# Patient Record
Sex: Female | Born: 1970 | Race: White | Hispanic: No | Smoking: Never smoker
Health system: Southern US, Community
[De-identification: ages and names within clinical notes are randomized; demographics above are authoritative.]

## PROBLEM LIST (undated history)

## (undated) DIAGNOSIS — R519 Headache, unspecified: Secondary | ICD-10-CM

## (undated) DIAGNOSIS — M1990 Unspecified osteoarthritis, unspecified site: Secondary | ICD-10-CM

## (undated) DIAGNOSIS — E079 Disorder of thyroid, unspecified: Secondary | ICD-10-CM

## (undated) DIAGNOSIS — N83209 Unspecified ovarian cyst, unspecified side: Secondary | ICD-10-CM

## (undated) DIAGNOSIS — D649 Anemia, unspecified: Secondary | ICD-10-CM

## (undated) DIAGNOSIS — Z8669 Personal history of other diseases of the nervous system and sense organs: Secondary | ICD-10-CM

## (undated) DIAGNOSIS — E785 Hyperlipidemia, unspecified: Secondary | ICD-10-CM

## (undated) DIAGNOSIS — R160 Hepatomegaly, not elsewhere classified: Secondary | ICD-10-CM

## (undated) DIAGNOSIS — F419 Anxiety disorder, unspecified: Secondary | ICD-10-CM

## (undated) DIAGNOSIS — K219 Gastro-esophageal reflux disease without esophagitis: Secondary | ICD-10-CM

## (undated) HISTORY — PX: HYSTEROSCOPY W/ ENDOMETRIAL ABLATION: SUR665

## (undated) HISTORY — PX: ABDOMINAL HYSTERECTOMY: SHX81

## (undated) HISTORY — DX: Disorder of thyroid, unspecified: E07.9

## (undated) HISTORY — PX: WISDOM TOOTH EXTRACTION: SHX21

## (undated) HISTORY — PX: CARPAL TUNNEL RELEASE: SHX101

## (undated) HISTORY — PX: BACK SURGERY: SHX140

## (undated) HISTORY — PX: OTHER SURGICAL HISTORY: SHX169

## (undated) HISTORY — PX: TUBAL LIGATION: SHX77

## (undated) HISTORY — PX: KNEE SURGERY: SHX244

## (undated) HISTORY — PX: ELBOW SURGERY: SHX618

## (undated) HISTORY — DX: Hyperlipidemia, unspecified: E78.5

## (undated) HISTORY — DX: Personal history of other diseases of the nervous system and sense organs: Z86.69

## (undated) HISTORY — DX: Anxiety disorder, unspecified: F41.9

## (undated) SURGERY — Surgical Case
Anesthesia: *Unknown

---

## 1998-10-28 ENCOUNTER — Emergency Department (HOSPITAL_COMMUNITY): Admission: EM | Admit: 1998-10-28 | Discharge: 1998-10-28 | Payer: Self-pay | Admitting: Emergency Medicine

## 1998-12-24 ENCOUNTER — Emergency Department (HOSPITAL_COMMUNITY): Admission: EM | Admit: 1998-12-24 | Discharge: 1998-12-25 | Payer: Self-pay | Admitting: Emergency Medicine

## 1999-07-23 ENCOUNTER — Encounter: Payer: Self-pay | Admitting: Emergency Medicine

## 1999-07-23 ENCOUNTER — Emergency Department (HOSPITAL_COMMUNITY): Admission: EM | Admit: 1999-07-23 | Discharge: 1999-07-23 | Payer: Self-pay | Admitting: Emergency Medicine

## 2001-02-12 ENCOUNTER — Inpatient Hospital Stay (HOSPITAL_COMMUNITY): Admission: AD | Admit: 2001-02-12 | Discharge: 2001-02-12 | Payer: Self-pay | Admitting: Obstetrics & Gynecology

## 2001-05-12 ENCOUNTER — Emergency Department (HOSPITAL_COMMUNITY): Admission: EM | Admit: 2001-05-12 | Discharge: 2001-05-12 | Payer: Self-pay | Admitting: Emergency Medicine

## 2001-05-12 ENCOUNTER — Encounter: Payer: Self-pay | Admitting: Emergency Medicine

## 2001-05-30 ENCOUNTER — Encounter: Payer: Self-pay | Admitting: Emergency Medicine

## 2001-05-30 ENCOUNTER — Emergency Department (HOSPITAL_COMMUNITY): Admission: EM | Admit: 2001-05-30 | Discharge: 2001-05-30 | Payer: Self-pay

## 2002-09-05 ENCOUNTER — Emergency Department (HOSPITAL_COMMUNITY): Admission: EM | Admit: 2002-09-05 | Discharge: 2002-09-05 | Payer: Self-pay | Admitting: Emergency Medicine

## 2003-01-03 ENCOUNTER — Other Ambulatory Visit: Admission: RE | Admit: 2003-01-03 | Discharge: 2003-01-03 | Payer: Self-pay | Admitting: Obstetrics and Gynecology

## 2004-01-08 ENCOUNTER — Other Ambulatory Visit: Admission: RE | Admit: 2004-01-08 | Discharge: 2004-01-08 | Payer: Self-pay | Admitting: Obstetrics and Gynecology

## 2004-02-05 ENCOUNTER — Encounter: Admission: RE | Admit: 2004-02-05 | Discharge: 2004-02-05 | Payer: Self-pay | Admitting: Orthopaedic Surgery

## 2004-02-25 ENCOUNTER — Encounter: Admission: RE | Admit: 2004-02-25 | Discharge: 2004-02-25 | Payer: Self-pay | Admitting: Orthopaedic Surgery

## 2004-02-28 ENCOUNTER — Emergency Department (HOSPITAL_COMMUNITY): Admission: EM | Admit: 2004-02-28 | Discharge: 2004-02-28 | Payer: Self-pay | Admitting: Emergency Medicine

## 2004-02-28 IMAGING — CT CT HEAD W/O CM
1 series · 16 of 30 positions shown, 20 images · non-contrast
Comparison: none

CLINICAL DATA: Neck pain, fever.  Severe headache, neck stiffness.  Fever for four days.  Pre LP.
CT HEAD WITHOUT CONTRAST
Images likely within normal limits although one might initially get the impression that there is subcortical edema right posterior frontal region.  I feel that this is probably due to the effects of partial volume averaging of CSF in the sulci simulating cortical or subcortical edema.  Minute low density focus, right posterior centrum semiovale may represent dilated perivascular space or potentially a remote lacunar infarction.  If this is a lesion, it may be a result of early small vessel disease, migraines, amongst other possibilities.  
IMPRESSION
No intracranial hemorrhage or hydrocephalus.  Patient is going to have a lumbar puncture.  Query small remote lacunar infarction, right posterior parietal deep white matter.  Mild suspicion for cortical or subcortical edema, right posterior frontal.  Pending the LP findings, consider MRI of the brain for further assessment.

[Series 2: brain · axial · 0.47mm/px · z∈[+136,+261]mm · 16 of 34 slices shown, 20 images]
[im 2/34  brain]
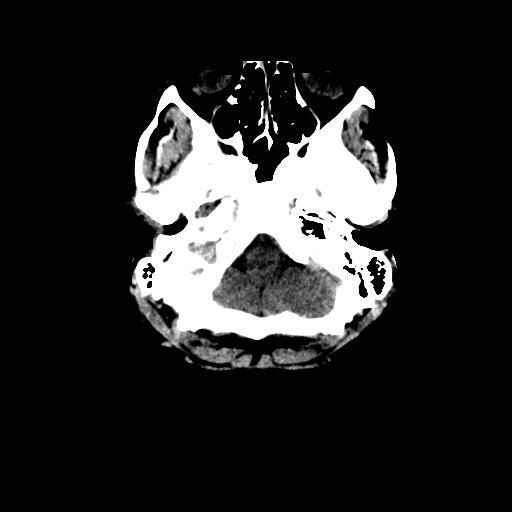
[im 2/34  bone]
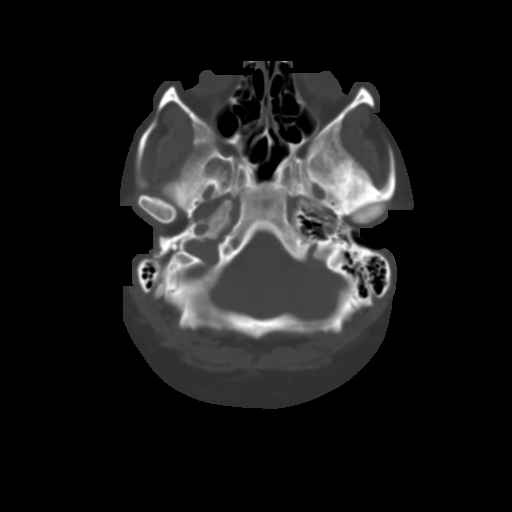
[im 4/34  brain]
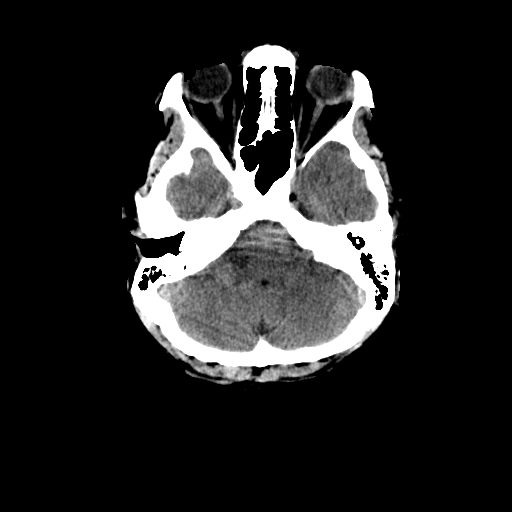
[im 6/34  brain]
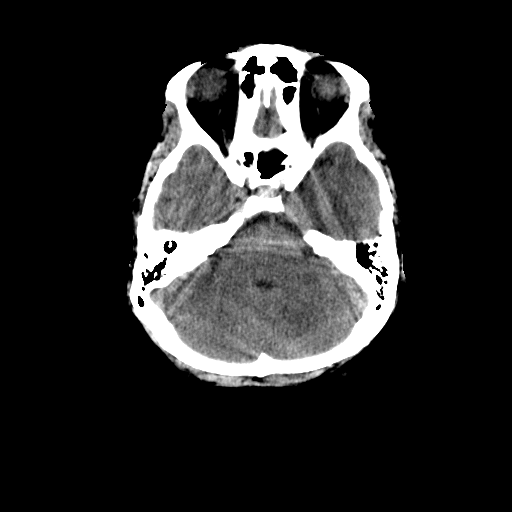
[im 8/34  brain]
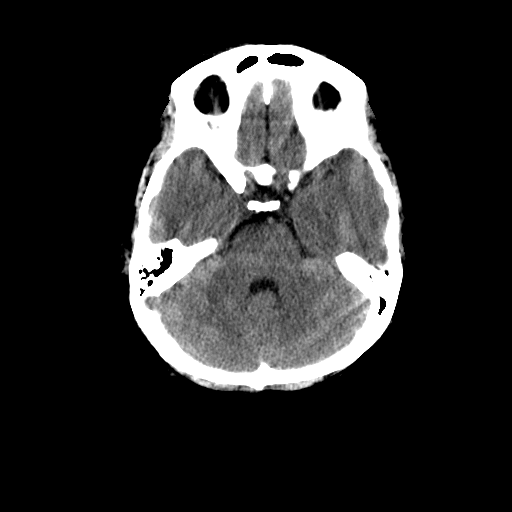
[im 10/34  brain]
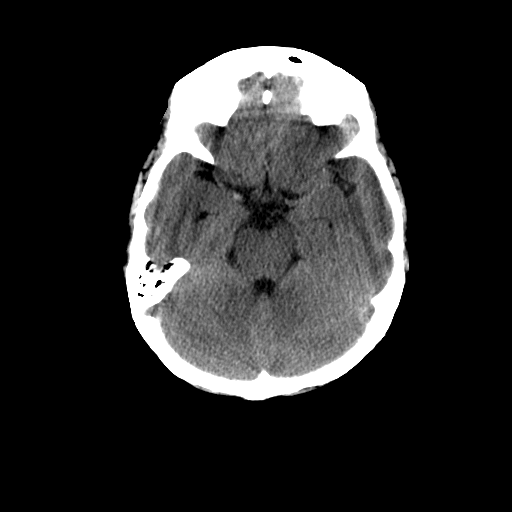
[im 10/34  bone]
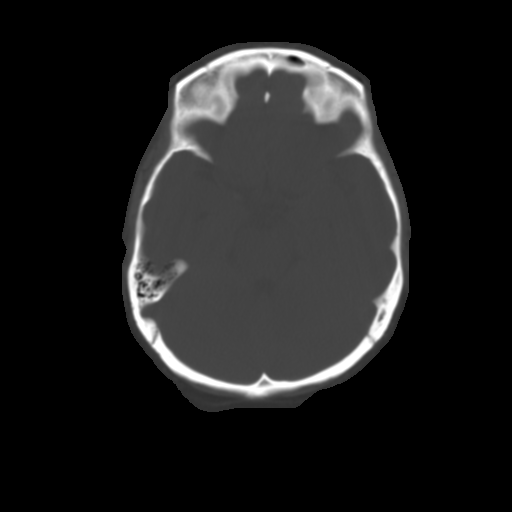
[im 12/34  brain]
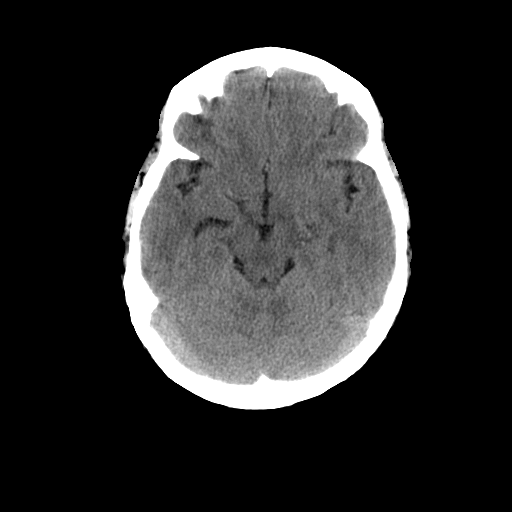
[im 14/34  brain]
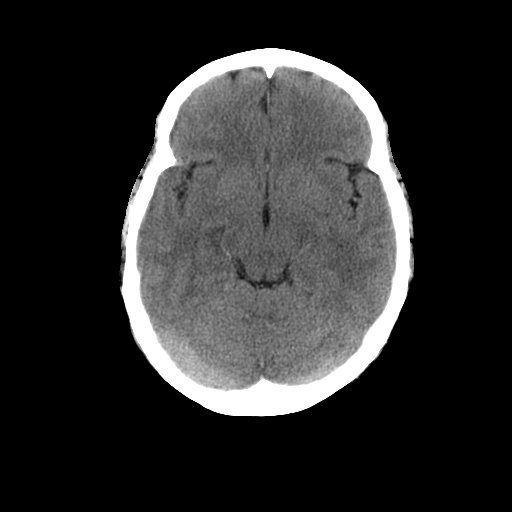
[im 16/34  brain]
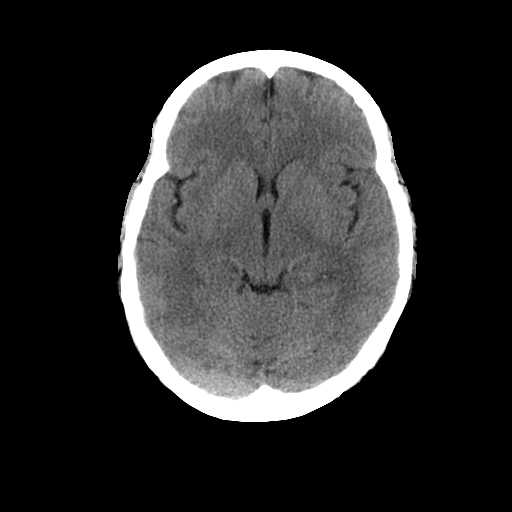
[im 18/34  brain]
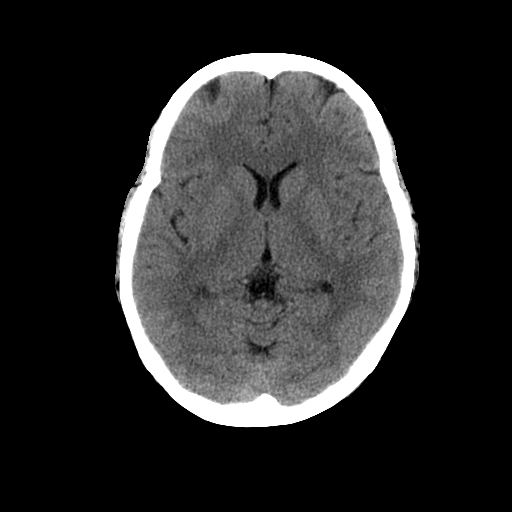
[im 18/34  bone]
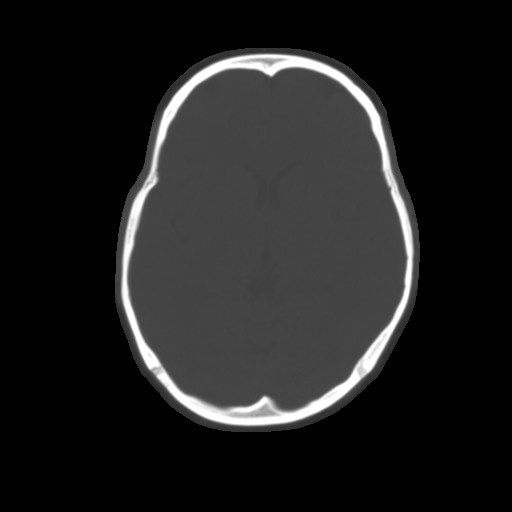
[im 20/34  brain]
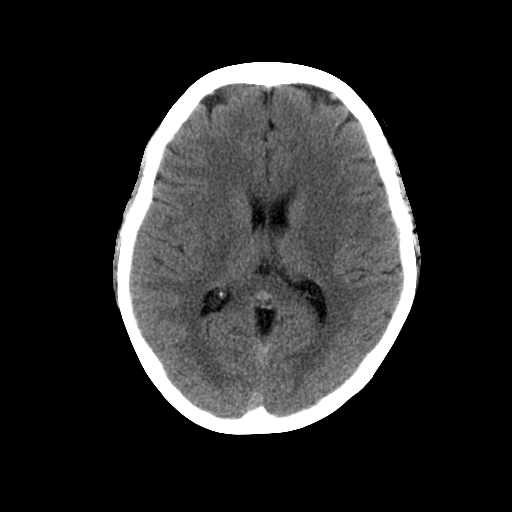
[im 22/34  brain]
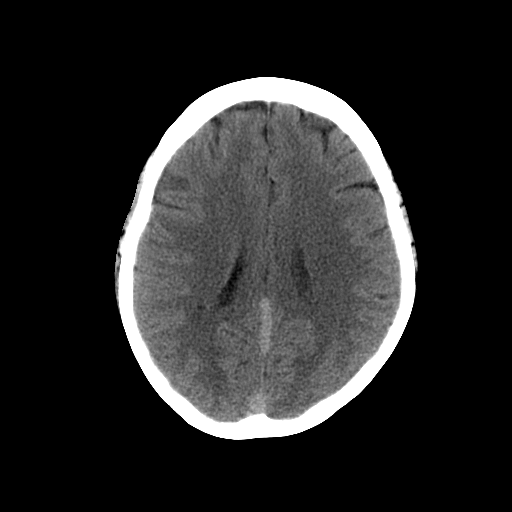
[im 24/34  brain]
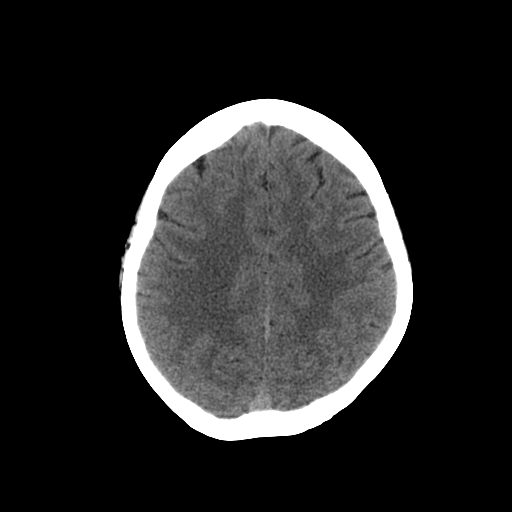
[im 26/34  brain]
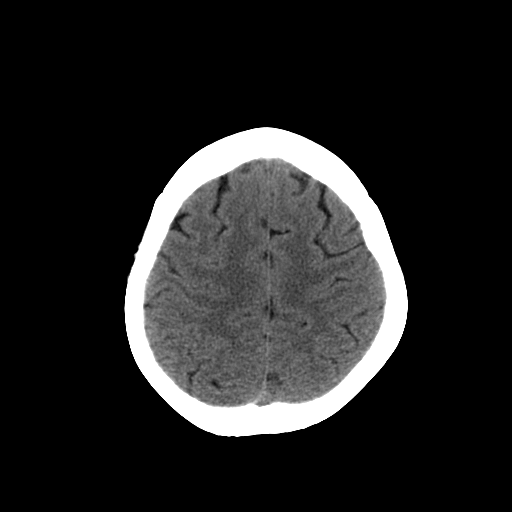
[im 26/34  bone]
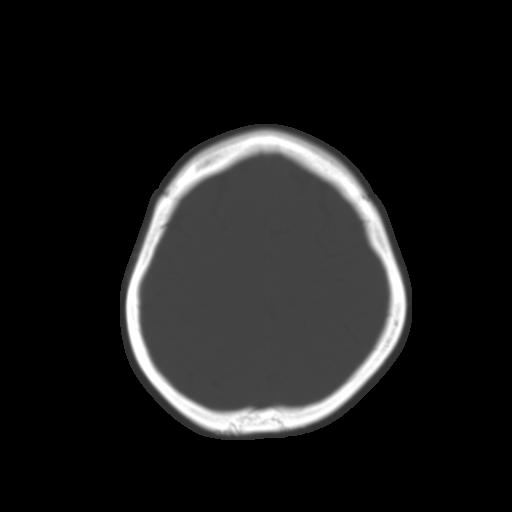
[im 28/34  brain]
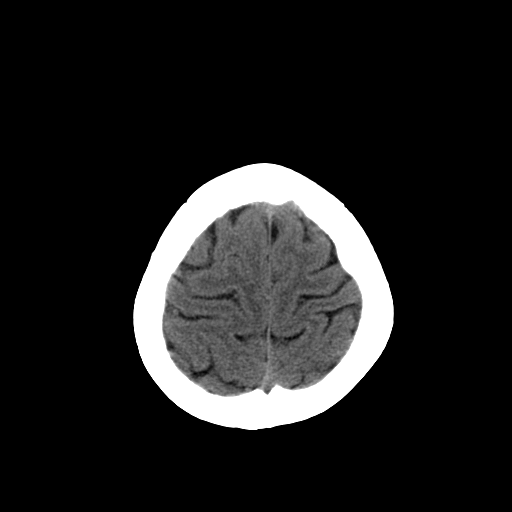
[im 30/34  brain]
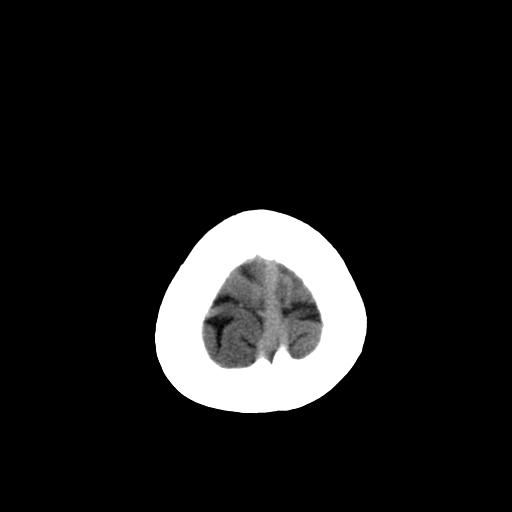
[im 32/34  brain]
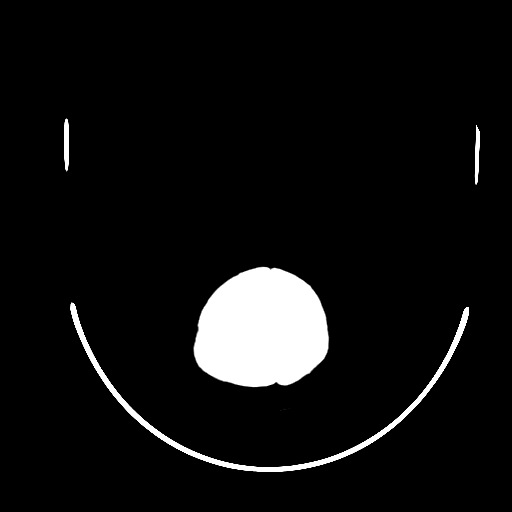

[16 of 30 positions shown; findings below may reference images not displayed]

## 2004-09-17 ENCOUNTER — Ambulatory Visit (HOSPITAL_COMMUNITY): Admission: RE | Admit: 2004-09-17 | Discharge: 2004-09-17 | Payer: Self-pay | Admitting: Orthopaedic Surgery

## 2004-09-24 ENCOUNTER — Encounter: Admission: RE | Admit: 2004-09-24 | Discharge: 2004-09-24 | Payer: Self-pay | Admitting: Orthopaedic Surgery

## 2004-09-24 IMAGING — RF DG DISKOGRAPHY LUMBAR S+I
7 series · 7 of 7 positions shown · IV contrast (omnipaque)
Comparison: none

CLINICAL DATA: Back pain.
LUMBAR DISKOGRAPHY AND POST-DISCOGRAM CT:
LUMBAR DISKOGRAM
The patient was given extensive informed consent including the risk of pain, infection, spinal fluid leak, and neurologic deficit.  Specifically, the risk of infection including diskitis and osteomyelitis was discussed with the patient, who agreed to proceed.  
The patient was given 1 gram Ancef IV thirty minutes prior to the procedure.  1 cc of Ancef was added to 20 cc of Omnipaque 180 used for injection. The back was prepared with a sterile scrub sponge for five minutes followed by copious application of Betadine solution.  Sterile drapes were applied and strict sterile technique was used by everyone in the room.  A right paraspinous approach was taken. 20 cm needles were employed both at L4-5 and L5-S1 due to the patient?s morbid obesity.  Individual disk spaces were examined as follows:
L4-5:  Opening pressure 10 PSI.  Pressure at pain response 50 PSI.  Quantity of contrast 3 ml.  Level of pain was [DATE] on a scale of 1 to 10.  She experienced pain extending from her low back into her right side. This was similar to the pain she experiences at home.  
L5-S1:  Opening pressure 50 PSI.  Pressure at pain response 50 PSI.  Quantity of contrast 3 ml.  Level of pain was 10 on a scale of 1 to 10.  She experienced pain extending into her right leg and calf.  This is different from what she experiences at home.  
The disk at L4-5 was morphologically abnormal extending from with an annular rent extending posteriorly into the canal as well as anteriorly into retroperitoneum. The disk at L5-S1 appeared relatively  normal morphologically.

[Series 1: discogram · 1 of 1 slices shown (1 of 7)]
[im 1/1]
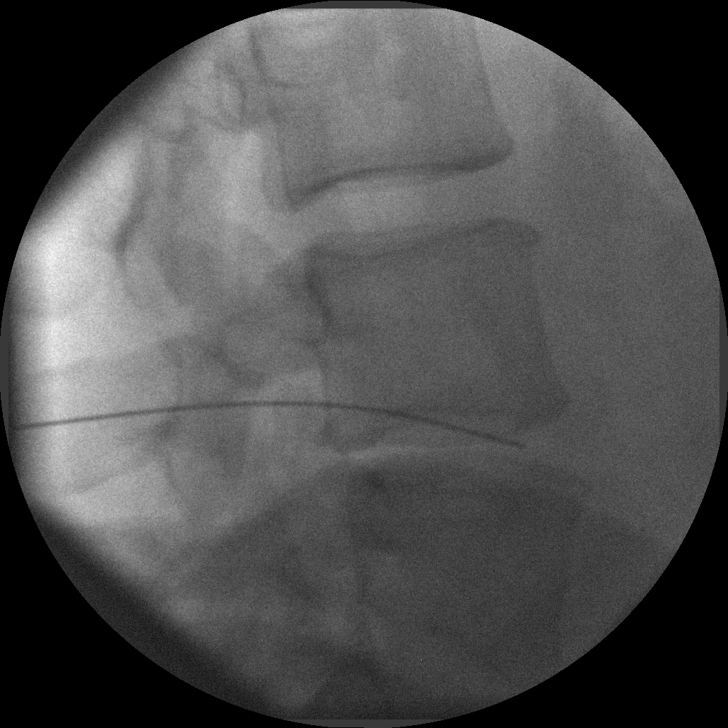

[Series 2: discogram · 1 of 1 slices shown (2 of 7)]
[im 1/1]
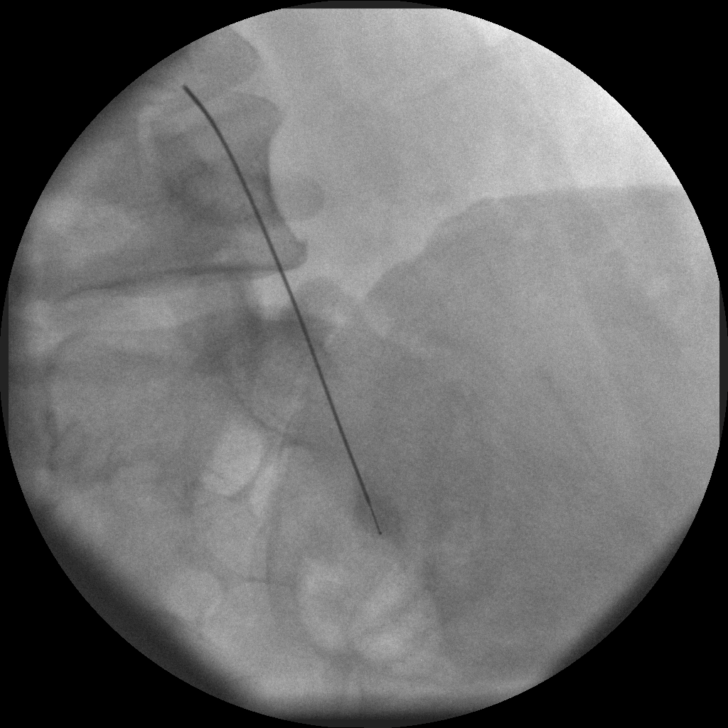

[Series 5: discogram · 1 of 1 slices shown (3 of 7)]
[im 1/1]
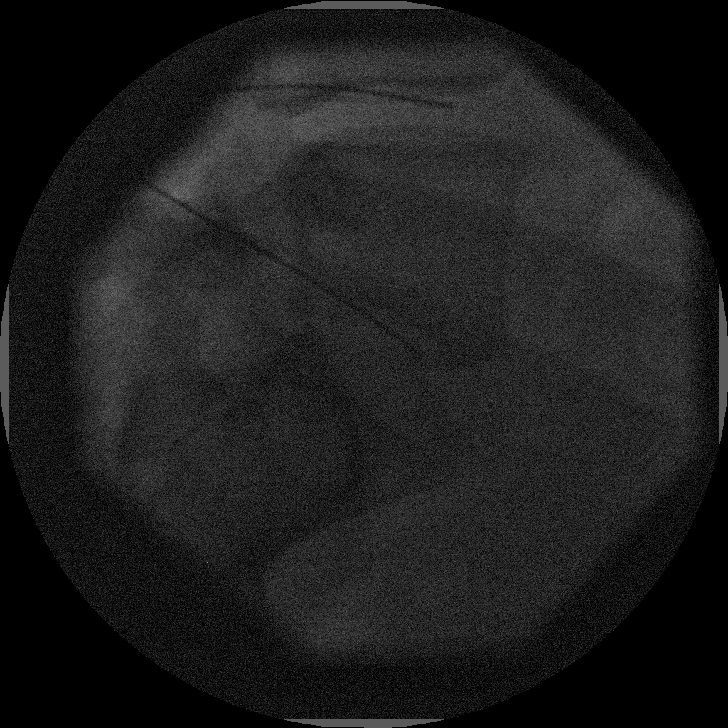

[Series 7: discogram · 1 of 1 slices shown (4 of 7)]
[im 1/1]
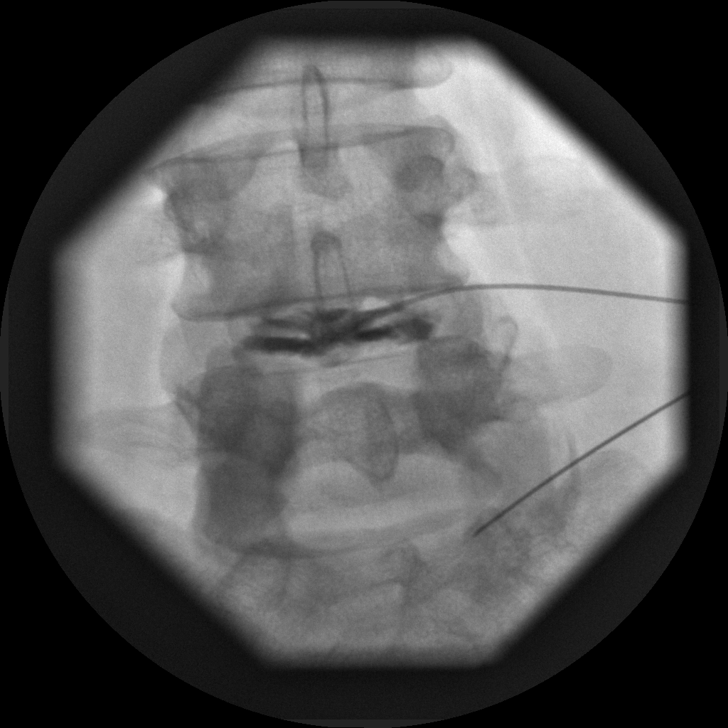

[Series 8: discogram · 1 of 1 slices shown (5 of 7)]
[im 1/1]
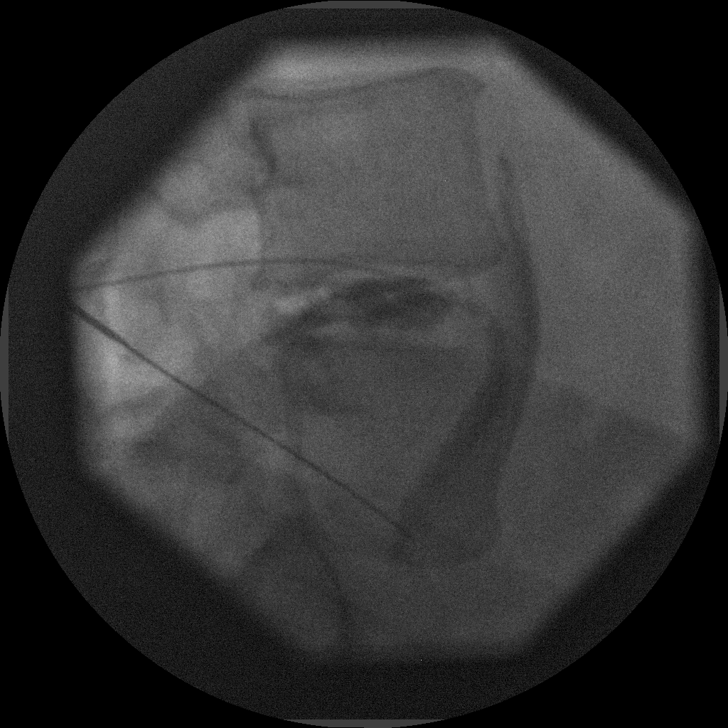

[Series 9: discogram · 1 of 1 slices shown (6 of 7)]
[im 1/1]
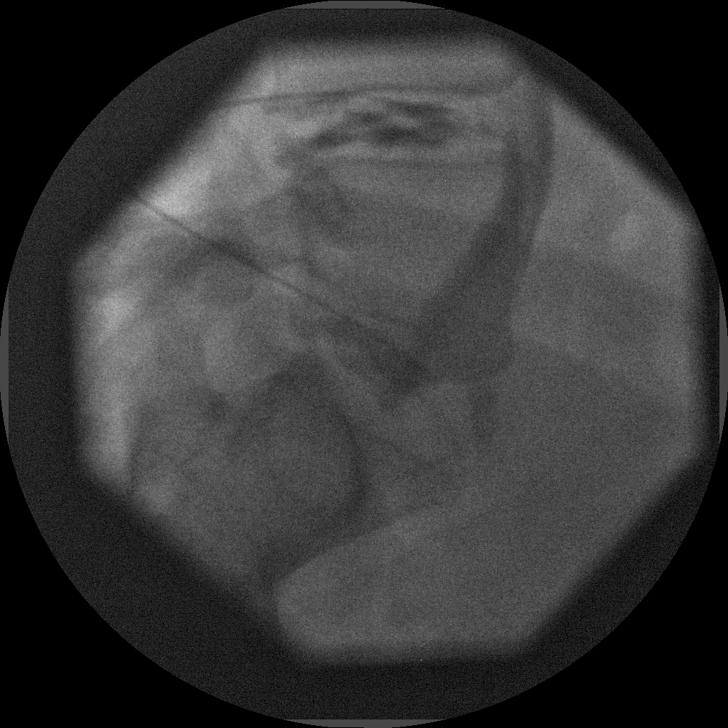

[Series 10: discogram · 1 of 1 slices shown (7 of 7)]
[im 1/1]
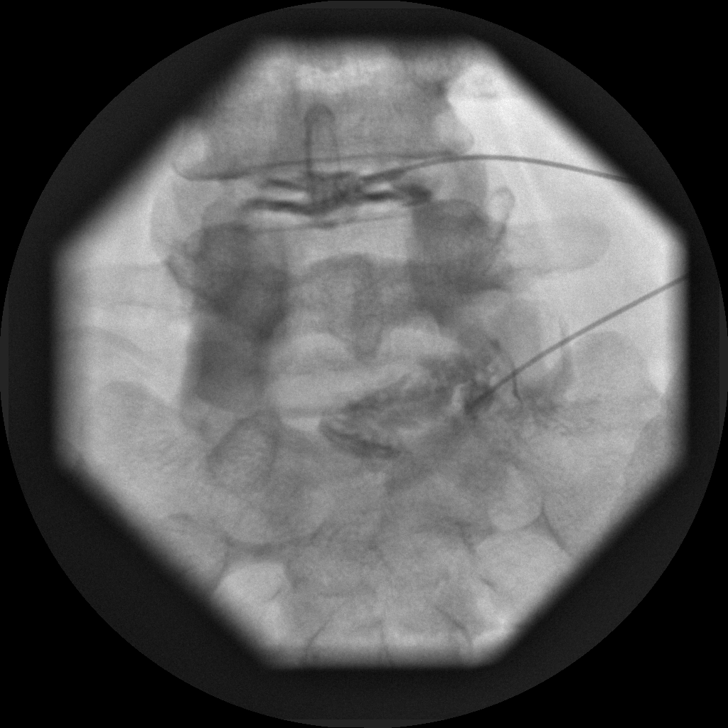

[7 of 7 positions shown; findings below may reference images not displayed]

IMPRESSION: 1.  Lumbar diskogram at L4-5 reproduced back and right hip pain although it did not reproduce back and left leg pain.  
2.  There was discordant pain at L5-S1 with some radiation to the right calf. 
POST DISKOGRAM CT:  
L4-5:  There is annular rent central and to the right associated with vacuum disk phenomenon.  Right L4 and right L5 nerve root encroachment are present.  Contrast extrusion into the retroperitoneum is seen posterior to the aorta. 
L5-S1:  There is some annular filling which is probably related to needle placement which is fairly difficult in this patient.  Central nuclear material, however, shows no obvious annular rent. 
There is disk space narrowing and osteophyte formation at L4-5. There may be very mild anterolisthesis of L5 forward on L4 and S1.  Far right and left parasagittal images suggest a pars interarticularis defect at L5 on the left.  There is also moderate facet arthropathy which most likely accounts for the patient?s mild slip.
IMPRESSION: 1.  ositive lumbar diskography at L4-5 with reproduction of the patient?s back and right hip pain; there is contrast extrusion into the canal and into foramen with right L4 and right L5 nerve root encroachment.
2.  The L5-S1 disk appears relatively normal.  
  Lower lumbar facet arthropathy worst at L4-5 associated with a minimal pars defect at L5 on the left. 
CT MULTIPLANAR RECONSTRUCTION:
Multiplanar reformatted CT images were reconstructed from the axial CT data set. These images were reviewed and pertinent findings are included in the accompanying complete CT report. 

IMPRESSION

See complete CT report.

## 2004-09-24 IMAGING — CT CT RECONSTRUCTION
3 of 7 series · 12 of 33 positions shown, 14 images · IV contrast (omnipaque)
Comparison: none

CLINICAL DATA: Back pain.
LUMBAR DISKOGRAPHY AND POST-DISCOGRAM CT:
LUMBAR DISKOGRAM
The patient was given extensive informed consent including the risk of pain, infection, spinal fluid leak, and neurologic deficit.  Specifically, the risk of infection including diskitis and osteomyelitis was discussed with the patient, who agreed to proceed.  
The patient was given 1 gram Ancef IV thirty minutes prior to the procedure.  1 cc of Ancef was added to 20 cc of Omnipaque 180 used for injection. The back was prepared with a sterile scrub sponge for five minutes followed by copious application of Betadine solution.  Sterile drapes were applied and strict sterile technique was used by everyone in the room.  A right paraspinous approach was taken. 20 cm needles were employed both at L4-5 and L5-S1 due to the patient?s morbid obesity.  Individual disk spaces were examined as follows:
L4-5:  Opening pressure 10 PSI.  Pressure at pain response 50 PSI.  Quantity of contrast 3 ml.  Level of pain was [DATE] on a scale of 1 to 10.  She experienced pain extending from her low back into her right side. This was similar to the pain she experiences at home.  
L5-S1:  Opening pressure 50 PSI.  Pressure at pain response 50 PSI.  Quantity of contrast 3 ml.  Level of pain was 10 on a scale of 1 to 10.  She experienced pain extending into her right leg and calf.  This is different from what she experiences at home.  
The disk at L4-5 was morphologically abnormal extending from with an annular rent extending posteriorly into the canal as well as anteriorly into retroperitoneum. The disk at L5-S1 appeared relatively  normal morphologically.

[Series 4: recon 3: l-spine helical · axial · 0.27mm/px · z∈[-47,+1]mm · 4 of 65 slices shown, 5 images]
[im 13/65  soft-tissue]
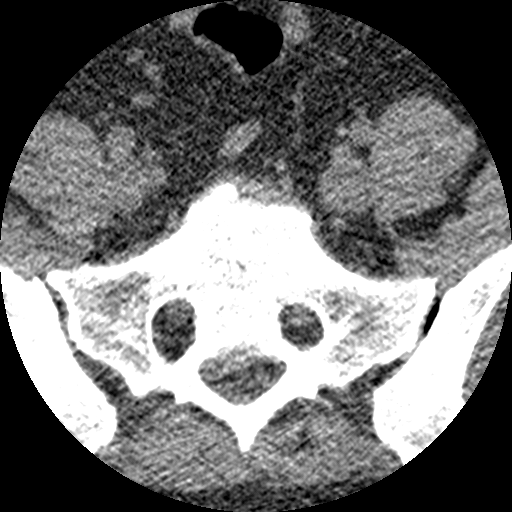
[im 13/65  bone]
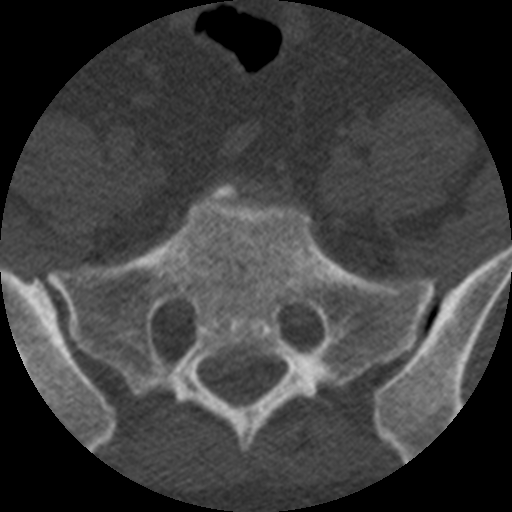
[im 26/65  bone]
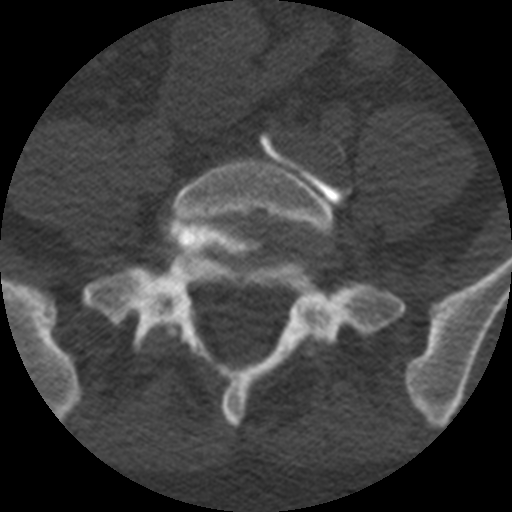
[im 39/65  bone]
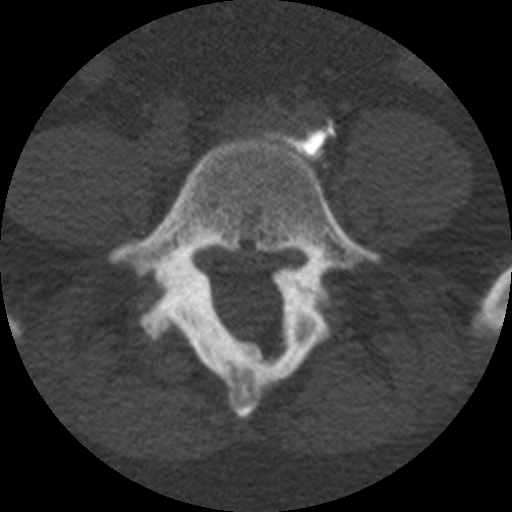
[im 52/65  bone]
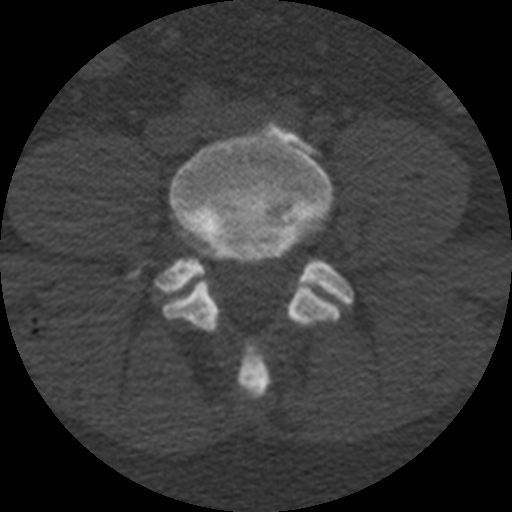

[Series 400: reformatted · sagittal · 0.23mm/px · 5 of 40 slices shown, 6 images (1 of 2)]
[im 14/40  bone]
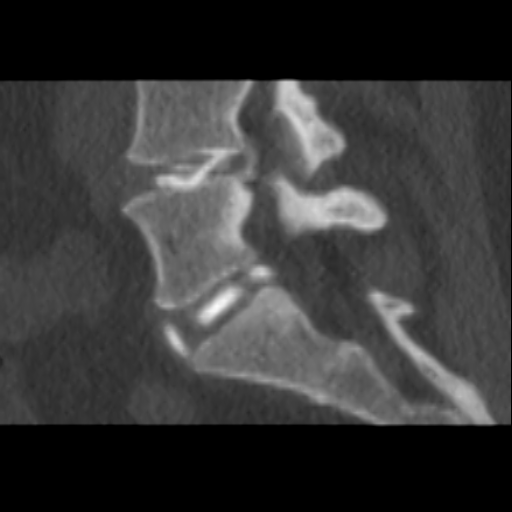
[im 17/40  bone]
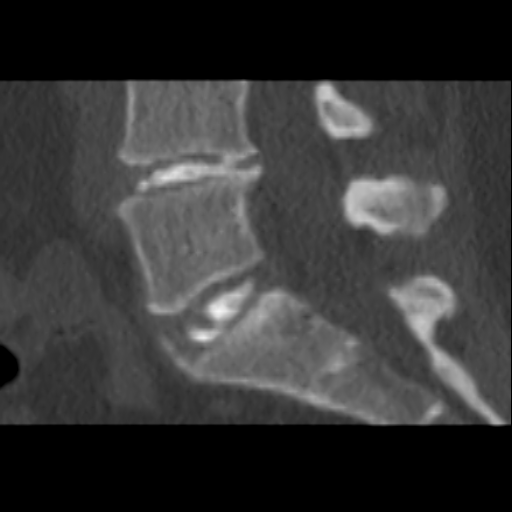
[im 20/40  soft-tissue]
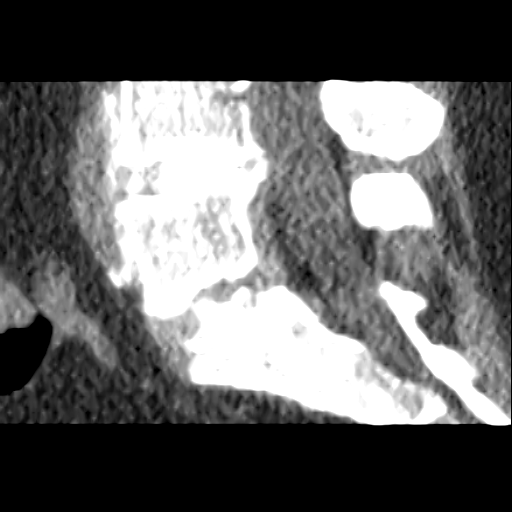
[im 20/40  bone]
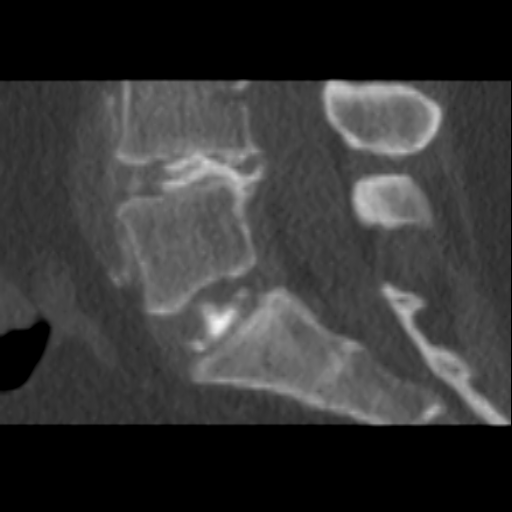
[im 23/40  bone]
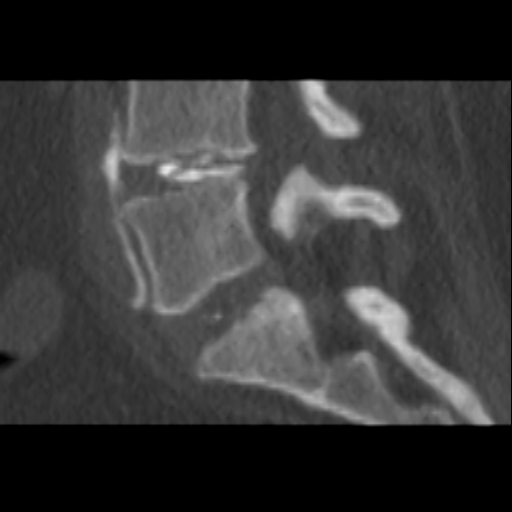
[im 27/40  bone]
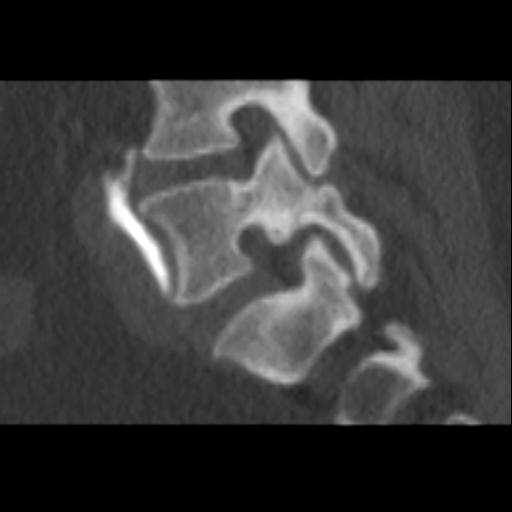

[Series 401: reformatted · coronal · 0.27mm/px · 3 of 40 slices shown (2 of 2)]
[im 8/40  bone]
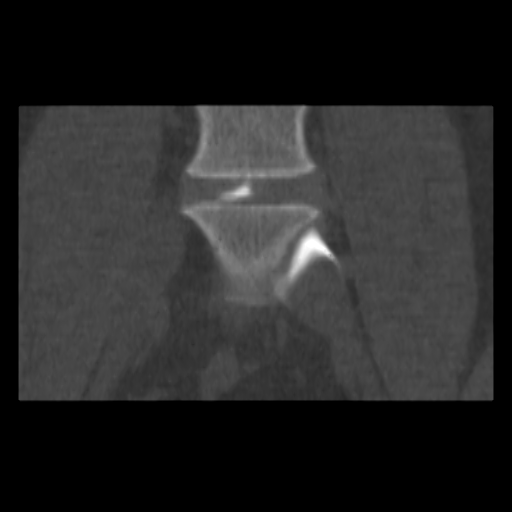
[im 16/40  bone]
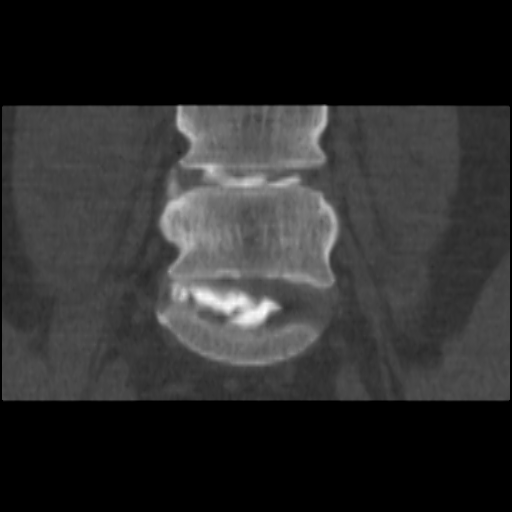
[im 24/40  bone]
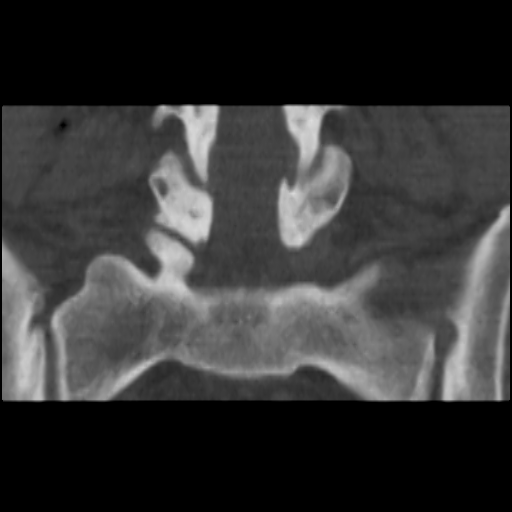

[12 of 33 positions shown; findings below may reference images not displayed]

IMPRESSION: 1.  Lumbar diskogram at L4-5 reproduced back and right hip pain although it did not reproduce back and left leg pain.  
2.  There was discordant pain at L5-S1 with some radiation to the right calf. 
POST DISKOGRAM CT:  
L4-5:  There is annular rent central and to the right associated with vacuum disk phenomenon.  Right L4 and right L5 nerve root encroachment are present.  Contrast extrusion into the retroperitoneum is seen posterior to the aorta. 
L5-S1:  There is some annular filling which is probably related to needle placement which is fairly difficult in this patient.  Central nuclear material, however, shows no obvious annular rent. 
There is disk space narrowing and osteophyte formation at L4-5. There may be very mild anterolisthesis of L5 forward on L4 and S1.  Far right and left parasagittal images suggest a pars interarticularis defect at L5 on the left.  There is also moderate facet arthropathy which most likely accounts for the patient?s mild slip.
IMPRESSION: 1.  ositive lumbar diskography at L4-5 with reproduction of the patient?s back and right hip pain; there is contrast extrusion into the canal and into foramen with right L4 and right L5 nerve root encroachment.
2.  The L5-S1 disk appears relatively normal.  
  Lower lumbar facet arthropathy worst at L4-5 associated with a minimal pars defect at L5 on the left. 
CT MULTIPLANAR RECONSTRUCTION:
Multiplanar reformatted CT images were reconstructed from the axial CT data set. These images were reviewed and pertinent findings are included in the accompanying complete CT report. 

IMPRESSION

See complete CT report.

## 2004-10-17 ENCOUNTER — Emergency Department (HOSPITAL_COMMUNITY): Admission: EM | Admit: 2004-10-17 | Discharge: 2004-10-17 | Payer: Self-pay | Admitting: Emergency Medicine

## 2005-01-11 ENCOUNTER — Other Ambulatory Visit: Admission: RE | Admit: 2005-01-11 | Discharge: 2005-01-11 | Payer: Self-pay | Admitting: Obstetrics and Gynecology

## 2005-11-05 ENCOUNTER — Emergency Department (HOSPITAL_COMMUNITY): Admission: EM | Admit: 2005-11-05 | Discharge: 2005-11-05 | Payer: Self-pay | Admitting: Emergency Medicine

## 2006-01-21 ENCOUNTER — Emergency Department: Payer: Self-pay | Admitting: Emergency Medicine

## 2006-01-21 IMAGING — CR RIGHT ANKLE - COMPLETE 3+ VIEW
1 series · 5 of 5 positions shown · non-contrast
Comparison: none

REASON FOR EXAM: injury
COMMENTS:

PROCEDURE:     DXR - DXR ANKLE RIGHT COMPLETE  - [DATE]  [DATE]
RESULT:     There does not appear to be evidence of fracture, dislocation,
or malalignment. No soft tissue swelling is appreciated.

[Series 1: view not recorded · 0.17mm/px · 5 of 5 slices shown]
[im 1/5]
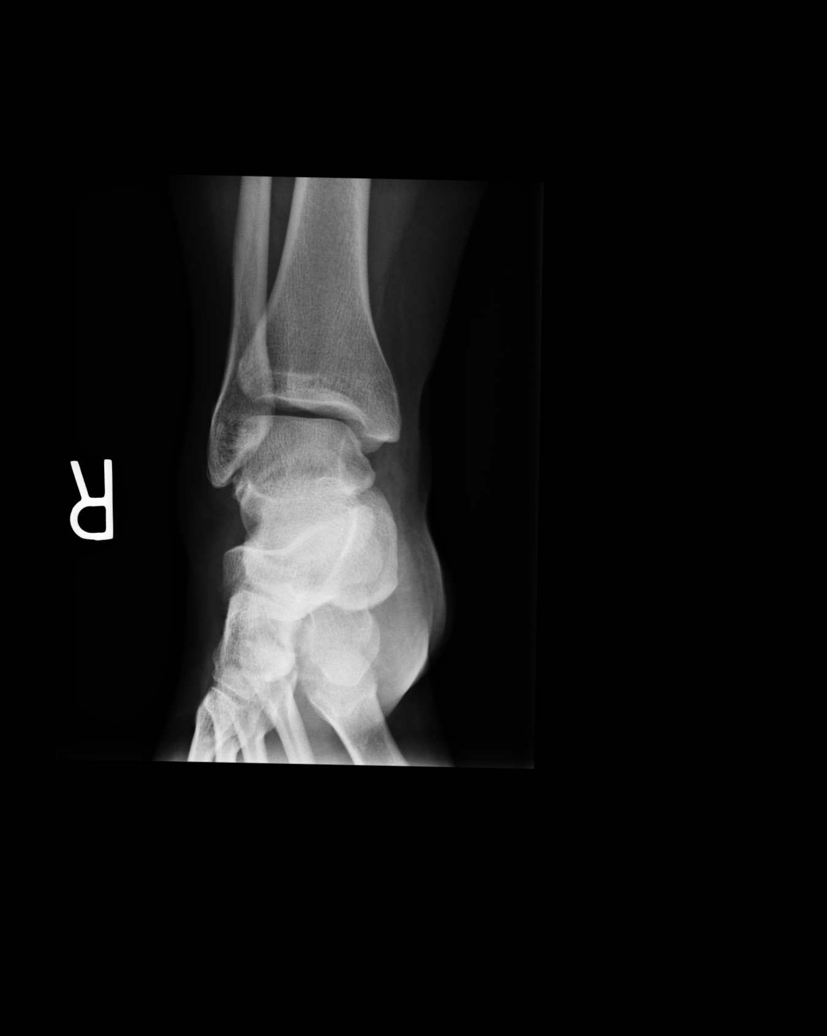
[im 2/5]
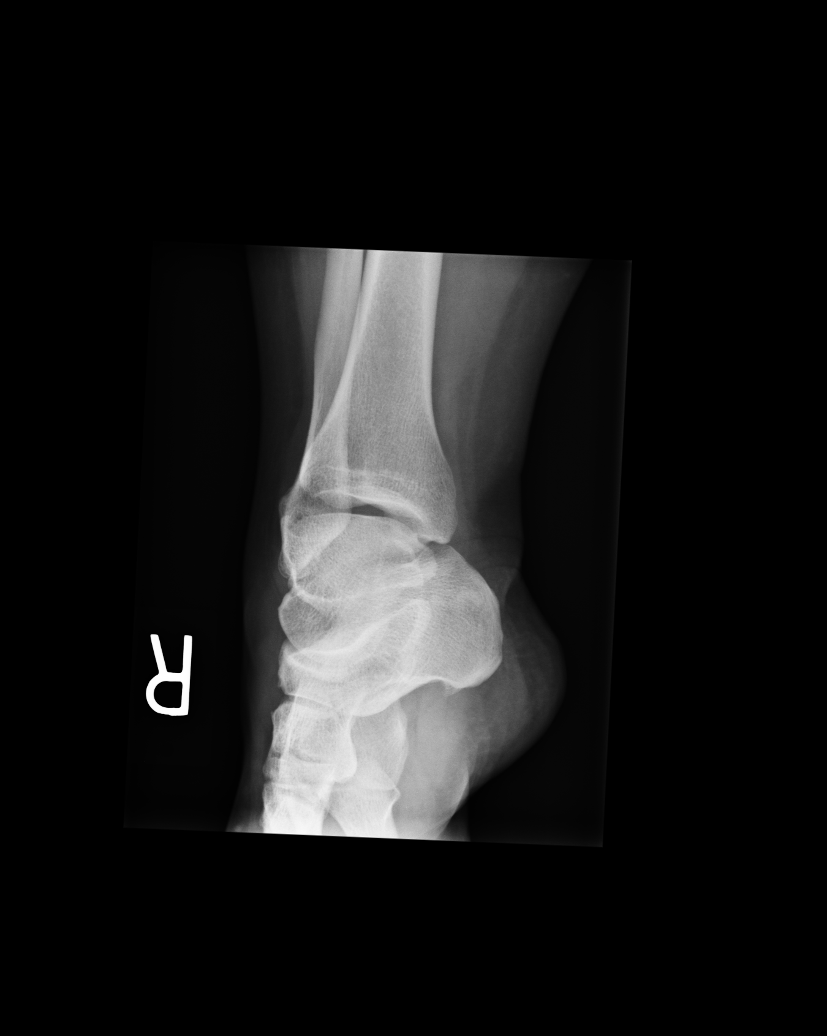
[im 3/5]
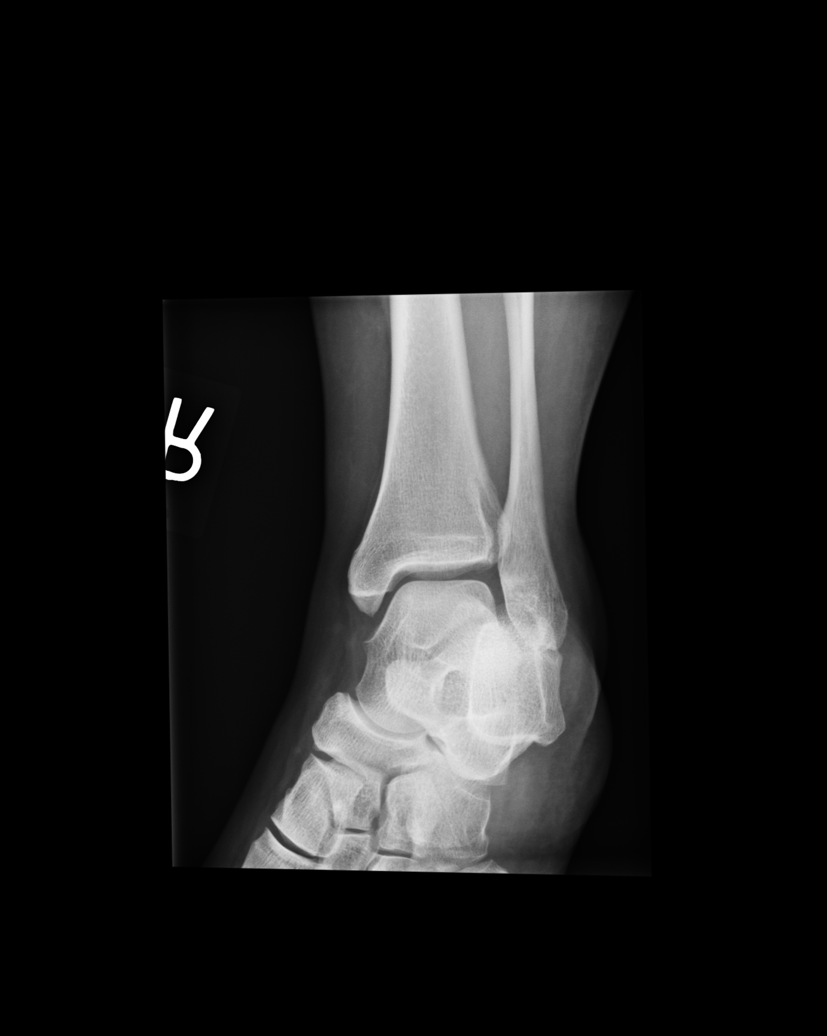
[im 4/5]
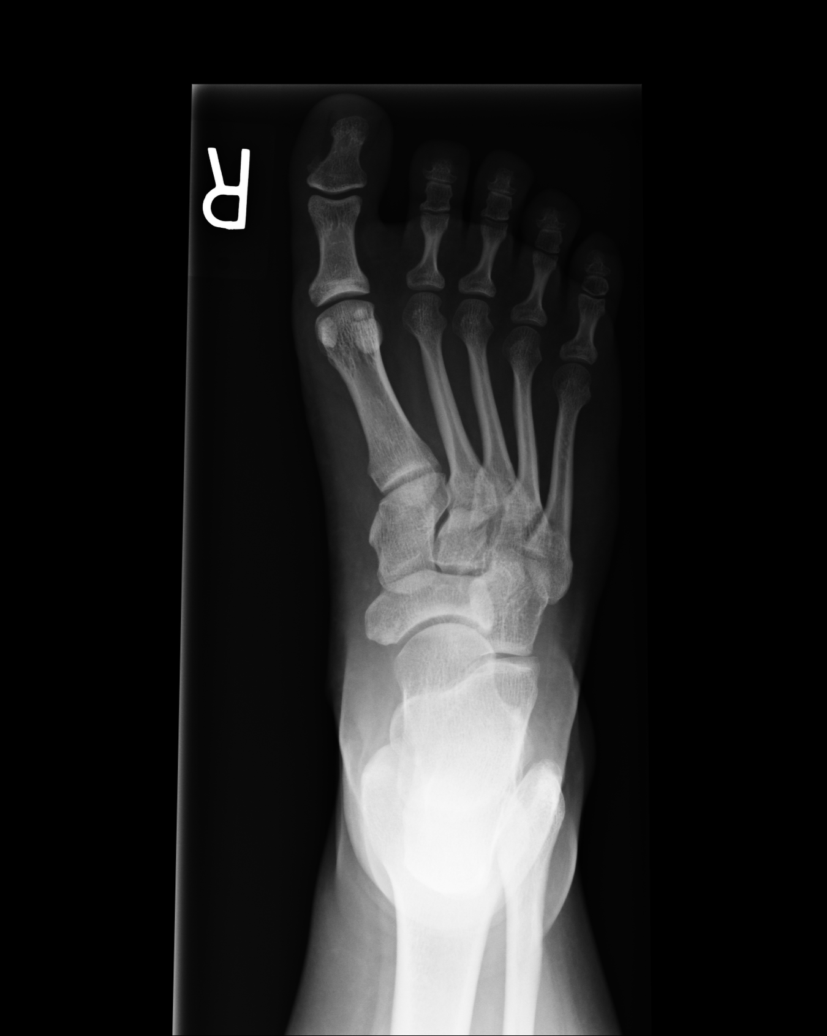
[im 5/5]
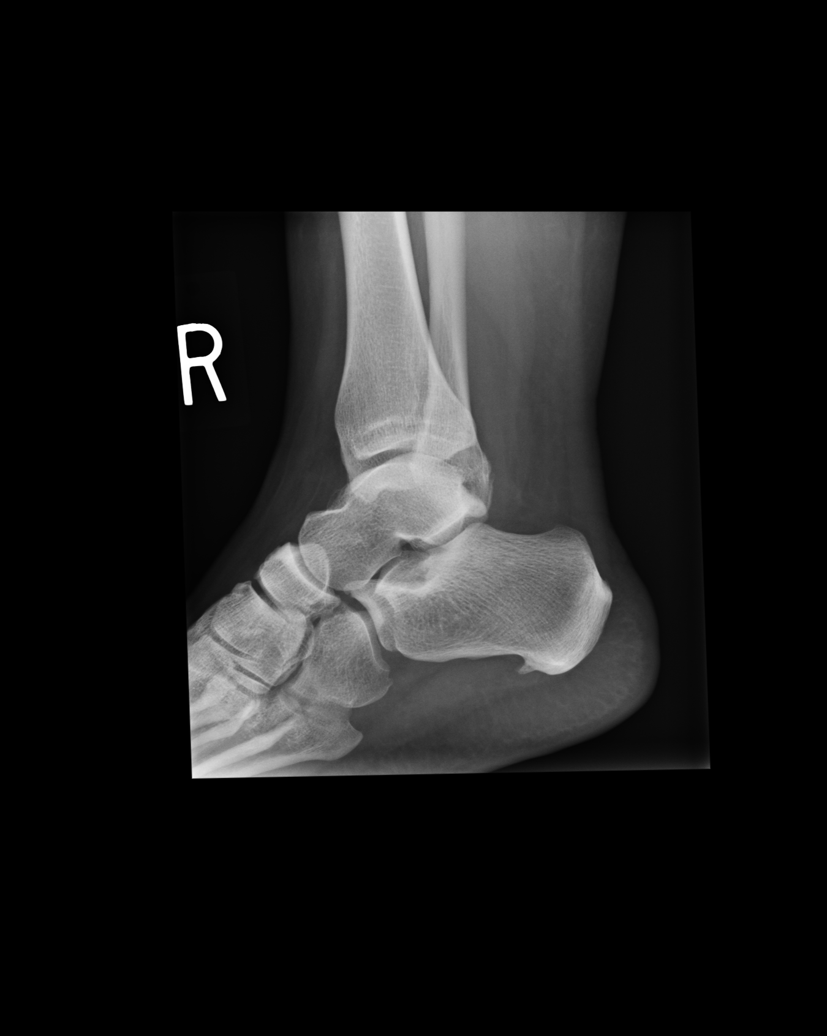

[5 of 5 positions shown; findings below may reference images not displayed]

IMPRESSION: 1)No evidence of acute abnormalities.

2)If there is persistent clinical concern or persistent complaints of pain,
repeat evaluation in 7-10 days is recommended if clinically warranted.

## 2007-01-12 ENCOUNTER — Emergency Department (HOSPITAL_COMMUNITY): Admission: EM | Admit: 2007-01-12 | Discharge: 2007-01-12 | Payer: Self-pay | Admitting: Emergency Medicine

## 2007-01-12 IMAGING — CT CT HEAD W/O CM
5 of 6 series · 18 of 47 positions shown, 19 images · IV contrast (agent unspecified)
Comparison: [DATE].
COMPARISON: No comparison films available.

CLINICAL DATA: Left head and neck injury with pain.  
 HEAD CT WITHOUT CONTRAST:
TECHNIQUE: Contiguous axial images were obtained from the base of the skull through the vertex according to standard protocol without contrast.
TECHNIQUE: Multidetector CT imaging of the cervical spine was performed.  Multiplanar CT image reconstructions were also generated.

[Series 3: head trauma 4.8 h47s · axial · 0.41mm/px · z∈[-180,-32]mm · 3 of 30 slices shown, 4 images]
[im 1/30  brain]
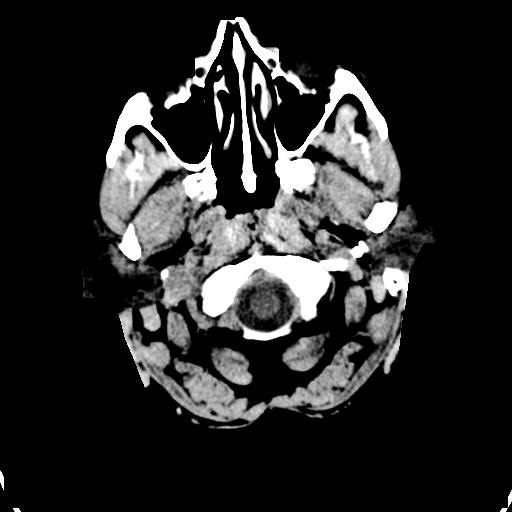
[im 1/30  bone]
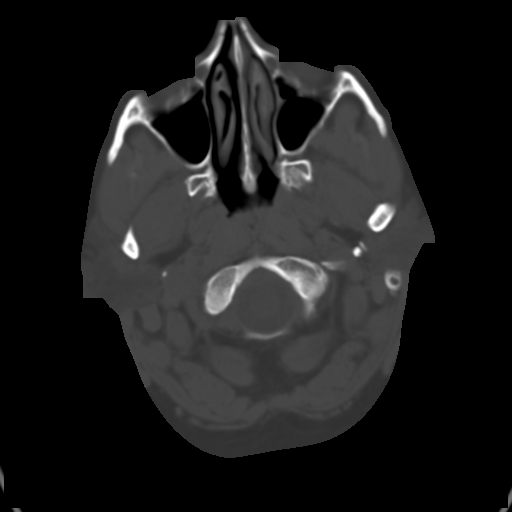
[im 15/30  brain]
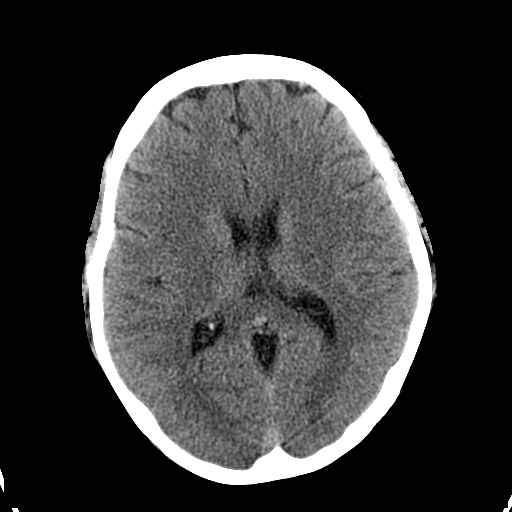
[im 30/30  brain]
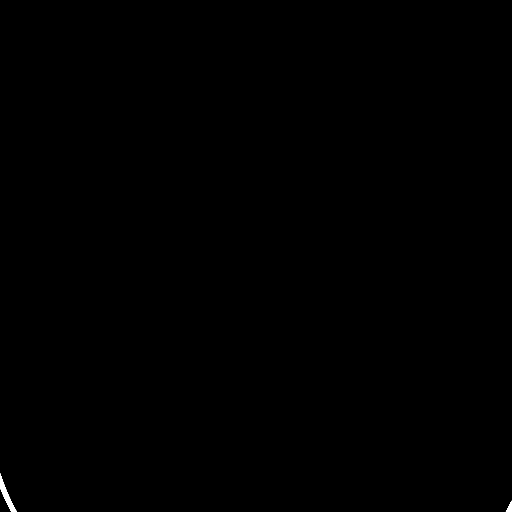

[Series 602: orthogonal bone · axial · 0.38mm/px · z∈[-354,-332]mm · 2 of 80 slices shown]
[im 12/80  bone]
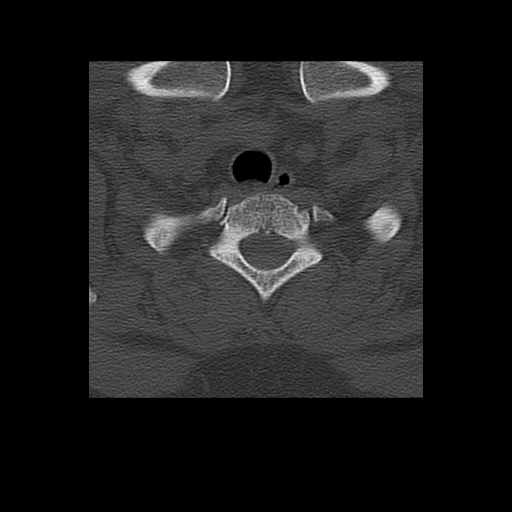
[im 23/80  bone]
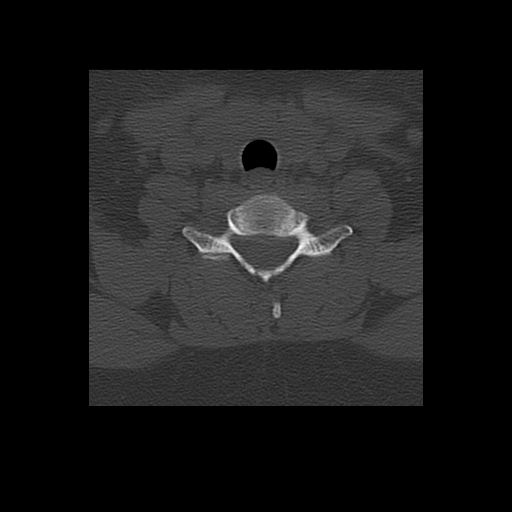

[Series 605: orthogonal detail · axial · 0.38mm/px · z∈[-353,-227]mm · 7 of 85 slices shown]
[im 11/85  bone]
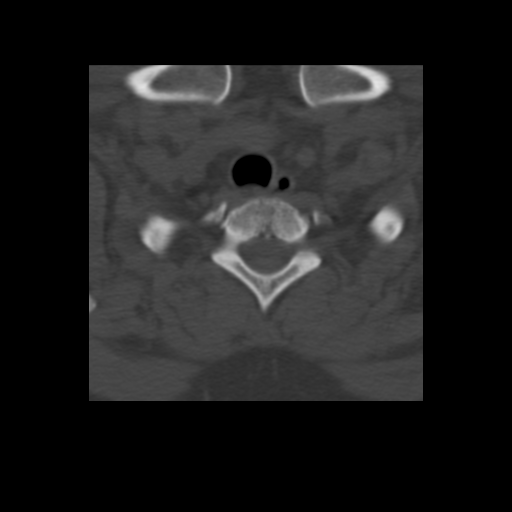
[im 22/85  bone]
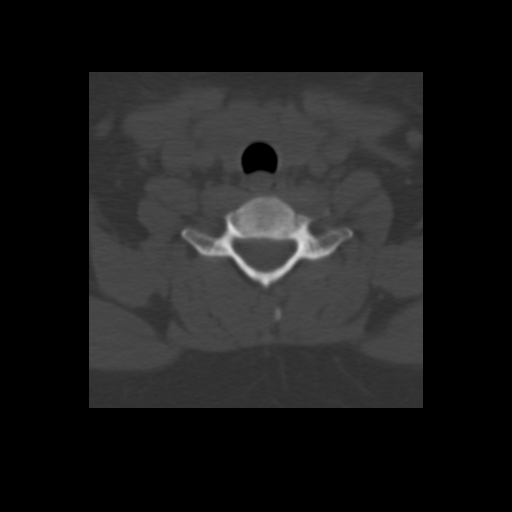
[im 32/85  bone]
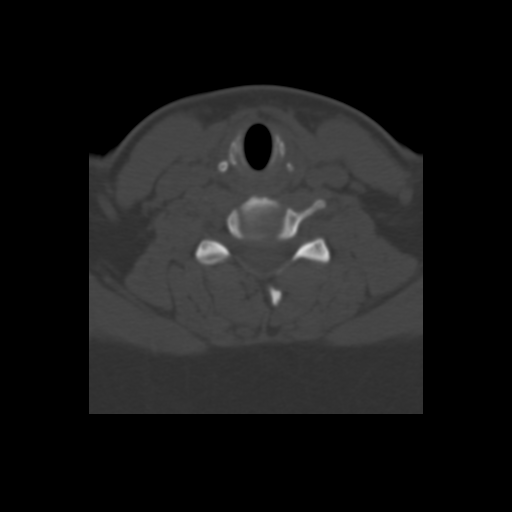
[im 43/85  bone]
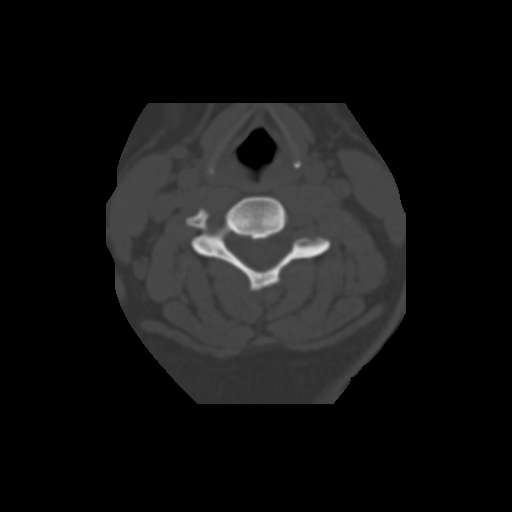
[im 53/85  bone]
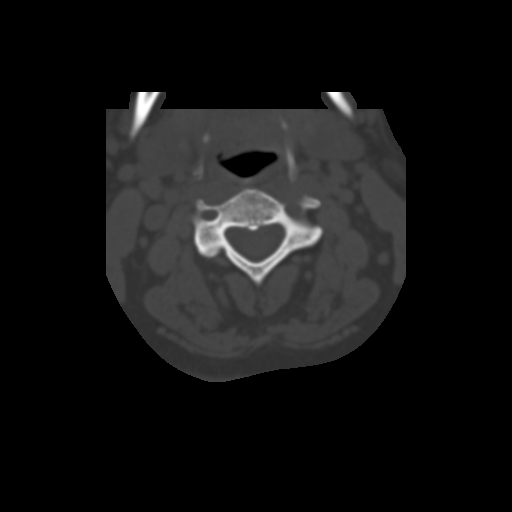
[im 64/85  bone]
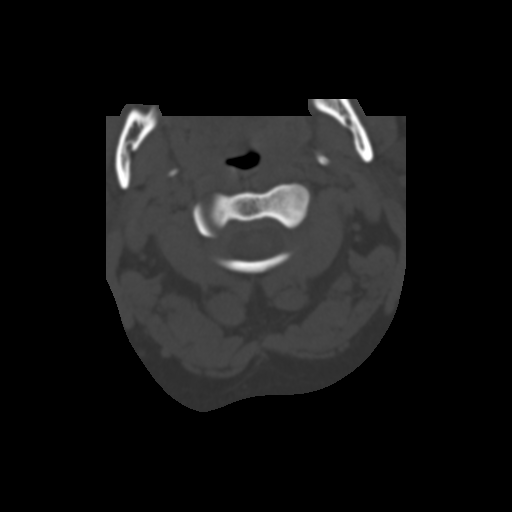
[im 74/85  bone]
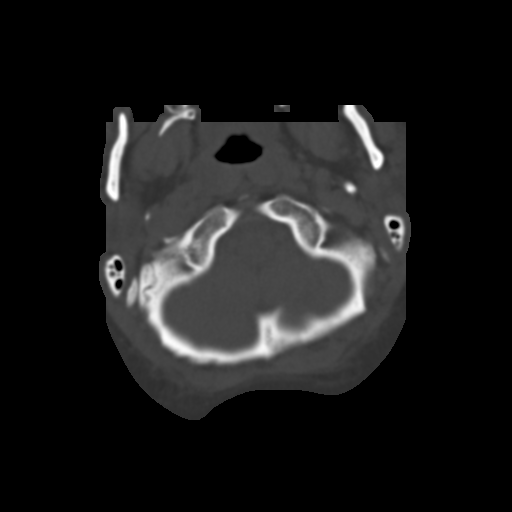

[Series 606: coronal detail · coronal · 0.38mm/px · 3 of 48 slices shown]
[im 16/48  brain]
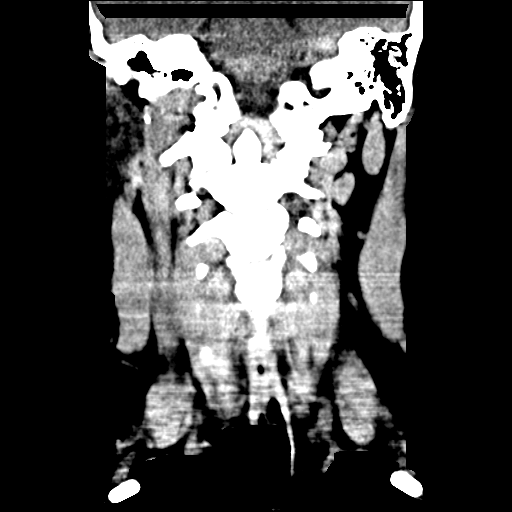
[im 21/48  brain]
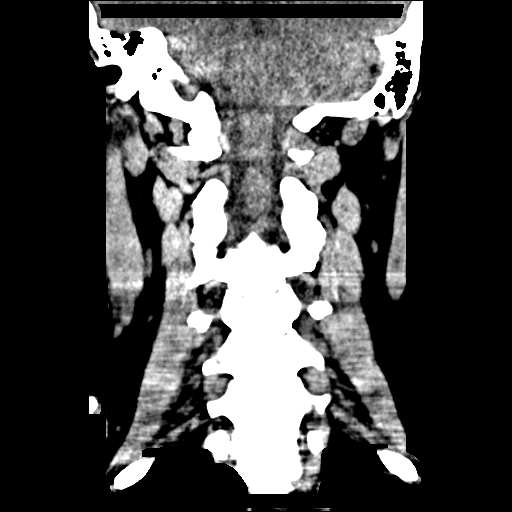
[im 27/48  brain]
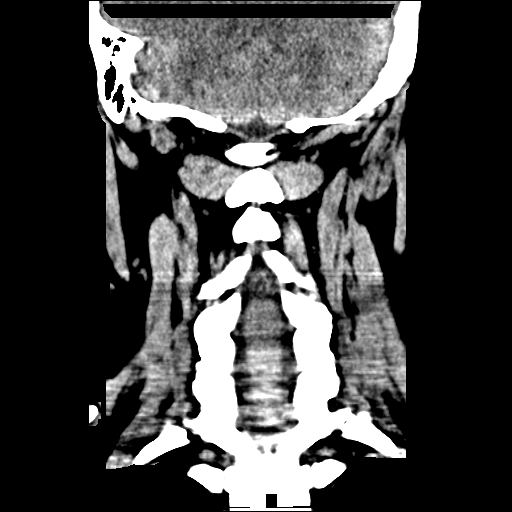

[Series 607: sagittal detail · sagittal · 0.38mm/px · 3 of 45 slices shown]
[im 15/45  brain]
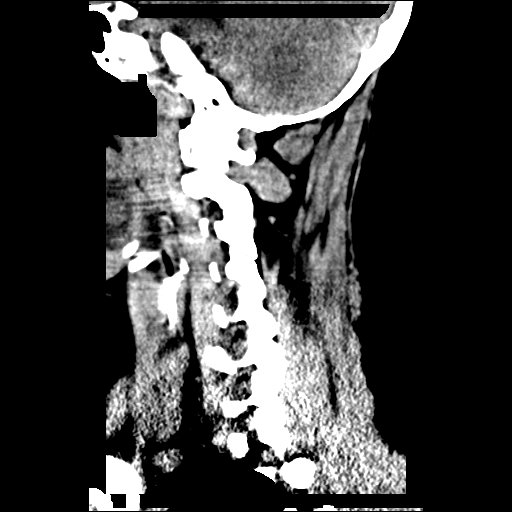
[im 23/45  brain]
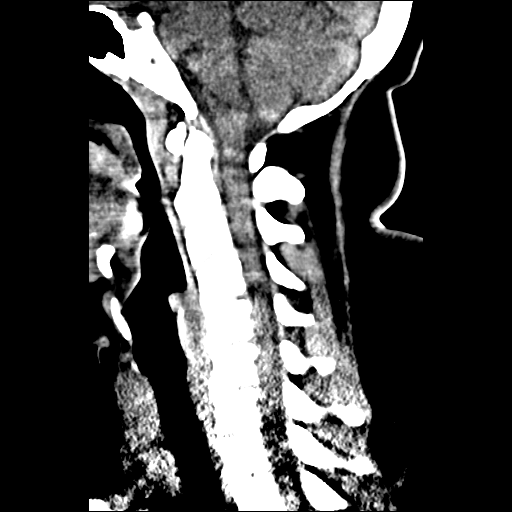
[im 30/45  brain]
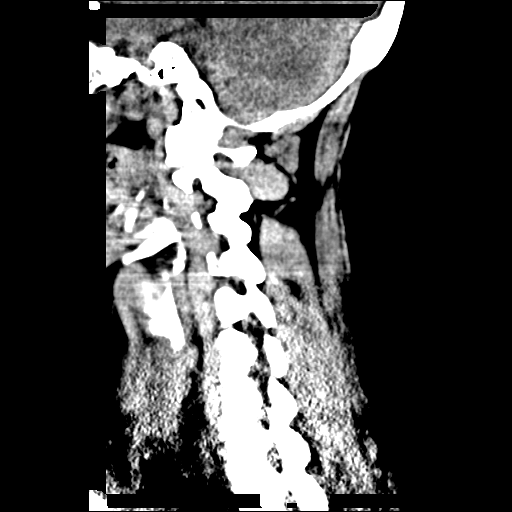

[18 of 47 positions shown; findings below may reference images not displayed]

FINDINGS: There is no evidence of acute intracranial abnormality, including mass or mass effect, hydrocephalus, extra-axial fluid collection, midline shift, hemorrhage, or infarct.  Please note that acute infarct may be occult on CT.  The visualized bony calvarium is unremarkable.  The inner and middle ears and mastoid air cells are clear.
IMPRESSION: No acute abnormality.
 CERVICAL SPINE CT WITHOUT CONTRAST:
FINDINGS: There is reversal of the normal cervical lordosis without evidence of acute fracture, subluxation, or dislocation.  There is no evidence of prevertebral soft tissue swelling.  Congenitally shortened pedicles contribute to mild central spinal narrowing.
IMPRESSION: 1.  Reversal of normal cervical lordosis without evidence of fracture, subluxation or prevertebral soft tissue swelling. 
 2.  Mild congenital central spinal narrowing.

## 2008-07-30 HISTORY — PX: OTHER SURGICAL HISTORY: SHX169

## 2009-12-07 ENCOUNTER — Emergency Department (HOSPITAL_COMMUNITY): Admission: EM | Admit: 2009-12-07 | Discharge: 2009-12-07 | Payer: Self-pay | Admitting: Emergency Medicine

## 2011-03-05 NOTE — Consult Note (Signed)
NAME:  MACKIE, GOON NO.:  [id number]   MEDICAL RECORD NO.:  [id number]                   PATIENT TYPE:  EMS   LOCATION:  [facility]                                 FACILITY:  [facility]   PHYSICIAN:  Marlan Palau, M.D.               DATE OF BIRTH:  1971/02/14   DATE OF CONSULTATION:  02/28/2004  DATE OF DISCHARGE:                                   CONSULTATION   CONSULTING PHYSICIAN:  Marlan Palau, M.D.   HISTORY OF PRESENT ILLNESS:  Bridget Stevens is a 40 year old, morbidly  obese, white female, born March 23, 1971, with a history of low back pain for  which she is getting epidural steroid injections.  On Feb 25, 2004, the  patient went in for an epidural steroid injection and shortly thereafter,  the patient began noting headaches when she left from the procedure.  The  patient noted bifrontal headaches associated with some generalization of the  headache, neck stiffness.  The patient saw spots in front of her eyes, felt  a bit flushed, felt she might be running a fever but no fever was ever  documented.  The patient took her temperature once, but she was afebrile.  The patient is afebrile in the emergency room.  Because of the neck  stiffness, the patient was sent to the emergency room for an evaluation.  The patient has had multiple lumbar puncture attempts done under fluoroscopy  and they have all been unsuccessful.  The patient has a minimally elevated  white count of 11.8 but did receive steroids via epidural steroid injection  several days ago.  The patient is bright, alert, jovial.  The patient claims  when she is lying down flat, she has no headache whatsoever.  Reports that  when she sits or stands, the headache comes on.  Neurology has been asked to  see this patient for further evaluation.   PAST MEDICAL HISTORY:  1. Morbid obesity.  2. Low back pain.  3. Status post epidural steroid injection, now with positional post LP     headache.  4. The patient gives a history of hypothyroidism, currently off medications.   ALLERGIES:  SULFA drugs.   Does not smoke.  Drinks alcohol on occasion.  Denies use of drugs.   The patient has been on some Motrin for her low back.   SOCIAL HISTORY:  This patient is married, lives in [place], [place],  has no children.  Does not work.   FAMILY MEDICAL HISTORY:  Notable that both parents are alive.  Father has  diabetes.  The patient has one brother, one sister, both alive and well.   REVIEW OF SYSTEMS:  Notable for headaches as above.  The patient notes neck  stiffness.  Denies shortness of breath.  Questionable history of asthma.  The patient has no chest pains, some heartburn problems, denies any  abdominal pain, problems  controlling the bowels, bladder, black out  episodes, dizziness.   PHYSICAL EXAMINATION:  VITAL SIGNS:  Blood pressure is 137/85,  heart rate  77, respiratory rate 20, temperature afebrile.  GENERAL:  The patient is a markedly obese white female who is alert and  cooperative at the time of examination.  HEENT:  Head is atraumatic.  Eyes:  Pupils equal, round and reactive to  light.  Disks are flat bilaterally.  No definite venous pulsations were  seen.  NECK:  Relatively supple.  No carotid bruits noted.  RESPIRATORY:  Clear.  CARDIOVASCULAR:  Reveals a regular rate and rhythm.  No obvious murmurs rubs  noted.  EXTREMITIES:  Reveal trace edema at the ankles.  NEUROLOGIC:  Cranial nerves:  As above.  Facial symmetry is present.  Patient has good sensation of the face to pinprick and soft touch  bilaterally.  Has good strength of the facial muscles and the muscles of  head turning, shoulder shrug bilaterally.  Speech is well enunciated, not  aphasic.  Motor testing reveals 5/5 strength in all four extremities.  Good  symmetric motor tone is noted throughout.  Sensory testing is intact to  pinprick, soft touch, vibratory sensation throughout.  The patient has  fair  finger-to-nose-finger, toe-to-finger bilaterally.  No drift is seen.  Deep  tendon reflexes are symmetric.  Toes downgoing.  The patient was not  ambulated.   LABORATORY VALUES:  Notable for:  A white count of 11.8, hemoglobin of 14.9,  hematocrit of 43.5, MCV of 40.4, platelets of 298.   IMPRESSION:  1. Status post recent epidural steroid injection.  2. Post lumbar puncture headaches with positional component.   This patient has no evidence of meningitis at this point.  The patient notes  that when she lies down, the headache disappears.  The headache tends to  come on when she sits or stands.  Mild elevation of white count likely  secondary to recent injection of steroids.  No history of confusion, altered  mental status.  At this point, the patient is neurologically cleared to  return home.  The patient is to keep her head down through the weekend.  If  headache continues, may require a blood patch.  Fortunately, the multiple LP  attempts were unsuccessful during this hospital visit.                                               Marlan Palau, M.D.    CKW/MEDQ  D:  02/28/2004  T:  02/29/2004  Job:  [id number]   cc:   Veverly Fells. Ophelia Charter, M.D.  [address] [place], [place] [address]  Fax: [phone]   Avelino Leeds  [address]  [place]  [place] [address]  Fax: [phone]   [facility]  [address]  [address]

## 2011-03-08 ENCOUNTER — Emergency Department (HOSPITAL_COMMUNITY): Payer: BC Managed Care – PPO

## 2011-03-08 ENCOUNTER — Emergency Department (HOSPITAL_COMMUNITY)
Admission: EM | Admit: 2011-03-08 | Discharge: 2011-03-09 | Disposition: A | Discharge status: Home / Self Care | Payer: BC Managed Care – PPO | Attending: Emergency Medicine | Admitting: Emergency Medicine

## 2011-03-08 DIAGNOSIS — R1013 Epigastric pain: Secondary | ICD-10-CM | POA: Insufficient documentation

## 2011-03-08 DIAGNOSIS — J45909 Unspecified asthma, uncomplicated: Secondary | ICD-10-CM | POA: Insufficient documentation

## 2011-03-08 LAB — COMPREHENSIVE METABOLIC PANEL
ALT: 16 U/L (ref 0–35)
AST: 13 U/L (ref 0–37)
Albumin: 3.1 g/dL — ABNORMAL LOW (ref 3.5–5.2)
Alkaline Phosphatase: 95 U/L (ref 39–117)
BUN: 8 mg/dL (ref 6–23)
CO2: 28 mEq/L (ref 19–32)
Calcium: 9.3 mg/dL (ref 8.4–10.5)
Chloride: 104 mEq/L (ref 96–112)
GFR calc Af Amer: >60 mL/min (ref >60)
Glucose, Bld: 101 mg/dL — ABNORMAL HIGH (ref 70–99)
Potassium: 3.9 mEq/L (ref 3.5–5.1)
Sodium: 141 mEq/L (ref 135–145)
Total Bilirubin: 0.5 mg/dL (ref 0.3–1.2)
Total Protein: 7.7 g/dL (ref 6.0–8.3)

## 2011-03-08 LAB — URINE MICROSCOPIC-ADD ON

## 2011-03-08 LAB — CBC
HCT: 35.9 % — ABNORMAL LOW (ref 36.0–46.0)
Hemoglobin: 11.6 g/dL — ABNORMAL LOW (ref 12.0–15.0)
MCH: 27.7 pg (ref 26.0–34.0)
MCHC: 32.3 g/dL (ref 30.0–36.0)
MCV: 85.7 fL (ref 78.0–100.0)
Platelets: 236 10*3/uL (ref 150–400)
RBC: 4.19 MIL/uL (ref 3.87–5.11)
RDW: 13.9 % (ref 11.5–15.5)
WBC: 9.1 10*3/uL (ref 4.0–10.5)

## 2011-03-08 LAB — URINALYSIS, ROUTINE W REFLEX MICROSCOPIC
Bilirubin Urine: NEGATIVE
Glucose, UA: NEGATIVE mg/dL
Hgb urine dipstick: NEGATIVE
Ketones, ur: NEGATIVE mg/dL
Nitrite: NEGATIVE
Protein, ur: 30 mg/dL — AB
Specific Gravity, Urine: 1.019 (ref 1.005–1.030)
Urobilinogen, UA: 1 mg/dL (ref 0.0–1.0)
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
## 2011-03-11 LAB — URINE CULTURE

## 2011-11-27 IMAGING — CR DG HIP (WITH OR WITHOUT PELVIS) 2-3V*L*
3 series · 3 of 3 positions shown · non-contrast
Comparison: None.

CLINICAL DATA: Severe left hip pain.

LEFT HIP - COMPLETE 2+ VIEW

[t pelvis a.p.]
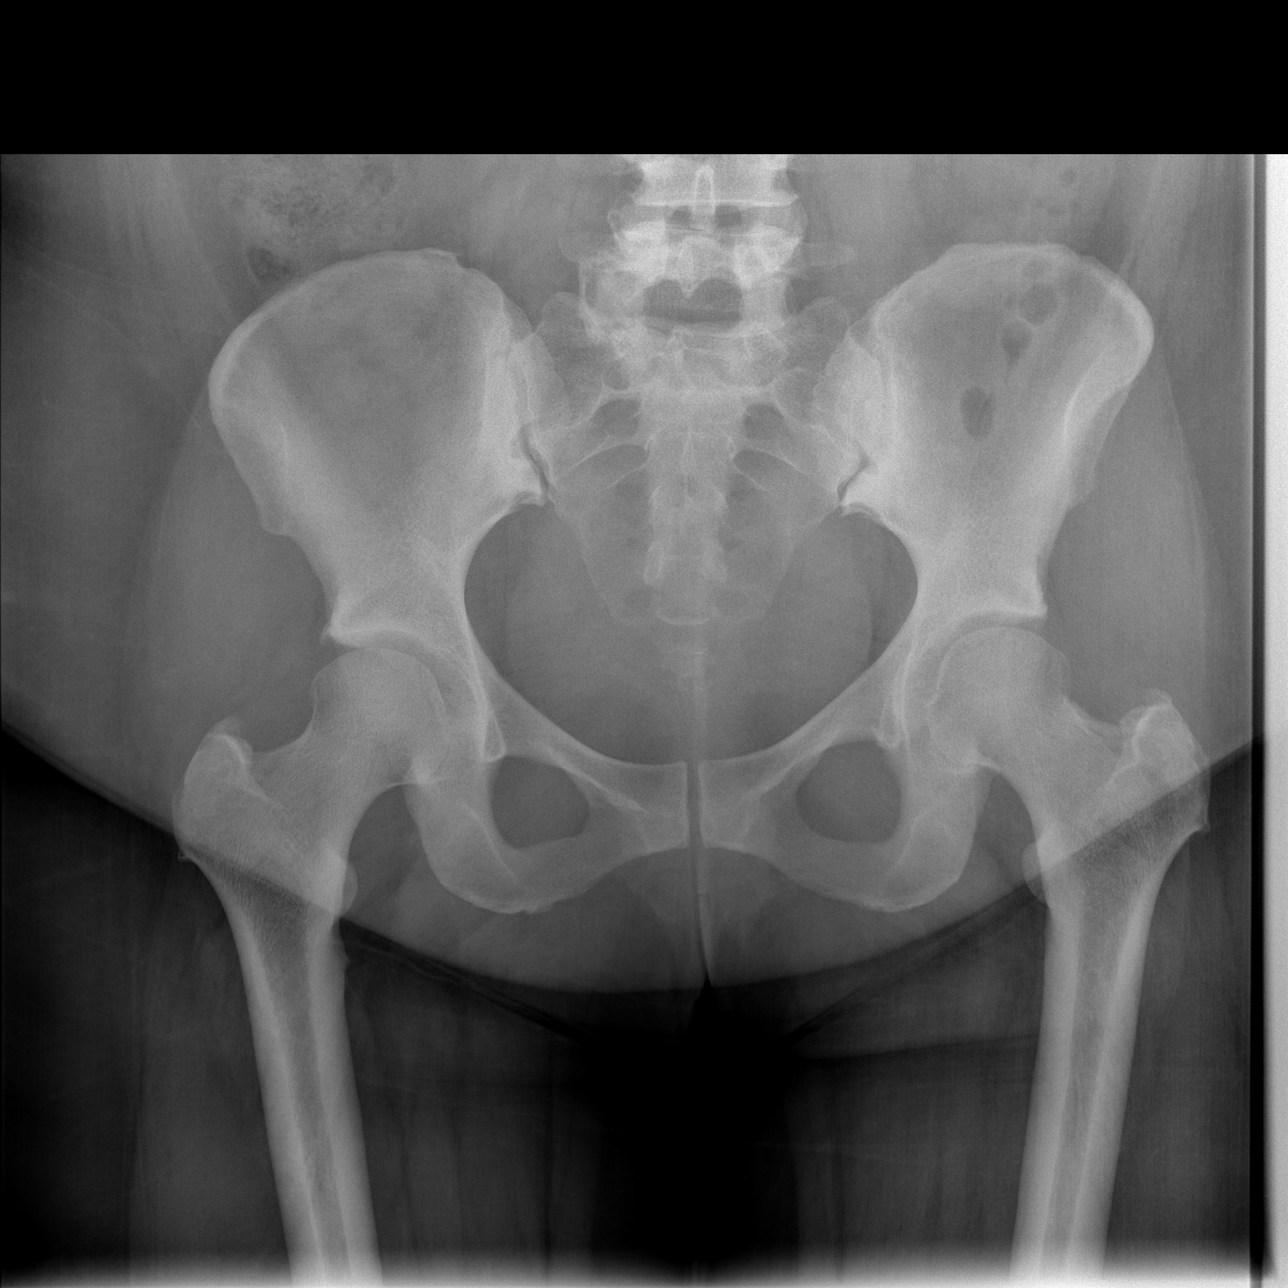

[t hip ap left]
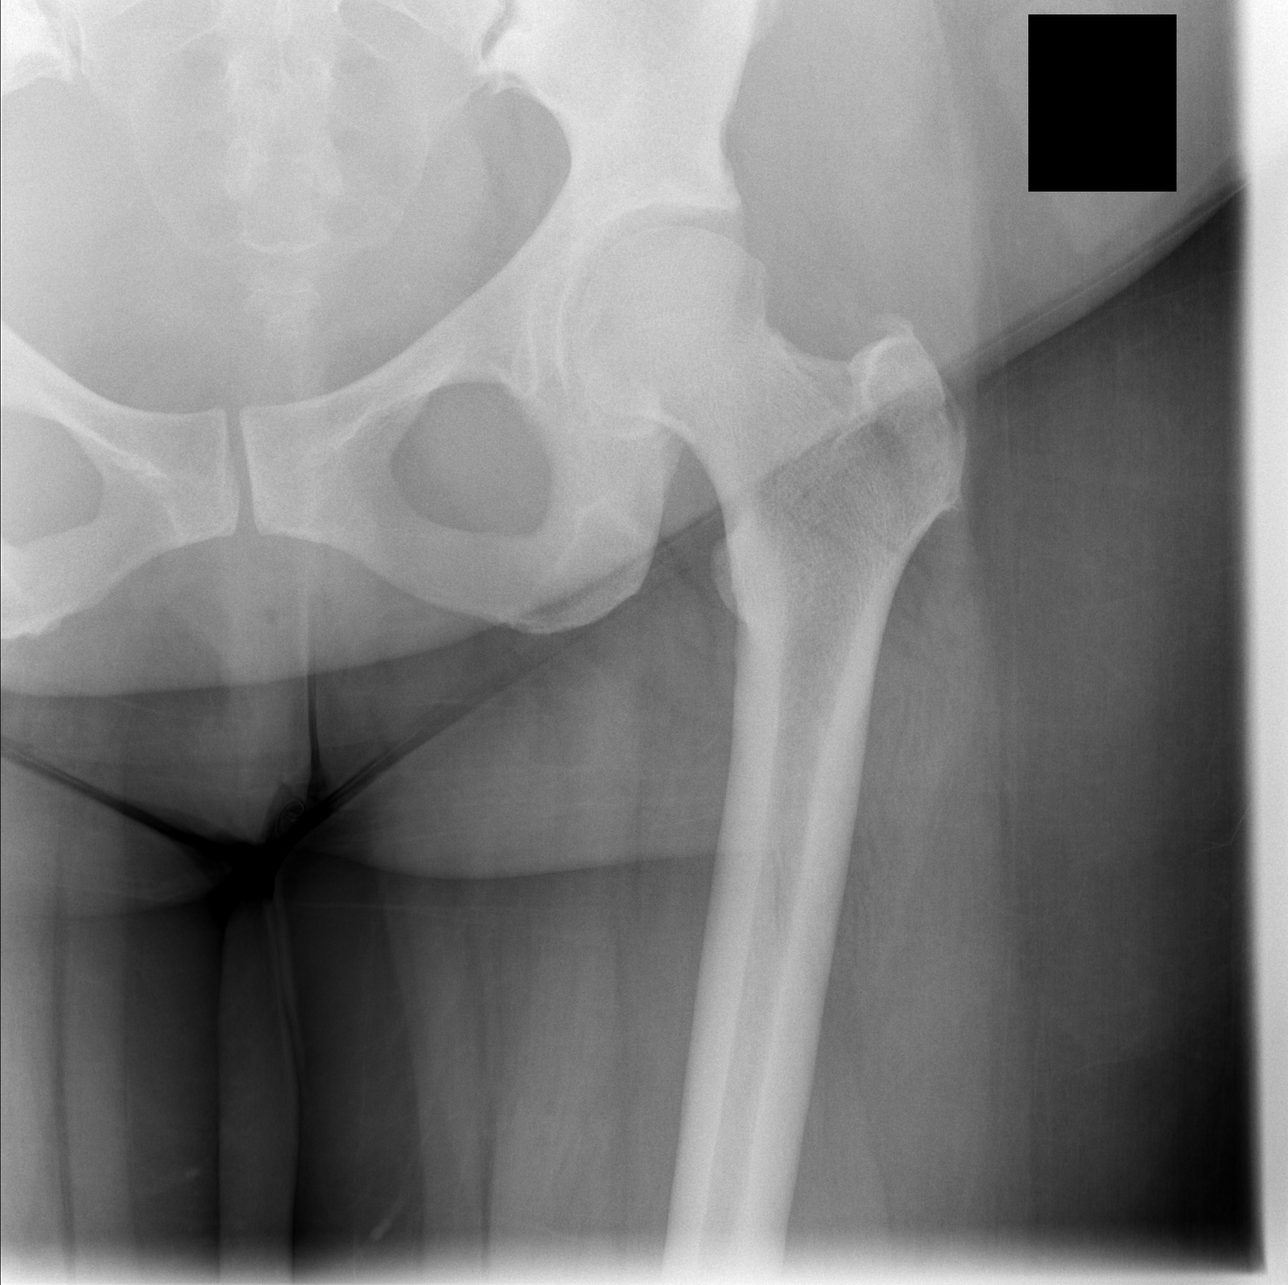

[t hip frog leg left]
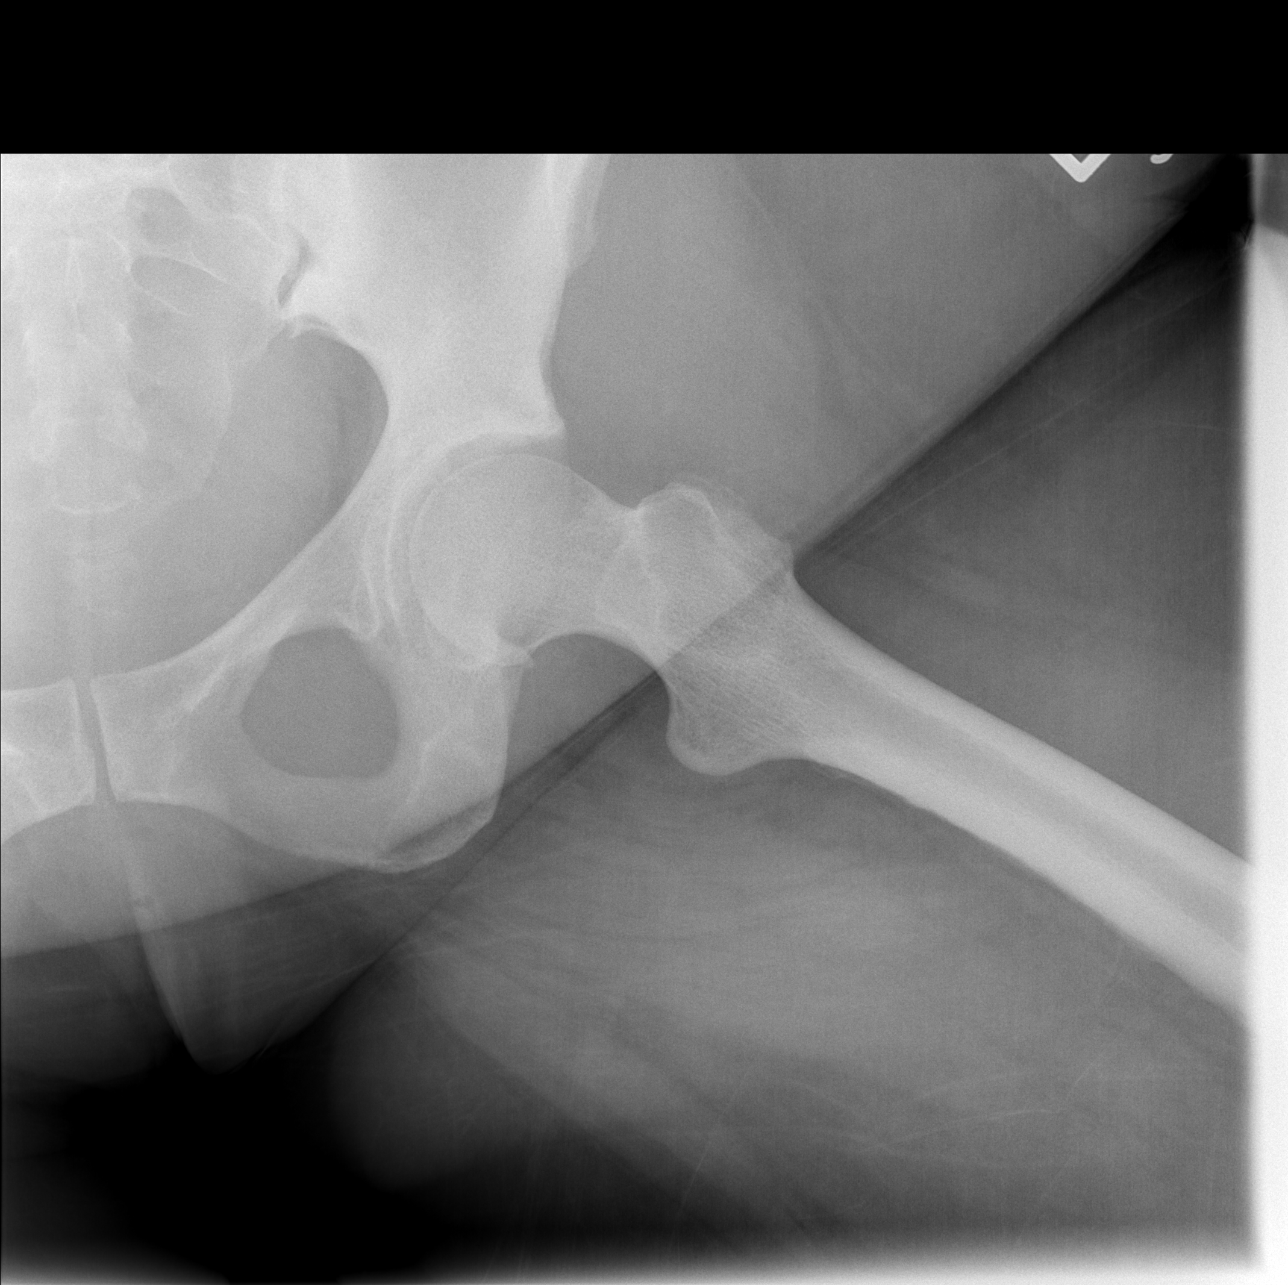

[3 of 3 positions shown; findings below may reference images not displayed]

FINDINGS: There is no fracture or dislocation.  Joint spaces are
maintained.  No focal bony lesion.  Soft tissue structures are
unremarkable.
IMPRESSION: Negative study.

## 2012-01-17 ENCOUNTER — Encounter: Payer: Self-pay | Admitting: Obstetrics and Gynecology

## 2012-01-24 ENCOUNTER — Encounter: Payer: Self-pay | Admitting: Obstetrics and Gynecology

## 2012-02-17 ENCOUNTER — Ambulatory Visit (INDEPENDENT_AMBULATORY_CARE_PROVIDER_SITE_OTHER): Payer: Medicaid Other | Admitting: Obstetrics and Gynecology

## 2012-02-17 ENCOUNTER — Encounter: Payer: Self-pay | Admitting: Obstetrics and Gynecology

## 2012-02-17 ENCOUNTER — Telehealth: Payer: Self-pay | Admitting: Obstetrics and Gynecology

## 2012-02-17 VITALS — BP 100/70 | HR 80 | Resp 16 | Ht 60.0 in | Wt 205.0 lb

## 2012-02-17 DIAGNOSIS — Z139 Encounter for screening, unspecified: Secondary | ICD-10-CM

## 2012-02-17 DIAGNOSIS — N920 Excessive and frequent menstruation with regular cycle: Secondary | ICD-10-CM

## 2012-02-17 LAB — CBC
HCT: 41.4 % (ref 36.0–46.0)
Hemoglobin: 13.6 g/dL (ref 12.0–15.0)
MCH: 29.9 pg (ref 26.0–34.0)
MCHC: 32.9 g/dL (ref 30.0–36.0)
MCV: 91 fL (ref 78.0–100.0)
Platelets: 307 10*3/uL (ref 150–400)
RDW: 14.5 % (ref 11.5–15.5)
WBC: 7.8 10*3/uL (ref 4.0–10.5)

## 2012-02-17 LAB — PROLACTIN: Prolactin: 5.5 ng/mL

## 2012-02-17 LAB — TSH: TSH: 4.079 u[IU]/mL (ref 0.350–4.500)

## 2012-02-17 NOTE — Progress Notes (Signed)
C/o 2 menses in March.  Never happened before.  Also c/o hot flashes.  Filed Vitals:   02/17/12 0955  BP: 100/70  Pulse: 80  Resp: 16   ROS: noncontributory  Pelvic exam:  VULVA: normal appearing vulva with no masses, tenderness or lesions, VAGINA: normal appearing vagina with normal color and discharge, no lesions, CERVIX: normal appearing cervix without discharge or lesions,  UTERUS: uterus is normal size, shape, consistency and nontender,  ADNEXA: normal adnexa in size, nontender and no masses.  A/P Pap done last yr with Dr. Clarene [facility] today (tsh, prl, cbc, vit d) sched u/s F/u next available for u/s and Em Bx

## 2012-02-18 ENCOUNTER — Encounter: Payer: Self-pay | Admitting: Obstetrics and Gynecology

## 2012-02-18 LAB — VITAMIN D 25 HYDROXY (VIT D DEFICIENCY, FRACTURES): Vit D, 25-Hydroxy: 24 ng/mL — ABNORMAL LOW (ref 30–89)

## 2012-02-21 ENCOUNTER — Telehealth: Payer: Self-pay

## 2012-02-21 ENCOUNTER — Other Ambulatory Visit: Payer: Self-pay

## 2012-02-21 DIAGNOSIS — D510 Vitamin B12 deficiency anemia due to intrinsic factor deficiency: Secondary | ICD-10-CM

## 2012-02-21 NOTE — Telephone Encounter (Signed)
Pt was called re; Vit-d protocol. Rx was called to [facility] in [place] [phone] for Vit-d softgels 50,000 units 1 capsule 1x wk for 12 wks 0rf.PG CMA.

## 2012-02-21 NOTE — Telephone Encounter (Signed)
RX for Vit-d was called to [facility] [place] should be [facility] [place]. Correction made. PG CMA

## 2012-02-24 ENCOUNTER — Telehealth: Payer: Self-pay

## 2012-02-24 NOTE — Telephone Encounter (Signed)
LM for pt to cb re: the message she left. Lamiah Marmol, Jacqueline A  

## 2012-02-24 NOTE — Telephone Encounter (Signed)
Bridget Stevens.

## 2012-03-10 ENCOUNTER — Other Ambulatory Visit: Payer: Self-pay | Admitting: Obstetrics and Gynecology

## 2012-03-10 ENCOUNTER — Encounter: Payer: Self-pay | Admitting: Obstetrics and Gynecology

## 2012-03-10 ENCOUNTER — Ambulatory Visit (INDEPENDENT_AMBULATORY_CARE_PROVIDER_SITE_OTHER): Payer: Medicaid Other

## 2012-03-10 ENCOUNTER — Ambulatory Visit (INDEPENDENT_AMBULATORY_CARE_PROVIDER_SITE_OTHER): Payer: Medicaid Other | Admitting: Obstetrics and Gynecology

## 2012-03-10 VITALS — BP 122/80 | Resp 16 | Ht 60.0 in | Wt 202.0 lb

## 2012-03-10 DIAGNOSIS — N920 Excessive and frequent menstruation with regular cycle: Secondary | ICD-10-CM

## 2012-03-10 LAB — POCT URINE PREGNANCY: Preg Test, Ur: NEGATIVE

## 2012-03-10 NOTE — Progress Notes (Signed)
Pt has complaints of stomach hurting near bellybutton J[facility] Ultrasound today shows the uterus to measure 7.9 x 3.9 x 4.4 cm no masses are visualized both ovaries appear to be within normal limits  Filed Vitals:   03/10/12 1017  BP: 122/80  Resp: 16   ROS: noncontributory  Pelvic exam:  VULVA: normal appearing vulva with no masses, tenderness or lesions,  VAGINA: normal appearing vagina with normal color and discharge, no lesions, CERVIX: normal appearing cervix without discharge or lesions,  UTERUS: uterus is normal size, shape, consistency and nontender,  ADNEXA: normal adnexa in size, nontender and no masses.  Endometrial biopsy performed today per protocol.  Pipelle passed x3 to proximally 7 cm.  Assessment and plan Menometrorrhagia options discussed including obs vs hormonal management and surgical management.  Pamphlets for the Mirena and hysteroscopy and ablation given to the patient.  Labs reviewed and normal except for vitamin D. Return to office in 2 weeks for followup of endometrial biopsy and patient will let me know what she decided to do as far as management for her medical menometrorrhagia.

## 2012-03-14 ENCOUNTER — Telehealth: Payer: Self-pay | Admitting: Obstetrics and Gynecology

## 2012-03-14 NOTE — Telephone Encounter (Signed)
Returned pt's call re: question about vit D follow up. Bridget Stevens

## 2012-03-15 LAB — PATHOLOGY

## 2012-03-22 NOTE — Telephone Encounter (Signed)
Chart note for encounter [facility] only. Bridget Stevens

## 2012-03-23 ENCOUNTER — Encounter: Payer: Medicaid Other | Admitting: Obstetrics and Gynecology

## 2012-04-12 ENCOUNTER — Encounter (HOSPITAL_COMMUNITY): Payer: Self-pay

## 2012-04-12 ENCOUNTER — Emergency Department (HOSPITAL_COMMUNITY)
Admission: EM | Admit: 2012-04-12 | Discharge: 2012-04-12 | Disposition: A | Discharge status: Home / Self Care | Payer: Medicaid Other | Source: Home / Self Care | Attending: Family Medicine | Admitting: Family Medicine

## 2012-04-12 DIAGNOSIS — L03314 Cellulitis of groin: Secondary | ICD-10-CM

## 2012-04-12 DIAGNOSIS — L02219 Cutaneous abscess of trunk, unspecified: Secondary | ICD-10-CM

## 2012-04-12 MED ORDER — DOXYCYCLINE HYCLATE 100 MG PO CAPS: 100.0000 mg | ORAL_CAPSULE | Freq: Two times a day (BID) | ORAL | Status: AC

## 2012-04-12 NOTE — ED Provider Notes (Signed)
History     CSN: [id number]  Arrival date & time 04/12/12  1546   First MD Initiated Contact with Patient 04/12/12 1649      Chief Complaint  Patient presents with  . Recurrent Skin Infections    (Consider location/radiation/quality/duration/timing/severity/associated sxs/prior treatment) Patient is a 41 y.o. female presenting with abscess. The history is provided by the patient.  Abscess  This is a new problem. The current episode started yesterday. The onset was sudden. The problem has been gradually worsening. The abscess is present on the groin. The problem is mild. The abscess is characterized by painfulness.    Past Medical History  Diagnosis Date  . Anxiety   . Asthma   . Thyroid disease   . Hx of migraines     Past Surgical History  Procedure Date  . Cesarean section   . Btl 07/30/2008    Family History  Problem Relation Age of Onset  . Diabetes Father   . Cancer Mother     History  Substance Use Topics  . Smoking status: Never Smoker   . Smokeless tobacco: Not on file  . Alcohol Use: Yes     occasionally liquour or beer    OB History    Grav Para Term Preterm Abortions TAB SAB Ect Mult Living   1 1              Review of Systems  Constitutional: Negative.   Skin: Positive for rash.    Allergies  Shellfish allergy and Sulfa antibiotics  Home Medications   Current Outpatient Rx  Name Route Sig Dispense Refill  . ALPRAZOLAM 1 MG PO TABS Oral Take 1 mg by mouth daily as needed.    Marland Kitchen DOXYCYCLINE HYCLATE 100 MG PO CAPS Oral Take 1 capsule (100 mg total) by mouth 2 (two) times daily. 20 capsule 0  . FLUOXETINE HCL 40 MG PO CAPS Oral Take 40 mg by mouth daily.    Marland Kitchen OMEPRAZOLE 20 MG PO CPDR Oral Take 20 mg by mouth daily.    Marland Kitchen VITAMIN D (ERGOCALCIFEROL) 50000 UNITS PO CAPS Oral Take 50,000 Units by mouth.      BP 127/78  Pulse 70  Temp 98.1 F (36.7 C) (Oral)  Resp 17  SpO2 98%  Physical Exam  Nursing note and vitals  reviewed. Constitutional: She is oriented to person, place, and time. She appears well-developed and well-nourished.  Neurological: She is alert and oriented to person, place, and time.  Skin: Skin is warm and dry. Rash noted.       1cm indurated nodule to right pubic area, no fluctuance or drainage    ED Course  Procedures (including critical care time)  Labs Reviewed - No data to display No results found.   1. Cellulitis of groin, right       MDM          Linna Hoff, MD 04/12/12 1742

## 2012-04-12 NOTE — Discharge Instructions (Signed)
Warm compress twice a day when you take the antibiotic, take all of medicine, return as needed. °

## 2012-04-12 NOTE — ED Notes (Signed)
C/o pain in groin , skin infection; NAD

## 2012-04-28 ENCOUNTER — Encounter: Payer: Self-pay | Admitting: Obstetrics and Gynecology

## 2012-04-28 ENCOUNTER — Ambulatory Visit (INDEPENDENT_AMBULATORY_CARE_PROVIDER_SITE_OTHER): Payer: Medicaid Other | Admitting: Obstetrics and Gynecology

## 2012-04-28 VITALS — BP 112/78 | Resp 16 | Ht 60.0 in | Wt 209.0 lb

## 2012-04-28 DIAGNOSIS — N920 Excessive and frequent menstruation with regular cycle: Secondary | ICD-10-CM

## 2012-04-28 NOTE — Progress Notes (Signed)
EmBx results reviewed - neg  Filed Vitals:   04/28/12 1201  BP: 112/78  Resp: 16   Questions answered Pt wants to have ablation H/o csection only Will schedule at hospital

## 2012-05-01 ENCOUNTER — Telehealth: Payer: Self-pay | Admitting: Obstetrics and Gynecology

## 2012-05-02 ENCOUNTER — Telehealth: Payer: Self-pay | Admitting: Obstetrics and Gynecology

## 2012-05-02 ENCOUNTER — Other Ambulatory Visit: Payer: Self-pay | Admitting: Obstetrics and Gynecology

## 2012-05-02 NOTE — Telephone Encounter (Signed)
Hysteroscopy D&C Ablation scheduled for 05/11/12 @ 1:00 with AR. Patient has MCD. CA Authorization obtained  From Hannah. NPI [id number]; good for Pre-op, Surgery, and Post-op.  -Adrianne Pridgen

## 2012-05-04 ENCOUNTER — Encounter (HOSPITAL_COMMUNITY): Payer: Self-pay | Admitting: Pharmacist

## 2012-05-04 ENCOUNTER — Encounter: Payer: Self-pay | Admitting: Obstetrics and Gynecology

## 2012-05-04 ENCOUNTER — Ambulatory Visit (INDEPENDENT_AMBULATORY_CARE_PROVIDER_SITE_OTHER): Payer: Medicaid Other | Admitting: Obstetrics and Gynecology

## 2012-05-04 VITALS — BP 116/78 | HR 72 | Temp 98.1°F | Resp 16 | Ht 60.0 in | Wt 209.0 lb

## 2012-05-04 DIAGNOSIS — Z01818 Encounter for other preprocedural examination: Secondary | ICD-10-CM

## 2012-05-04 DIAGNOSIS — N920 Excessive and frequent menstruation with regular cycle: Secondary | ICD-10-CM

## 2012-05-04 NOTE — Progress Notes (Signed)
Bridget Stevens is a 41 y.o. female G1P1 who presents for hysteroscopy with dilatation, curettage and endometrial ablation because of menorrhagia. Since 2009 patient describes her periods as heavy.  They last for seven days, requires a pad change every 1.5 hours and produces moderate size clots.  She also has cramping rated at a 9/10 on a 10 point pain scale and decreases only to 7/10 with over-the-counter analgesia.  A pelvic ultrasound in may of 2013 was normal with the uterus measuring 79 x 3.9 x 4.1 cm.  She denies any intermenstrual bleeding, dyspareunia, urinary tract symptoms or vaginitis symptoms.  She admits to occasional constipation. Patient was found to have a normal TSH, prolactin and CBC in May 2013.  A review of both medical and surgical management options were given to the patient but she has decided to proceed with a hysteroscopy, dilatation, curettage and endometrial ablation.  Past Medical History  OB History: G1P1 C-section 2009  GYN History: menarche 41 YO   LMP  04/19/12   Contraception: tubal sterilization   The patient denies history of sexually transmitted disease.    Last PAP smear- 2012 was normal  Medical History: vitamin D deficiency, anxiety, migraines, hyperlipidemia, gastroesophageal reflux disease,   Surgical History: 2009 Tubal Sterilization States that after C-section she had to be "revived twice" afterwards and patient states that she "felt" her entire C-section (had epidural per patient).  Admits to a transfusion during C-section.   Family History: cancer, diabetes mellitus, hypertension and celiac disease  Social History: Divorced, unemployed;  denies tobacco and illicit drug use but admits to occasional alcohol intake   Outpatient Encounter Prescriptions as of 05/04/2012 Medication Sig Dispense Refill . ALPRAZolam (XANAX) 1 MG tablet Take 1 mg by mouth daily as needed.     . FLUoxetine (PROZAC) 40 MG capsule Take 40 mg by mouth daily.     . omeprazole  (PRILOSEC) 20 MG capsule Take 20 mg by mouth daily.     . Vitamin D, Ergocalciferol, (DRISDOL) 50000 UNITS CAPS Take 50,000 Units by mouth.       Allergies Allergen Reactions . Peanut-Containing Drug Products Swelling . Percocet (Oxycodone-Acetaminophen) Other (See Comments)   PT STATES THAT SHE HAS HEADACHES WITH THIS MED . Shellfish Allergy  . Sulfa Antibiotics Hives Denies problems with latex, soy products and adhesives.  Patient is able to tolerate Vicodin  ROS: admits to  reading glasses;  denies headache, vision changes, dysphagia, tinnitus, dizziness,  chest pain, shortness of breath, nausea, vomiting, diarrhea, dysuria, hematuria, pelvic pain, swelling of joints,easy bruising,  myalgias, arthralgias, skin rashes and except as is mentioned in the history of present illness, patient's review of systems is otherwise negative   Physical Exam    BP 116/78  Pulse 72  Temp 98.1 F (36.7 C) (Oral)  Resp 16  Ht 5' (1.524 m)  Wt 209 lb (94.802 kg)  BMI 40.82 kg/m2  LMP 04/19/2012  Neck: supple without masses or thyromegaly Lungs: clear to auscultation Heart: regular rate and rhythm Abdomen: soft, non-tender and no organomegaly Pelvic:EGBUS- wnl; vagina-normal rugae; uterus-normal size, cervix without lesions or motion tenderness; adnexae-no tenderness or masses Extremities:  no clubbing, cyanosis or edema   Assesment: Menorrhagia   Disposition:  A discussion was held with patient regarding the indication for her procedure(s) along with the risks, which include but are not limited to: reaction to anesthesia, damage to adjacent organs, infection, scarring and excessive bleeding. Patient verbalized understanding of these risks and has consented to   proceed with hysteroscopy with D & C and endometrial ablation at [facility], May 11, 2012 @ 1 p.m.  CSN# [id number]   Aunika Kirsten J. Gurfateh Mcclain, PA-C  for Dr. Angela Y. Roberts   

## 2012-05-05 ENCOUNTER — Encounter (HOSPITAL_COMMUNITY): Payer: Self-pay

## 2012-05-05 ENCOUNTER — Emergency Department (HOSPITAL_COMMUNITY): Payer: Medicaid Other

## 2012-05-05 ENCOUNTER — Emergency Department (HOSPITAL_COMMUNITY)
Admission: EM | Admit: 2012-05-05 | Discharge: 2012-05-05 | Disposition: A | Discharge status: Home / Self Care | Payer: Medicaid Other | Attending: Emergency Medicine | Admitting: Emergency Medicine

## 2012-05-05 DIAGNOSIS — E785 Hyperlipidemia, unspecified: Secondary | ICD-10-CM | POA: Insufficient documentation

## 2012-05-05 DIAGNOSIS — M722 Plantar fascial fibromatosis: Secondary | ICD-10-CM

## 2012-05-05 DIAGNOSIS — E079 Disorder of thyroid, unspecified: Secondary | ICD-10-CM | POA: Insufficient documentation

## 2012-05-05 IMAGING — CR DG FOOT 2V*L*
2 series · 2 of 2 positions shown · non-contrast
Comparison: None

CLINICAL DATA: Left foot pain at talar and heel regions, question
plantar fasciitis or heel spurring, pain with walking

LEFT FOOT - 2 VIEW

[t foot ap left]
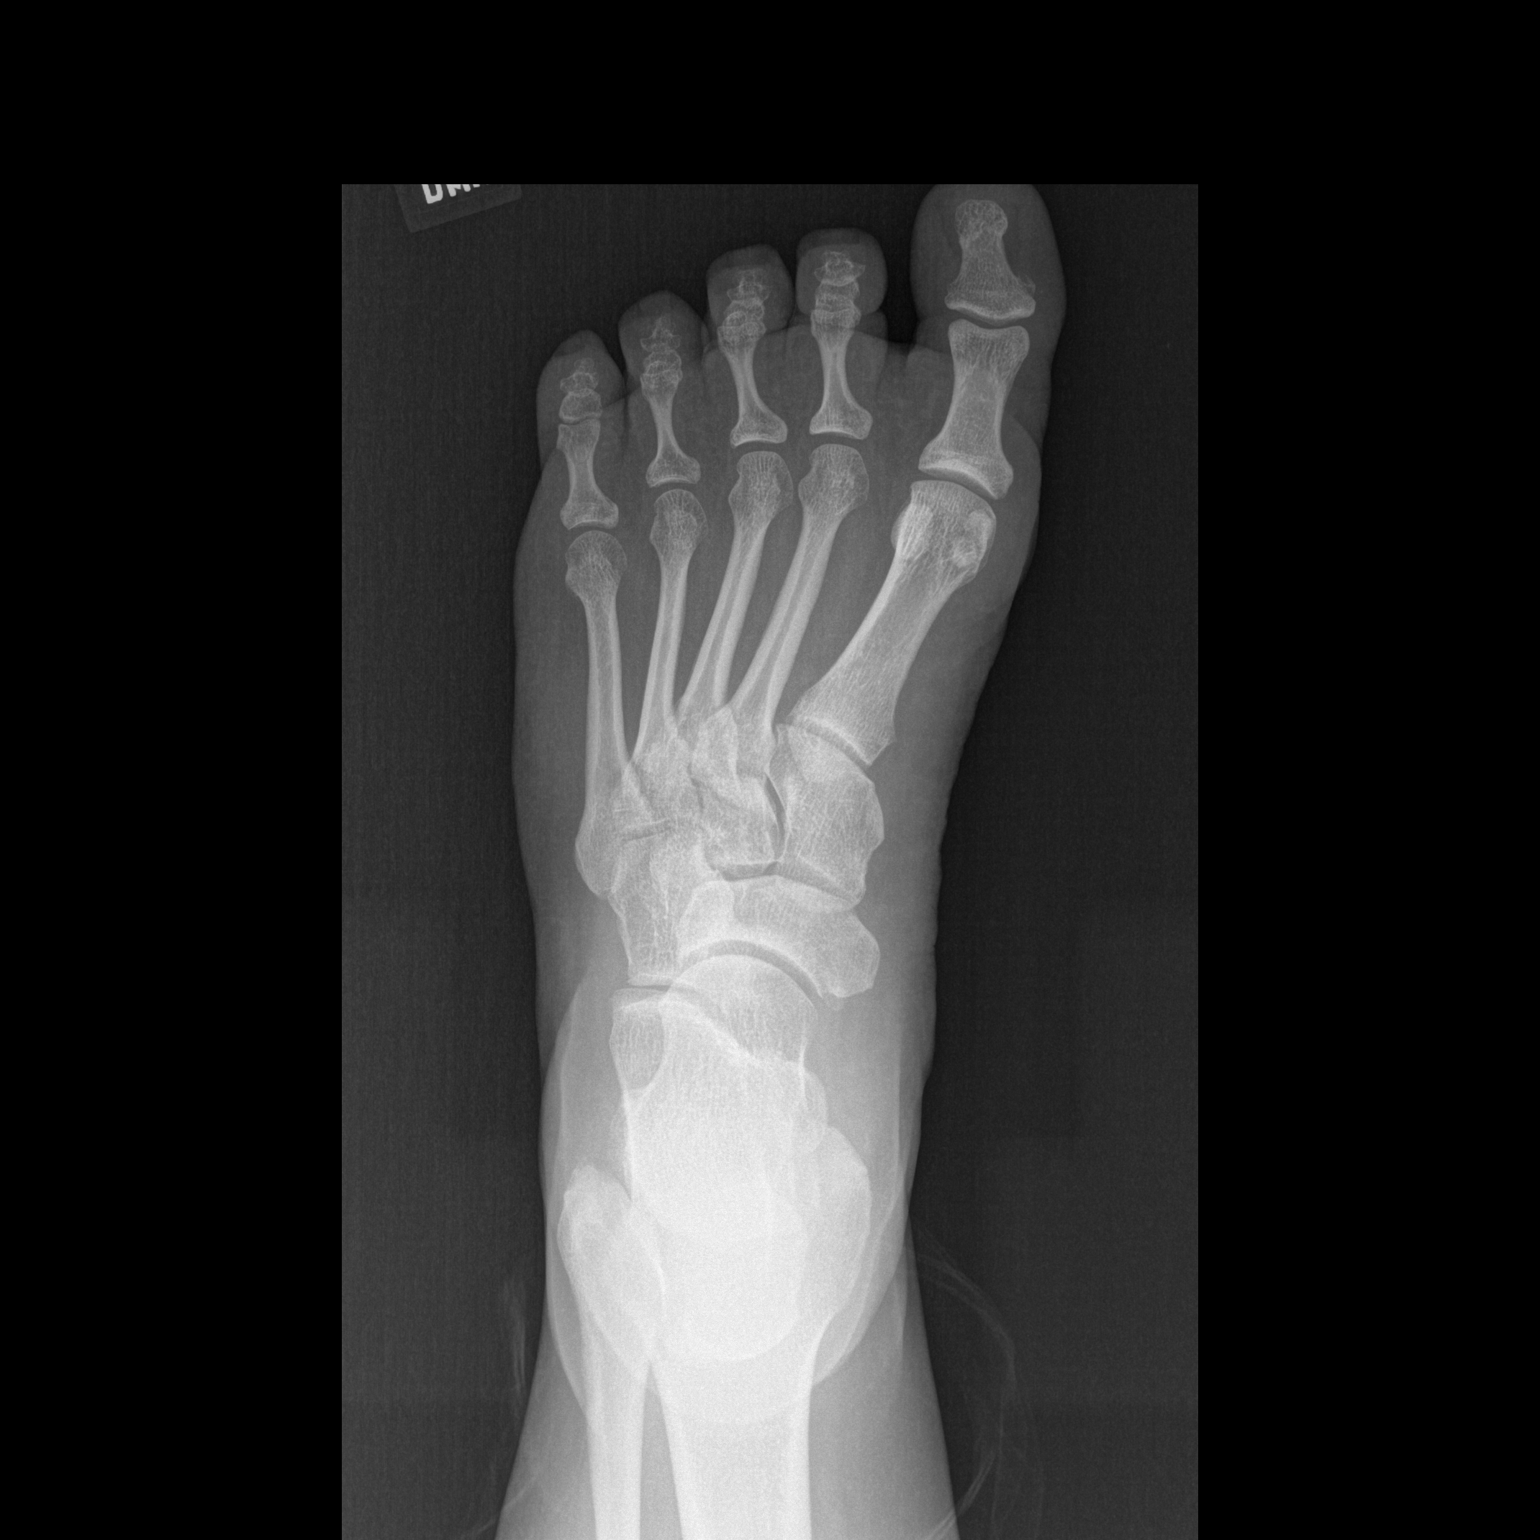

[t foot lat left]
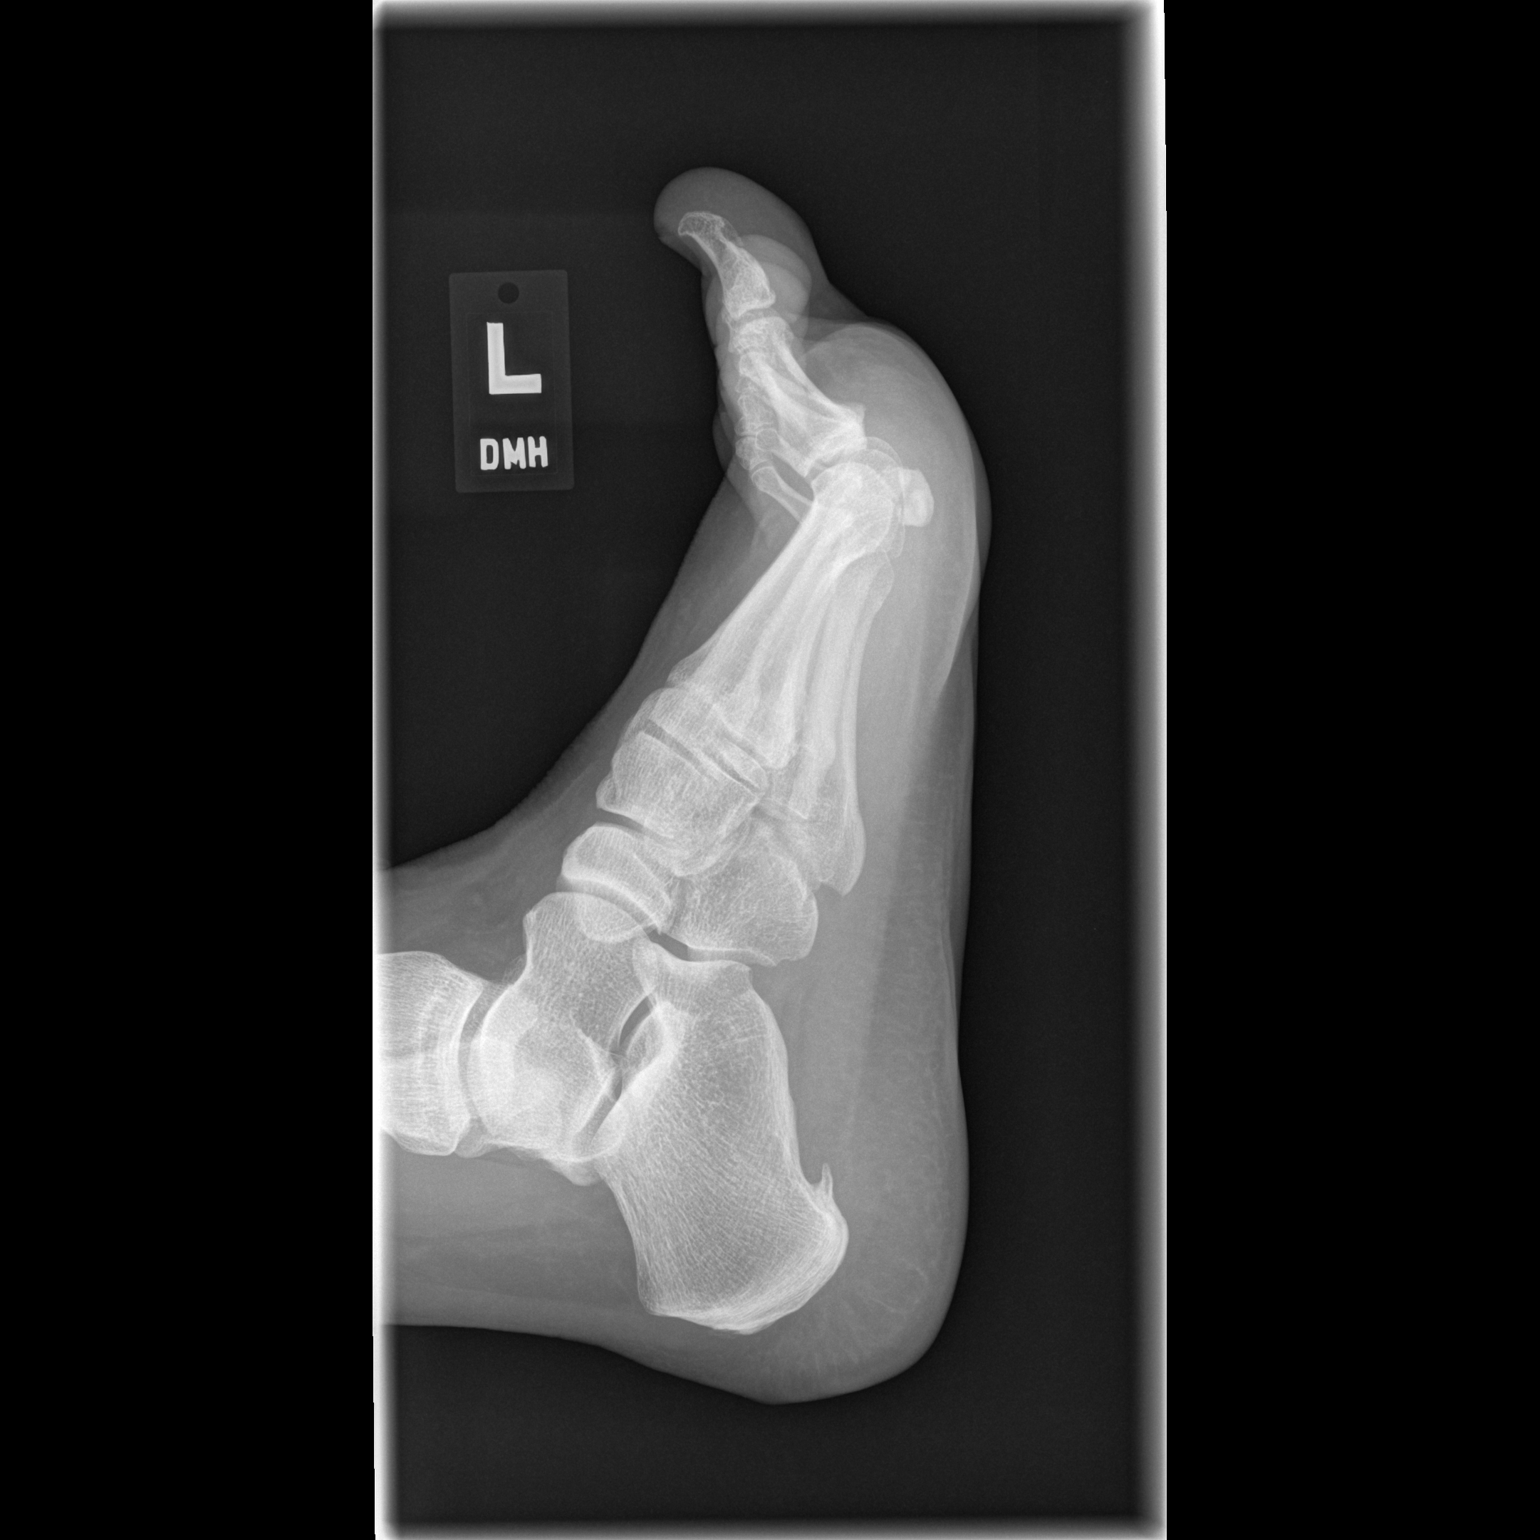

[2 of 2 positions shown; findings below may reference images not displayed]

FINDINGS: Small plantar calcaneal spur.
Osseous mineralization normal.
Joint spaces preserved.
No acute fracture, dislocation or bone destruction.
IMPRESSION: Small plantar calcaneal spur.
No acute osseous findings.

## 2012-05-05 NOTE — ED Notes (Signed)
Pt with foot pain on left foot, on voltarn for tendonitis but no releif.

## 2012-05-05 NOTE — ED Provider Notes (Signed)
History  This chart was scribed for Ward Givens, MD by Erskine Emery. This patient was seen in room TR06C/TR06C and the patient's care was started at 10:59.   CSN: [id number]  Arrival date & time 05/05/12  1029   First MD Initiated Contact with Patient 05/05/12 1059      Chief Complaint  Patient presents with  . Foot Pain    (Consider location/radiation/quality/duration/timing/severity/associated sxs/prior treatment) HPI Bridget Stevens is a 41 y.o. female who presents to the Emergency Department complaining of intermittent moderate foot pain for the last month. Pt reports seeing Dr. Aida Puffer a month ago and has been using Voltaren gel as he prescribed it, 4x/day since then. Pt reports the gel eases the pain temporarily, then it flares up again. Pt reports the pain is worsened by moving, standing, walking but is relieved by almost nothing. Pt reports she uses the treadmill or walks for about 30 minutes daily.   Pt reports a h/o back and knee pain for which she received cortisone shots. She has seen Dr Linda Hedges, orthopedist in the past.   PCP Dr Aida Puffer   Past Medical History  Diagnosis Date  . Anxiety   . Asthma   . Thyroid disease   . Hx of migraines   . Hyperlipidemia     Past Surgical History  Procedure Date  . Cesarean section   . Btl 07/30/2008  . Wisdom tooth extraction     Family History  Problem Relation Age of Onset  . Diabetes Father   . Hypertension Father   . Hyperlipidemia Father   . Thyroid disease Father   . Asthma Father   . Cancer Mother   . Hypertension Mother   . Thyroid disease Mother   . Cancer Sister     CERVICAL  . Thyroid disease Sister   . Asthma Sister   . Asthma Brother     History  Substance Use Topics  . Smoking status: Never Smoker   . Smokeless tobacco: Never Used  . Alcohol Use: Yes     occasionally liquour or beer  unemployed  OB History    Grav Para Term Preterm Abortions TAB SAB Ect Mult Living   1 1         1      Pt is not currently employed.   Review of Systems  Constitutional: Negative for fever and chills.  Respiratory: Negative for shortness of breath.   Gastrointestinal: Negative for nausea and vomiting.  Musculoskeletal:       Left foot pain  Neurological: Negative for weakness.    Allergies  Peanut-containing drug products; Percocet; Shellfish allergy; and Sulfa antibiotics  Home Medications   Current Outpatient Rx  Name Route Sig Dispense Refill  . ALPRAZOLAM 1 MG PO TABS Oral Take 1 mg by mouth daily as needed.    . ASPIRIN-CAFFEINE 500-32.5 MG PO TABS Oral Take by mouth as needed. For pain    . FLUOXETINE HCL 40 MG PO CAPS Oral Take 40 mg by mouth daily.    Marland Kitchen HAIR/SKIN/NAILS PO Oral Take by mouth. Biotin 1083mcg/[employer].    Marland Kitchen NAPROXEN SODIUM 220 MG PO CAPS Oral Take 440 mg by mouth as needed. For headache    . OMEPRAZOLE 20 MG PO CPDR Oral Take 20 mg by mouth daily.    Marland Kitchen PROMETHAZINE HCL 25 MG PO TABS Oral Take 25 mg by mouth every 6 (six) hours as needed. For nausea    .  VITAMIN D (ERGOCALCIFEROL) 50000 UNITS PO CAPS Oral Take 50,000 Units by mouth every 7 (seven) days.       BP 116/75  Pulse 67  Temp 98 F (36.7 C)  Resp 18  SpO2 99%  LMP 04/19/2012  Vital signs normal    Physical Exam  Nursing note and vitals reviewed. Constitutional: She is oriented to person, place, and time. She appears well-developed and well-nourished. No distress.  HENT:  Head: Normocephalic and atraumatic.  Right Ear: External ear normal.  Left Ear: External ear normal.  Eyes: Conjunctivae and EOM are normal. Pupils are equal, round, and reactive to light.  Neck: Normal range of motion. Neck supple. No tracheal deviation present.  Cardiovascular: Normal rate and regular rhythm.   Pulmonary/Chest: Effort normal and breath sounds normal. No respiratory distress.  Musculoskeletal: Normal range of motion. She exhibits tenderness. She exhibits no edema.       Left foot  appears normal, no swelling or erythema. Extremely tender to palpation on sole of the foot at the insertion of the achilles tendon, consistent with plantar fascitis.   Neurological: She is alert and oriented to person, place, and time.  Skin: Skin is warm and dry.  Psychiatric: She has a normal mood and affect. Her behavior is normal.    ED Course  Procedures (including critical care time)  DIAGNOSTIC STUDIES: Oxygen Saturation is 99% on room air, normal by my interpretation.    COORDINATION OF CARE:  11:45--I discussed treatment plan with pt and pt agreed. I informed the pt that her symptoms are consistent with plantar fascitis and that the best available treatments are gel insertion pads for her shoes and steroid injections. I instructed the pt to follow up with an orthopedist. Pt denied wanting any pain medication injections here.   12:25--I rechecked the pt and notified her that we would be providing her an antiinflammatory medication for the heel spur the imaging shows in her left foot.   Dg Foot 2 Views Left  05/05/2012  *RADIOLOGY REPORT*  Clinical Data: Left foot pain at talar and heel regions, question plantar fasciitis or heel spurring, pain with walking  LEFT FOOT - 2 VIEW  Comparison: None  Findings: Small plantar calcaneal spur. Osseous mineralization normal. Joint spaces preserved. No acute fracture, dislocation or bone destruction.  IMPRESSION: Small plantar calcaneal spur. No acute osseous findings.  Original Report Authenticated By: Lollie Marrow, M.D.     1. Plantar fasciitis of left foot     Plan gel heel inserts, NSAID  MDM  I personally performed the services described in this documentation, which was scribed in my presence. The recorded information has been reviewed and considered.  Devoria Albe, MD, Armando Gang          Ward Givens, MD 05/05/12 1235

## 2012-05-08 ENCOUNTER — Encounter: Payer: Self-pay | Admitting: Obstetrics and Gynecology

## 2012-05-08 NOTE — H&P (Signed)
Bridget Stevens is a 41 y.o. female G1P1 who presents for hysteroscopy with dilatation, curettage and endometrial ablation because of menorrhagia. Since 2009 patient describes her periods as heavy.  They last for seven days, requires a pad change every 1.5 hours and produces moderate size clots.  She also has cramping rated at a 9/10 on a 10 point pain scale and decreases only to 7/10 with over-the-counter analgesia.  A pelvic ultrasound in may of 2013 was normal with the uterus measuring 79 x 3.9 x 4.1 cm.  She denies any intermenstrual bleeding, dyspareunia, urinary tract symptoms or vaginitis symptoms.  She admits to occasional constipation. Patient was found to have a normal TSH, prolactin and CBC in May 2013.  A review of both medical and surgical management options were given to the patient but she has decided to proceed with a hysteroscopy, dilatation, curettage and endometrial ablation.  Past Medical History  OB History: G1P1 C-section 2009  GYN History: menarche 41 YO   LMP  04/19/12   Contraception: tubal sterilization   The patient denies history of sexually transmitted disease.    Last PAP smear- 2012 was normal  Medical History: vitamin D deficiency, anxiety, migraines, hyperlipidemia, gastroesophageal reflux disease,   Surgical History: 2009 Tubal Sterilization States that after C-section she had to be "revived twice" afterwards and patient states that she "felt" her entire C-section (had epidural per patient).  Admits to a transfusion during C-section.   Family History: cancer, diabetes mellitus, hypertension and celiac disease  Social History: Divorced, unemployed;  denies tobacco and illicit drug use but admits to occasional alcohol intake   Outpatient Encounter Prescriptions as of 05/04/2012 Medication Sig Dispense Refill . ALPRAZolam (XANAX) 1 MG tablet Take 1 mg by mouth daily as needed.     Marland Kitchen FLUoxetine (PROZAC) 40 MG capsule Take 40 mg by mouth daily.     Marland Kitchen omeprazole  (PRILOSEC) 20 MG capsule Take 20 mg by mouth daily.     . Vitamin D, Ergocalciferol, (DRISDOL) 50000 UNITS CAPS Take 50,000 Units by mouth.       Allergies Allergen Reactions . Peanut-Containing Drug Products Swelling . Percocet (Oxycodone-Acetaminophen) Other (See Comments)   PT STATES THAT SHE HAS HEADACHES WITH THIS MED . Shellfish Allergy  . Sulfa Antibiotics Hives Denies problems with latex, soy products and adhesives.  Patient is able to tolerate Vicodin  ROS: admits to  reading glasses;  denies headache, vision changes, dysphagia, tinnitus, dizziness,  chest pain, shortness of breath, nausea, vomiting, diarrhea, dysuria, hematuria, pelvic pain, swelling of joints,easy bruising,  myalgias, arthralgias, skin rashes and except as is mentioned in the history of present illness, patient's review of systems is otherwise negative   Physical Exam    BP 116/78  Pulse 72  Temp 98.1 F (36.7 C) (Oral)  Resp 16  Ht 5' (1.524 m)  Wt 209 lb (94.802 kg)  BMI 40.82 kg/m2  LMP 04/19/2012  Neck: supple without masses or thyromegaly Lungs: clear to auscultation Heart: regular rate and rhythm Abdomen: soft, non-tender and no organomegaly Pelvic:EGBUS- wnl; vagina-normal rugae; uterus-normal size, cervix without lesions or motion tenderness; adnexae-no tenderness or masses Extremities:  no clubbing, cyanosis or edema   Assesment: Menorrhagia   Disposition:  A discussion was held with patient regarding the indication for her procedure(s) along with the risks, which include but are not limited to: reaction to anesthesia, damage to adjacent organs, infection, scarring and excessive bleeding. Patient verbalized understanding of these risks and has consented to  proceed with hysteroscopy with D & C and endometrial ablation at [facility], May 11, 2012 @ 1 p.m.  CSN# [id number]   Mayerli Kirst J. Lowell Guitar, PA-C  for Dr. Woodroe Mode. Su Hilt

## 2012-05-11 ENCOUNTER — Encounter (HOSPITAL_COMMUNITY): Payer: Self-pay | Admitting: Anesthesiology

## 2012-05-11 ENCOUNTER — Other Ambulatory Visit: Payer: Self-pay

## 2012-05-11 ENCOUNTER — Encounter (HOSPITAL_COMMUNITY): Payer: Self-pay | Admitting: *Deleted

## 2012-05-11 ENCOUNTER — Encounter (HOSPITAL_COMMUNITY)
Admission: RE | Disposition: A | Discharge status: Home / Self Care | Payer: Self-pay | Source: Ambulatory Visit | Attending: Obstetrics and Gynecology

## 2012-05-11 ENCOUNTER — Ambulatory Visit (HOSPITAL_COMMUNITY)
Admission: RE | Admit: 2012-05-11 | Discharge: 2012-05-11 | Disposition: A | Discharge status: Home / Self Care | Payer: Medicaid Other | Source: Ambulatory Visit | Attending: Obstetrics and Gynecology | Admitting: Obstetrics and Gynecology

## 2012-05-11 ENCOUNTER — Ambulatory Visit (HOSPITAL_COMMUNITY): Payer: Medicaid Other | Admitting: Anesthesiology

## 2012-05-11 DIAGNOSIS — N920 Excessive and frequent menstruation with regular cycle: Secondary | ICD-10-CM | POA: Insufficient documentation

## 2012-05-11 HISTORY — DX: Gastro-esophageal reflux disease without esophagitis: K21.9

## 2012-05-11 LAB — CBC
HCT: 39.4 % (ref 36.0–46.0)
Hemoglobin: 12.9 g/dL (ref 12.0–15.0)
MCHC: 32.7 g/dL (ref 30.0–36.0)
RBC: 4.39 MIL/uL (ref 3.87–5.11)
WBC: 7.6 10*3/uL (ref 4.0–10.5)

## 2012-05-11 LAB — PREGNANCY, URINE: Preg Test, Ur: NEGATIVE

## 2012-05-11 SURGERY — DILATATION & CURETTAGE/HYSTEROSCOPY WITH NOVASURE ABLATION
Anesthesia: General | Site: Uterus | Wound class: Clean Contaminated

## 2012-05-11 MED ORDER — PROPOFOL 10 MG/ML IV EMUL
INTRAVENOUS | Status: AC
  Filled 2012-05-11: qty 20

## 2012-05-11 MED ORDER — ONDANSETRON HCL 4 MG/2ML IJ SOLN
INTRAMUSCULAR | Status: AC
  Filled 2012-05-11: qty 2

## 2012-05-11 MED ORDER — DEXAMETHASONE SODIUM PHOSPHATE 10 MG/ML IJ SOLN
INTRAMUSCULAR | Status: AC
  Filled 2012-05-11: qty 1

## 2012-05-11 MED ORDER — FENTANYL CITRATE 0.05 MG/ML IJ SOLN
25.0000 ug | INTRAMUSCULAR | Status: DC | PRN
  Administered 2012-05-11: 50 ug via INTRAVENOUS

## 2012-05-11 MED ORDER — KETOROLAC TROMETHAMINE 30 MG/ML IJ SOLN
INTRAMUSCULAR | Status: DC | PRN
  Administered 2012-05-11: 30 mg via INTRAVENOUS

## 2012-05-11 MED ORDER — GLYCOPYRROLATE 0.2 MG/ML IJ SOLN
INTRAMUSCULAR | Status: DC | PRN
  Administered 2012-05-11: 0.2 mg via INTRAVENOUS

## 2012-05-11 MED ORDER — FENTANYL CITRATE 0.05 MG/ML IJ SOLN
INTRAMUSCULAR | Status: AC
  Administered 2012-05-11: 50 ug via INTRAVENOUS
  Filled 2012-05-11: qty 2

## 2012-05-11 MED ORDER — MIDAZOLAM HCL 5 MG/5ML IJ SOLN
INTRAMUSCULAR | Status: DC | PRN
  Administered 2012-05-11: 2 mg via INTRAVENOUS

## 2012-05-11 MED ORDER — KETOROLAC TROMETHAMINE 30 MG/ML IJ SOLN: 15.0000 mg | Freq: Once | INTRAMUSCULAR | Status: DC | PRN

## 2012-05-11 MED ORDER — LIDOCAINE HCL (CARDIAC) 20 MG/ML IV SOLN
INTRAVENOUS | Status: DC | PRN
  Administered 2012-05-11: 50 mg via INTRAVENOUS

## 2012-05-11 MED ORDER — FENTANYL CITRATE 0.05 MG/ML IJ SOLN
INTRAMUSCULAR | Status: DC | PRN
  Administered 2012-05-11 (×2): 50 ug via INTRAVENOUS

## 2012-05-11 MED ORDER — METOCLOPRAMIDE HCL 5 MG/ML IJ SOLN
INTRAMUSCULAR | Status: AC
  Administered 2012-05-11: 10 mg via INTRAVENOUS
  Filled 2012-05-11: qty 2

## 2012-05-11 MED ORDER — HYDROCODONE-ACETAMINOPHEN 5-500 MG PO TABS: 1.0000 | ORAL_TABLET | Freq: Four times a day (QID) | ORAL | Status: AC | PRN

## 2012-05-11 MED ORDER — FENTANYL CITRATE 0.05 MG/ML IJ SOLN
INTRAMUSCULAR | Status: AC
  Filled 2012-05-11: qty 2

## 2012-05-11 MED ORDER — MIDAZOLAM HCL 2 MG/2ML IJ SOLN
INTRAMUSCULAR | Status: AC
  Filled 2012-05-11: qty 2

## 2012-05-11 MED ORDER — KETOROLAC TROMETHAMINE 30 MG/ML IJ SOLN
INTRAMUSCULAR | Status: AC
  Filled 2012-05-11: qty 1

## 2012-05-11 MED ORDER — METOCLOPRAMIDE HCL 5 MG/ML IJ SOLN
10.0000 mg | Freq: Once | INTRAMUSCULAR | Status: AC
  Administered 2012-05-11: 10 mg via INTRAVENOUS

## 2012-05-11 MED ORDER — DEXAMETHASONE SODIUM PHOSPHATE 4 MG/ML IJ SOLN
INTRAMUSCULAR | Status: DC | PRN
  Administered 2012-05-11: 10 mg via INTRAVENOUS

## 2012-05-11 MED ORDER — IBUPROFEN 600 MG PO TABS: 600.0000 mg | ORAL_TABLET | Freq: Four times a day (QID) | ORAL | Status: AC | PRN

## 2012-05-11 MED ORDER — LIDOCAINE HCL 1 % IJ SOLN
INTRAMUSCULAR | Status: DC | PRN
  Administered 2012-05-11: 10 mL

## 2012-05-11 MED ORDER — HYDROCODONE-ACETAMINOPHEN 5-325 MG PO TABS
1.0000 | ORAL_TABLET | Freq: Once | ORAL | Status: AC
  Administered 2012-05-11: 1 via ORAL

## 2012-05-11 MED ORDER — LACTATED RINGERS IR SOLN
Status: DC | PRN
  Administered 2012-05-11: 1

## 2012-05-11 MED ORDER — ONDANSETRON HCL 4 MG/2ML IJ SOLN
INTRAMUSCULAR | Status: DC | PRN
  Administered 2012-05-11: 4 mg via INTRAVENOUS

## 2012-05-11 MED ORDER — LACTATED RINGERS IV SOLN
INTRAVENOUS | Status: DC
  Administered 2012-05-11 (×3): via INTRAVENOUS

## 2012-05-11 MED ORDER — HYDROCODONE-ACETAMINOPHEN 5-325 MG PO TABS
ORAL_TABLET | ORAL | Status: AC
  Administered 2012-05-11: 1 via ORAL
  Filled 2012-05-11: qty 1

## 2012-05-11 MED ORDER — PROPOFOL 10 MG/ML IV EMUL
INTRAVENOUS | Status: DC | PRN
  Administered 2012-05-11: 200 mg via INTRAVENOUS

## 2012-05-11 SURGICAL SUPPLY — 12 items
ABLATOR ENDOMETRIAL BIPOLAR (ABLATOR) ×2 IMPLANT
CATH ROBINSON RED A/P 16FR (CATHETERS) ×2 IMPLANT
CLOTH BEACON ORANGE TIMEOUT ST (SAFETY) ×2 IMPLANT
CONTAINER PREFILL 10% NBF 60ML (FORM) ×3 IMPLANT
GLOVE BIO SURGEON STRL SZ7.5 (GLOVE) ×4 IMPLANT
GLOVE BIOGEL PI IND STRL 7.5 (GLOVE) ×1 IMPLANT
GLOVE BIOGEL PI INDICATOR 7.5 (GLOVE) ×1
GOWN STRL REIN XL XLG (GOWN DISPOSABLE) ×3 IMPLANT
NS IRRIG 1000ML POUR BTL (IV SOLUTION) ×1 IMPLANT
PACK HYSTEROSCOPY LF (CUSTOM PROCEDURE TRAY) ×2 IMPLANT
PAD PREP 24X48 CUFFED NSTRL (MISCELLANEOUS) ×2 IMPLANT
TOWEL OR 17X24 6PK STRL BLUE (TOWEL DISPOSABLE) ×4 IMPLANT

## 2012-05-11 NOTE — H&P (View-Only) (Signed)
Bridget Stevens is a 41 y.o. female G1P1 who presents for hysteroscopy with dilatation, curettage and endometrial ablation because of menorrhagia. Since 2009 patient describes her periods as heavy.  They last for seven days, requires a pad change every 1.5 hours and produces moderate size clots.  She also has cramping rated at a 9/10 on a 10 point pain scale and decreases only to 7/10 with over-the-counter analgesia.  A pelvic ultrasound in may of 2013 was normal with the uterus measuring 79 x 3.9 x 4.1 cm.  She denies any intermenstrual bleeding, dyspareunia, urinary tract symptoms or vaginitis symptoms.  She admits to occasional constipation. Patient was found to have a normal TSH, prolactin and CBC in May 2013.  A review of both medical and surgical management options were given to the patient but she has decided to proceed with a hysteroscopy, dilatation, curettage and endometrial ablation.  Past Medical History  OB History: G1P1 C-section 2009  GYN History: menarche 41 YO   LMP  04/19/12   Contraception: tubal sterilization   The patient denies history of sexually transmitted disease.    Last PAP smear- 2012 was normal  Medical History: vitamin D deficiency, anxiety, migraines, hyperlipidemia, gastroesophageal reflux disease,   Surgical History: 2009 Tubal Sterilization States that after C-section she had to be "revived twice" afterwards and patient states that she "felt" her entire C-section (had epidural per patient).  Admits to a transfusion during C-section.   Family History: cancer, diabetes mellitus, hypertension and celiac disease  Social History: Divorced, unemployed;  denies tobacco and illicit drug use but admits to occasional alcohol intake   Outpatient Encounter Prescriptions as of 05/04/2012 Medication Sig Dispense Refill . ALPRAZolam (XANAX) 1 MG tablet Take 1 mg by mouth daily as needed.     . FLUoxetine (PROZAC) 40 MG capsule Take 40 mg by mouth daily.     . omeprazole  (PRILOSEC) 20 MG capsule Take 20 mg by mouth daily.     . Vitamin D, Ergocalciferol, (DRISDOL) 50000 UNITS CAPS Take 50,000 Units by mouth.       Allergies Allergen Reactions . Peanut-Containing Drug Products Swelling . Percocet (Oxycodone-Acetaminophen) Other (See Comments)   PT STATES THAT SHE HAS HEADACHES WITH THIS MED . Shellfish Allergy  . Sulfa Antibiotics Hives Denies problems with latex, soy products and adhesives.  Patient is able to tolerate Vicodin  ROS: admits to  reading glasses;  denies headache, vision changes, dysphagia, tinnitus, dizziness,  chest pain, shortness of breath, nausea, vomiting, diarrhea, dysuria, hematuria, pelvic pain, swelling of joints,easy bruising,  myalgias, arthralgias, skin rashes and except as is mentioned in the history of present illness, patient's review of systems is otherwise negative   Physical Exam    BP 116/78  Pulse 72  Temp 98.1 F (36.7 C) (Oral)  Resp 16  Ht 5' (1.524 m)  Wt 209 lb (94.802 kg)  BMI 40.82 kg/m2  LMP 04/19/2012  Neck: supple without masses or thyromegaly Lungs: clear to auscultation Heart: regular rate and rhythm Abdomen: soft, non-tender and no organomegaly Pelvic:EGBUS- wnl; vagina-normal rugae; uterus-normal size, cervix without lesions or motion tenderness; adnexae-no tenderness or masses Extremities:  no clubbing, cyanosis or edema   Assesment: Menorrhagia   Disposition:  A discussion was held with patient regarding the indication for her procedure(s) along with the risks, which include but are not limited to: reaction to anesthesia, damage to adjacent organs, infection, scarring and excessive bleeding. Patient verbalized understanding of these risks and has consented to   proceed with hysteroscopy with D & C and endometrial ablation at [facility], May 11, 2012 @ 1 p.m.  CSN# [id number]   Amana Bouska J. Ulis Kaps, PA-C  for Dr. Angela Y. Roberts   

## 2012-05-11 NOTE — Op Note (Signed)
Preop Diagnosis: Menorrhagia   Postop Diagnosis: Menorrhagia   Procedure: DILATATION & CURETTAGE/HYSTEROSCOPY WITH NOVASURE ABLATION   Anesthesia: General   Anesthesiologist: Dana Allan, MD   Attending: Purcell Nails, MD   Assistant: N/a  Findings: No obvious endometrial lesions.  Uterine sound 9cm, Cervical length 3cm, Cavity length 6cm, Cavity Width 4.7cm, Power 155 watts, Ablation time 66 secs  Pathology: Endometrial Currettings  Fluids: See flowsheet  UOP: QS via straight cath prior to procedure  EBL: Minimal  Complications: None  Procedure:The patient was taken to the operating room after the risks, benefits and alternatives discussed with the patient. The patient verbalized understanding and consent signed and witnessed. The patient was given a spinal per anesthesia and prepped and draped in the normal sterile fashion and Time Out performed per protocol. A bivalve speculum was placed in the patient's vagina and the anterior lip of the cervix was grasped with a single tooth tenaculum. A paracervical block was administered using a total of 10 cc of 1% lidocaine. The uterus sounded to 9 cm. The cervix was dilated for passage of the hysteroscope. The hysteroscope was introduced into the uterine cavity and findings as noted above. Sharp curettage was performed until a gritty texture was noted and currettings sent to pathology. The hysteroscope was reintroduced and no obvious remaining intracavitary lesions were noted. The Novasure instrument was introduced and ablation performed without difficulty after successful cavity assessment.  Hysteroscope was reintroduced and good ablation results were noted. All instruments were removed. Sponge lap and needle count was correct. The patient tolerated the procedure well and was returned to the recovery room in good condition.

## 2012-05-11 NOTE — Anesthesia Preprocedure Evaluation (Addendum)
Anesthesia Evaluation  Patient identified by MRN, date of birth, ID band Patient awake    Reviewed: Allergy & Precautions, H&P , NPO status , Patient's Chart, lab work & pertinent test results, reviewed documented beta blocker date and time   History of Anesthesia Complications Negative for: history of anesthetic complications  Airway Mallampati: I TM Distance: >3 FB Neck ROM: full    Dental  (+) Teeth Intact   Pulmonary asthma (hasn't had an inhaler in years) ,  breath sounds clear to auscultation  Pulmonary exam normal       Cardiovascular Exercise Tolerance: Good Rhythm:regular Rate:Normal  High cholesterol   Neuro/Psych  Headaches (occasional migraines, last 1-2 months ago), PSYCHIATRIC DISORDERS (anxiety - on xanax and prozac.  Reports lots of psychosocial stress lately)    GI/Hepatic Neg liver ROS, GERD- (prilosec daily)  Medicated and Controlled,  Endo/Other  Morbid obesity  Renal/GU negative Renal ROS  Female GU complaint     Musculoskeletal   Abdominal   Peds  Hematology negative hematology ROS (+)   Anesthesia Other Findings Complains of CP and SOB - relates to stress, but does occur with activity.  Will obtain EKG.  Reproductive/Obstetrics negative OB ROS                         Anesthesia Physical Anesthesia Plan  ASA: III  Anesthesia Plan: General LMA   Post-op Pain Management:    Induction:   Airway Management Planned:   Additional Equipment:   Intra-op Plan:   Post-operative Plan:   Informed Consent: I have reviewed the patients History and Physical, chart, labs and discussed the procedure including the risks, benefits and alternatives for the proposed anesthesia with the patient or authorized representative who has indicated his/her understanding and acceptance.   Dental Advisory Given  Plan Discussed with: CRNA and Surgeon  Anesthesia Plan Comments:          Anesthesia Quick Evaluation

## 2012-05-11 NOTE — Anesthesia Postprocedure Evaluation (Signed)
Anesthesia Post Note  Patient: Bridget Stevens  Procedure(s) Performed: Procedure(s) (LRB): DILATATION & CURETTAGE/HYSTEROSCOPY WITH NOVASURE ABLATION (N/A)  Anesthesia type: GA  Patient location: PACU  Post pain: Pain level controlled  Post assessment: Post-op Vital signs reviewed  Last Vitals:  Filed Vitals:   05/11/12 1202  BP: 136/77  Pulse:   Temp:   Resp:     Post vital signs: Reviewed  Level of consciousness: sedated  Complications: No apparent anesthesia complications

## 2012-05-11 NOTE — Anesthesia Procedure Notes (Signed)
Procedure Name: LMA Insertion Date/Time: 05/11/2012 1:16 PM Performed by: Isabella Bowens R Pre-anesthesia Checklist: Patient identified, Patient being monitored, Emergency Drugs available, Timeout performed and Suction available Patient Re-evaluated:Patient Re-evaluated prior to inductionOxygen Delivery Method: Circle system utilized Preoxygenation: Pre-oxygenation with 100% oxygen Intubation Type: IV induction Ventilation: Mask ventilation without difficulty LMA: LMA inserted Number of attempts: 1 Placement Confirmation: positive ETCO2 and breath sounds checked- equal and bilateral Dental Injury: Teeth and Oropharynx as per pre-operative assessment  Difficulty Due To: Difficulty was unanticipated

## 2012-05-11 NOTE — Transfer of Care (Signed)
Immediate Anesthesia Transfer of Care Note  Patient: Bridget Stevens  Procedure(s) Performed: Procedure(s) (LRB): DILATATION & CURETTAGE/HYSTEROSCOPY WITH NOVASURE ABLATION (N/A)  Patient Location: PACU  Anesthesia Type: General  Level of Consciousness: awake  Airway & Oxygen Therapy: Patient Spontanous Breathing and Patient connected to nasal cannula oxygen  Post-op Assessment: Report given to PACU RN and Post -op Vital signs reviewed and stable  Post vital signs: Reviewed and stable  Complications: No apparent anesthesia complications

## 2012-05-11 NOTE — Interval H&P Note (Signed)
History and Physical Interval Note:  05/11/2012 12:53 PM  Bridget Stevens  has presented today for surgery, with the diagnosis of menorrhagia  The various methods of treatment have been discussed with the patient and family. After consideration of risks, benefits and other options for treatment, the patient has consented to  Procedure(s) (LRB): DILATATION & CURETTAGE/HYSTEROSCOPY WITH NOVASURE ABLATION (N/A) as a surgical intervention .  The patient's history has been reviewed, patient examined, no change in status, stable for surgery.  I have reviewed the patient's chart and labs.  Questions were answered to the patient's satisfaction.     [name] Y  R/B/A discussed incl but not limited to B/I/I.  Consent s/w and questions answered.  Pt agrees to proceed with Hyst/D&C/Ablation.

## 2012-05-29 ENCOUNTER — Ambulatory Visit (INDEPENDENT_AMBULATORY_CARE_PROVIDER_SITE_OTHER): Payer: Medicaid Other | Admitting: Obstetrics and Gynecology

## 2012-05-29 ENCOUNTER — Encounter: Payer: Self-pay | Admitting: Obstetrics and Gynecology

## 2012-05-29 VITALS — BP 112/74 | Wt 211.0 lb

## 2012-05-29 DIAGNOSIS — Z9889 Other specified postprocedural states: Secondary | ICD-10-CM | POA: Insufficient documentation

## 2012-05-29 NOTE — Progress Notes (Signed)
DATE OF SURGERY: 05/11/2012 TYPE OF SURGERY:Hysteroscopy d and c  Ablation  PAIN:Yes not as bad  VAG BLEEDING: no VAG DISCHARGE: no NORMAL GI FUNCTN: yes NORMAL GU FUNCTN: yes  Path - benign  No complaints.  Many of psychosocial issues  Filed Vitals:   05/29/12 1518  BP: 112/74   ROS: noncontributory  Pelvic exam:  VULVA: normal appearing vulva with no masses, tenderness or lesions,  VAGINA: normal appearing vagina with normal color and discharge, no lesions, CERVIX: normal appearing cervix without discharge or lesions,  UTERUS: uterus is normal size, shape, consistency and nontender,  ADNEXA: normal adnexa in size, nontender and no masses.  A/P Doing well s/p Hyst/D&C/Ablation AEX - 10/2012 Mammo

## 2012-06-01 ENCOUNTER — Other Ambulatory Visit: Payer: Self-pay

## 2012-06-01 ENCOUNTER — Telehealth: Payer: Self-pay | Admitting: Obstetrics and Gynecology

## 2012-06-01 DIAGNOSIS — Z1231 Encounter for screening mammogram for malignant neoplasm of breast: Secondary | ICD-10-CM

## 2012-06-01 NOTE — Telephone Encounter (Signed)
Order in for mammogram  °

## 2012-06-05 ENCOUNTER — Ambulatory Visit: Payer: Medicaid Other

## 2012-06-15 ENCOUNTER — Ambulatory Visit
Admission: RE | Admit: 2012-06-15 | Discharge: 2012-06-15 | Disposition: A | Discharge status: Home / Self Care | Payer: Medicaid Other | Source: Ambulatory Visit | Attending: Obstetrics and Gynecology | Admitting: Obstetrics and Gynecology

## 2012-06-15 DIAGNOSIS — Z1231 Encounter for screening mammogram for malignant neoplasm of breast: Secondary | ICD-10-CM

## 2012-06-23 ENCOUNTER — Other Ambulatory Visit: Payer: Self-pay | Admitting: Obstetrics and Gynecology

## 2012-06-23 DIAGNOSIS — R928 Other abnormal and inconclusive findings on diagnostic imaging of breast: Secondary | ICD-10-CM

## 2012-06-30 ENCOUNTER — Ambulatory Visit
Admission: RE | Admit: 2012-06-30 | Discharge: 2012-06-30 | Disposition: A | Discharge status: Home / Self Care | Payer: Medicaid Other | Source: Ambulatory Visit | Attending: Obstetrics and Gynecology | Admitting: Obstetrics and Gynecology

## 2012-06-30 DIAGNOSIS — R928 Other abnormal and inconclusive findings on diagnostic imaging of breast: Secondary | ICD-10-CM

## 2012-07-20 ENCOUNTER — Emergency Department (HOSPITAL_COMMUNITY)
Admission: EM | Admit: 2012-07-20 | Discharge: 2012-07-20 | Disposition: A | Discharge status: Home / Self Care | Payer: Medicaid Other | Attending: Emergency Medicine | Admitting: Emergency Medicine

## 2012-07-20 ENCOUNTER — Emergency Department (HOSPITAL_COMMUNITY): Payer: Medicaid Other

## 2012-07-20 ENCOUNTER — Encounter (HOSPITAL_COMMUNITY): Payer: Self-pay | Admitting: Nurse Practitioner

## 2012-07-20 DIAGNOSIS — E079 Disorder of thyroid, unspecified: Secondary | ICD-10-CM | POA: Insufficient documentation

## 2012-07-20 DIAGNOSIS — M25559 Pain in unspecified hip: Secondary | ICD-10-CM | POA: Insufficient documentation

## 2012-07-20 DIAGNOSIS — M533 Sacrococcygeal disorders, not elsewhere classified: Secondary | ICD-10-CM

## 2012-07-20 DIAGNOSIS — M25552 Pain in left hip: Secondary | ICD-10-CM

## 2012-07-20 DIAGNOSIS — J45909 Unspecified asthma, uncomplicated: Secondary | ICD-10-CM | POA: Insufficient documentation

## 2012-07-20 LAB — URINALYSIS, ROUTINE W REFLEX MICROSCOPIC
Glucose, UA: NEGATIVE mg/dL
Hgb urine dipstick: NEGATIVE
Ketones, ur: NEGATIVE mg/dL
Protein, ur: NEGATIVE mg/dL

## 2012-07-20 LAB — URINE MICROSCOPIC-ADD ON

## 2012-07-20 IMAGING — CR DG LUMBAR SPINE COMPLETE 4+V
5 series · 5 of 5 positions shown · non-contrast
Comparison: Plain films abdomen [DATE]. Images from lower
lumbar discogram [DATE] reviewed.

CLINICAL DATA: Left hip pain for 1 week.

LUMBAR SPINE - COMPLETE 4+ VIEW

[t l-spine a.p.]
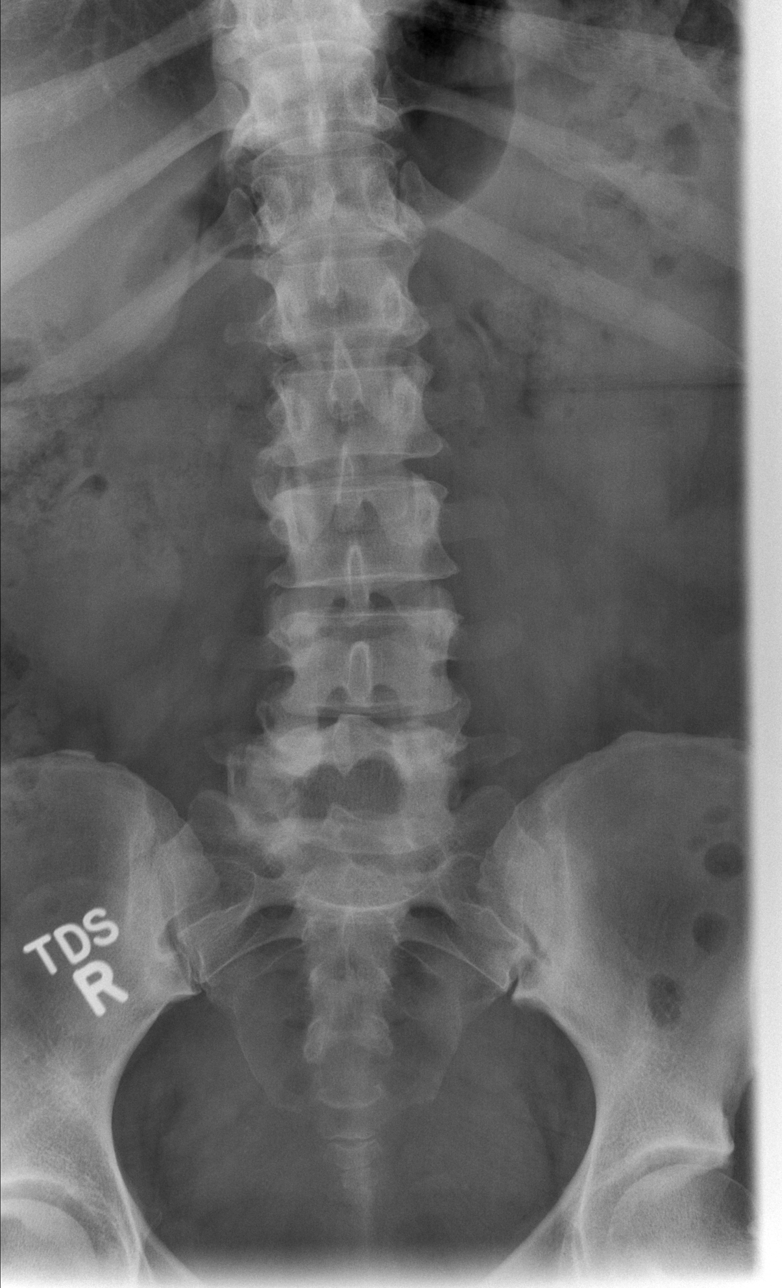

[t l-spine oblique exposure (1 of 2)]
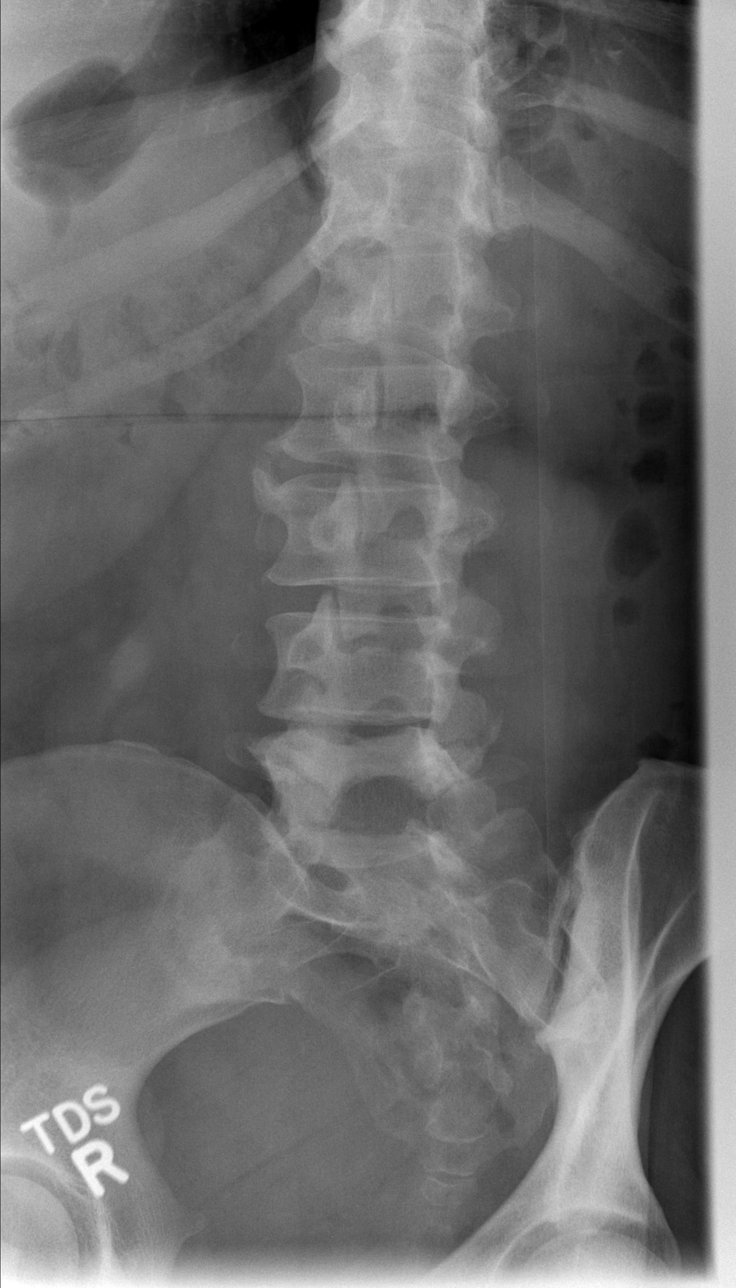

[t l-spine oblique exposure (2 of 2)]
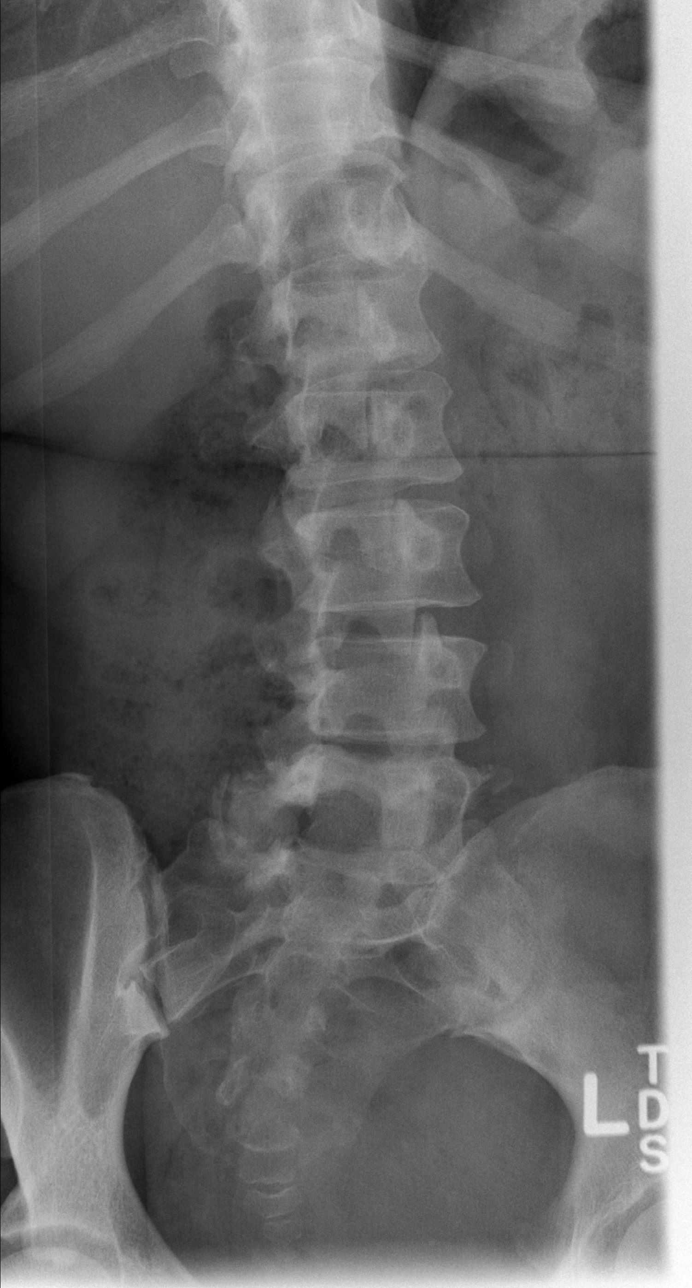

[t l-spine lat]
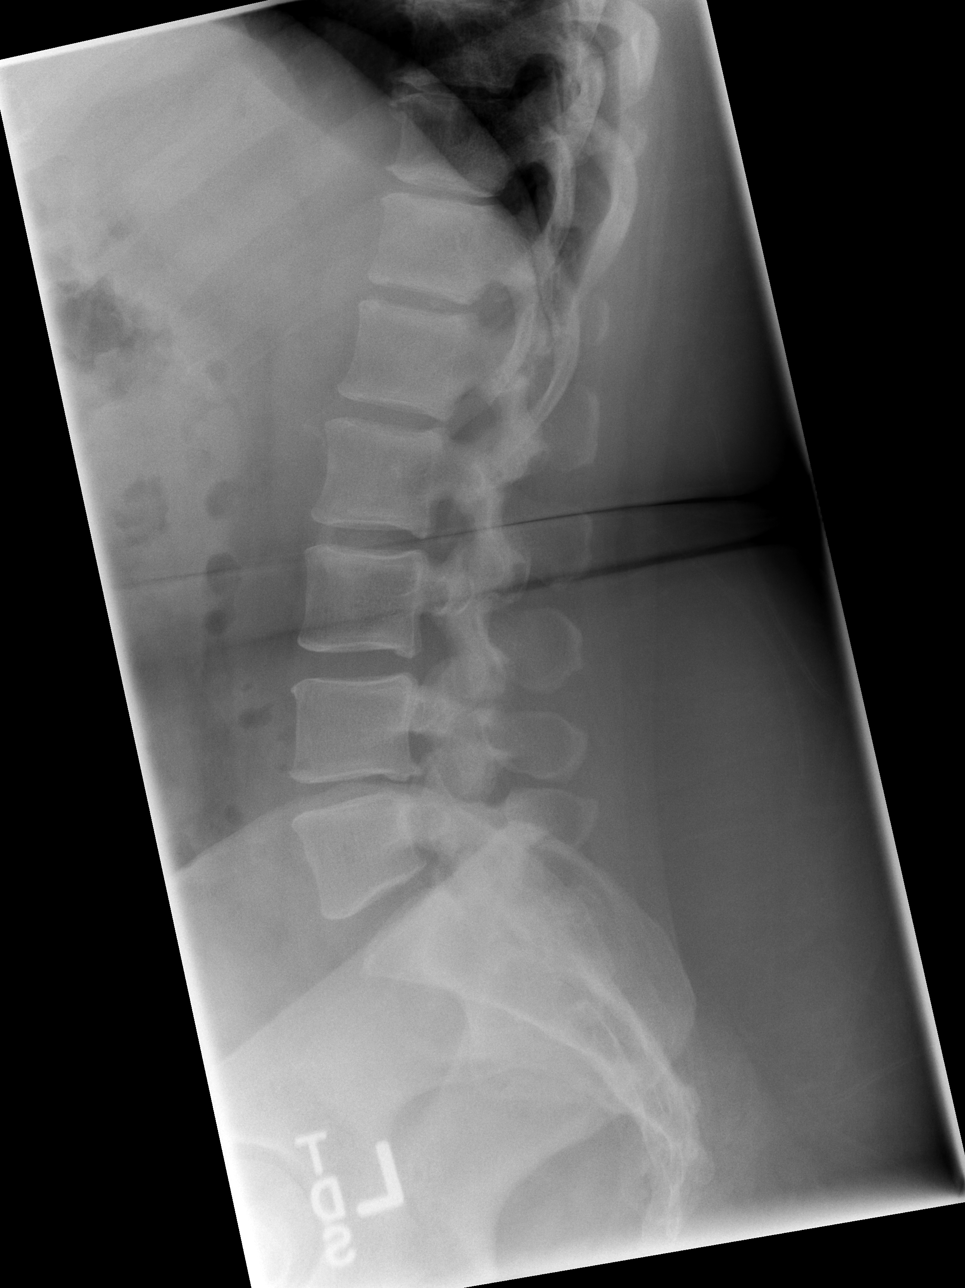

[t l-spine l5-s1 spot]
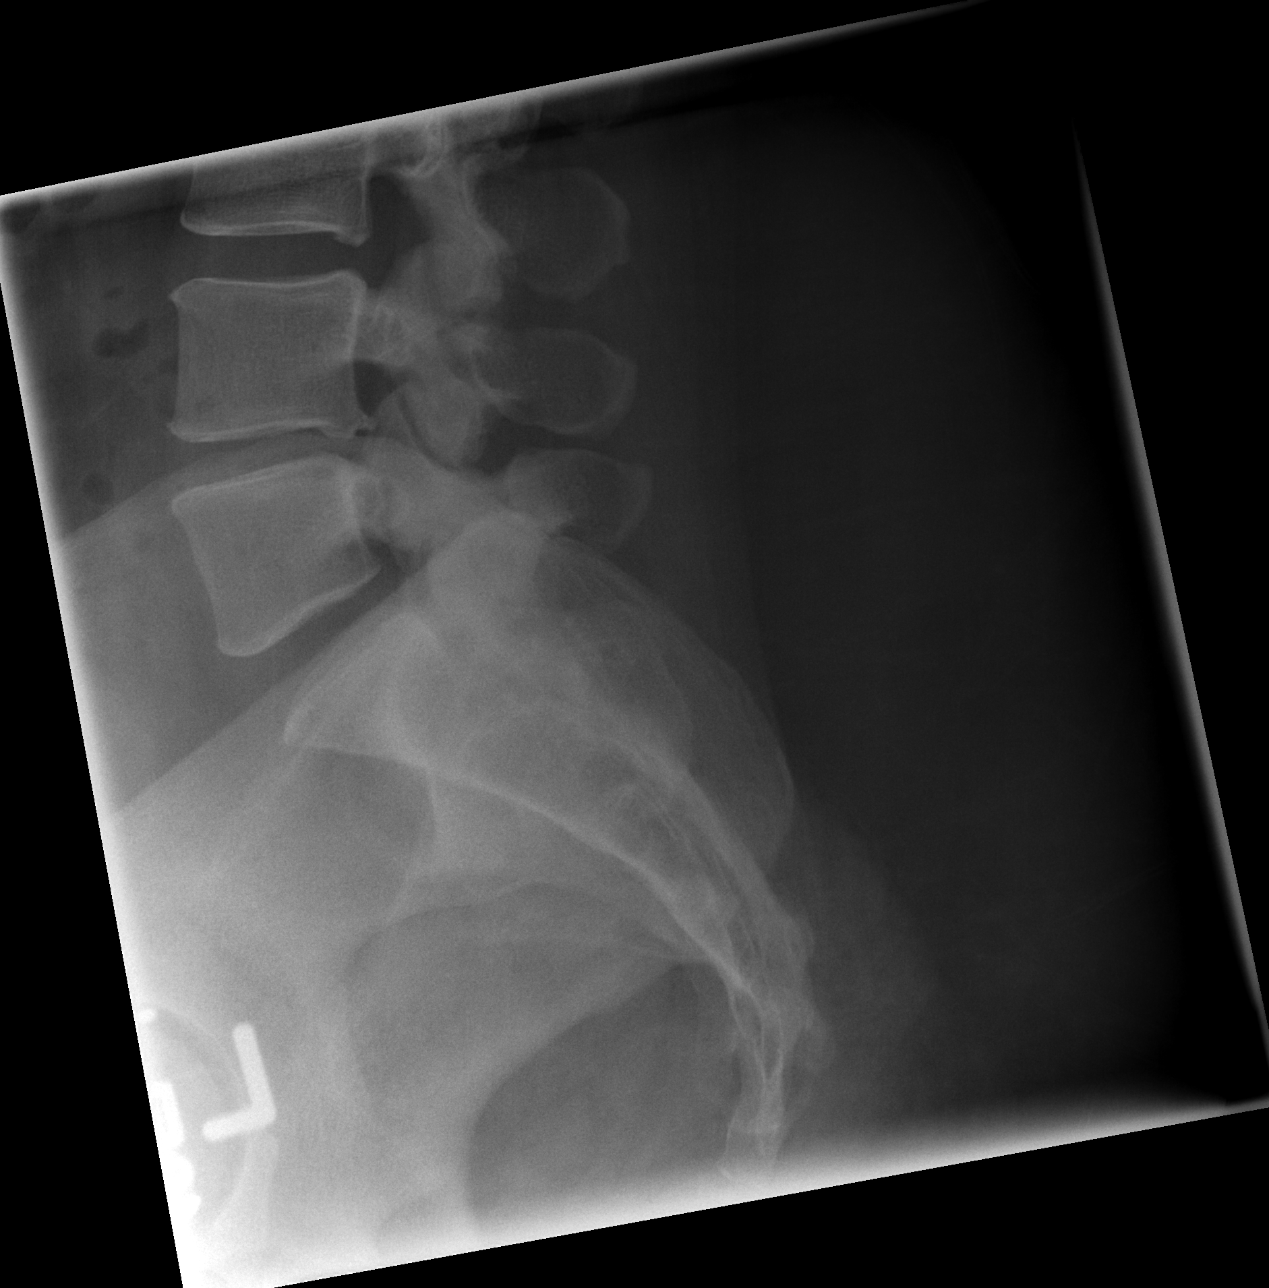

[5 of 5 positions shown; findings below may reference images not displayed]

FINDINGS: Mild convex left curvature may be positional.  Vertebral
body height and alignment are maintained.  There is loss of disc
space height and endplate spurring at L4-5.  Facet degenerative
disease is most notable at L4-5 and L5-S1.  Anterior plate spurring
is seen in the lower thoracic spine.  Paraspinous structures are
unremarkable.
IMPRESSION: 1.  No acute finding.
2.  Degenerative disc disease appears worst at L4-5.

## 2012-07-20 MED ORDER — MELOXICAM 15 MG PO TABS: 15.0000 mg | ORAL_TABLET | Freq: Every day | ORAL | Status: DC

## 2012-07-20 MED ORDER — VITAMIN D (ERGOCALCIFEROL) 1.25 MG (50000 UNIT) PO CAPS: 50000.0000 [IU] | ORAL_CAPSULE | ORAL | Status: DC

## 2012-07-20 MED ORDER — PREDNISONE 20 MG PO TABS: ORAL_TABLET | ORAL | Status: DC

## 2012-07-20 MED ORDER — IBUPROFEN 400 MG PO TABS: 600.0000 mg | ORAL_TABLET | Freq: Four times a day (QID) | ORAL | Status: DC | PRN

## 2012-07-20 MED ORDER — ALPRAZOLAM 0.25 MG PO TABS: 1.0000 mg | ORAL_TABLET | Freq: Every day | ORAL | Status: DC | PRN

## 2012-07-20 NOTE — ED Notes (Signed)
Patient transported to X-ray 

## 2012-07-20 NOTE — ED Notes (Signed)
C/o "shooting pain" L hip intermittent over past few days. Denies any injuries. Ambulatory, mae. Taking ibuprofen with no relief

## 2012-07-20 NOTE — ED Notes (Signed)
Awaiting UA results prior to d/c

## 2012-07-23 NOTE — ED Provider Notes (Signed)
History     CSN: [id number]  Arrival date & time 07/20/12  4098   First MD Initiated Contact with Patient 07/20/12 [phone]      Chief Complaint  Patient presents with  . Hip Pain    (Consider location/radiation/quality/duration/timing/severity/associated sxs/prior treatment) HPI Comments: ANONA GIOVANNINI 41 y.o. female   The chief complaint is: Patient presents with:   Hip Pain  Past Medical History: Back pain                    Patient with c/o intemittent shooting pain over past week. No known triggers.  10/10 when it shoots, 0/10 otherwise. Right side wraps around the front of the leg in the L3/L4 nerve distribution. Tried ibuprofen without relief.  No known MOI.  Denies sxs cauda equina. Denies sxs of meningitis. Denies recent procedures to back.  Denies paresis, dystaxia, or paresthsias.  Does not affect ADLs. RF include obesity and sedentary lifestyle.  Patient is a 41 y.o. female presenting with hip pain. The history is provided by the patient. No language interpreter was used.  Hip Pain This is a new problem. The current episode started in the past 7 days. The problem occurs intermittently. The problem has been unchanged. Pertinent negatives include no abdominal pain, anorexia, arthralgias, change in bowel habit, chest pain, chills, congestion, coughing, diaphoresis, fatigue, fever, headaches, joint swelling, myalgias, nausea, neck pain, numbness, rash, sore throat, swollen glands, urinary symptoms, vertigo, visual change, vomiting or weakness. Nothing aggravates the symptoms. She has tried NSAIDs for the symptoms. The treatment provided no relief.    Past Medical History  Diagnosis Date  . Anxiety   . Asthma   . Thyroid disease   . Hx of migraines   . Hyperlipidemia   . GERD (gastroesophageal reflux disease)     Past Surgical History  Procedure Date  . Cesarean section   . Btl 07/30/2008  . Wisdom tooth extraction   . Tubal ligation     Family History    Problem Relation Age of Onset  . Diabetes Father   . Hypertension Father   . Hyperlipidemia Father   . Thyroid disease Father   . Asthma Father   . Cancer Mother   . Hypertension Mother   . Thyroid disease Mother   . Cancer Sister     CERVICAL  . Thyroid disease Sister   . Asthma Sister   . Asthma Brother     History  Substance Use Topics  . Smoking status: Never Smoker   . Smokeless tobacco: Never Used  . Alcohol Use: Yes     occasionally liquour or beer    OB History    Grav Para Term Preterm Abortions TAB SAB Ect Mult Living   1 1        1       Review of Systems  Unable to perform ROS Constitutional: Negative for fever, chills, diaphoresis and fatigue.  HENT: Negative for congestion, sore throat and neck pain.   Respiratory: Negative for cough.   Cardiovascular: Negative for chest pain.  Gastrointestinal: Negative for nausea, vomiting, abdominal pain, anorexia and change in bowel habit.  Genitourinary: Negative for dysuria, urgency, frequency, hematuria and flank pain.  Musculoskeletal: Negative for myalgias, joint swelling and arthralgias.  Skin: Negative for rash.  Neurological: Negative for vertigo, weakness, numbness and headaches.  All other systems reviewed and are negative.    Allergies  Shellfish allergy; Peanut-containing drug products; Percocet; and Sulfa antibiotics  Home Medications  Current Outpatient Rx  Name Route Sig Dispense Refill  . ALPRAZOLAM 1 MG PO TABS Oral Take 1 mg by mouth daily as needed. anxiety    . IBUPROFEN 600 MG PO TABS Oral Take 600 mg by mouth every 6 (six) hours as needed. For pain    . MELOXICAM 15 MG PO TABS Oral Take 15 mg by mouth daily.    Marland Kitchen VITAMIN D (ERGOCALCIFEROL) 50000 UNITS PO CAPS Oral Take 50,000 Units by mouth every 7 (seven) days.     Marland Kitchen PREDNISONE 20 MG PO TABS  3 tabs po day one, then 2 po daily x 4 days 11 tablet 0    BP 108/69  Pulse 69  Temp 97.1 F (36.2 C) (Oral)  Resp 18  SpO2 100%  LMP  05/03/2012  Physical Exam  Constitutional: She is oriented to person, place, and time. She appears well-developed and well-nourished. No distress.  HENT:  Head: Normocephalic and atraumatic.  Eyes: Conjunctivae normal and EOM are normal. Pupils are equal, round, and reactive to light. No scleral icterus.  Neck: Normal range of motion.  Cardiovascular: Normal rate, regular rhythm, normal heart sounds and intact distal pulses.  Exam reveals no gallop and no friction rub.   No murmur heard. Pulmonary/Chest: Effort normal and breath sounds normal. No respiratory distress.  Abdominal: Soft. Bowel sounds are normal. She exhibits no distension and no mass. There is no tenderness. There is no guarding.  Musculoskeletal: Normal range of motion. She exhibits tenderness (ttp of right SI joint.). She exhibits no edema.  Lymphadenopathy:    She has no cervical adenopathy.  Neurological: She is alert and oriented to person, place, and time. She has normal reflexes. No cranial nerve deficit. Coordination normal.  Skin: Skin is warm and dry. No rash noted. She is not diaphoretic.  Psychiatric: She has a normal mood and affect. Her behavior is normal. Judgment and thought content normal.    ED Course  Procedures (including critical care time)  Labs Reviewed  URINALYSIS, ROUTINE W REFLEX MICROSCOPIC - Abnormal; Notable for the following:    APPearance CLOUDY (*)     Leukocytes, UA SMALL (*)     All other components within normal limits  URINE MICROSCOPIC-ADD ON - Abnormal; Notable for the following:    Squamous Epithelial / LPF FEW (*)     Bacteria, UA FEW (*)     All other components within normal limits  LAB REPORT - SCANNED   No results found.   1. Sacro-iliac pain   2. Hip pain, left       MDM  patient w/o red flag sxs. ttp of right ssi joint.  I do not feel imaging is necessary as no known MOI. Patient with meloxicam at home.  Will give patient predinosone as she may have bulging disc  that is causing sxs.  Patient is able to ambulate well. Discussed reasons to seek immediate care. Patient expresses understanding and agrees with plan.         Arthor Captain, PA-C 07/23/12 1106

## 2012-07-25 NOTE — ED Provider Notes (Signed)
Medical screening examination/treatment/procedure(s) were performed by non-physician practitioner and as supervising physician I was immediately available for consultation/collaboration.   Gavin Pound. Shantay Sonn, MD 07/25/12 2352

## 2012-09-01 ENCOUNTER — Other Ambulatory Visit: Payer: Self-pay | Admitting: Internal Medicine

## 2012-09-01 DIAGNOSIS — R109 Unspecified abdominal pain: Secondary | ICD-10-CM

## 2012-09-07 ENCOUNTER — Ambulatory Visit
Admission: RE | Admit: 2012-09-07 | Discharge: 2012-09-07 | Disposition: A | Discharge status: Home / Self Care | Payer: Medicaid Other | Source: Ambulatory Visit | Attending: Internal Medicine | Admitting: Internal Medicine

## 2012-09-07 DIAGNOSIS — R109 Unspecified abdominal pain: Secondary | ICD-10-CM

## 2012-09-07 IMAGING — US US ABDOMEN COMPLETE
1 series · 14 of 25 positions shown · non-contrast
Comparison: None.

CLINICAL DATA: Abdominal pain.

COMPLETE ABDOMINAL ULTRASOUND

[Series 1: us abdomen complete · 0.29mm/px · 14 of 81 slices shown]
[im 1/81]
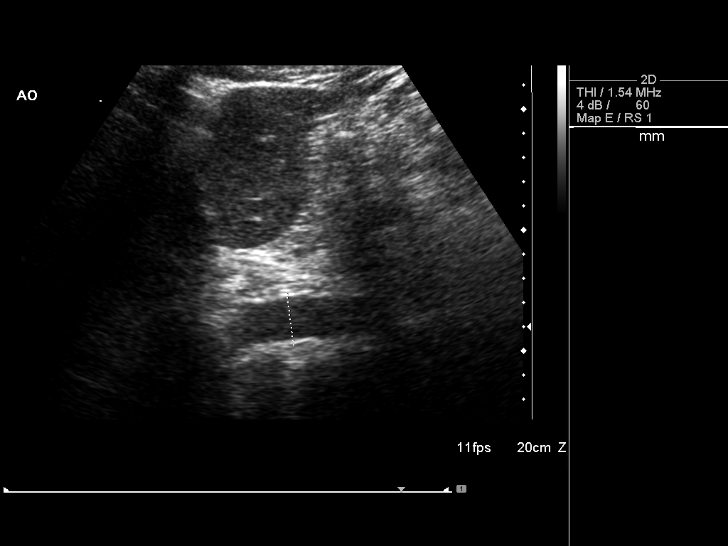
[im 7/81]
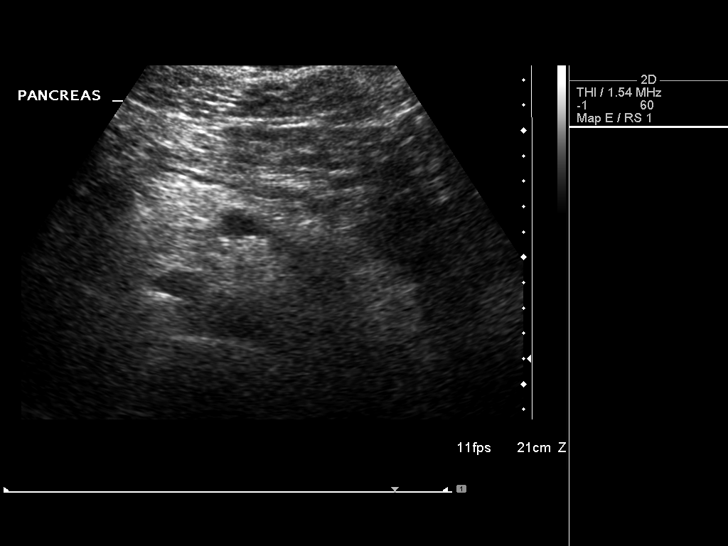
[im 14/81]
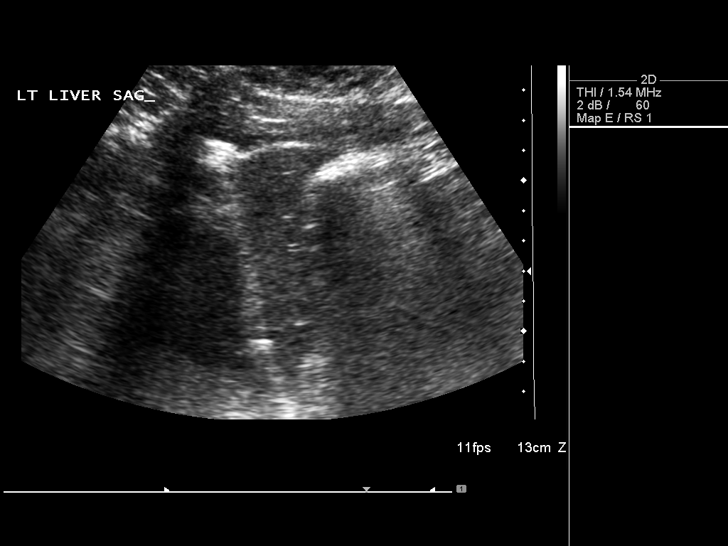
[im 21/81]
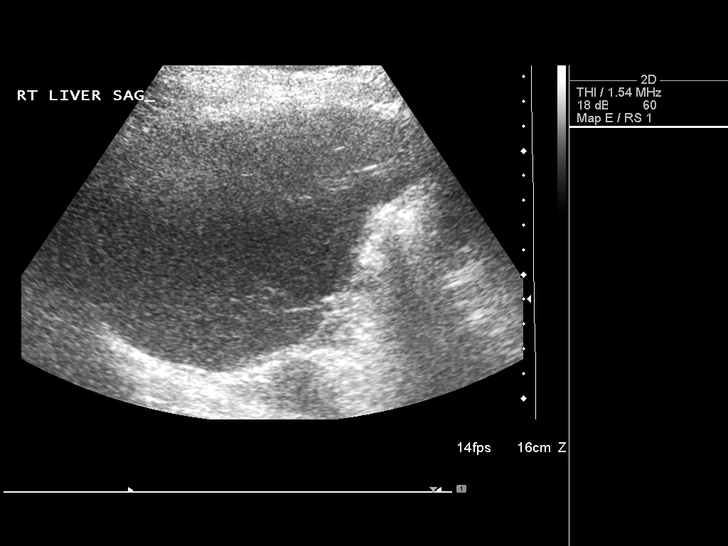
[im 27/81]
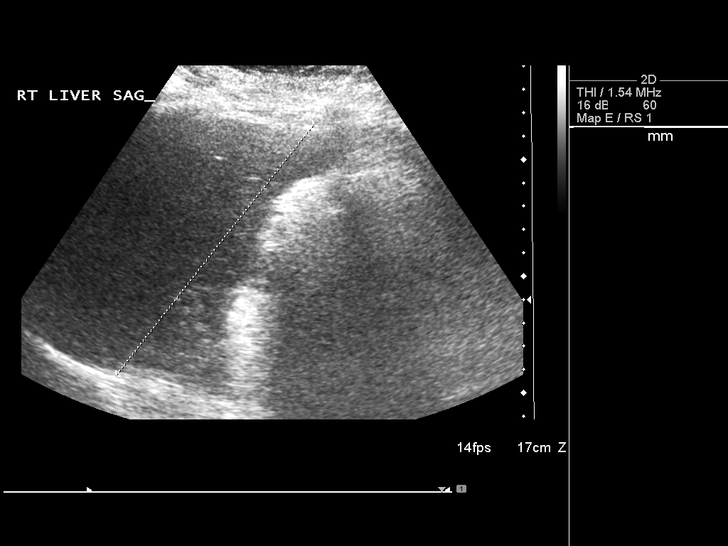
[im 31/81]
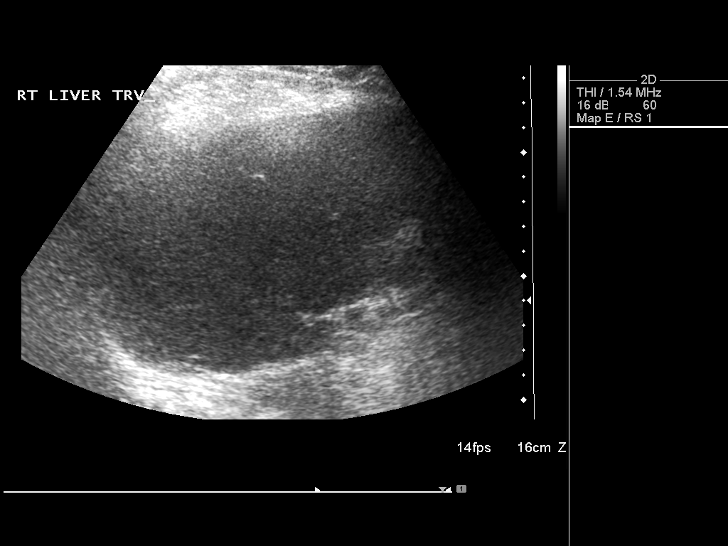
[im 37/81]
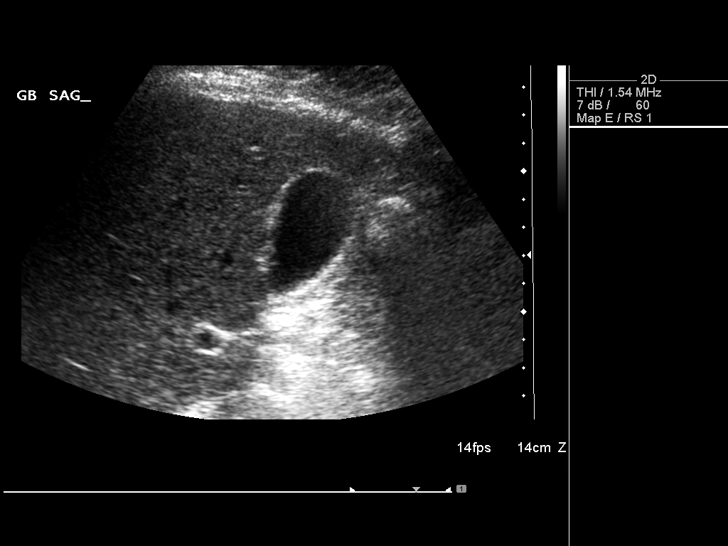
[im 44/81]
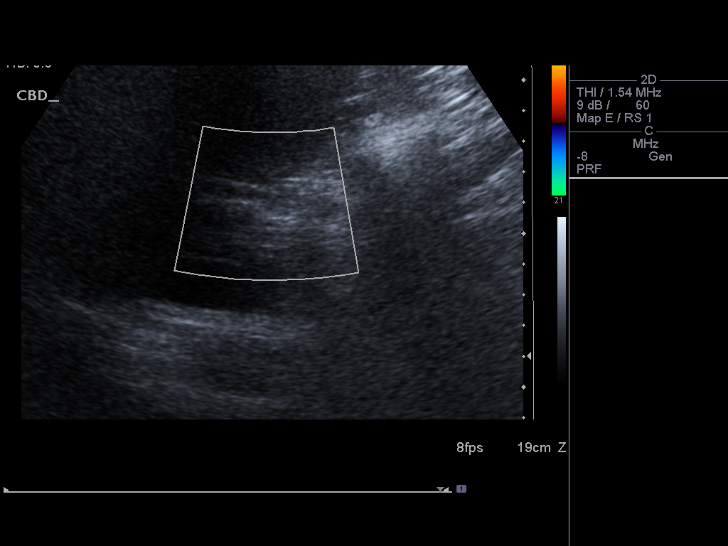
[im 51/81]
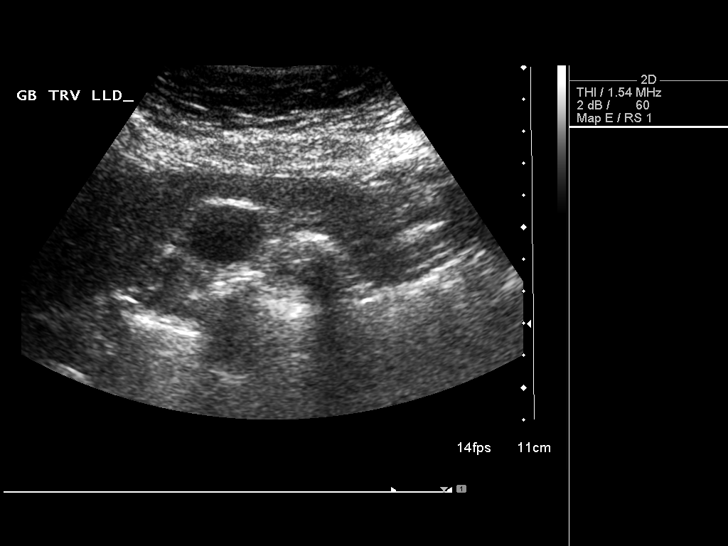
[im 54/81]
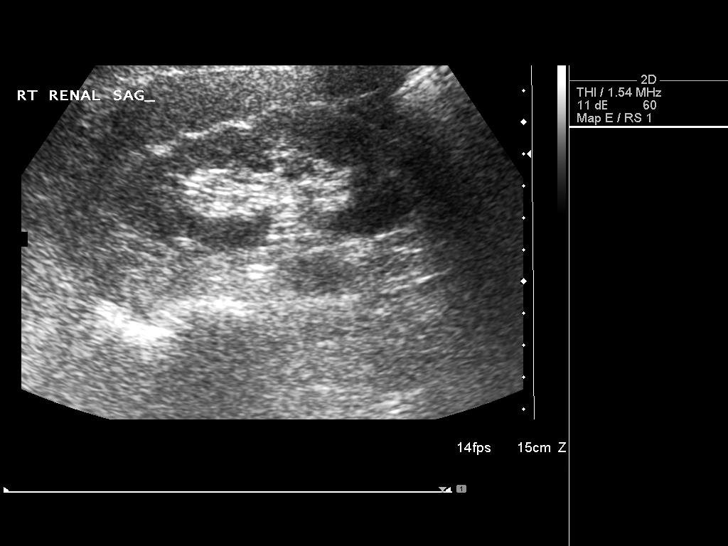
[im 61/81]
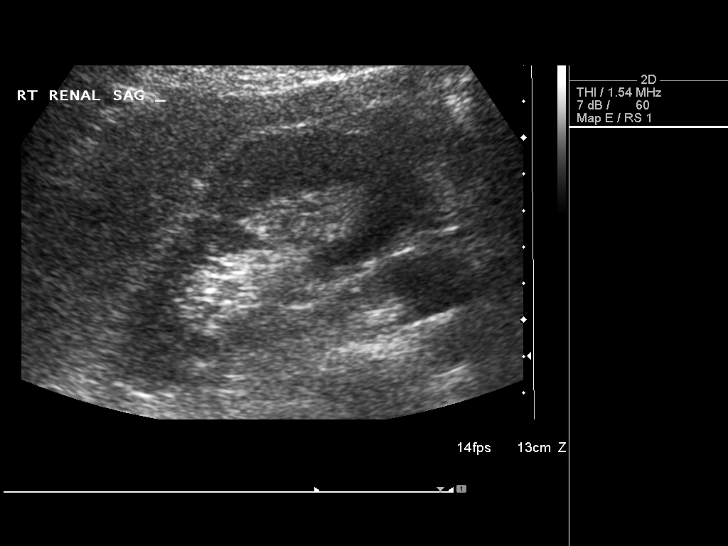
[im 67/81]
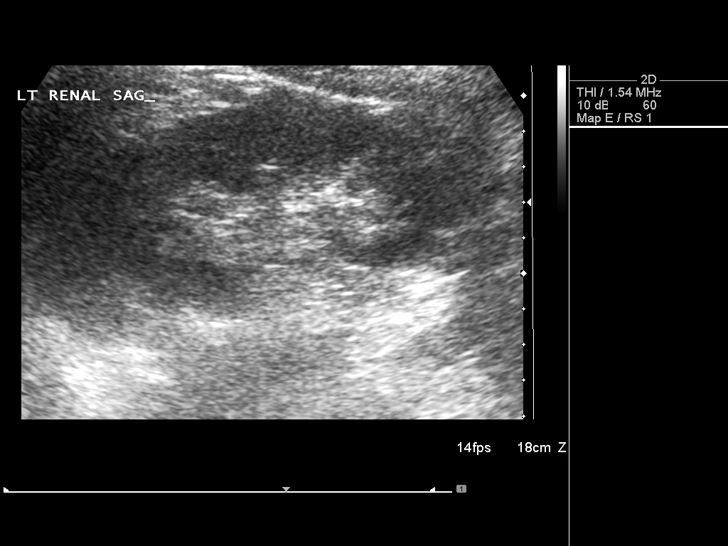
[im 74/81]
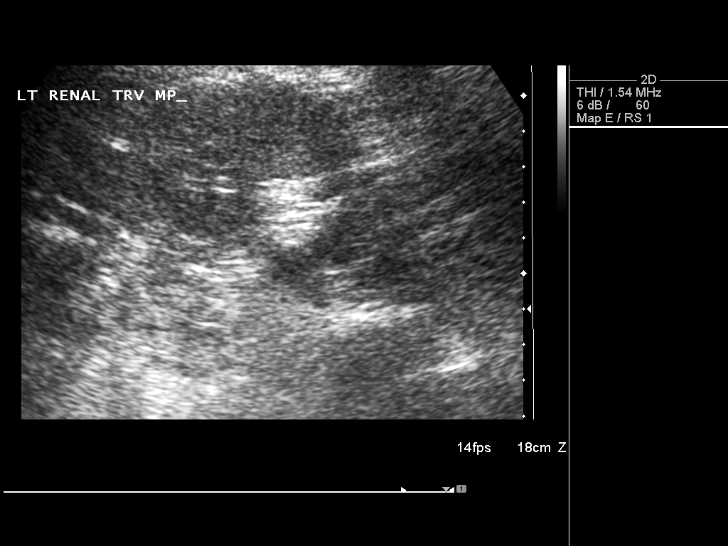
[im 81/81]
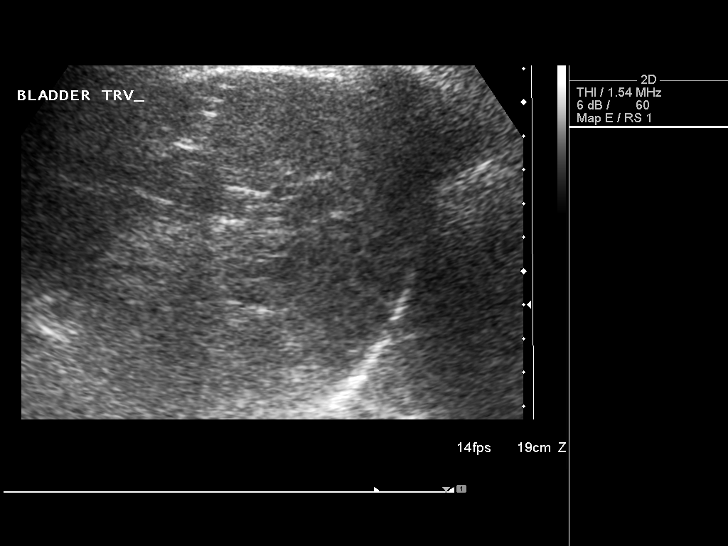

[14 of 25 positions shown; findings below may reference images not displayed]

FINDINGS: Gallbladder:  No gallstones, gallbladder wall thickening, or
pericholecystic fluid.

Common bile duct:  Normal in caliber measuring a maximum of 5.1mm.

Liver:  There is diffuse increased echogenicity of the liver and
decreased through transmission consistent with fatty infiltration.
No focal lesions or biliary dilatation.

IVC:  Normal caliber.

Pancreas:  Limited visualization but no significant abnormality is
identified..

Spleen:  Normal size and echogenicity without focal lesions.

Right Kidney:  10.1 cm in length. Normal renal cortical thickness
and echogenicity without focal lesions or hydronephrosis.

Left Kidney:  10.5 cm in length. Normal renal cortical thickness
and echogenicity without focal lesions or hydronephrosis.

Abdominal aorta:  Normal caliber.
IMPRESSION: 1.  Normal sonographic appearance of the gallbladder and normal
caliber common bile duct.
2.  Diffuse fatty infiltration of the liver.
3.  Normal sonographic appearance of the spleen, pancreas and both
kidneys.

## 2012-12-11 ENCOUNTER — Emergency Department (HOSPITAL_COMMUNITY)
Admission: EM | Admit: 2012-12-11 | Discharge: 2012-12-11 | Disposition: A | Discharge status: Home / Self Care | Payer: Medicaid Other | Attending: Emergency Medicine | Admitting: Emergency Medicine

## 2012-12-11 ENCOUNTER — Encounter (HOSPITAL_COMMUNITY): Payer: Self-pay

## 2012-12-11 DIAGNOSIS — Z8719 Personal history of other diseases of the digestive system: Secondary | ICD-10-CM | POA: Insufficient documentation

## 2012-12-11 DIAGNOSIS — R599 Enlarged lymph nodes, unspecified: Secondary | ICD-10-CM | POA: Insufficient documentation

## 2012-12-11 DIAGNOSIS — J45909 Unspecified asthma, uncomplicated: Secondary | ICD-10-CM | POA: Insufficient documentation

## 2012-12-11 DIAGNOSIS — J069 Acute upper respiratory infection, unspecified: Secondary | ICD-10-CM | POA: Insufficient documentation

## 2012-12-11 DIAGNOSIS — Z79899 Other long term (current) drug therapy: Secondary | ICD-10-CM | POA: Insufficient documentation

## 2012-12-11 DIAGNOSIS — Z8639 Personal history of other endocrine, nutritional and metabolic disease: Secondary | ICD-10-CM | POA: Insufficient documentation

## 2012-12-11 DIAGNOSIS — Z8659 Personal history of other mental and behavioral disorders: Secondary | ICD-10-CM | POA: Insufficient documentation

## 2012-12-11 DIAGNOSIS — Z862 Personal history of diseases of the blood and blood-forming organs and certain disorders involving the immune mechanism: Secondary | ICD-10-CM | POA: Insufficient documentation

## 2012-12-11 DIAGNOSIS — R6889 Other general symptoms and signs: Secondary | ICD-10-CM | POA: Insufficient documentation

## 2012-12-11 DIAGNOSIS — Z8679 Personal history of other diseases of the circulatory system: Secondary | ICD-10-CM | POA: Insufficient documentation

## 2012-12-11 DIAGNOSIS — J3489 Other specified disorders of nose and nasal sinuses: Secondary | ICD-10-CM | POA: Insufficient documentation

## 2012-12-11 DIAGNOSIS — G44209 Tension-type headache, unspecified, not intractable: Secondary | ICD-10-CM | POA: Insufficient documentation

## 2012-12-11 DIAGNOSIS — M542 Cervicalgia: Secondary | ICD-10-CM | POA: Insufficient documentation

## 2012-12-11 DIAGNOSIS — J029 Acute pharyngitis, unspecified: Secondary | ICD-10-CM | POA: Insufficient documentation

## 2012-12-11 MED ORDER — AZITHROMYCIN 250 MG PO TABS: 250.0000 mg | ORAL_TABLET | Freq: Every day | ORAL | Status: DC

## 2012-12-11 NOTE — ED Provider Notes (Signed)
Medical screening examination/treatment/procedure(s) were performed by non-physician practitioner and as supervising physician I was immediately available for consultation/collaboration. Devoria Albe, MD, FACEP   Ward Givens, MD 12/11/12 [phone]

## 2012-12-11 NOTE — ED Notes (Signed)
Pt states she has been coughing, sneezing, and having headaches since last Tuesday. Also reports sore throat. Denies any nausea, vomiting, or diarrhea.

## 2012-12-11 NOTE — ED Provider Notes (Signed)
History     CSN: [id number]  Arrival date & time 12/11/12  0740   First MD Initiated Contact with Patient 12/11/12 [phone]      Chief Complaint  Patient presents with  . Cough    (Consider location/radiation/quality/duration/timing/severity/associated sxs/prior treatment) Patient is a 42 y.o. female presenting with cough. The history is provided by the patient.  Cough Cough characteristics:  Productive Severity:  Moderate Onset quality:  Gradual Progression:  Unchanged Associated symptoms: headaches and sore throat   Associated symptoms: no chest pain, no rash and no shortness of breath   Associated symptoms comment:  She has had chills, cough, sweating at night, and headaches for one week. She has not taken her temperature. No N, V. Her headache starts in the neck bilaterally and moves forward.    Past Medical History  Diagnosis Date  . Anxiety   . Asthma   . Thyroid disease   . Hx of migraines   . Hyperlipidemia   . GERD (gastroesophageal reflux disease)     Past Surgical History  Procedure Laterality Date  . Cesarean section    . Btl  07/30/2008  . Wisdom tooth extraction    . Tubal ligation    . Hysteroscopy w/ endometrial ablation      Family History  Problem Relation Age of Onset  . Diabetes Father   . Hypertension Father   . Hyperlipidemia Father   . Thyroid disease Father   . Asthma Father   . Cancer Mother   . Hypertension Mother   . Thyroid disease Mother   . Cancer Sister     CERVICAL  . Thyroid disease Sister   . Asthma Sister   . Asthma Brother     History  Substance Use Topics  . Smoking status: Never Smoker   . Smokeless tobacco: Never Used  . Alcohol Use: Yes     Comment: occasionally liquour or beer    OB History   Grav Para Term Preterm Abortions TAB SAB Ect Mult Living   1 1        1       Review of Systems  HENT: Positive for congestion, sore throat and neck pain. Negative for trouble swallowing.        See HPI.   Respiratory: Positive for cough. Negative for shortness of breath.   Cardiovascular: Negative for chest pain.  Gastrointestinal: Negative for nausea, vomiting and abdominal pain.  Genitourinary: Negative for dysuria.  Musculoskeletal: Negative for back pain.  Skin: Negative for rash.  Neurological: Positive for headaches. Negative for weakness.  Psychiatric/Behavioral: Negative for confusion.    Allergies  Shellfish allergy; Peanut-containing drug products; Percocet; and Sulfa antibiotics  Home Medications   Current Outpatient Rx  Name  Route  Sig  Dispense  Refill  . Chlorphen-Phenyleph-ASA (ALKA-SELTZER PLUS COLD PO)   Oral   Take 1 packet by mouth 3 (three) times daily as needed (for cough/cold symptoms).         Marland Kitchen ibuprofen (ADVIL,MOTRIN) 600 MG tablet   Oral   Take 600 mg by mouth every 6 (six) hours as needed. For pain         . phentermine (ADIPEX-P) 37.5 MG tablet   Oral   Take 37.5 mg by mouth daily before breakfast.           BP 138/66  Pulse 92  Temp(Src) 98.3 F (36.8 C) (Oral)  Resp 18  SpO2 99%  Physical Exam  Constitutional: She is oriented to  person, place, and time. She appears well-developed and well-nourished. No distress.  HENT:  Nose: Mucosal edema present.  Mouth/Throat: Oropharynx is clear and moist.  Oropharynx is mildly erythematous  Neck: Normal range of motion. Neck supple.  FROM, no meningeal signs.  Cardiovascular: Normal rate and regular rhythm.   No murmur heard. Pulmonary/Chest: Effort normal. She has no wheezes. She has no rales.  Abdominal: Soft. There is no tenderness. There is no rebound and no guarding.  Musculoskeletal: Normal range of motion.  Tenderness to paracervical muscles that extends to scalp  Lymphadenopathy:    She has cervical adenopathy.  Neurological: She is alert and oriented to person, place, and time.  Skin: Skin is warm and dry. No rash noted.  Psychiatric: She has a normal mood and affect.    ED  Course  Procedures (including critical care time)  Labs Reviewed - No data to display No results found.   No diagnosis found.  1. URI 2. Tension type headache  MDM  Suspect URI, will treat based on duration of symptoms. Headache follows pattern of tension type. Will encourage other supportive measures.        Arnoldo Hooker, PA-C 12/11/12 [phone]

## 2013-01-08 ENCOUNTER — Other Ambulatory Visit: Payer: Self-pay | Admitting: Obstetrics and Gynecology

## 2013-01-08 DIAGNOSIS — R928 Other abnormal and inconclusive findings on diagnostic imaging of breast: Secondary | ICD-10-CM

## 2013-01-17 ENCOUNTER — Ambulatory Visit
Admission: RE | Admit: 2013-01-17 | Discharge: 2013-01-17 | Disposition: A | Discharge status: Home / Self Care | Payer: Medicaid Other | Source: Ambulatory Visit | Attending: Obstetrics and Gynecology | Admitting: Obstetrics and Gynecology

## 2013-01-17 ENCOUNTER — Other Ambulatory Visit: Payer: Self-pay | Admitting: Obstetrics and Gynecology

## 2013-01-17 DIAGNOSIS — R928 Other abnormal and inconclusive findings on diagnostic imaging of breast: Secondary | ICD-10-CM

## 2013-04-10 ENCOUNTER — Other Ambulatory Visit: Payer: Self-pay

## 2013-04-10 DIAGNOSIS — Z1231 Encounter for screening mammogram for malignant neoplasm of breast: Secondary | ICD-10-CM

## 2013-06-19 ENCOUNTER — Ambulatory Visit
Admission: RE | Admit: 2013-06-19 | Discharge: 2013-06-19 | Disposition: A | Discharge status: Home / Self Care | Payer: Medicaid Other | Source: Ambulatory Visit

## 2013-06-19 DIAGNOSIS — Z1231 Encounter for screening mammogram for malignant neoplasm of breast: Secondary | ICD-10-CM

## 2014-05-23 ENCOUNTER — Other Ambulatory Visit: Payer: Self-pay

## 2014-05-23 DIAGNOSIS — Z1231 Encounter for screening mammogram for malignant neoplasm of breast: Secondary | ICD-10-CM

## 2014-06-20 ENCOUNTER — Ambulatory Visit
Admission: RE | Admit: 2014-06-20 | Discharge: 2014-06-20 | Disposition: A | Discharge status: Home / Self Care | Payer: Medicaid Other | Source: Ambulatory Visit

## 2014-06-20 ENCOUNTER — Encounter (INDEPENDENT_AMBULATORY_CARE_PROVIDER_SITE_OTHER): Payer: Self-pay

## 2014-06-20 DIAGNOSIS — Z1231 Encounter for screening mammogram for malignant neoplasm of breast: Secondary | ICD-10-CM

## 2014-07-08 ENCOUNTER — Other Ambulatory Visit (HOSPITAL_COMMUNITY): Payer: Self-pay | Admitting: Internal Medicine

## 2014-07-08 ENCOUNTER — Ambulatory Visit (HOSPITAL_COMMUNITY)
Admission: RE | Admit: 2014-07-08 | Discharge: 2014-07-08 | Disposition: A | Discharge status: Home / Self Care | Payer: Medicaid Other | Source: Ambulatory Visit | Attending: Internal Medicine | Admitting: Internal Medicine

## 2014-07-08 DIAGNOSIS — M25519 Pain in unspecified shoulder: Secondary | ICD-10-CM | POA: Insufficient documentation

## 2014-07-08 DIAGNOSIS — M19019 Primary osteoarthritis, unspecified shoulder: Secondary | ICD-10-CM | POA: Insufficient documentation

## 2014-07-08 DIAGNOSIS — M25512 Pain in left shoulder: Secondary | ICD-10-CM

## 2014-07-08 IMAGING — CR DG SHOULDER 2+V*L*
3 series · 3 of 3 positions shown · non-contrast
Comparison: None.

CLINICAL DATA: Left shoulder pain after fall.

EXAM:
LEFT SHOULDER - 2+ VIEW

[w shoulder ap internal left]
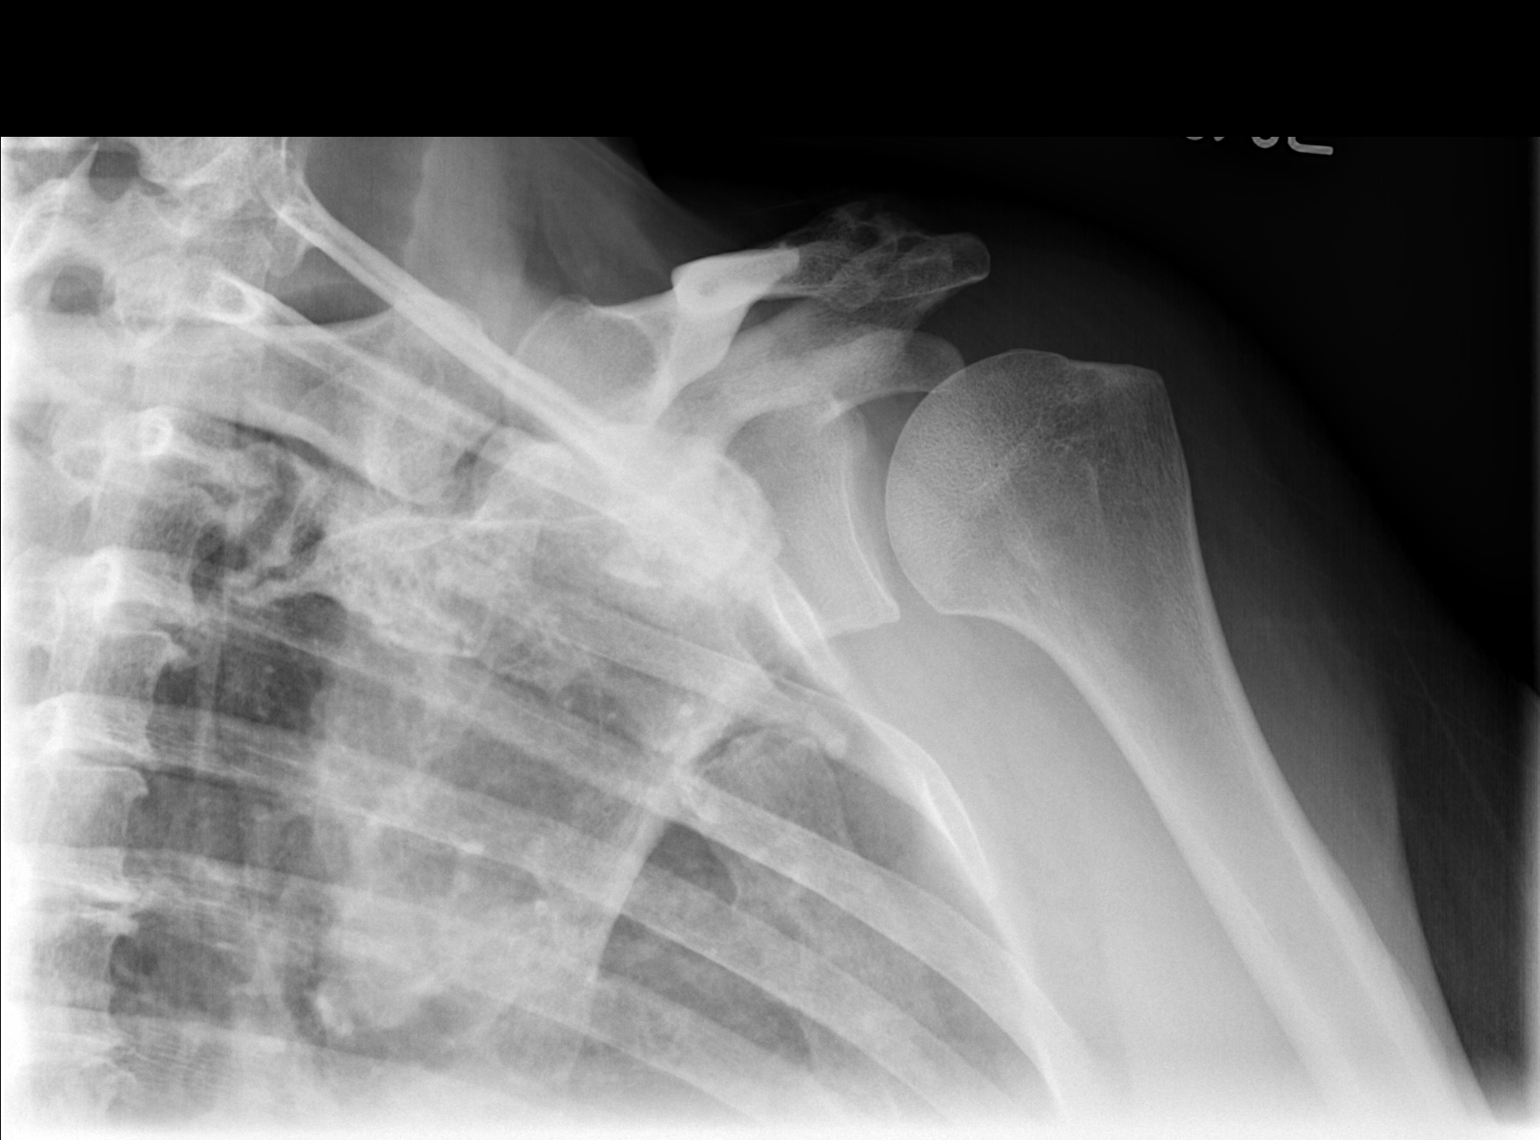

[w shoulder y view left]
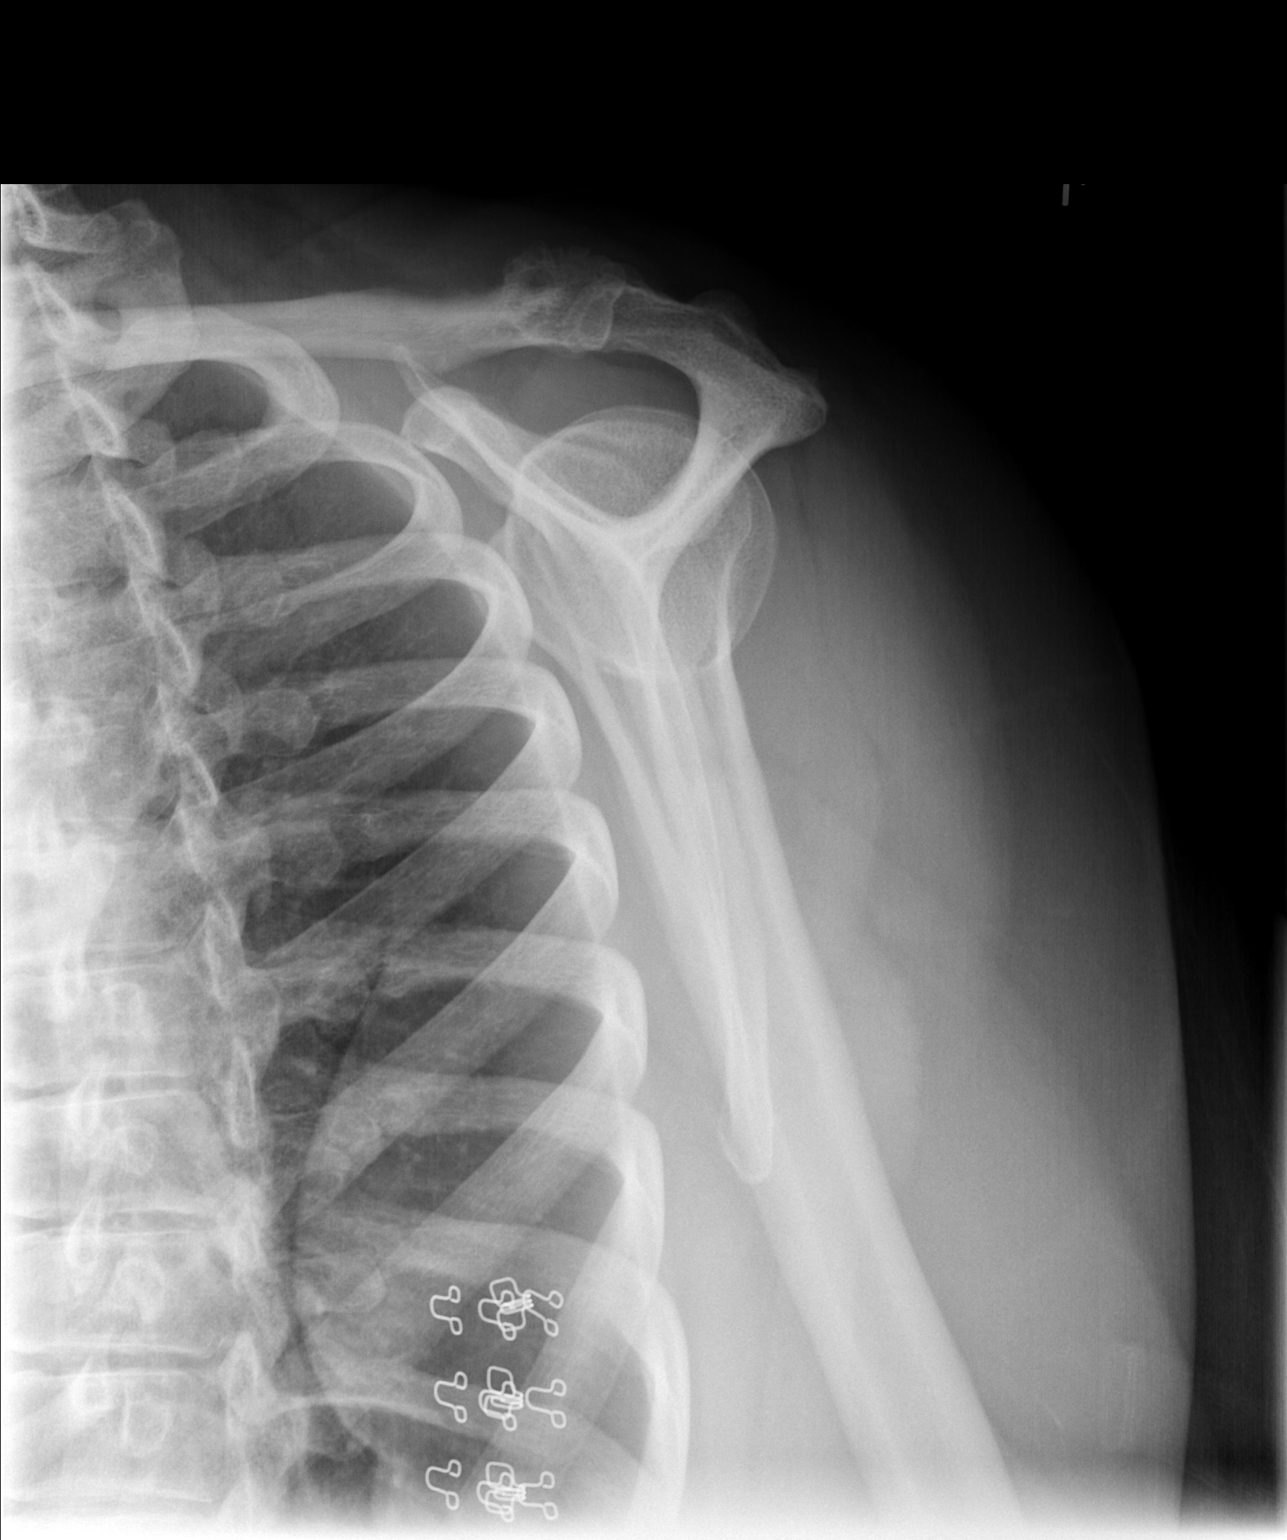

[x shoulder axillary left]
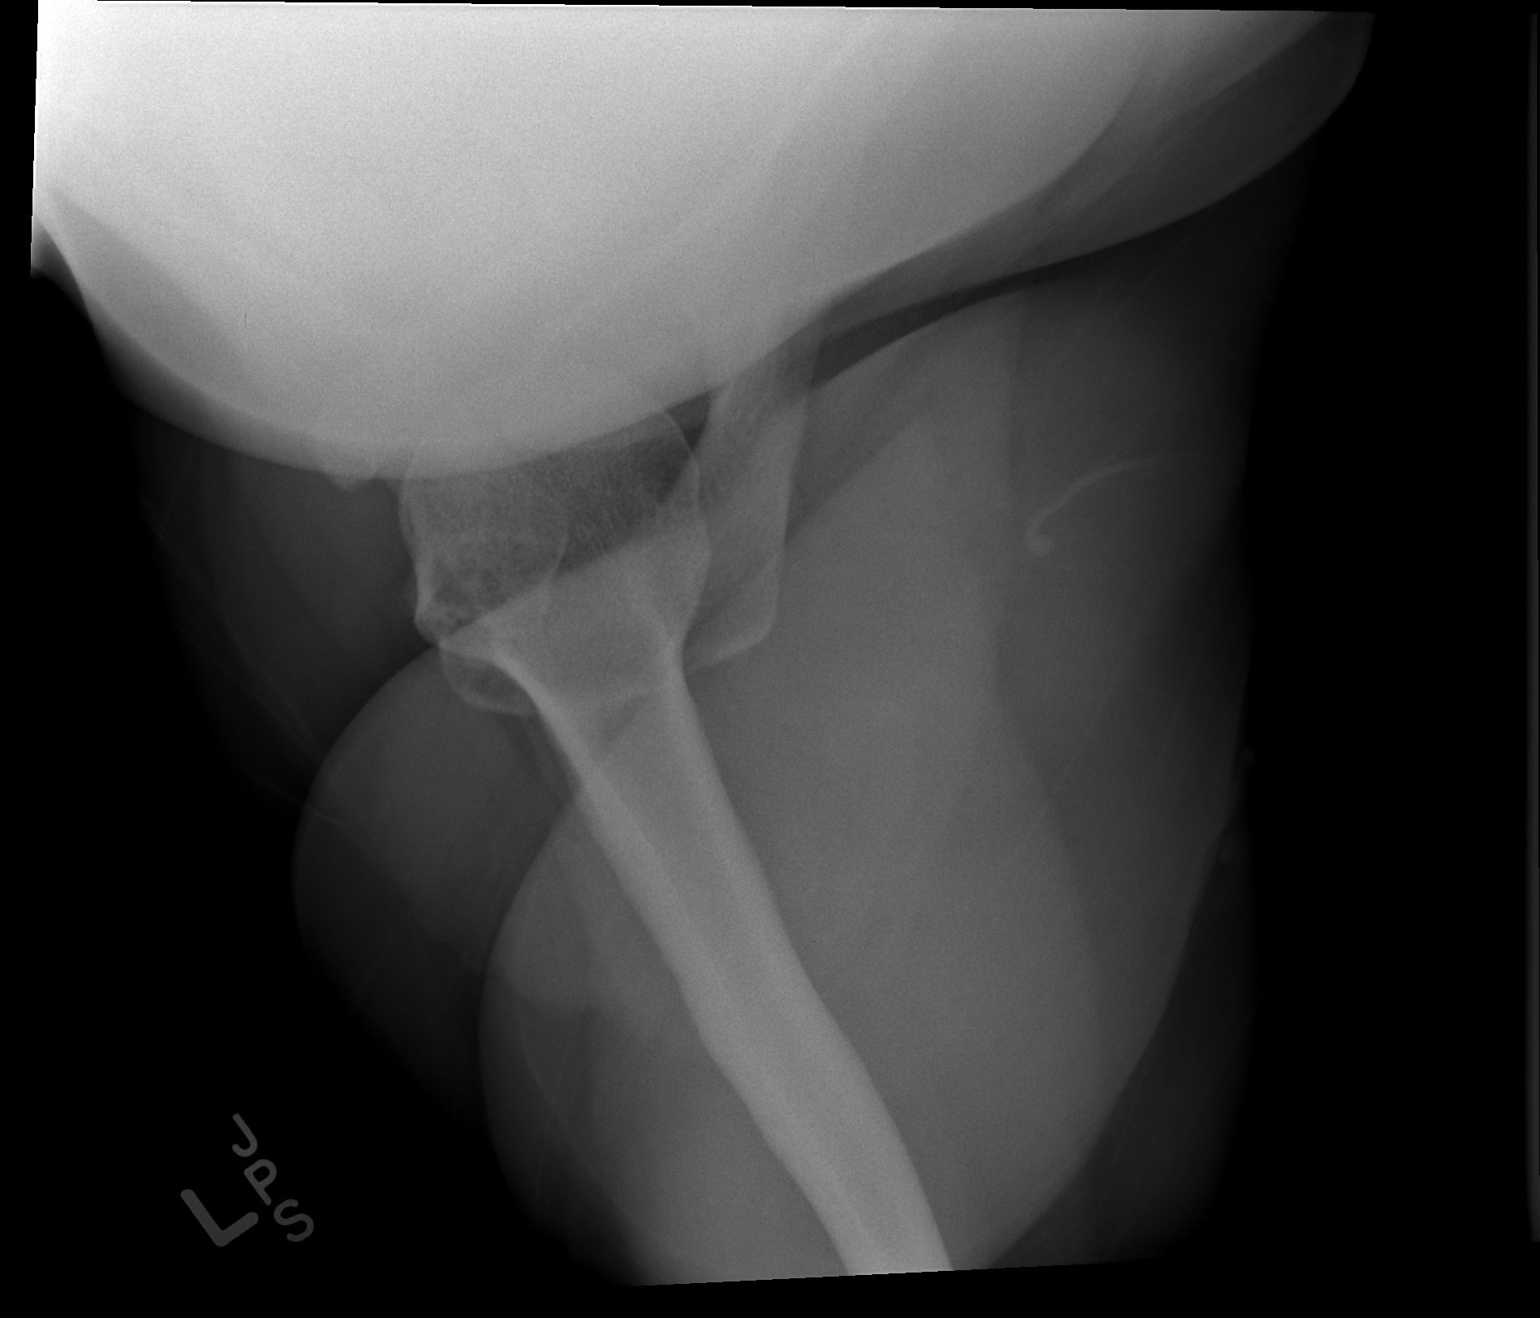

[3 of 3 positions shown; findings below may reference images not displayed]

FINDINGS: There is no evidence of fracture or dislocation. Degenerative change
of the left acromioclavicular joint is noted. Visualized ribs appear
normal. Soft tissues are unremarkable.
IMPRESSION: Degenerative joint disease of the left acromioclavicular joint. No
fracture or dislocation is noted.

## 2014-08-19 ENCOUNTER — Encounter (HOSPITAL_COMMUNITY): Payer: Self-pay

## 2014-09-19 ENCOUNTER — Ambulatory Visit (HOSPITAL_COMMUNITY)
Admission: RE | Admit: 2014-09-19 | Discharge: 2014-09-19 | Disposition: A | Discharge status: Home / Self Care | Payer: Medicaid Other | Source: Ambulatory Visit | Attending: Internal Medicine | Admitting: Internal Medicine

## 2014-09-19 ENCOUNTER — Other Ambulatory Visit (HOSPITAL_COMMUNITY): Payer: Self-pay | Admitting: Internal Medicine

## 2014-09-19 DIAGNOSIS — M25572 Pain in left ankle and joints of left foot: Secondary | ICD-10-CM

## 2014-09-19 DIAGNOSIS — W228XXA Striking against or struck by other objects, initial encounter: Secondary | ICD-10-CM | POA: Insufficient documentation

## 2014-09-19 IMAGING — CR DG ANKLE COMPLETE 3+V*L*
3 series · 3 of 3 positions shown · non-contrast
Comparison: None.

CLINICAL DATA: Hip with heavy object 1 month ago. Pain. Initial
evaluation.

EXAM:
LEFT ANKLE COMPLETE - 3+ VIEW

[ankle ap]
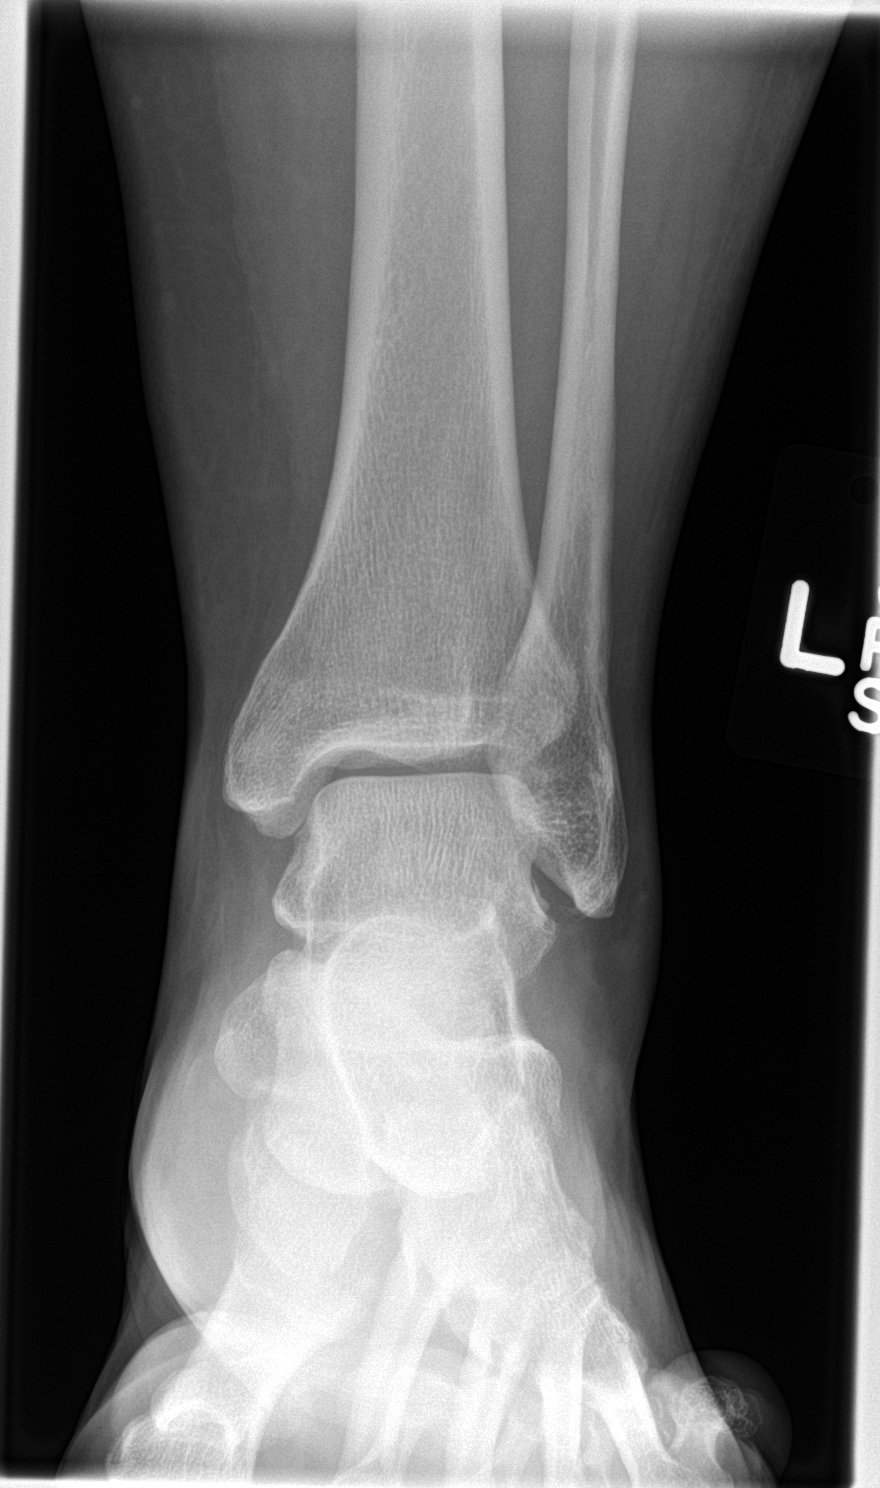

[ankle obl]
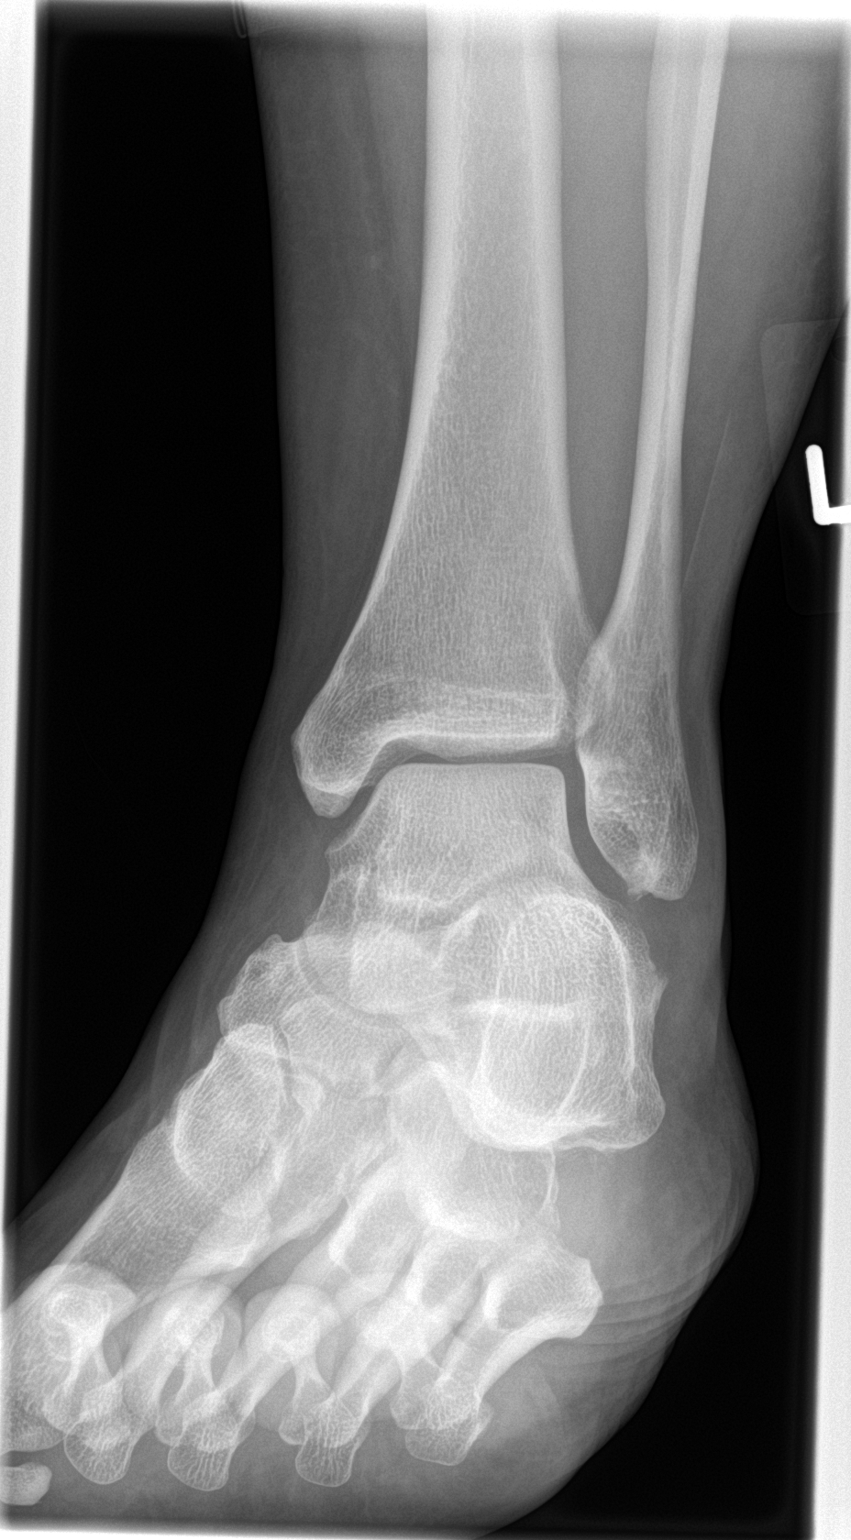

[ankle lat]
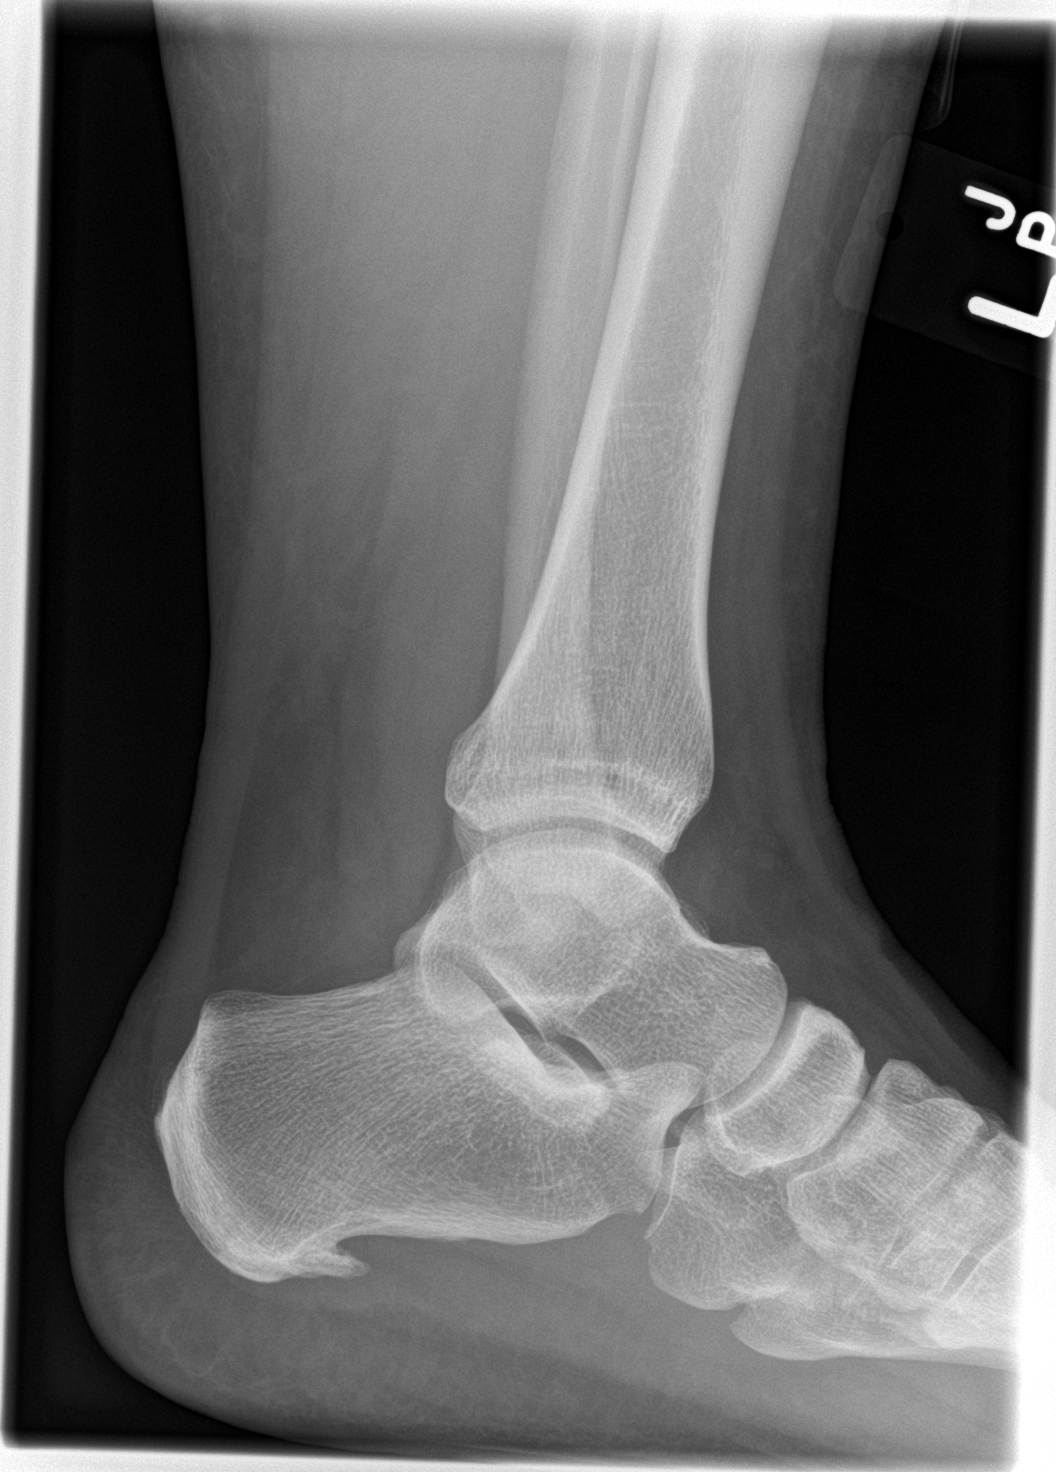

[3 of 3 positions shown; findings below may reference images not displayed]

FINDINGS: There is no evidence of fracture, dislocation, or joint effusion.
There is no evidence of arthropathy or other focal bone abnormality.
Soft tissues are unremarkable.
IMPRESSION: Negative.

## 2014-09-27 ENCOUNTER — Emergency Department (HOSPITAL_COMMUNITY)
Admission: EM | Admit: 2014-09-27 | Discharge: 2014-09-27 | Disposition: A | Discharge status: Home / Self Care | Payer: Medicaid Other | Attending: Emergency Medicine | Admitting: Emergency Medicine

## 2014-09-27 ENCOUNTER — Emergency Department (HOSPITAL_COMMUNITY): Payer: Medicaid Other

## 2014-09-27 ENCOUNTER — Encounter (HOSPITAL_COMMUNITY): Payer: Self-pay | Admitting: Emergency Medicine

## 2014-09-27 DIAGNOSIS — Z8719 Personal history of other diseases of the digestive system: Secondary | ICD-10-CM | POA: Diagnosis not present

## 2014-09-27 DIAGNOSIS — R52 Pain, unspecified: Secondary | ICD-10-CM

## 2014-09-27 DIAGNOSIS — Y998 Other external cause status: Secondary | ICD-10-CM | POA: Diagnosis not present

## 2014-09-27 DIAGNOSIS — J45909 Unspecified asthma, uncomplicated: Secondary | ICD-10-CM | POA: Diagnosis not present

## 2014-09-27 DIAGNOSIS — Y9289 Other specified places as the place of occurrence of the external cause: Secondary | ICD-10-CM | POA: Diagnosis not present

## 2014-09-27 DIAGNOSIS — W208XXA Other cause of strike by thrown, projected or falling object, initial encounter: Secondary | ICD-10-CM | POA: Diagnosis not present

## 2014-09-27 DIAGNOSIS — Y9389 Activity, other specified: Secondary | ICD-10-CM | POA: Diagnosis not present

## 2014-09-27 DIAGNOSIS — Z8679 Personal history of other diseases of the circulatory system: Secondary | ICD-10-CM | POA: Diagnosis not present

## 2014-09-27 DIAGNOSIS — Z79899 Other long term (current) drug therapy: Secondary | ICD-10-CM | POA: Insufficient documentation

## 2014-09-27 DIAGNOSIS — S5011XA Contusion of right forearm, initial encounter: Secondary | ICD-10-CM | POA: Insufficient documentation

## 2014-09-27 DIAGNOSIS — Z8659 Personal history of other mental and behavioral disorders: Secondary | ICD-10-CM | POA: Insufficient documentation

## 2014-09-27 DIAGNOSIS — Z8639 Personal history of other endocrine, nutritional and metabolic disease: Secondary | ICD-10-CM | POA: Insufficient documentation

## 2014-09-27 DIAGNOSIS — S59911A Unspecified injury of right forearm, initial encounter: Secondary | ICD-10-CM | POA: Diagnosis present

## 2014-09-27 HISTORY — DX: Hepatomegaly, not elsewhere classified: R16.0

## 2014-09-27 IMAGING — CR DG FOREARM 2V*R*
2 series · 2 of 2 positions shown · non-contrast
Comparison: None.

CLINICAL DATA: Injured right forearm.

EXAM:
RIGHT FOREARM - 2 VIEW

[forearm ap]
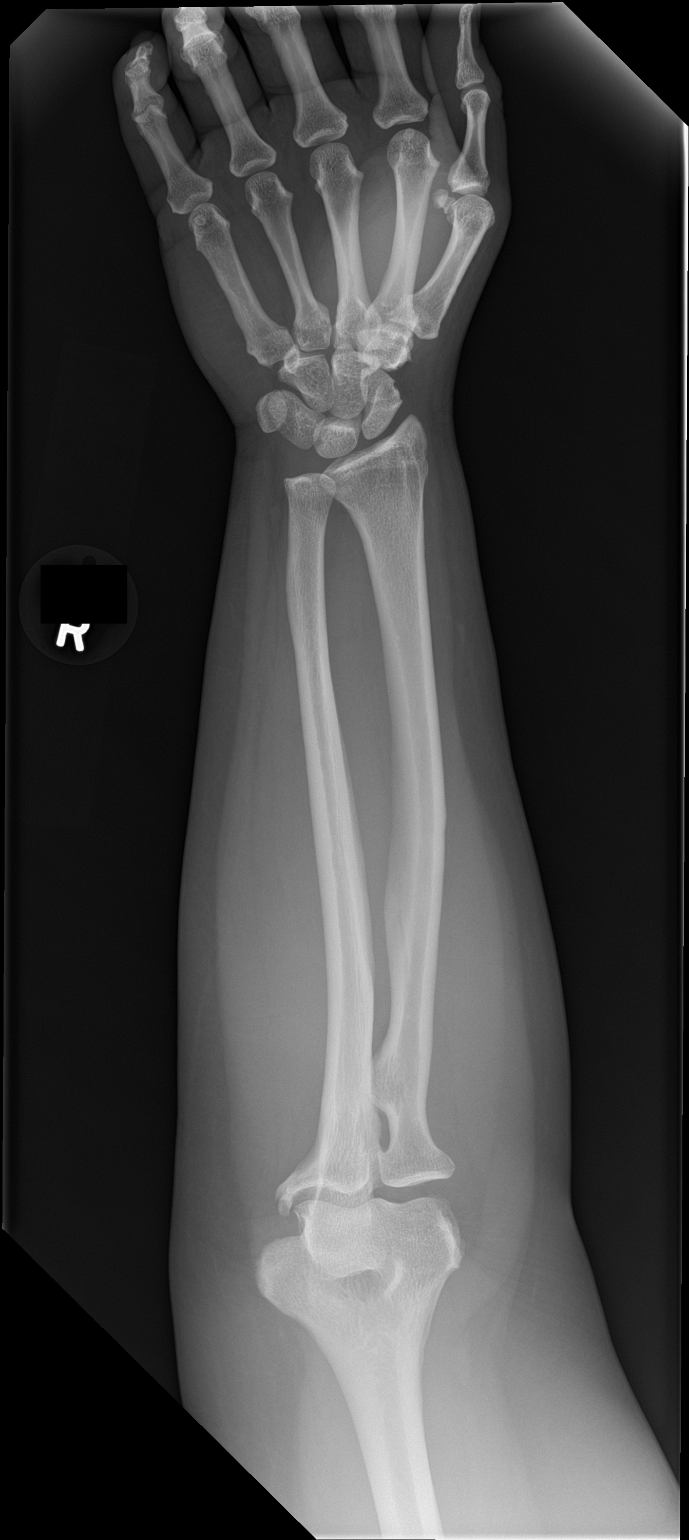

[forearm lat]
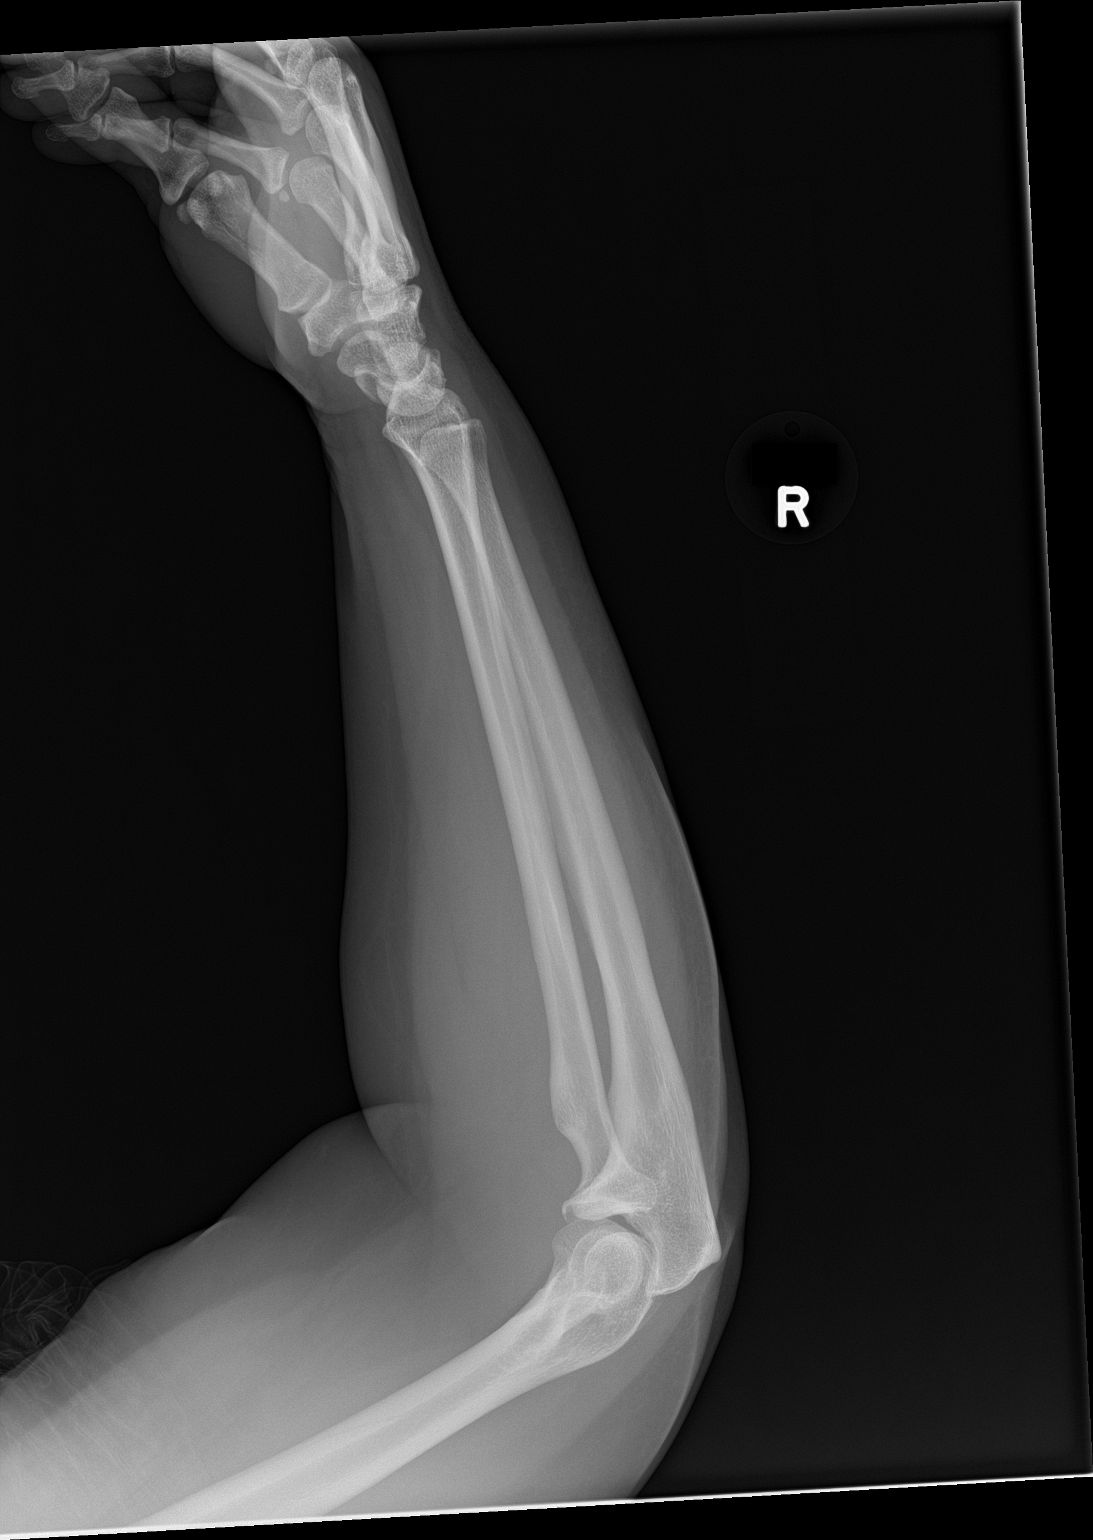

[2 of 2 positions shown; findings below may reference images not displayed]

FINDINGS: The wrist and elbow joints are maintained. Degenerative changes are
noted at the elbow. No elbow joint effusion. No acute forearm
fracture.
IMPRESSION: No acute bony findings.

## 2014-09-27 MED ORDER — IBUPROFEN 800 MG PO TABS: 800.0000 mg | ORAL_TABLET | Freq: Three times a day (TID) | ORAL | Status: DC | PRN

## 2014-09-27 NOTE — Discharge Instructions (Signed)
Read the information below.  Use the prescribed medication as directed.  Please discuss all new medications with your pharmacist.  You may return to the Emergency Department at any time for worsening condition or any new symptoms that concern you.  If there is any possibility that you might be pregnant, please let your health care provider know and discuss this with the pharmacist to ensure medication safety.  If you develop uncontrolled pain, weakness or numbness of the extremity, severe discoloration of the skin, or you are unable to move your hand, return to the ER for a recheck.     Contusion A contusion is the result of an injury to the skin and underlying tissues and is usually caused by direct trauma. The injury results in the appearance of a bruise on the skin overlying the injured tissues. Contusions cause rupture and bleeding of the small capillaries and blood vessels and affect function, because the bleeding infiltrates muscles, tendons, nerves, or other soft tissues.  SYMPTOMS   Swelling and often a hard lump in the injured area, either superficial or deep.  Pain and tenderness over the area of the contusion.  Feeling of firmness when pressure is exerted over the contusion.  Discoloration under the skin, beginning with redness and progressing to the characteristic "black and blue" bruise. CAUSES  A contusion is typically the result of direct trauma. This is often by a blunt object.  RISK INCREASES WITH:  Sports that have a high likelihood of trauma (football, boxing, ice hockey, soccer, field hockey, martial arts, basketball, and baseball).  Sports that make falling from a height likely (high-jumping, pole-vaulting, skating, or gymnastics).  Any bleeding disorder (hemophilia) or taking medications that affect clotting (aspirin, nonsteroidal anti-inflammatory medications, or warfarin [Coumadin]).  Inadequate protection of exposed areas during contact sports. PREVENTION  Maintain  physical fitness:  Joint and muscle flexibility.  Strength and endurance.  Coordination.  Wear proper protective equipment. Make sure it fits correctly. PROGNOSIS  Contusions typically heal without any complications. Healing time varies with the severity of injury and intake of medications that affect clotting. Contusions usually heal in 1 to 4 weeks. RELATED COMPLICATIONS   Damage to nearby nerves or blood vessels, causing numbness, coldness, or paleness.  Compartment syndrome.  Bleeding into the soft tissues that leads to disability.  Infiltrative-type bleeding, leading to the calcification and impaired function of the injured muscle (rare).  Prolonged healing time if usual activities are resumed too soon.  Infection if the skin over the injury site is broken.  Fracture of the bone underlying the contusion.  Stiffness in the joint where the injured muscle crosses. TREATMENT  Treatment initially consists of resting the injured area as well as medication and ice to reduce inflammation. The use of a compression bandage may also be helpful in minimizing inflammation. As pain diminishes and movement is tolerated, the joint where the affected muscle crosses should be moved to prevent stiffness and the shortening (contracture) of the joint. Movement of the joint should begin as soon as possible. It is also important to work on maintaining strength within the affected muscles. Occasionally, extra padding over the area of contusion may be recommended before returning to sports, particularly if re-injury is likely.  MEDICATION   If pain relief is necessary these medications are often recommended:  Nonsteroidal anti-inflammatory medications, such as aspirin and ibuprofen.  Other minor pain relievers, such as acetaminophen, are often recommended.  Prescription pain relievers may be given by your caregiver. Use only as  directed and only as much as you need. HEAT AND COLD  Cold  treatment (icing) relieves pain and reduces inflammation. Cold treatment should be applied for 10 to 15 minutes every 2 to 3 hours for inflammation and pain and immediately after any activity that aggravates your symptoms. Use ice packs or an ice massage. (To do an ice massage fill a large styrofoam cup with water and freeze. Tear a small amount of foam from the top so ice protrudes. Massage ice firmly over the injured area in a circle about the size of a softball.)  Heat treatment may be used prior to performing the stretching and strengthening activities prescribed by your caregiver, physical therapist, or athletic trainer. Use a heat pack or a warm soak. SEEK MEDICAL CARE IF:   Symptoms get worse or do not improve despite treatment in a few days.  You have difficulty moving a joint.  Any extremity becomes extremely painful, numb, pale, or cool (This is an emergency!).  Medication produces any side effects (bleeding, upset stomach, or allergic reaction).  Signs of infection (drainage from skin, headache, muscle aches, dizziness, fever, or general ill feeling) occur if skin was broken. Document Released: 10/04/2005 Document Revised: 12/27/2011 Document Reviewed: 01/16/2009 [facility] Patient Information 2015 [employer], [place]. This information is not intended to replace advice given to you by your health care provider. Make sure you discuss any questions you have with your health care provider.
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
## 2014-11-20 ENCOUNTER — Emergency Department (HOSPITAL_COMMUNITY)
Admission: EM | Admit: 2014-11-20 | Discharge: 2014-11-20 | Disposition: A | Discharge status: Home / Self Care | Payer: Medicaid Other | Attending: Emergency Medicine | Admitting: Emergency Medicine

## 2014-11-20 ENCOUNTER — Encounter (HOSPITAL_COMMUNITY): Payer: Self-pay | Admitting: Physical Medicine and Rehabilitation

## 2014-11-20 ENCOUNTER — Emergency Department (HOSPITAL_COMMUNITY): Payer: Medicaid Other

## 2014-11-20 DIAGNOSIS — Z8639 Personal history of other endocrine, nutritional and metabolic disease: Secondary | ICD-10-CM | POA: Diagnosis not present

## 2014-11-20 DIAGNOSIS — N390 Urinary tract infection, site not specified: Secondary | ICD-10-CM | POA: Diagnosis not present

## 2014-11-20 DIAGNOSIS — Z8659 Personal history of other mental and behavioral disorders: Secondary | ICD-10-CM | POA: Diagnosis not present

## 2014-11-20 DIAGNOSIS — R110 Nausea: Secondary | ICD-10-CM | POA: Insufficient documentation

## 2014-11-20 DIAGNOSIS — Z8719 Personal history of other diseases of the digestive system: Secondary | ICD-10-CM | POA: Diagnosis not present

## 2014-11-20 DIAGNOSIS — Z8679 Personal history of other diseases of the circulatory system: Secondary | ICD-10-CM | POA: Insufficient documentation

## 2014-11-20 DIAGNOSIS — R630 Anorexia: Secondary | ICD-10-CM | POA: Insufficient documentation

## 2014-11-20 DIAGNOSIS — J45909 Unspecified asthma, uncomplicated: Secondary | ICD-10-CM | POA: Diagnosis not present

## 2014-11-20 DIAGNOSIS — Z3202 Encounter for pregnancy test, result negative: Secondary | ICD-10-CM | POA: Diagnosis not present

## 2014-11-20 DIAGNOSIS — R1011 Right upper quadrant pain: Secondary | ICD-10-CM | POA: Diagnosis present

## 2014-11-20 DIAGNOSIS — Z792 Long term (current) use of antibiotics: Secondary | ICD-10-CM | POA: Insufficient documentation

## 2014-11-20 DIAGNOSIS — R109 Unspecified abdominal pain: Secondary | ICD-10-CM

## 2014-11-20 LAB — COMPREHENSIVE METABOLIC PANEL
ALBUMIN: 3.7 g/dL (ref 3.5–5.2)
ALK PHOS: 60 U/L (ref 39–117)
ALT: 15 U/L (ref 0–35)
AST: 20 U/L (ref 0–37)
Anion gap: 4 — ABNORMAL LOW (ref 5–15)
BUN: 11 mg/dL (ref 6–23)
CALCIUM: 8.8 mg/dL (ref 8.4–10.5)
CHLORIDE: 104 mmol/L (ref 96–112)
CO2: 31 mmol/L (ref 19–32)
CREATININE: 0.78 mg/dL (ref 0.50–1.10)
GFR calc Af Amer: >90 mL/min (ref >90)
Glucose, Bld: 119 mg/dL — ABNORMAL HIGH (ref 70–99)
POTASSIUM: 4 mmol/L (ref 3.5–5.1)
SODIUM: 139 mmol/L (ref 135–145)
TOTAL PROTEIN: 6.9 g/dL (ref 6.0–8.3)
Total Bilirubin: 0.6 mg/dL (ref 0.3–1.2)

## 2014-11-20 LAB — CBC WITH DIFFERENTIAL/PLATELET
BASOS PCT: 0 % (ref 0–1)
Basophils Absolute: 0 10*3/uL (ref 0.0–0.1)
Eosinophils Absolute: 0.1 10*3/uL (ref 0.0–0.7)
Eosinophils Relative: 1 % (ref 0–5)
HCT: 41.4 % (ref 36.0–46.0)
Hemoglobin: 14 g/dL (ref 12.0–15.0)
LYMPHS ABS: 1.5 10*3/uL (ref 0.7–4.0)
LYMPHS PCT: 31 % (ref 12–46)
MCH: 30.3 pg (ref 26.0–34.0)
MCHC: 33.8 g/dL (ref 30.0–36.0)
MCV: 89.6 fL (ref 78.0–100.0)
MONO ABS: 0.3 10*3/uL (ref 0.1–1.0)
MONOS PCT: 7 % (ref 3–12)
NEUTROS PCT: 61 % (ref 43–77)
Neutro Abs: 3 10*3/uL (ref 1.7–7.7)
PLATELETS: 254 10*3/uL (ref 150–400)
RBC: 4.62 MIL/uL (ref 3.87–5.11)
RDW: 13.2 % (ref 11.5–15.5)
WBC: 4.9 10*3/uL (ref 4.0–10.5)

## 2014-11-20 LAB — URINE MICROSCOPIC-ADD ON

## 2014-11-20 LAB — URINALYSIS, ROUTINE W REFLEX MICROSCOPIC
Bilirubin Urine: NEGATIVE
GLUCOSE, UA: NEGATIVE mg/dL
Hgb urine dipstick: NEGATIVE
KETONES UR: NEGATIVE mg/dL
NITRITE: NEGATIVE
PH: 6.5 (ref 5.0–8.0)
Protein, ur: NEGATIVE mg/dL
SPECIFIC GRAVITY, URINE: 1.018 (ref 1.005–1.030)
UROBILINOGEN UA: 1 mg/dL (ref 0.0–1.0)

## 2014-11-20 LAB — LIPASE, BLOOD: Lipase: 32 U/L (ref 11–59)

## 2014-11-20 LAB — POC URINE PREG, ED: Preg Test, Ur: NEGATIVE

## 2014-11-20 IMAGING — US US ABDOMEN COMPLETE
1 series · 14 of 25 positions shown · non-contrast
Comparison: Abdomen ultrasound [DATE]. CT Abdomen and Pelvis
[DATE].

CLINICAL DATA: 45-year-old female with right upper quadrant
abdominal pain. Right side pain with nausea for 1 day. Initial
encounter.

EXAM:
ULTRASOUND ABDOMEN COMPLETE

[Series 1: us abdomen complete · 0.22mm/px · 14 of 70 slices shown]
[im 1/70]
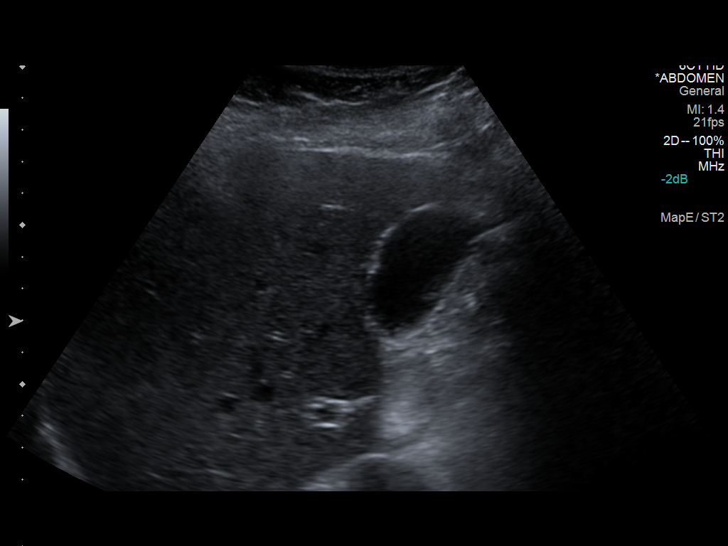
[im 6/70]
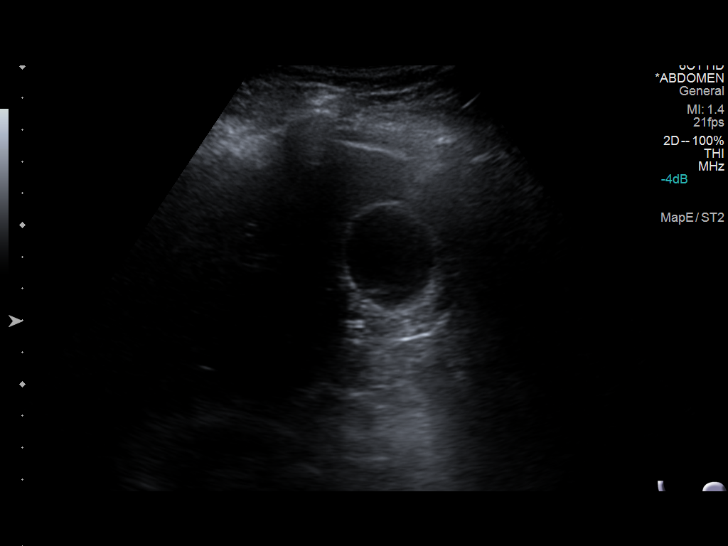
[im 12/70]
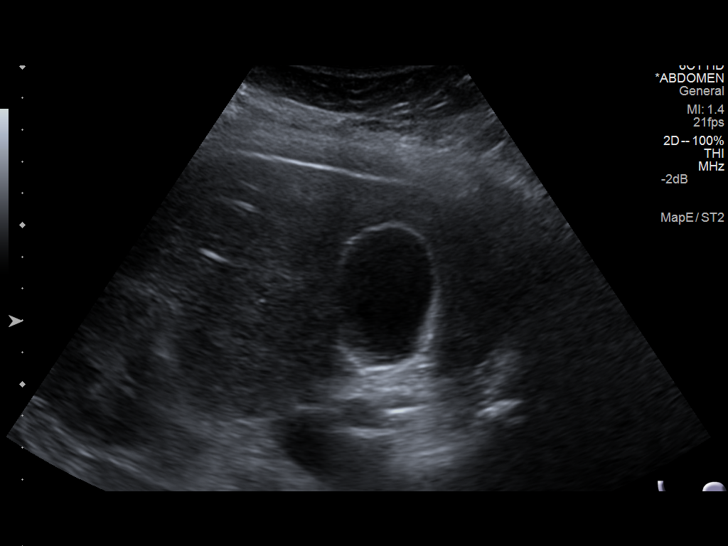
[im 18/70]
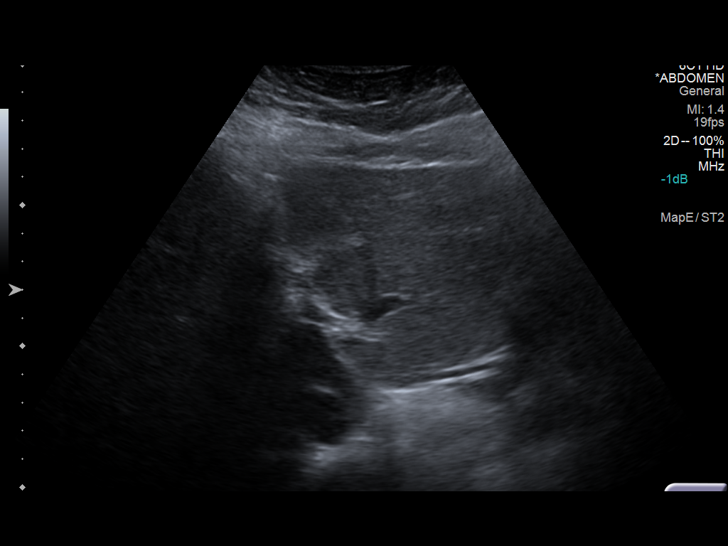
[im 24/70]
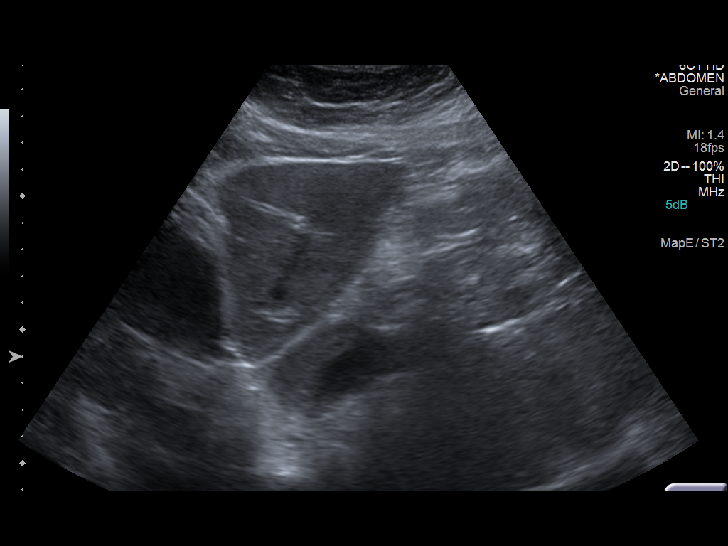
[im 26/70]
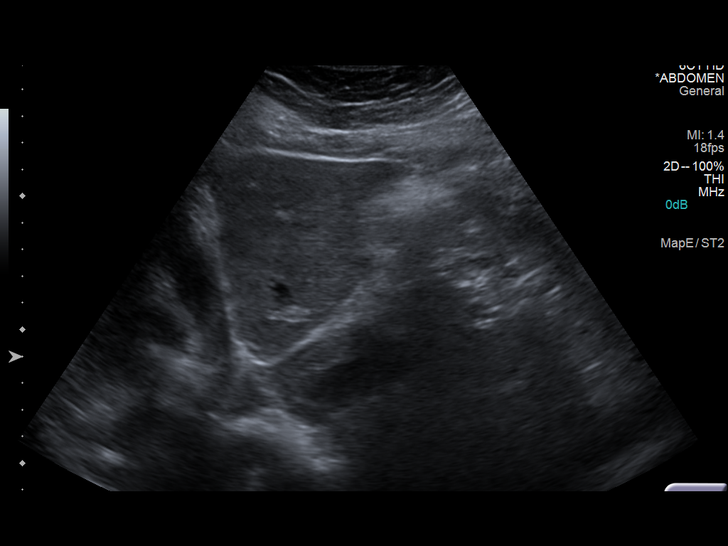
[im 32/70]
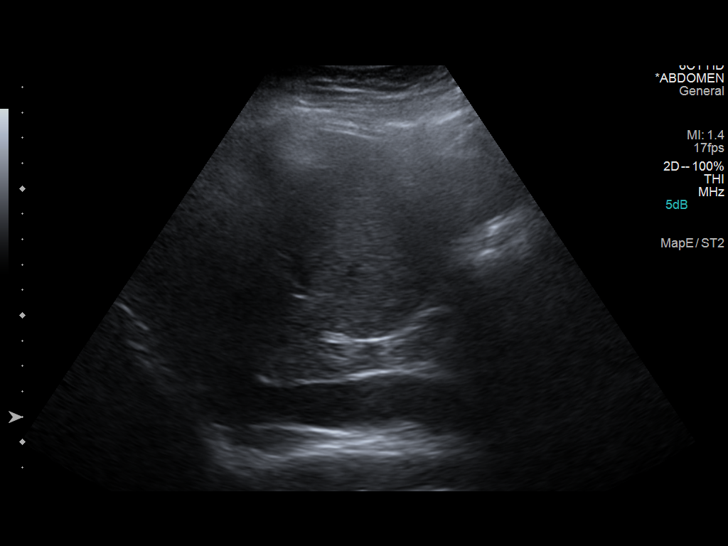
[im 38/70]
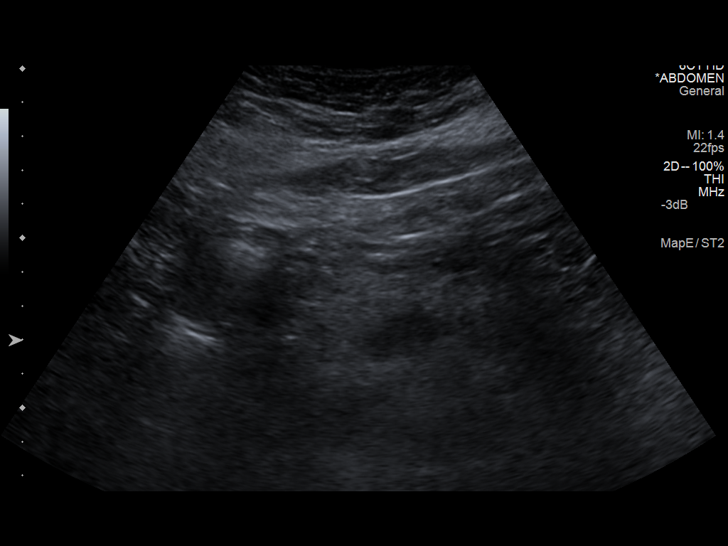
[im 44/70]
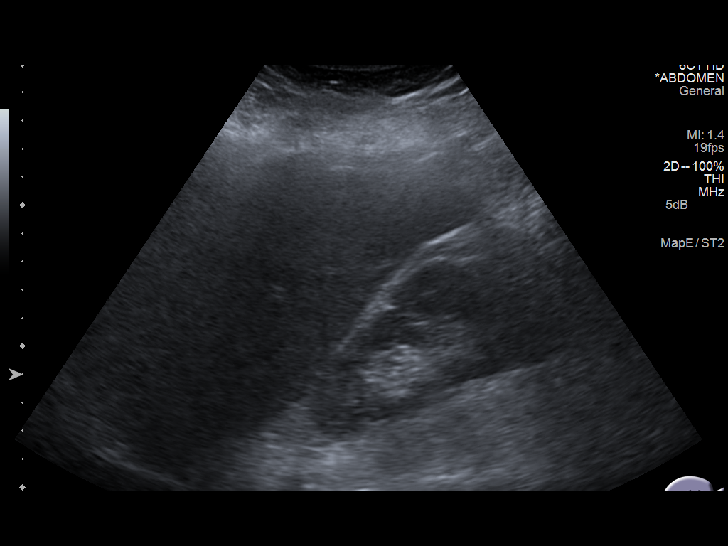
[im 47/70]
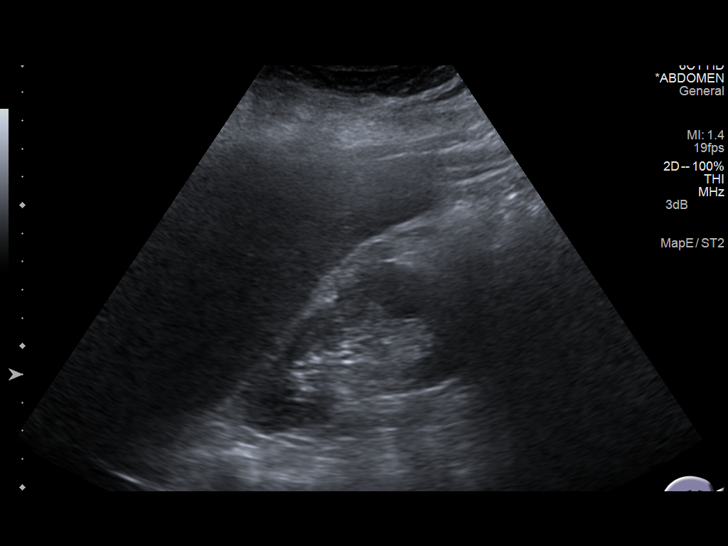
[im 52/70]
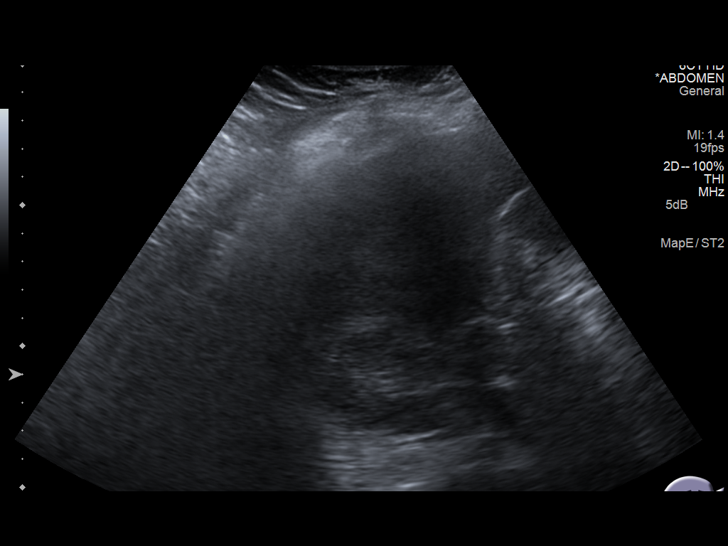
[im 58/70]
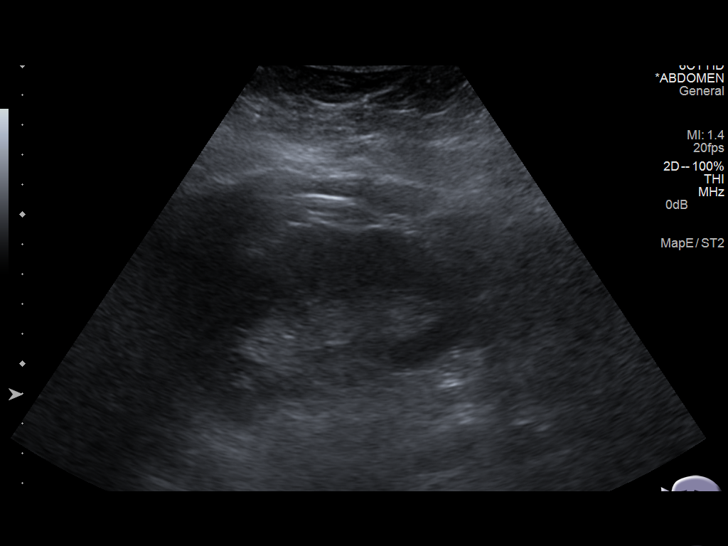
[im 64/70]
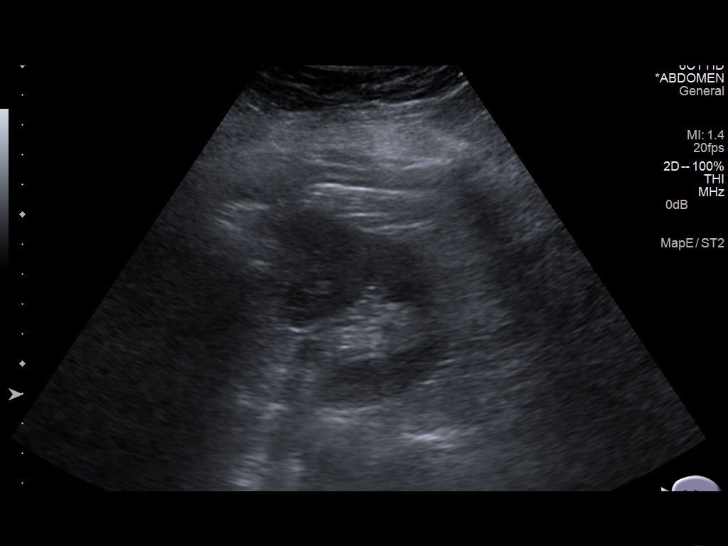
[im 70/70]
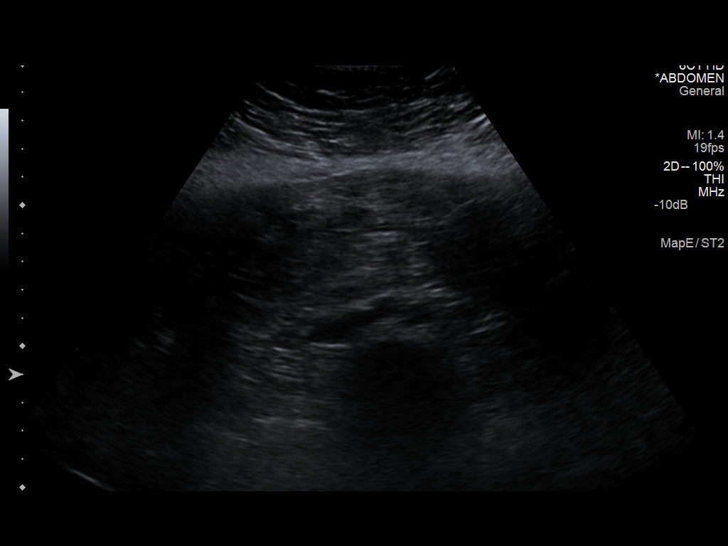

[14 of 25 positions shown; findings below may reference images not displayed]

FINDINGS: Gallbladder: No gallstones or wall thickening visualized. No
sonographic Murphy sign noted.

Common bile duct: Diameter: 5-6 mm, within normal limits

Liver: No focal lesion identified. Within normal limits in
parenchymal echogenicity.

IVC: No abnormality visualized.

Pancreas: Visualized portion unremarkable.

Spleen: Size and appearance within normal limits.

Right Kidney: Length: 10.1 cm. Echogenicity within normal limits. No
mass or hydronephrosis visualized.

Left Kidney: Length: 11.1 cm. Echogenicity within normal limits. No
mass or hydronephrosis visualized.

Abdominal aorta: Incompletely visualized due to overlying bowel gas,
visualized portions within normal limits. No aneurysm identified.

Other findings: None.
IMPRESSION: Negative gallbladder and abdomen ultrasound, no strong sonographic
evidence of hepatic steatosis today.

## 2014-11-20 MED ORDER — MORPHINE SULFATE 4 MG/ML IJ SOLN
6.0000 mg | Freq: Once | INTRAMUSCULAR | Status: AC
  Administered 2014-11-20: 6 mg via INTRAVENOUS
  Filled 2014-11-20: qty 2

## 2014-11-20 MED ORDER — TRAMADOL HCL 50 MG PO TABS: 50.0000 mg | ORAL_TABLET | Freq: Four times a day (QID) | ORAL | Status: DC | PRN

## 2014-11-20 MED ORDER — DEXTROSE 5 % IV SOLN
1.0000 g | Freq: Once | INTRAVENOUS | Status: AC
  Administered 2014-11-20: 1 g via INTRAVENOUS
  Filled 2014-11-20: qty 10

## 2014-11-20 MED ORDER — ONDANSETRON HCL 4 MG/2ML IJ SOLN
4.0000 mg | Freq: Once | INTRAMUSCULAR | Status: AC
  Administered 2014-11-20: 4 mg via INTRAVENOUS
  Filled 2014-11-20: qty 2

## 2014-11-20 MED ORDER — CEPHALEXIN 500 MG PO CAPS: 1000.0000 mg | ORAL_CAPSULE | Freq: Two times a day (BID) | ORAL | Status: DC

## 2014-11-20 NOTE — ED Provider Notes (Signed)
CSN: [id number]     Arrival date & time 11/20/14  0808 History   First MD Initiated Contact with Patient 11/20/14 [phone]     Chief Complaint  Patient presents with  . Flank Pain     (Consider location/radiation/quality/duration/timing/severity/associated sxs/prior Treatment) HPI   44 year old female with history of anxiety, GERD, hepatomegaly who presents for evaluation of flank pain. Patient reports since yesterday afternoon she has noticed a sharp pain to her right flank that wraps around her upper abdomen. Pain has been intermittent but progressively worsened, and now is it is 8 out of 10. She did try to take a muscle relaxant last night that makes her drowsy but did not alleviate her pain. She endorses nausea and decrease in appetite and haven't ate since. She endorsed chills. Denies fever, denies chest pain, short of breath, productive cough, lightheadedness, dizziness, dysuria, hematuria, hematochezia or melena. She no prior history of kidney stone. She has an intact gallbladder. No bowel pattern incontinence, and no hematuria.  Able to pass flatus  Past Medical History  Diagnosis Date  . Anxiety   . Asthma   . Thyroid disease   . Hx of migraines   . Hyperlipidemia   . GERD (gastroesophageal reflux disease)   . Hepatomegaly    Past Surgical History  Procedure Laterality Date  . Cesarean section    . Btl  07/30/2008  . Wisdom tooth extraction    . Tubal ligation    . Hysteroscopy w/ endometrial ablation     Family History  Problem Relation Age of Onset  . Diabetes Father   . Hypertension Father   . Hyperlipidemia Father   . Thyroid disease Father   . Asthma Father   . Cancer Mother   . Hypertension Mother   . Thyroid disease Mother   . Cancer Sister     CERVICAL  . Thyroid disease Sister   . Asthma Sister   . Asthma Brother    History  Substance Use Topics  . Smoking status: Never Smoker   . Smokeless tobacco: Never Used  . Alcohol Use: Yes     Comment: socially    OB History    Gravida Para Term Preterm AB TAB SAB Ectopic Multiple Living   Review of Systems  All other systems reviewed and are negative.     Allergies  Shellfish allergy; Peanut-containing drug products; Percocet; and Sulfa antibiotics  Home Medications   Prior to Admission medications   Medication Sig Start Date End Date Taking? Authorizing Provider  azithromycin (ZITHROMAX) 250 MG tablet Take 1 tablet (250 mg total) by mouth daily. Take first 2 tablets together, then 1 every day until finished. 12/11/12   Shari A Upstill, PA-C  Chlorphen-Phenyleph-ASA (ALKA-SELTZER PLUS COLD PO) Take 1 packet by mouth 3 (three) times daily as needed (for cough/cold symptoms).    Historical Provider, MD  ibuprofen (ADVIL,MOTRIN) 800 MG tablet Take 1 tablet (800 mg total) by mouth every 8 (eight) hours as needed for mild pain or moderate pain. 09/27/14   Trixie Dredge, PA-C  phentermine (ADIPEX-P) 37.5 MG tablet Take 37.5 mg by mouth daily before breakfast.    Historical Provider, MD   BP 125/70 mmHg  Pulse 94  Temp(Src) 98.2 F (36.8 C) (Oral)  Resp 18  SpO2 97% Physical Exam  Constitutional: She appears well-developed and well-nourished. No distress.  HENT:  Head: Atraumatic.  Eyes: Conjunctivae are normal.  Neck: Neck supple.  Cardiovascular: Normal rate and regular rhythm.   Pulmonary/Chest: Effort normal and breath sounds normal. She has no wheezes. She exhibits no tenderness.  Abdominal: Soft. There is tenderness (right upper quadrant abdominal tenderness with guarding, no rebound tenderness. Positive Murphy sign, no pain atMcBurney's point).  Genitourinary:  Right CVA tenderness to percussion.  Musculoskeletal: She exhibits no edema or tenderness.  Neurological: She is alert. She has normal reflexes.  Skin: No rash noted.  Psychiatric: She has a normal mood and affect.  Nursing note and vitals reviewed.   ED Course  Procedures (including critical care  time)  Patient here with complaint of right flank pain that wraps to her right upper quadrant abdomen. Pain is suggestive of biliary disease. Abdominal ultrasound ordered. Will check UA, as pt report increase urinary frequency and occasional urgency without burning on urination.    1:05 PM Abdominal ultrasound shows no evidence of biliary disease or acute finding. Labs are reassuring. UA with evidence of urinary tract infection. Patient likely having a kidney infection. Rocephin given here in the ER. Patient's vital signs stable, therefore we'll discharge with antibiotic. Patient to follow-up with PCP as needed, return precautions discussed.  Labs Review Labs Reviewed  COMPREHENSIVE METABOLIC PANEL - Abnormal; Notable for the following:    Glucose, Bld 119 (*)    Anion gap 4 (*)    All other components within normal limits  URINALYSIS, ROUTINE W REFLEX MICROSCOPIC - Abnormal; Notable for the following:    APPearance HAZY (*)    Leukocytes, UA MODERATE (*)    All other components within normal limits  URINE MICROSCOPIC-ADD ON - Abnormal; Notable for the following:    Squamous Epithelial / LPF MANY (*)    Bacteria, UA MANY (*)    All other components within normal limits  CBC WITH DIFFERENTIAL/PLATELET  LIPASE, BLOOD  POC URINE PREG, ED    Imaging Review US Abdomen Complete  11/20/2014   CLINICAL DATA:  43 year old female with right upper quadrant abdominal pain. Right side pain with nausea for 1 day. Initial encounter.  EXAM: ULTRASOUND ABDOMEN COMPLETE  COMPARISON:  Abdomen ultrasound 09/07/2012. CT Abdomen and Pelvis 04/11/2007.  FINDINGS: Gallbladder: No gallstones or wall thickening visualized. No sonographic Murphy sign noted.  Common bile duct: Diameter: 5-6 mm, within normal limits  Liver: No focal lesion identified. Within normal limits in parenchymal echogenicity.  IVC: No abnormality visualized.  Pancreas: Visualized portion unremarkable.  Spleen: Size and appearance within normal  limits.  Right Kidney: Length: 10.1 cm. Echogenicity within normal limits. No mass or hydronephrosis visualized.  Left Kidney: Length: 11.1 cm. Echogenicity within normal limits. No mass or hydronephrosis visualized.  Abdominal aorta: Incompletely visualized due to overlying bowel gas, visualized portions within normal limits. No aneurysm identified.  Other findings: None.  IMPRESSION: Negative gallbladder and abdomen ultrasound, no strong sonographic evidence of hepatic steatosis today.   Electronically Signed   By: Augusto Gamble M.D.   On: 11/20/2014 11:21     EKG Interpretation None      MDM   Final diagnoses:  RUQ abdominal pain  Right flank pain  UTI (lower urinary tract infection)    BP 111/64 mmHg  Pulse 71  Temp(Src) 98.2 F (36.8 C) (Oral)  Resp 19  SpO2 100%  I have reviewed nursing notes and vital signs. I personally reviewed the imaging tests through PACS system  I reviewed available ER/hospitalization records thought the EMR     Fayrene Helper, PA-C 11/20/14 1309  Glynn OctaveStephen Rancour, MD 11/20/14 [phone]

## 2014-11-20 NOTE — Discharge Instructions (Signed)

## 2014-11-20 NOTE — ED Notes (Signed)
Pt presents to department for evaluation of R sided flank pain. Onset Tuesday afternoon. 8/10 pain upon arrival to ED. Also states nausea. Pt is alert and oriented x4.

## 2014-11-21 LAB — URINE CULTURE

## 2015-04-24 ENCOUNTER — Emergency Department (INDEPENDENT_AMBULATORY_CARE_PROVIDER_SITE_OTHER): Payer: Medicaid Other

## 2015-04-24 ENCOUNTER — Emergency Department (HOSPITAL_COMMUNITY)
Admission: EM | Admit: 2015-04-24 | Discharge: 2015-04-24 | Disposition: A | Discharge status: Home / Self Care | Payer: Medicaid Other | Source: Home / Self Care | Attending: Family Medicine | Admitting: Family Medicine

## 2015-04-24 ENCOUNTER — Encounter (HOSPITAL_COMMUNITY): Payer: Self-pay | Admitting: Emergency Medicine

## 2015-04-24 DIAGNOSIS — S9002XA Contusion of left ankle, initial encounter: Secondary | ICD-10-CM | POA: Diagnosis not present

## 2015-04-24 IMAGING — DX DG ANKLE COMPLETE 3+V*L*
3 series · 3 of 3 positions shown · non-contrast
Comparison: Left ankle Radiograph dated [DATE]

CLINICAL DATA: 44-year-old female with ankle injury.

EXAM:
LEFT ANKLE COMPLETE - 3+ VIEW

[ankle ap]
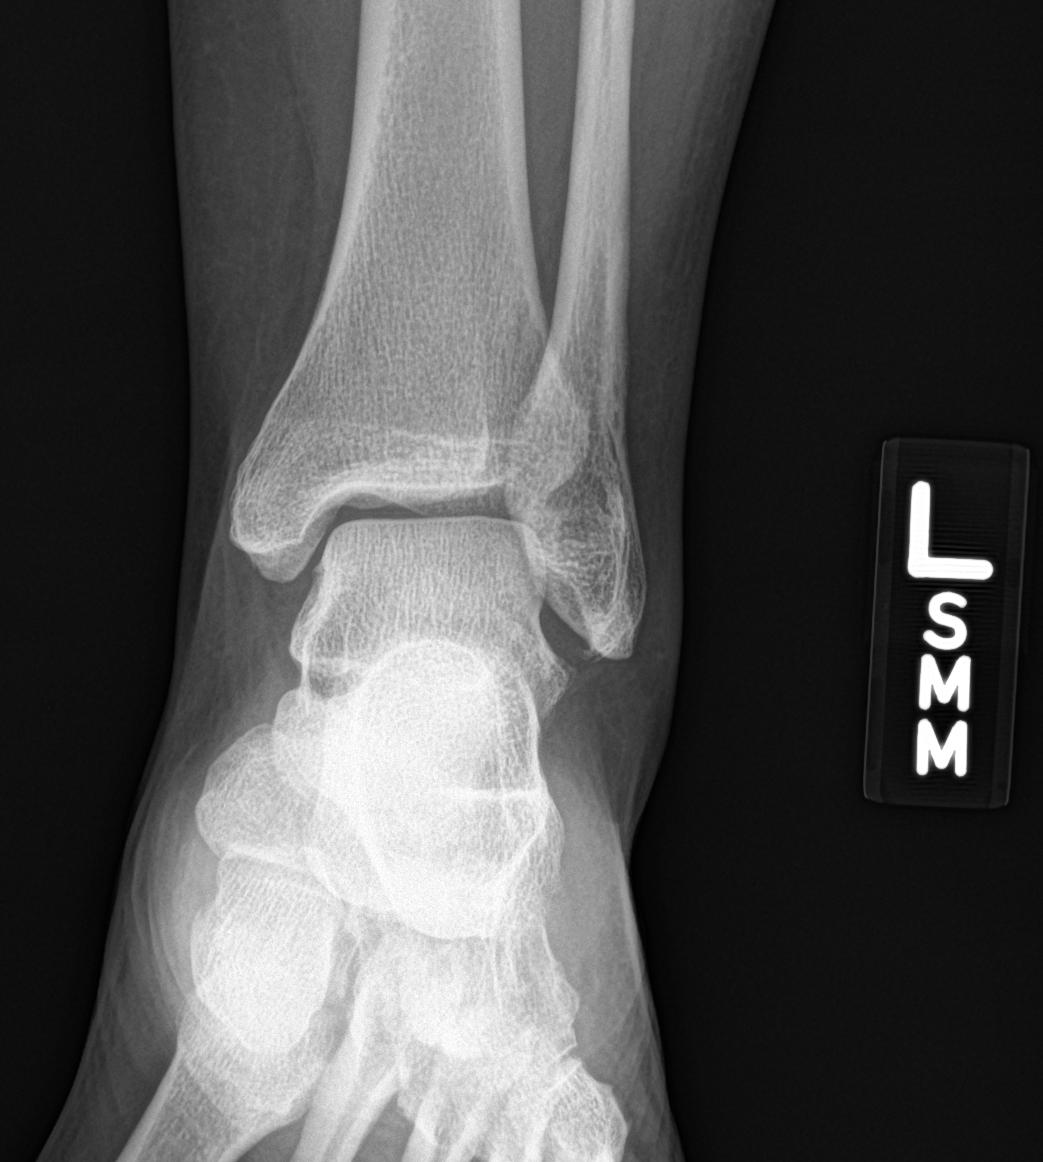

[ankle obl]
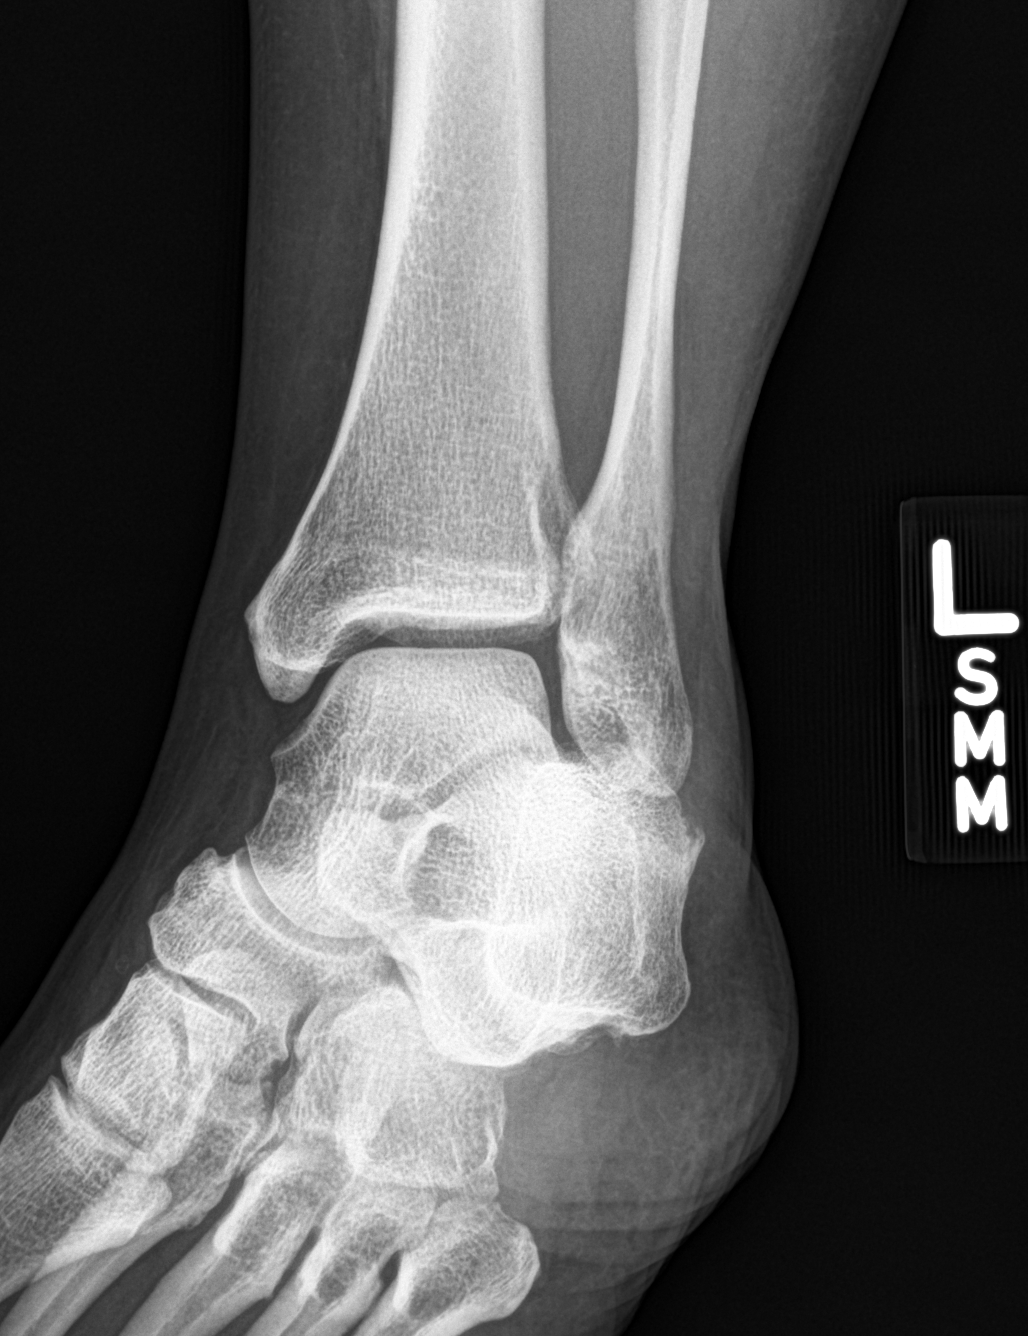

[ankle lat]
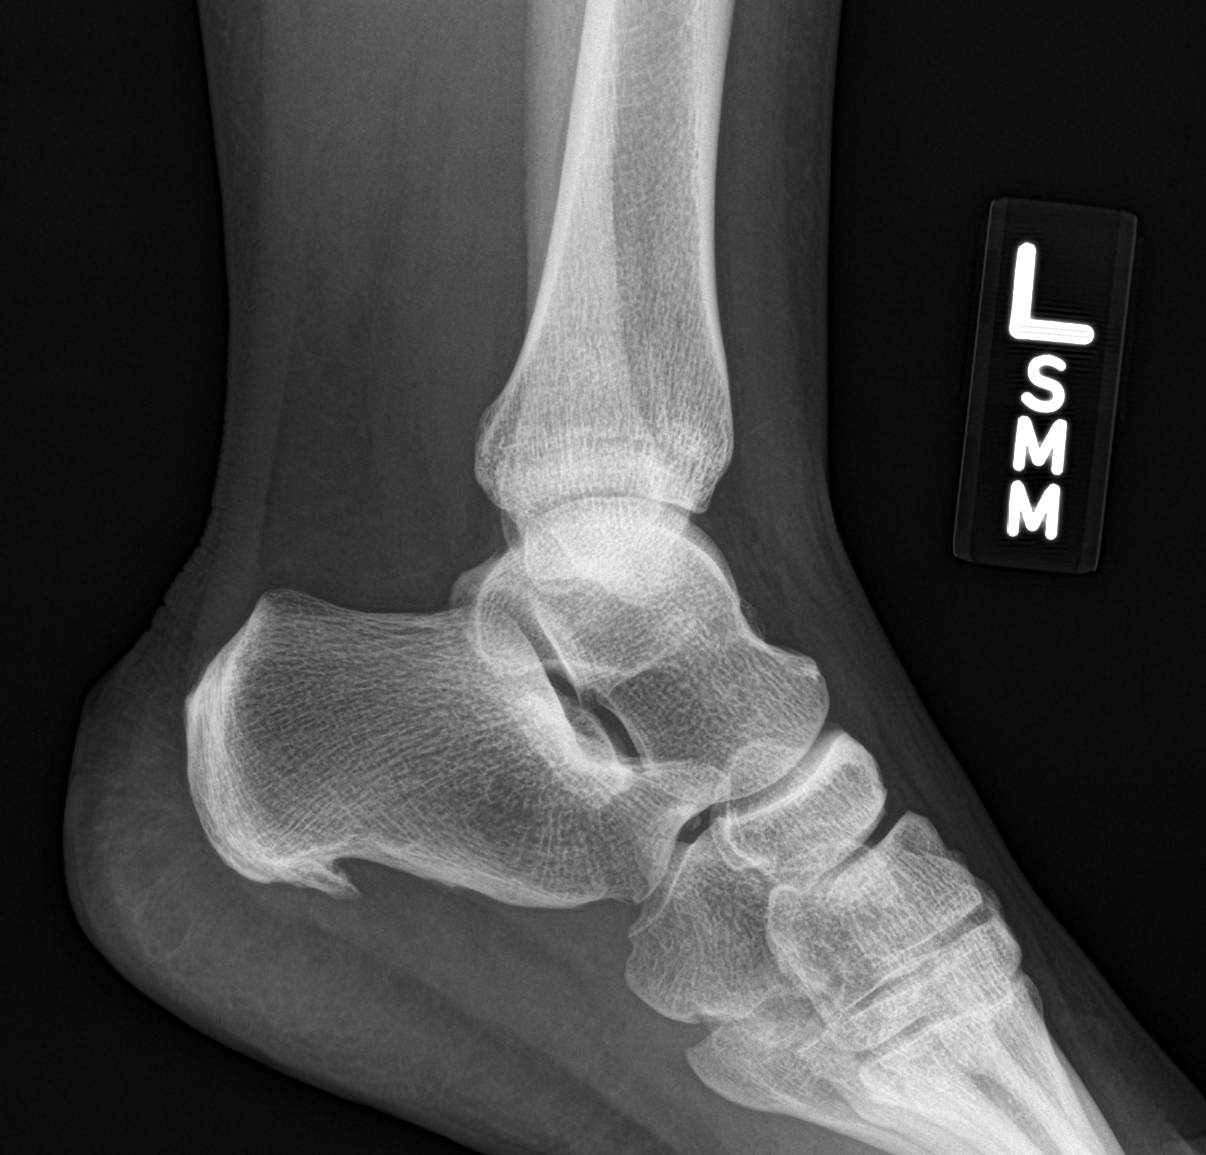

[3 of 3 positions shown; findings below may reference images not displayed]

FINDINGS: There is no evidence of fracture, dislocation, or joint effusion.
There is no evidence of arthropathy or other focal bone abnormality.
Soft tissues are unremarkable. An 8 mm calcaneal spur noted
IMPRESSION: No acute fracture or dislocation.

## 2015-04-24 NOTE — Discharge Instructions (Signed)
Ice and advil as needed for soreness and swelling, activity as tolerated.
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
## 2015-05-19 ENCOUNTER — Other Ambulatory Visit: Payer: Self-pay

## 2015-05-19 DIAGNOSIS — Z1231 Encounter for screening mammogram for malignant neoplasm of breast: Secondary | ICD-10-CM

## 2015-06-04 ENCOUNTER — Emergency Department (HOSPITAL_COMMUNITY)
Admission: EM | Admit: 2015-06-04 | Discharge: 2015-06-04 | Disposition: A | Discharge status: Home / Self Care | Payer: Medicaid Other | Source: Home / Self Care | Attending: Family Medicine | Admitting: Family Medicine

## 2015-06-04 ENCOUNTER — Emergency Department (INDEPENDENT_AMBULATORY_CARE_PROVIDER_SITE_OTHER): Payer: Medicaid Other

## 2015-06-04 ENCOUNTER — Encounter (HOSPITAL_COMMUNITY): Payer: Self-pay | Admitting: *Deleted

## 2015-06-04 DIAGNOSIS — S63619A Unspecified sprain of unspecified finger, initial encounter: Secondary | ICD-10-CM

## 2015-06-04 IMAGING — DX DG FINGER RING 2+V*R*
3 series · 3 of 3 positions shown · non-contrast
Comparison: None.

CLINICAL DATA: Right ring finger pain/injury

EXAM:
RIGHT RING FINGER 2+V

[finger ap]
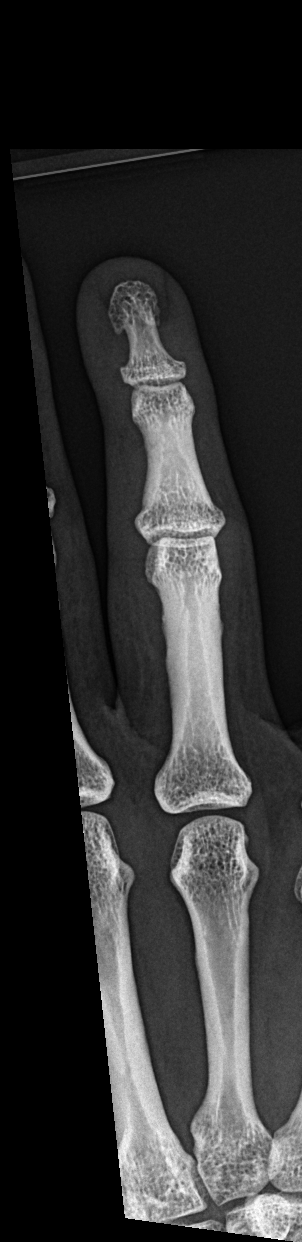

[finger obl]
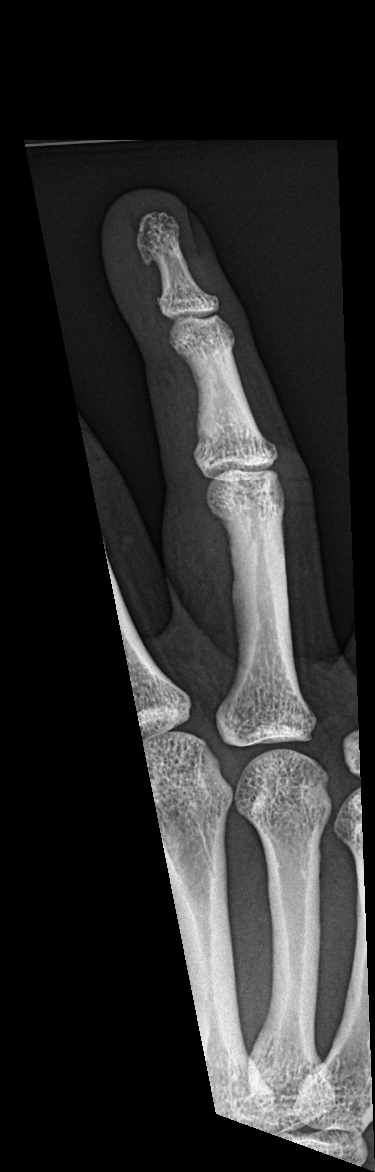

[finger lat]
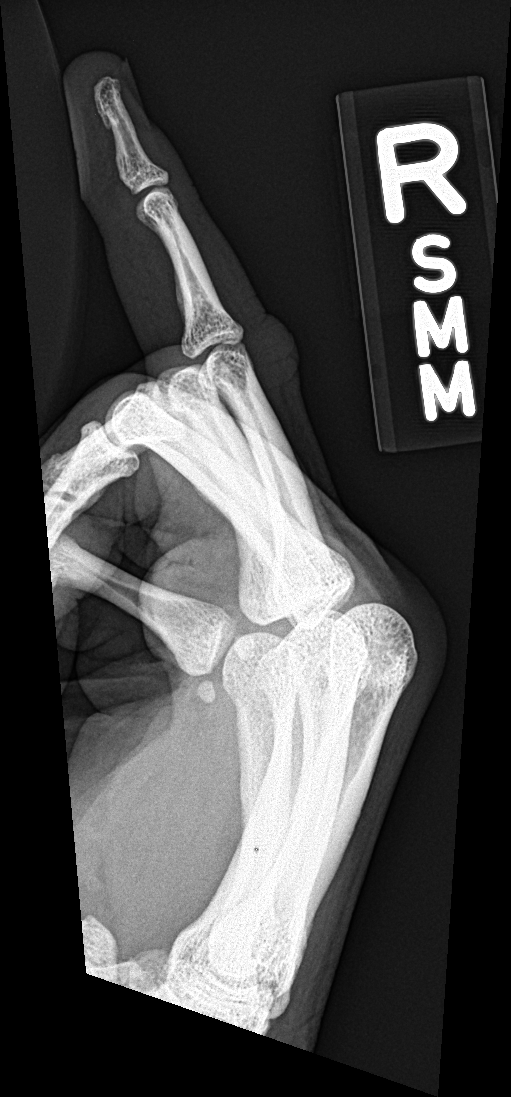

[3 of 3 positions shown; findings below may reference images not displayed]

FINDINGS: No fracture or dislocation is seen.

The joint spaces are preserved.

The visualized soft tissues are unremarkable.
IMPRESSION: No fracture or dislocation is seen.

## 2015-06-04 NOTE — Discharge Instructions (Signed)
Soak in warm water twice a day, tape for comfort, advil for soreness and see your doctor if further problems.

## 2015-06-04 NOTE — ED Notes (Signed)
Pt  Reports       She injured her  r  Ring  Finger      When her  Finger  Got caught       In   A  Dog leash            Pt  Has  Pain  On palpation  And       Has  A  Decreased  rom

## 2015-06-04 NOTE — ED Provider Notes (Signed)
CSN: [id number]     Arrival date & time 06/04/15  1611 History   First MD Initiated Contact with Patient 06/04/15 1625     Chief Complaint  Patient presents with  . Finger Injury   (Consider location/radiation/quality/duration/timing/severity/associated sxs/prior Treatment) Patient is a 44 y.o. female presenting with hand pain. The history is provided by the patient.  Hand Pain This is a new problem. The current episode started yesterday (dog ran and rrf got caught in leash and pulled). The problem has been gradually worsening.    Past Medical History  Diagnosis Date  . Anxiety   . Asthma   . Thyroid disease   . Hx of migraines   . Hyperlipidemia   . GERD (gastroesophageal reflux disease)   . Hepatomegaly    Past Surgical History  Procedure Laterality Date  . Cesarean section    . Btl  07/30/2008  . Wisdom tooth extraction    . Tubal ligation    . Hysteroscopy w/ endometrial ablation     Family History  Problem Relation Age of Onset  . Diabetes Father   . Hypertension Father   . Hyperlipidemia Father   . Thyroid disease Father   . Asthma Father   . Cancer Mother   . Hypertension Mother   . Thyroid disease Mother   . Cancer Sister     CERVICAL  . Thyroid disease Sister   . Asthma Sister   . Asthma Brother    Social History  Substance Use Topics  . Smoking status: Never Smoker   . Smokeless tobacco: Never Used  . Alcohol Use: Yes     Comment: socially   OB History    Gravida Para Term Preterm AB TAB SAB Ectopic Multiple Living   1 1        1      Review of Systems  Unable to perform ROS Constitutional: Negative.   Musculoskeletal: Positive for joint swelling. Negative for myalgias, neck pain and neck stiffness.  Skin: Negative.     Allergies  Shellfish allergy; Peanut-containing drug products; Percocet; and Sulfa antibiotics  Home Medications   Prior to Admission medications   Medication Sig Start Date End Date Taking? Authorizing Provider    azithromycin (ZITHROMAX) 250 MG tablet Take 1 tablet (250 mg total) by mouth daily. Take first 2 tablets together, then 1 every day until finished. Patient not taking: Reported on 11/20/2014 12/11/12   Elpidio Anis, PA-C  cephALEXin (KEFLEX) 500 MG capsule Take 2 capsules (1,000 mg total) by mouth 2 (two) times daily. 11/20/14   Fayrene Helper, PA-C  cetirizine (ZYRTEC) 10 MG tablet Take 10 mg by mouth daily.    Historical Provider, MD  ibuprofen (ADVIL,MOTRIN) 800 MG tablet Take 1 tablet (800 mg total) by mouth every 8 (eight) hours as needed for mild pain or moderate pain. 09/27/14   Trixie Dredge, PA-C  phentermine (ADIPEX-P) 37.5 MG tablet Take 37.5 mg by mouth daily before breakfast.    Historical Provider, MD  traMADol (ULTRAM) 50 MG tablet Take 1 tablet (50 mg total) by mouth every 6 (six) hours as needed for moderate pain. 11/20/14   Fayrene Helper, PA-C   BP 141/89 mmHg  Pulse 86  Temp(Src) 98.6 F (37 C) (Oral)  Resp 16  SpO2 96% Physical Exam  Constitutional: She is oriented to person, place, and time. She appears well-developed and well-nourished. No distress.  Musculoskeletal: She exhibits tenderness.       Hands: Neurological: She is alert and oriented  to person, place, and time.  Skin: Skin is warm and dry.  Nursing note and vitals reviewed.   ED Course  Procedures (including critical care time) Labs Review Labs Reviewed - No data to display  Imaging Review Dg Finger Ring Right  06/04/2015   CLINICAL DATA:  Right ring finger pain/injury  EXAM: RIGHT RING FINGER 2+V  COMPARISON:  None.  FINDINGS: No fracture or dislocation is seen.  The joint spaces are preserved.  The visualized soft tissues are unremarkable.  IMPRESSION: No fracture or dislocation is seen.   Electronically Signed   By: Charline Bills M.D.   On: 06/04/2015 16:49     MDM   1. Sprain of finger of right hand, initial encounter        Linna Hoff, MD 06/04/15 1714

## 2015-06-25 ENCOUNTER — Ambulatory Visit
Admission: RE | Admit: 2015-06-25 | Discharge: 2015-06-25 | Disposition: A | Discharge status: Home / Self Care | Payer: Medicaid Other | Source: Ambulatory Visit

## 2015-06-25 DIAGNOSIS — Z1231 Encounter for screening mammogram for malignant neoplasm of breast: Secondary | ICD-10-CM

## 2015-06-27 ENCOUNTER — Other Ambulatory Visit: Payer: Self-pay | Admitting: Internal Medicine

## 2015-06-27 ENCOUNTER — Ambulatory Visit: Payer: Medicaid Other

## 2015-06-27 DIAGNOSIS — R928 Other abnormal and inconclusive findings on diagnostic imaging of breast: Secondary | ICD-10-CM

## 2015-07-04 ENCOUNTER — Ambulatory Visit
Admission: RE | Admit: 2015-07-04 | Discharge: 2015-07-04 | Disposition: A | Discharge status: Home / Self Care | Payer: Medicaid Other | Source: Ambulatory Visit | Attending: Internal Medicine | Admitting: Internal Medicine

## 2015-07-04 DIAGNOSIS — R928 Other abnormal and inconclusive findings on diagnostic imaging of breast: Secondary | ICD-10-CM

## 2016-05-28 ENCOUNTER — Other Ambulatory Visit: Payer: Self-pay | Admitting: Internal Medicine

## 2016-05-28 DIAGNOSIS — Z1231 Encounter for screening mammogram for malignant neoplasm of breast: Secondary | ICD-10-CM

## 2016-06-03 ENCOUNTER — Ambulatory Visit (HOSPITAL_COMMUNITY)
Admission: EM | Admit: 2016-06-03 | Discharge: 2016-06-03 | Disposition: A | Discharge status: Home / Self Care | Payer: Medicaid Other | Attending: Family Medicine | Admitting: Family Medicine

## 2016-06-03 ENCOUNTER — Ambulatory Visit (INDEPENDENT_AMBULATORY_CARE_PROVIDER_SITE_OTHER): Payer: Medicaid Other

## 2016-06-03 ENCOUNTER — Encounter (HOSPITAL_COMMUNITY): Payer: Self-pay | Admitting: Family Medicine

## 2016-06-03 DIAGNOSIS — W19XXXA Unspecified fall, initial encounter: Secondary | ICD-10-CM | POA: Diagnosis not present

## 2016-06-03 DIAGNOSIS — M542 Cervicalgia: Secondary | ICD-10-CM | POA: Diagnosis not present

## 2016-06-03 DIAGNOSIS — S63619A Unspecified sprain of unspecified finger, initial encounter: Secondary | ICD-10-CM | POA: Diagnosis not present

## 2016-06-03 IMAGING — DX DG CERVICAL SPINE COMPLETE 4+V
5 series · 5 of 5 positions shown · non-contrast
Comparison: CT of the cervical spine performed [DATE]

CLINICAL DATA: Acute onset of pain at the base of the neck. Initial
encounter.

EXAM:
CERVICAL SPINE - COMPLETE 4+ VIEW

[c-spine lat]
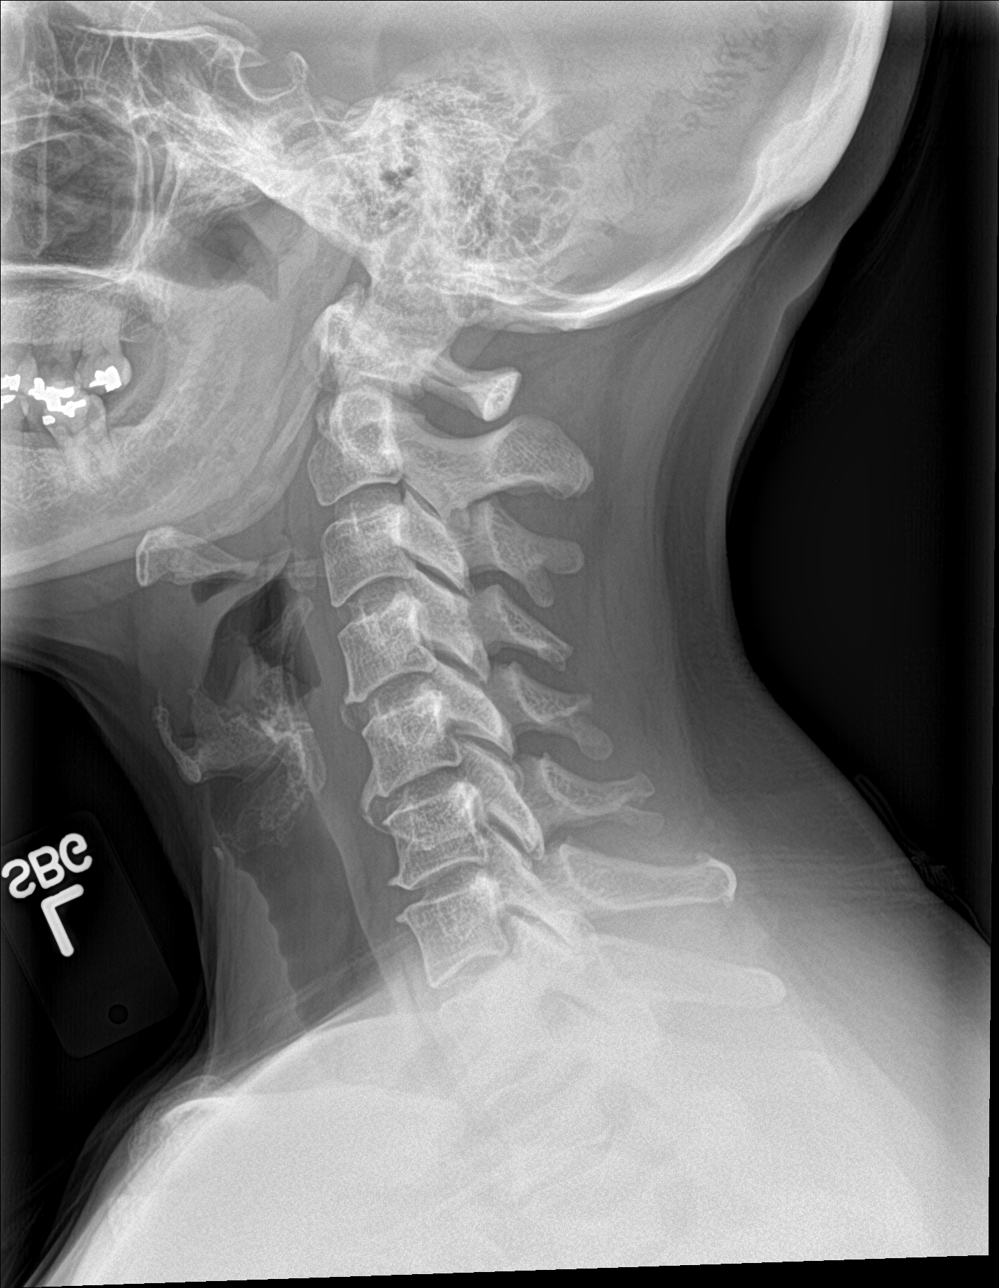

[c-spine obl (1 of 2)]
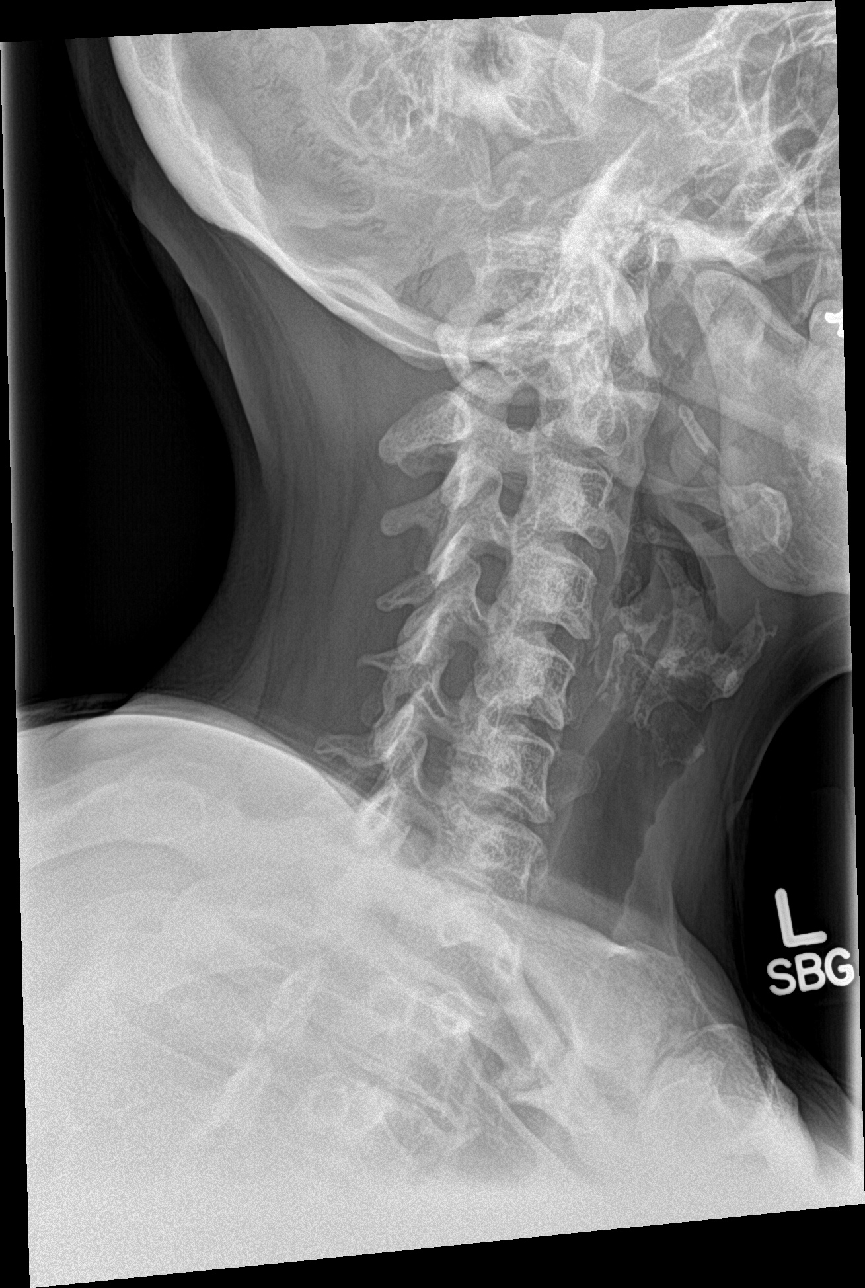

[c-spine obl (2 of 2)]
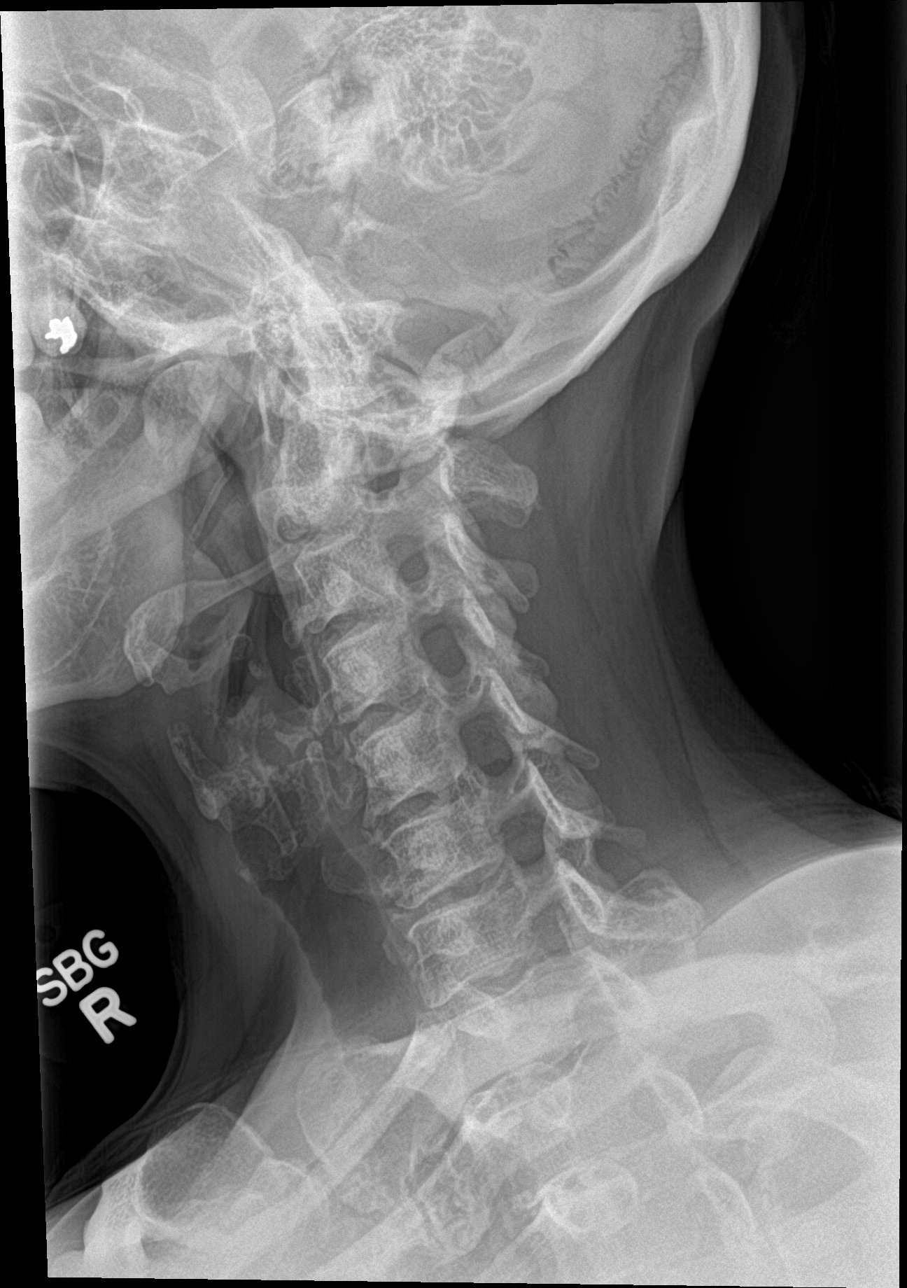

[c-spine ap]
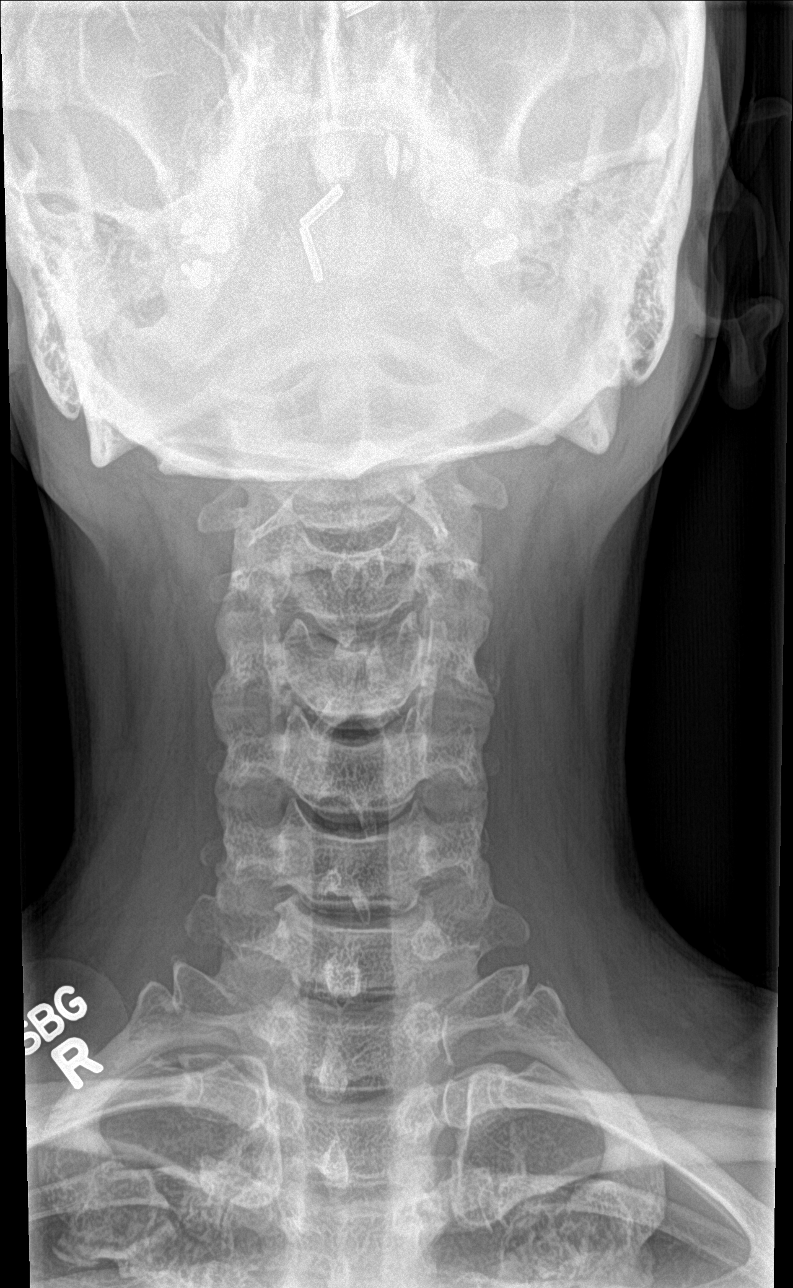

[c-spine open mouth]
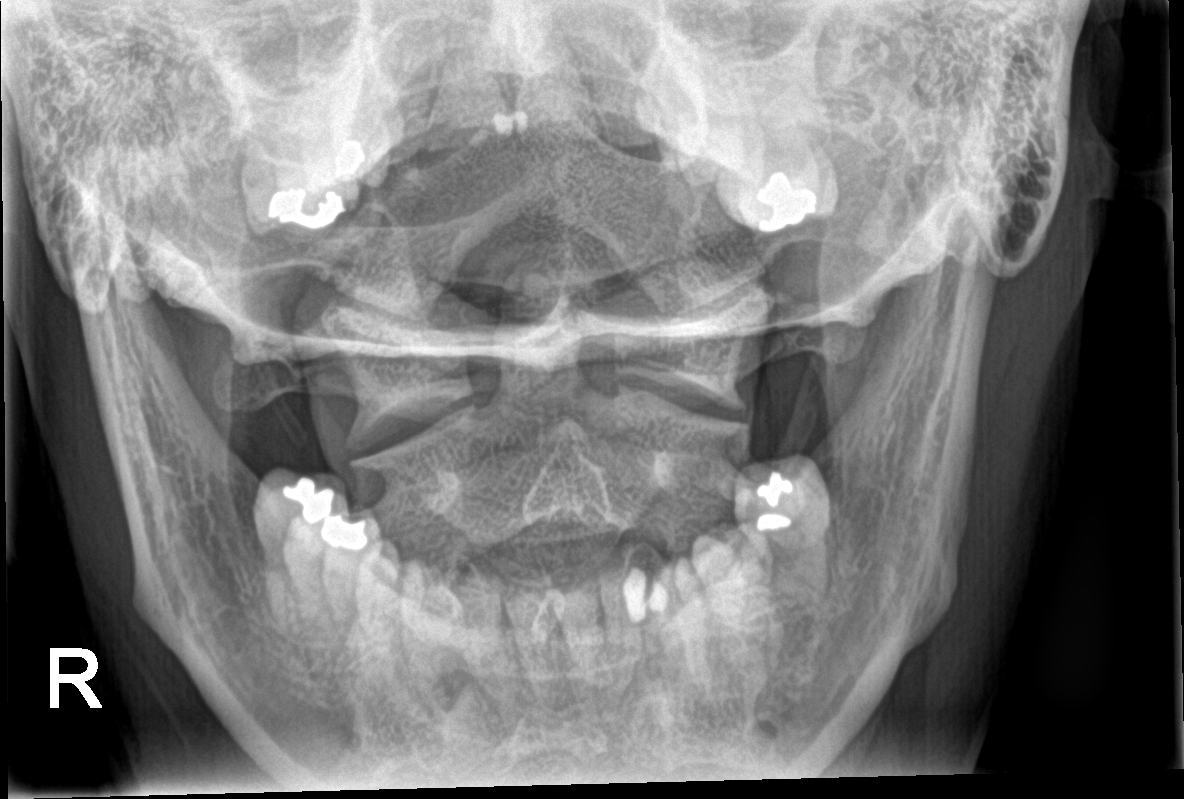

[5 of 5 positions shown; findings below may reference images not displayed]

FINDINGS: There is no evidence of fracture or subluxation. Vertebral bodies
demonstrate normal height and alignment. Intervertebral disc spaces
are preserved. Prevertebral soft tissues are within normal limits.
The provided odontoid view demonstrates no significant abnormality.
Small anterior osteophytes are seen along the lower cervical spine.

The visualized lung apices are clear.
IMPRESSION: No evidence of fracture or subluxation along the cervical spine.

## 2016-06-03 MED ORDER — TRAMADOL HCL 50 MG PO TABS: 50.0000 mg | ORAL_TABLET | Freq: Four times a day (QID) | ORAL | 0 refills | Status: DC | PRN
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
## 2016-06-03 NOTE — Discharge Instructions (Signed)
The x-rays do not show any fracture. Nevertheless, you may find that your neck is quite stiff and sore for the next week. Generally anti-inflammatories such as ibuprofen or Aleve are helpful as are hot compresses.  I've written a short course of tramadol to control your pain, particularly allowing you to sleep at night.  If you are not getting better in the next few days, he can either return here or see your personal physician for further evaluation and treatment.

## 2016-06-25 ENCOUNTER — Ambulatory Visit
Admission: RE | Admit: 2016-06-25 | Discharge: 2016-06-25 | Disposition: A | Discharge status: Home / Self Care | Payer: Medicaid Other | Source: Ambulatory Visit | Attending: Internal Medicine | Admitting: Internal Medicine

## 2016-06-25 DIAGNOSIS — Z1231 Encounter for screening mammogram for malignant neoplasm of breast: Secondary | ICD-10-CM

## 2016-07-25 ENCOUNTER — Ambulatory Visit (INDEPENDENT_AMBULATORY_CARE_PROVIDER_SITE_OTHER): Payer: Medicaid Other

## 2016-07-25 ENCOUNTER — Encounter (HOSPITAL_COMMUNITY): Payer: Self-pay | Admitting: Emergency Medicine

## 2016-07-25 ENCOUNTER — Ambulatory Visit (HOSPITAL_COMMUNITY)
Admission: EM | Admit: 2016-07-25 | Discharge: 2016-07-25 | Disposition: A | Discharge status: Home / Self Care | Payer: Medicaid Other | Attending: Internal Medicine | Admitting: Internal Medicine

## 2016-07-25 DIAGNOSIS — M533 Sacrococcygeal disorders, not elsewhere classified: Secondary | ICD-10-CM
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
## 2016-07-25 IMAGING — DX DG SI JOINTS 3+V
1 series · 1 of 1 positions shown · non-contrast
Comparison: Pelvis film from earlier same day

CLINICAL DATA: Single view of the right SI joint

EXAM:
BILATERAL SACROILIAC JOINTS - 3+ VIEW

[si joint]
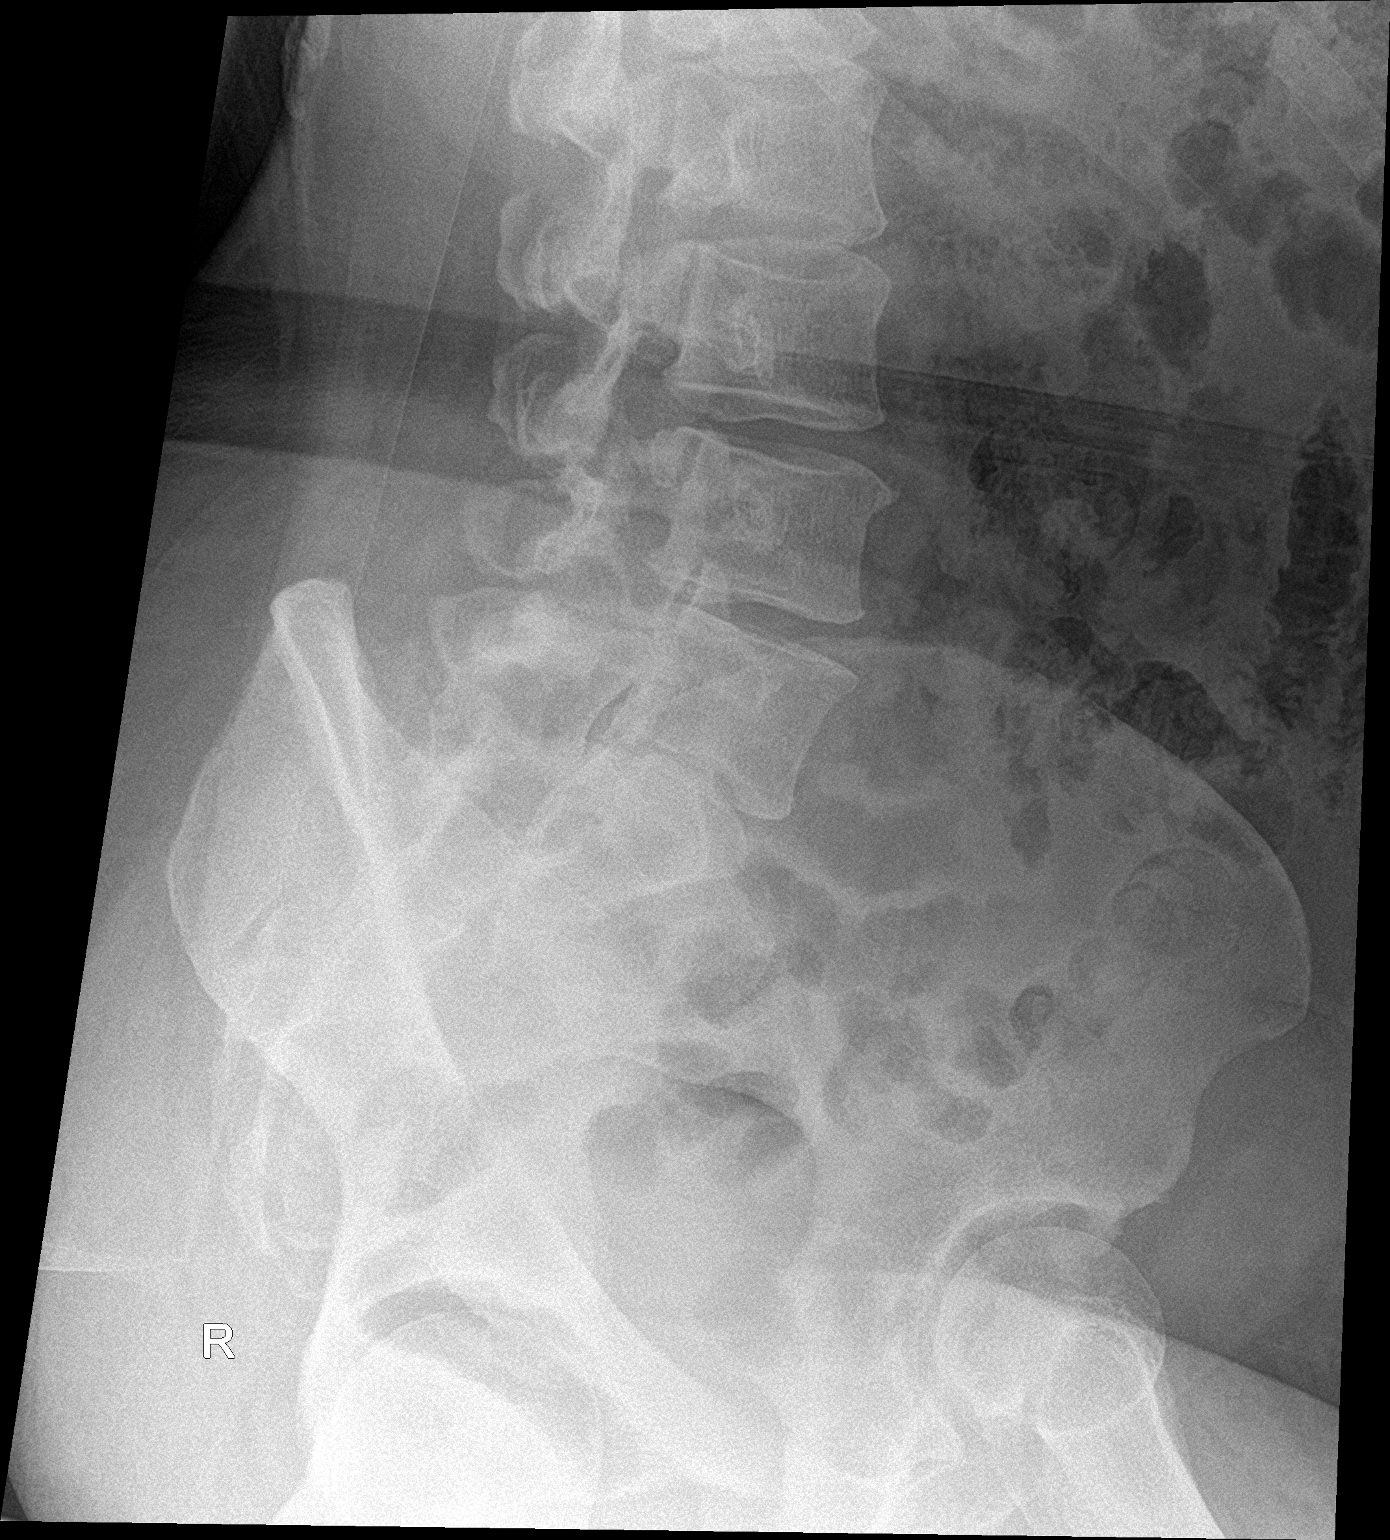

[1 of 1 positions shown; findings below may reference images not displayed]

FINDINGS: A single view of the right SI joint is limited but no fractures are
seen.
IMPRESSION: No fractures identified on a single limited right SI joint film.

## 2016-07-25 MED ORDER — TRAMADOL HCL 50 MG PO TABS: 50.0000 mg | ORAL_TABLET | Freq: Four times a day (QID) | ORAL | 0 refills | Status: DC | PRN

## 2016-07-25 MED ORDER — NAPROXEN 500 MG PO TABS: 500.0000 mg | ORAL_TABLET | Freq: Two times a day (BID) | ORAL | 0 refills | Status: DC
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
## 2016-07-25 NOTE — Discharge Instructions (Addendum)
Ice may help decrease pain/inflammation.  Prescriptions for naproxen (anti inflammatory) and tramadol (pain medicine) were given.  We will call you with xray result.  Followup with Fleet ContrasEdwin Avbuere for further evaluation if pain does not subside over the next week or two.

## 2016-09-21 ENCOUNTER — Encounter (HOSPITAL_BASED_OUTPATIENT_CLINIC_OR_DEPARTMENT_OTHER): Payer: Self-pay | Admitting: *Deleted

## 2016-09-21 ENCOUNTER — Other Ambulatory Visit: Payer: Self-pay | Admitting: Orthopedic Surgery

## 2016-09-22 ENCOUNTER — Ambulatory Visit (HOSPITAL_BASED_OUTPATIENT_CLINIC_OR_DEPARTMENT_OTHER): Payer: Medicaid Other | Admitting: Anesthesiology

## 2016-09-22 ENCOUNTER — Encounter (HOSPITAL_BASED_OUTPATIENT_CLINIC_OR_DEPARTMENT_OTHER): Payer: Self-pay | Admitting: *Deleted

## 2016-09-22 ENCOUNTER — Ambulatory Visit (HOSPITAL_BASED_OUTPATIENT_CLINIC_OR_DEPARTMENT_OTHER)
Admission: RE | Admit: 2016-09-22 | Discharge: 2016-09-22 | Disposition: A | Discharge status: Home / Self Care | Payer: Medicaid Other | Source: Ambulatory Visit | Attending: Orthopedic Surgery | Admitting: Orthopedic Surgery

## 2016-09-22 ENCOUNTER — Encounter (HOSPITAL_BASED_OUTPATIENT_CLINIC_OR_DEPARTMENT_OTHER)
Admission: RE | Disposition: A | Discharge status: Home / Self Care | Payer: Self-pay | Source: Ambulatory Visit | Attending: Orthopedic Surgery

## 2016-09-22 DIAGNOSIS — K219 Gastro-esophageal reflux disease without esophagitis: Secondary | ICD-10-CM | POA: Diagnosis not present

## 2016-09-22 DIAGNOSIS — G5602 Carpal tunnel syndrome, left upper limb: Secondary | ICD-10-CM | POA: Diagnosis not present

## 2016-09-22 DIAGNOSIS — Z6838 Body mass index (BMI) 38.0-38.9, adult: Secondary | ICD-10-CM | POA: Insufficient documentation

## 2016-09-22 DIAGNOSIS — J45909 Unspecified asthma, uncomplicated: Secondary | ICD-10-CM | POA: Insufficient documentation

## 2016-09-22 DIAGNOSIS — F419 Anxiety disorder, unspecified: Secondary | ICD-10-CM | POA: Insufficient documentation

## 2016-09-22 DIAGNOSIS — M659 Synovitis and tenosynovitis, unspecified: Secondary | ICD-10-CM | POA: Diagnosis not present

## 2016-09-22 DIAGNOSIS — M25532 Pain in left wrist: Secondary | ICD-10-CM | POA: Diagnosis present

## 2016-09-22 HISTORY — PX: DORSAL COMPARTMENT RELEASE: SHX5039

## 2016-09-22 HISTORY — PX: CARPAL TUNNEL RELEASE: SHX101

## 2016-09-22 SURGERY — CARPAL TUNNEL RELEASE
Anesthesia: General | Site: Wrist | Laterality: Left

## 2016-09-22 MED ORDER — PROMETHAZINE HCL 25 MG/ML IJ SOLN
INTRAMUSCULAR | Status: AC
  Filled 2016-09-22: qty 1

## 2016-09-22 MED ORDER — KETOROLAC TROMETHAMINE 30 MG/ML IJ SOLN
INTRAMUSCULAR | Status: DC | PRN
  Administered 2016-09-22: 30 mg via INTRAVENOUS

## 2016-09-22 MED ORDER — LACTATED RINGERS IV SOLN
INTRAVENOUS | Status: DC
  Administered 2016-09-22 (×2): via INTRAVENOUS

## 2016-09-22 MED ORDER — DEXAMETHASONE SODIUM PHOSPHATE 10 MG/ML IJ SOLN
INTRAMUSCULAR | Status: DC | PRN
  Administered 2016-09-22: 10 mg via INTRAVENOUS

## 2016-09-22 MED ORDER — FENTANYL CITRATE (PF) 100 MCG/2ML IJ SOLN
50.0000 ug | INTRAMUSCULAR | Status: DC | PRN
  Administered 2016-09-22: 100 ug via INTRAVENOUS

## 2016-09-22 MED ORDER — CHLORHEXIDINE GLUCONATE 4 % EX LIQD
60.0000 mL | Freq: Once | CUTANEOUS | Status: DC
Start: 2016-09-22 — End: 2016-09-22

## 2016-09-22 MED ORDER — MIDAZOLAM HCL 2 MG/2ML IJ SOLN
1.0000 mg | INTRAMUSCULAR | Status: DC | PRN
  Administered 2016-09-22: 2 mg via INTRAVENOUS

## 2016-09-22 MED ORDER — PROPOFOL 10 MG/ML IV BOLUS
INTRAVENOUS | Status: DC | PRN
  Administered 2016-09-22: 200 mg via INTRAVENOUS

## 2016-09-22 MED ORDER — MIDAZOLAM HCL 2 MG/2ML IJ SOLN
INTRAMUSCULAR | Status: AC
  Filled 2016-09-22: qty 2

## 2016-09-22 MED ORDER — HYDROCODONE-ACETAMINOPHEN 7.5-325 MG PO TABS: 1.0000 | ORAL_TABLET | Freq: Once | ORAL | Status: DC | PRN

## 2016-09-22 MED ORDER — PROMETHAZINE HCL 25 MG/ML IJ SOLN
6.2500 mg | INTRAMUSCULAR | Status: DC | PRN
  Administered 2016-09-22: 6.25 mg via INTRAVENOUS

## 2016-09-22 MED ORDER — HYDROMORPHONE HCL 1 MG/ML IJ SOLN: 0.2500 mg | INTRAMUSCULAR | Status: DC | PRN

## 2016-09-22 MED ORDER — HYDROCODONE-IBUPROFEN 5-200 MG PO TABS: 1.0000 | ORAL_TABLET | Freq: Three times a day (TID) | ORAL | 0 refills | Status: DC | PRN

## 2016-09-22 MED ORDER — MEPERIDINE HCL 25 MG/ML IJ SOLN: 6.2500 mg | INTRAMUSCULAR | Status: DC | PRN

## 2016-09-22 MED ORDER — CEFAZOLIN SODIUM-DEXTROSE 2-4 GM/100ML-% IV SOLN: 2.0000 g | INTRAVENOUS | Status: DC

## 2016-09-22 MED ORDER — FENTANYL CITRATE (PF) 100 MCG/2ML IJ SOLN
INTRAMUSCULAR | Status: AC
  Filled 2016-09-22: qty 2

## 2016-09-22 MED ORDER — CEFAZOLIN SODIUM-DEXTROSE 2-4 GM/100ML-% IV SOLN
INTRAVENOUS | Status: AC
  Filled 2016-09-22: qty 100

## 2016-09-22 MED ORDER — BUPIVACAINE HCL (PF) 0.25 % IJ SOLN
INTRAMUSCULAR | Status: DC | PRN
  Administered 2016-09-22: 10 mL

## 2016-09-22 MED ORDER — ONDANSETRON HCL 4 MG/2ML IJ SOLN
INTRAMUSCULAR | Status: DC | PRN
Start: 2016-09-22 — End: 2016-09-22
  Administered 2016-09-22: 4 mg via INTRAVENOUS

## 2016-09-22 MED ORDER — SCOPOLAMINE 1 MG/3DAYS TD PT72: 1.0000 | MEDICATED_PATCH | Freq: Once | TRANSDERMAL | Status: DC | PRN

## 2016-09-22 MED ORDER — LIDOCAINE 2% (20 MG/ML) 5 ML SYRINGE
INTRAMUSCULAR | Status: DC | PRN
  Administered 2016-09-22: 100 mg via INTRAVENOUS

## 2016-09-22 MED ORDER — CEFAZOLIN SODIUM-DEXTROSE 2-3 GM-% IV SOLR
INTRAVENOUS | Status: DC | PRN
  Administered 2016-09-22: 2 g via INTRAVENOUS

## 2016-09-22 SURGICAL SUPPLY — 60 items
APL SKNCLS STERI-STRIP NONHPOA (GAUZE/BANDAGES/DRESSINGS) ×1
BANDAGE ACE 4X5 VEL STRL LF (GAUZE/BANDAGES/DRESSINGS) ×2 IMPLANT
BENZOIN TINCTURE PRP APPL 2/3 (GAUZE/BANDAGES/DRESSINGS) ×2 IMPLANT
BLADE SURG 15 STRL LF DISP TIS (BLADE) ×1 IMPLANT
BLADE SURG 15 STRL SS (BLADE) ×2
BNDG CMPR 9X4 STRL LF SNTH (GAUZE/BANDAGES/DRESSINGS) ×1
BNDG ELASTIC 2X5.8 VLCR STR LF (GAUZE/BANDAGES/DRESSINGS) ×1 IMPLANT
BNDG ESMARK 4X9 LF (GAUZE/BANDAGES/DRESSINGS) ×1 IMPLANT
BNDG GAUZE ELAST 4 BULKY (GAUZE/BANDAGES/DRESSINGS) ×2 IMPLANT
CANISTER SUCT 1200ML W/VALVE (MISCELLANEOUS) IMPLANT
CORDS BIPOLAR (ELECTRODE) ×1 IMPLANT
COVER BACK TABLE 60X90IN (DRAPES) ×2 IMPLANT
CUFF TOURNIQUET SINGLE 18IN (TOURNIQUET CUFF) ×1 IMPLANT
DECANTER SPIKE VIAL GLASS SM (MISCELLANEOUS) IMPLANT
DRAPE EXTREMITY T 121X128X90 (DRAPE) ×2 IMPLANT
DRAPE SURG 17X23 STRL (DRAPES) ×2 IMPLANT
DRSG PAD ABDOMINAL 8X10 ST (GAUZE/BANDAGES/DRESSINGS) IMPLANT
DURAPREP 26ML APPLICATOR (WOUND CARE) ×2 IMPLANT
GAUZE SPONGE 4X4 12PLY STRL (GAUZE/BANDAGES/DRESSINGS) ×2 IMPLANT
GAUZE SPONGE 4X4 16PLY XRAY LF (GAUZE/BANDAGES/DRESSINGS) IMPLANT
GAUZE XEROFORM 1X8 LF (GAUZE/BANDAGES/DRESSINGS) IMPLANT
GLOVE BIOGEL PI IND STRL 7.5 (GLOVE) IMPLANT
GLOVE BIOGEL PI INDICATOR 7.5 (GLOVE) ×1
GLOVE SURG SYN 7.5  E (GLOVE) ×1
GLOVE SURG SYN 7.5 E (GLOVE) ×1 IMPLANT
GLOVE SURG SYN 7.5 PF PI (GLOVE) IMPLANT
GLOVE SURG SYN 8.0 (GLOVE) ×2 IMPLANT
GLOVE SURG SYN 8.0 PF PI (GLOVE) ×2 IMPLANT
GOWN STRL REUS W/ TWL LRG LVL3 (GOWN DISPOSABLE) ×1 IMPLANT
GOWN STRL REUS W/TWL LRG LVL3 (GOWN DISPOSABLE) ×2
GOWN STRL REUS W/TWL XL LVL3 (GOWN DISPOSABLE) ×4 IMPLANT
NDL HYPO 25X1 1.5 SAFETY (NEEDLE) ×1 IMPLANT
NEEDLE HYPO 25X1 1.5 SAFETY (NEEDLE) ×2 IMPLANT
NS IRRIG 1000ML POUR BTL (IV SOLUTION) ×2 IMPLANT
PACK BASIN DAY SURGERY FS (CUSTOM PROCEDURE TRAY) ×2 IMPLANT
PAD CAST 3X4 CTTN HI CHSV (CAST SUPPLIES) ×1 IMPLANT
PADDING CAST ABS 4INX4YD NS (CAST SUPPLIES) ×1
PADDING CAST ABS COTTON 4X4 ST (CAST SUPPLIES) ×1 IMPLANT
PADDING CAST COTTON 3X4 STRL (CAST SUPPLIES) ×2
PADDING UNDERCAST 2 STRL (CAST SUPPLIES) ×1
PADDING UNDERCAST 2X4 STRL (CAST SUPPLIES) ×1 IMPLANT
SHEET MEDIUM DRAPE 40X70 STRL (DRAPES) ×2 IMPLANT
SLING ARM FOAM STRAP LRG (SOFTGOODS) IMPLANT
SLING ARM MED ADULT FOAM STRAP (SOFTGOODS) IMPLANT
SPLINT PLASTER CAST XFAST 3X15 (CAST SUPPLIES) ×5 IMPLANT
SPLINT PLASTER CAST XFAST 4X15 (CAST SUPPLIES) ×5 IMPLANT
SPLINT PLASTER XTRA FAST SET 4 (CAST SUPPLIES) ×5
SPLINT PLASTER XTRA FASTSET 3X (CAST SUPPLIES) ×5
STOCKINETTE 4X48 STRL (DRAPES) ×2 IMPLANT
STRIP CLOSURE SKIN 1/2X4 (GAUZE/BANDAGES/DRESSINGS) ×3 IMPLANT
SUCTION FRAZIER HANDLE 10FR (MISCELLANEOUS)
SUCTION TUBE FRAZIER 10FR DISP (MISCELLANEOUS) IMPLANT
SUT PROLENE 3 0 PS 2 (SUTURE) ×2 IMPLANT
SUT VICRYL RAPIDE 4-0 (SUTURE) IMPLANT
SUT VICRYL RAPIDE 4/0 PS 2 (SUTURE) IMPLANT
SYR 10ML LL (SYRINGE) ×2 IMPLANT
SYR BULB 3OZ (MISCELLANEOUS) ×2 IMPLANT
TOWEL OR 17X24 6PK STRL BLUE (TOWEL DISPOSABLE) ×2 IMPLANT
TUBE CONNECTING 20X1/4 (TUBING) IMPLANT
UNDERPAD 30X30 (UNDERPADS AND DIAPERS) ×2 IMPLANT

## 2016-09-22 NOTE — Op Note (Signed)
See dictated note [phone]

## 2016-09-22 NOTE — Transfer of Care (Signed)
Immediate Anesthesia Transfer of Care Note  Patient: Bridget Stevens  Procedure(s) Performed: Procedure(s): CARPAL TUNNEL RELEASE, left (Left) RELEASE DORSAL COMPARTMENT (DEQUERVAIN) (Left)  Patient Location: PACU  Anesthesia Type:General  Level of Consciousness: sedated  Airway & Oxygen Therapy: Patient Spontanous Breathing and Patient connected to face mask oxygen  Post-op Assessment: Report given to RN and Post -op Vital signs reviewed and stable  Post vital signs: Reviewed and stable  Last Vitals:  Vitals:   09/22/16 0844 09/22/16 0950  BP: 123/72 110/68  Pulse: 75 71  Resp: 16   Temp: 36.7 C     Last Pain:  Vitals:   09/22/16 0844  TempSrc: Oral  PainSc: 9       Patients Stated Pain Goal: 4 (09/22/16 0844)  Complications: No apparent anesthesia complications

## 2016-09-22 NOTE — Anesthesia Postprocedure Evaluation (Signed)
Anesthesia Post Note  Patient: Bridget Stevens  Procedure(s) Performed: Procedure(s) (LRB): CARPAL TUNNEL RELEASE, left (Left) RELEASE DORSAL COMPARTMENT (DEQUERVAIN) (Left)  Patient location during evaluation: PACU Anesthesia Type: General Level of consciousness: sedated and patient cooperative Pain management: pain level controlled Vital Signs Assessment: post-procedure vital signs reviewed and stable Respiratory status: spontaneous breathing Cardiovascular status: stable Anesthetic complications: no    Last Vitals:  Vitals:   09/22/16 1045 09/22/16 1132  BP: 113/76 130/77  Pulse: 69 (!) 50  Resp: 13 16  Temp:  36.5 C    Last Pain:  Vitals:   09/22/16 1132  TempSrc:   PainSc: 0-No pain                 Lewie LoronJohn Lyncoln Maskell

## 2016-09-22 NOTE — Anesthesia Procedure Notes (Signed)
Procedure Name: LMA Insertion Date/Time: 09/22/2016 9:20 AM Performed by: Burna CashONRAD, Tavi Hoogendoorn C Pre-anesthesia Checklist: Patient identified, Emergency Drugs available, Suction available and Patient being monitored Patient Re-evaluated:Patient Re-evaluated prior to inductionOxygen Delivery Method: Circle system utilized Preoxygenation: Pre-oxygenation with 100% oxygen Intubation Type: IV induction Ventilation: Mask ventilation without difficulty LMA: LMA inserted LMA Size: 4.0 Number of attempts: 1 Airway Equipment and Method: Bite block Placement Confirmation: positive ETCO2 Tube secured with: Tape Dental Injury: Teeth and Oropharynx as per pre-operative assessment

## 2016-09-22 NOTE — H&P (Signed)
Vaughan SineCarolyn S Kuehne is an 45 y.o. female.   Chief Complaint: Left wrist dorsal radial pain and swelling as well as numbness and tingling in the median distribution chronic in nature. HPI: Patient is a very pleasant 45 year old female status post right carpal tunnel release and right wrist first dorsal compartment tenosynovectomy with excellent results now requesting left-sided surgery person or symptoms on the contralateral left side.  Past Medical History:  Diagnosis Date  . Anxiety   . Asthma   . GERD (gastroesophageal reflux disease)   . Hepatomegaly   . Hx of migraines   . Hyperlipidemia   . Thyroid disease     Past Surgical History:  Procedure Laterality Date  . BTL  07/30/2008  . CARPAL TUNNEL RELEASE Right   . CESAREAN SECTION    . HYSTEROSCOPY W/ ENDOMETRIAL ABLATION    . TUBAL LIGATION    . WISDOM TOOTH EXTRACTION      Family History  Problem Relation Age of Onset  . Diabetes Father   . Hypertension Father   . Hyperlipidemia Father   . Thyroid disease Father   . Asthma Father   . Cancer Mother   . Hypertension Mother   . Thyroid disease Mother   . Cancer Sister     CERVICAL  . Thyroid disease Sister   . Asthma Sister   . Asthma Brother    Social History:  reports that she has never smoked. She has never used smokeless tobacco. She reports that she drinks alcohol. She reports that she does not use drugs.  Allergies:  Allergies  Allergen Reactions  . Shellfish Allergy Anaphylaxis  . Peanut-Containing Drug Products Swelling  . Percocet [Oxycodone-Acetaminophen] Other (See Comments)    PT STATES THAT SHE HAS HEADACHES WITH THIS MED  . Sulfa Antibiotics Hives    Medications Prior to Admission  Medication Sig Dispense Refill  . albuterol (PROVENTIL HFA;VENTOLIN HFA) 108 (90 Base) MCG/ACT inhaler Inhale into the lungs every 6 (six) hours as needed for wheezing or shortness of breath.    . Butalbital-Acetaminophen 50-300 MG TABS Take by mouth.    Marland Kitchen.  HYDROcodone-acetaminophen (NORCO/VICODIN) 5-325 MG tablet Take 1 tablet by mouth every 6 (six) hours as needed for moderate pain.    Marland Kitchen. ibuprofen (ADVIL,MOTRIN) 800 MG tablet Take 1 tablet (800 mg total) by mouth every 8 (eight) hours as needed for mild pain or moderate pain. 15 tablet 0  . tiZANidine (ZANAFLEX) 2 MG tablet Take by mouth every 6 (six) hours as needed for muscle spasms.    . traMADol (ULTRAM) 50 MG tablet Take 1 tablet (50 mg total) by mouth every 6 (six) hours as needed. 15 tablet 0  . omeprazole (PRILOSEC) 20 MG capsule Take 20 mg by mouth daily.      No results found for this or any previous visit (from the past 48 hour(s)). No results found.  Review of Systems  All other systems reviewed and are negative.   Blood pressure 123/72, pulse 75, temperature 98 F (36.7 C), temperature source Oral, resp. rate 16, height 5' (1.524 m), weight 89.4 kg (197 lb), SpO2 99 %. Physical Exam  Constitutional: She is oriented to person, place, and time. She appears well-developed and well-nourished.  HENT:  Head: Normocephalic and atraumatic.  Neck: Normal range of motion.  Cardiovascular: Normal rate.   Respiratory: Effort normal.  Musculoskeletal:  Patient has pain and swelling over the left wrist first dorsal compartment consistent with tenosynovitis. Patient has a positive median nerve  compression test at 35 seconds on the left. Patient also has a positive Phalen's and Tinel's on the left.  Neurological: She is alert and oriented to person, place, and time.  Skin: Skin is warm.  Psychiatric: She has a normal mood and affect. Her behavior is normal. Judgment and thought content normal.     Assessment/Plan As above. Plan left carpal tunnel release and left wrist first dorsal compartment tenosynovectomy as an outpatient.  Marlowe ShoresWEINGOLD,Sania Noy A, MD 09/22/2016, 8:56 AM

## 2016-09-22 NOTE — Anesthesia Preprocedure Evaluation (Signed)
Anesthesia Evaluation  Patient identified by MRN, date of birth, ID band Patient awake    Reviewed: Allergy & Precautions, H&P , NPO status , Patient's Chart, lab work & pertinent test results, reviewed documented beta blocker date and time   History of Anesthesia Complications Negative for: history of anesthetic complications  Airway Mallampati: I  TM Distance: >3 FB Neck ROM: full    Dental  (+) Teeth Intact   Pulmonary asthma (hasn't had an inhaler in years) ,    Pulmonary exam normal breath sounds clear to auscultation       Cardiovascular Exercise Tolerance: Good  Rhythm:regular Rate:Normal  High cholesterol   Neuro/Psych  Headaches (occasional migraines, last 1-2 months ago), PSYCHIATRIC DISORDERS (anxiety - on xanax and prozac.  Reports lots of psychosocial stress lately)    GI/Hepatic Neg liver ROS, GERD (prilosec daily)  Medicated and Controlled,  Endo/Other  Morbid obesity  Renal/GU negative Renal ROS     Musculoskeletal   Abdominal   Peds  Hematology negative hematology ROS (+)   Anesthesia Other Findings Complains of CP and SOB - relates to stress, but does occur with activity.  Will obtain EKG.  Reproductive/Obstetrics negative OB ROS                             Anesthesia Physical  Anesthesia Plan  ASA: III  Anesthesia Plan: General   Post-op Pain Management:    Induction: Intravenous  Airway Management Planned: LMA  Additional Equipment:   Intra-op Plan:   Post-operative Plan: Extubation in OR  Informed Consent: I have reviewed the patients History and Physical, chart, labs and discussed the procedure including the risks, benefits and alternatives for the proposed anesthesia with the patient or authorized representative who has indicated his/her understanding and acceptance.   Dental advisory given  Plan Discussed with: CRNA  Anesthesia Plan Comments:          Anesthesia Quick Evaluation

## 2016-09-22 NOTE — Discharge Instructions (Signed)

## 2016-09-23 ENCOUNTER — Encounter (HOSPITAL_BASED_OUTPATIENT_CLINIC_OR_DEPARTMENT_OTHER): Payer: Self-pay | Admitting: Orthopedic Surgery

## 2016-09-23 NOTE — Op Note (Signed)
[name], Bridget               ACCOUNT NO.:  [id number]  MEDICAL RECORD NO.:  [id number]  LOCATION:                                 FACILITY:  PHYSICIAN:  Artist PaisMatthew A. 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  PROCEDURE:  Left carpal tunnel release and left wrist first dorsal compartment release.  SURGEON:  Artist PaisMatthew A. Mina MarbleWeingold, M.D.  ASSISTANT:  None.  ANESTHESIA:  General.  TOURNIQUET TIME:  18 minutes.  COMPLICATIONS:  None.  DRAINS:  None.  DESCRIPTION OF PROCEDURE:  The patient was taken to the operating suite. After induction of adequate general anesthetic, left upper extremity was prepped and draped in sterile fashion.  An Esmarch was used to exsanguinate the limb.  Tourniquet was then inflated to 250 mmHg.  At this point in time, an incision was made in the palmar aspect of left hand in line with long metacarpals starting at [facility]. The skin was incised sharply 2 cm.  Dissection was carried down through the skin and subcutaneous tissues.  The palmar fascia was identified and split.  The distal edge of the transverse carpal ligament was identified and split with a 15 blade.  The median nerve was identified, protected with a Therapist, nutritionalreer elevator.  Remaining aspects of the transverse carpal ligament were then divided under direct vision using curved blunt scissors.  The canal was inspected.  There were no osseous lesions or ganglions present.  It was irrigated and loosely closed with a 3-0 Prolene subcuticular stitch.  A second incision was made over the dorsal radial aspect of the left wrist centered over the radial styloid.   Skin was incised 3-4 cm.  Dissection was carried down the first dorsal compartment.  We carefully identified and retracted the superficial radial nerve dorsally, the cephalic vein volarly.  We incised the first dorsal compartment as dorsal most extent, freeing up the EPB and APL tendons.  These were lysed of all adhesions.  There was a separate EPB sheath. This wound was thoroughly irrigated and loosely closed with a 3-0 Prolene subcuticular stitch.  Steri-Strips, 4x4s, fluffs, and a radial gutter splint was applied.  The patient tolerated both these procedures well and went to recovery room in stable fashion.     Artist PaisMatthew A. Mina MarbleWeingold, M.D.   ______________________________ Artist PaisMatthew A. Mina MarbleWeingold, M.D.    MAW/MEDQ  D:  09/22/2016  T:  09/23/2016  Job:  [id number]

## 2017-05-23 ENCOUNTER — Other Ambulatory Visit: Payer: Self-pay | Admitting: Internal Medicine

## 2017-05-23 DIAGNOSIS — Z1231 Encounter for screening mammogram for malignant neoplasm of breast: Secondary | ICD-10-CM

## 2017-06-27 ENCOUNTER — Ambulatory Visit
Admission: RE | Admit: 2017-06-27 | Discharge: 2017-06-27 | Disposition: A | Discharge status: Home / Self Care | Payer: Medicaid Other | Source: Ambulatory Visit | Attending: Internal Medicine | Admitting: Internal Medicine

## 2017-06-27 DIAGNOSIS — Z1231 Encounter for screening mammogram for malignant neoplasm of breast: Secondary | ICD-10-CM

## 2018-02-13 ENCOUNTER — Other Ambulatory Visit: Payer: Self-pay

## 2018-02-13 ENCOUNTER — Ambulatory Visit: Payer: Medicaid Other | Attending: Internal Medicine | Admitting: Physical Therapy

## 2018-02-13 ENCOUNTER — Encounter: Payer: Self-pay | Admitting: Physical Therapy

## 2018-02-13 DIAGNOSIS — G8929 Other chronic pain: Secondary | ICD-10-CM | POA: Insufficient documentation

## 2018-02-13 DIAGNOSIS — M6281 Muscle weakness (generalized): Secondary | ICD-10-CM | POA: Diagnosis present

## 2018-02-13 DIAGNOSIS — M5442 Lumbago with sciatica, left side: Secondary | ICD-10-CM | POA: Insufficient documentation

## 2018-02-13 DIAGNOSIS — R262 Difficulty in walking, not elsewhere classified: Secondary | ICD-10-CM | POA: Diagnosis present

## 2018-02-13 DIAGNOSIS — M5441 Lumbago with sciatica, right side: Secondary | ICD-10-CM | POA: Insufficient documentation

## 2018-02-13 NOTE — Therapy (Addendum)
[facility] [address] [place], [place], [address] Phone: [phone]   Fax:  [phone]  Physical Therapy Evaluation  Patient Details  Name: Bridget Stevens MRN: [id number] Date of Birth: [birth year] Referring Provider: Arrie Eastern, MD   Encounter Date: 02/13/2018  PT End of Session - 02/13/18 0856    Visit Number  1    Number of Visits  4    Date for PT Re-Evaluation  03/13/18    Authorization Type  Medicaid     PT Start Time  0805    PT Stop Time  0845    PT Time Calculation (min)  40 min    Activity Tolerance  Patient tolerated treatment well    Behavior During Therapy  [facility] for tasks assessed/performed       Past Medical History:  Diagnosis Date  . Anxiety   . Asthma   . GERD (gastroesophageal reflux disease)   . Hepatomegaly   . Hx of migraines   . Hyperlipidemia   . Thyroid disease     Past Surgical History:  Procedure Laterality Date  . BTL  07/30/2008  . CARPAL TUNNEL RELEASE Right   . CARPAL TUNNEL RELEASE Left 09/22/2016   Procedure: CARPAL TUNNEL RELEASE, left;  Surgeon: Dairl Ponder, MD;  Location: [facility];  Service: Orthopedics;  Laterality: Left;  . CESAREAN SECTION    . DORSAL COMPARTMENT RELEASE Left 09/22/2016   Procedure: RELEASE DORSAL COMPARTMENT (DEQUERVAIN);  Surgeon: Dairl Ponder, MD;  Location: [facility];  Service: Orthopedics;  Laterality: Left;  . HYSTEROSCOPY W/ ENDOMETRIAL ABLATION    . TUBAL LIGATION    . WISDOM TOOTH EXTRACTION      There were no vitals filed for this visit.   Subjective Assessment - 02/13/18 0808    Subjective  Patient is experiencing pain starting in her lower back and going down bilarterally to her ankles. Patient reports that the pain started at the end of March. The pain initally came off and on but she began to notice is more consistently after walking around at a flea market one weekend.    Limitations   Walking;Sitting;Standing    How long can you sit comfortably?  ; starts to rotate in chair     How long can you stand comfortably?  <46mins; fixing supper     How long can you walk comfortably?  mail box and back     Patient Stated Goals  particpiating in activites with her daughter (ball); walking on treadmill at the gym; undressing w/o daughters help      Currently in Pain?  Yes    Pain Score  9     Pain Location  Back    Pain Orientation  Lower    Pain Descriptors / Indicators  Aching;Shooting    Pain Type  Chronic pain    Pain Radiating Towards  down to feet     Pain Onset  More than a month ago    Aggravating Factors   crossing legs; raising leg up; stairs; bending forward     Pain Relieving Factors  hot bath     Effect of Pain on Daily Activities  Cant reach down to undress; difficulty cooking supper when standing in one spot; difficulty with functional daily activites          [facility] PT Assessment - 02/13/18 0001      Assessment   Medical Diagnosis  LBP w/ Bilateral Sciatica  Referring Provider  Arrie Eastern, MD    Onset Date/Surgical Date  -- March 2019    Hand Dominance  Right    Next MD Visit  -- Next month     Prior Therapy  yes      Precautions   Precautions  None      Restrictions   Weight Bearing Restrictions  No      Balance Screen   Has the patient fallen in the past 6 months  No    Has the patient had a decrease in activity level because of a fear of falling?   No    Is the patient reluctant to leave their home because of a fear of falling?   No      Home Public house manager residence    Living Arrangements  Spouse/significant other;Children    Type of Home  House    Home Access  Stairs to enter;Ramped entrance    Home Layout  Two level    Additional Comments  Pain increase when steping up       Prior Function   Level of Independence  Independent with basic ADLs    Vocation  Unemployed      Cognition   Overall  Cognitive Status  Within Functional Limits for tasks assessed    Attention  Focused    Focused Attention  Appears intact    Memory  Appears intact    Awareness  Appears intact    Problem Solving  Appears intact      Observation/Other Assessments   Observations  Shifts weight in chair every few mins       Sensation   Light Touch  Appears Intact    Stereognosis  Appears Intact    Hot/Cold  Appears Intact    Proprioception  Appears Intact      Coordination   Gross Motor Movements are Fluid and Coordinated  Yes    Fine Motor Movements are Fluid and Coordinated  Yes      Posture/Postural Control   Posture/Postural Control  No significant limitations    Posture Comments  Patient sits with lower back arched       AROM   AROM Assessment Site  Lumbar    Lumbar Flexion  15    Lumbar Extension  6     Lumbar - Right Side Bend  Pain with right side bending     Lumbar - Left Side Bend  Pain with left side bending; equal to the left     Lumbar - Right Rotation  limited 50 % with pain     Lumbar - Left Rotation  limited 50% with pajn       PROM   Overall PROM Comments  to painfull for internal external rotation stretching     Left Hip Flexion  40 with increased pain       Strength   Strength Assessment Site  Hip;Knee    Right/Left Hip  Right;Left    Right Hip Flexion  3/5    Right Hip ABduction  4-/5    Right Hip ADduction  4-/5    Left Hip Flexion  3/5    Left Hip ABduction  4-/5    Left Hip ADduction  4-/5    Right/Left Knee  Right;Left    Right Knee Flexion  3+/5    Right Knee Extension  3+/5    Left Knee Flexion  3+/5    Left Knee Extension  3+/5      Right Hip   Right Hip Flexion  30 with increased pain       Palpation   Palpation comment  increased muscle spasming in lumbar paraspinasl and upper gluteals; hypomobile and painful at L2-S1      Special Tests   Other special tests  SLR (+) bilateral at 20 degrees       Ambulation/Gait   Gait Comments  Decreased hip  flexion; Decreased knee flexion; Decreased L ankle dorsiflexion; decreased upper body movement                 Objective measurements completed on examination: See above findings.      [facility] Adult PT Treatment/Exercise - 02/13/18 0001      Lumbar Exercises: Stretches   Lower Trunk Rotation Limitations  x10    Other Lumbar Stretch Exercise  Standing STM with tennis ball x3 min cuin gfor pressure and placement       Lumbar Exercises: Supine   AB Set Limitations  x5 with moderat cuing for breathing     Pelvic Tilt Limitations  x5 moderate cuing              PT Education - 02/13/18 0855    Education provided  Yes    Education Details  HEP; improtance of decreasing trigger points and sensativity in the lumbar spine    Person(s) Educated  Patient    Methods  Explanation;Demonstration;Tactile cues;Verbal cues    Comprehension  Verbalized understanding;Returned demonstration;Verbal cues required;Tactile cues required       PT Short Term Goals - 02/13/18 1029      PT SHORT TERM GOAL #1   Title  Patient will display bilaterally gross hip strength to 4/5.     Baseline  Hip flexion: 3/5; Hip Abduction/Adduction: 4-/5    Time  3    Period  Weeks    Status  New    Target Date  03/06/18      PT SHORT TERM GOAL #2   Title  Patient will display bilateral passive hip flexion to 70 degrees without an increase in pain.     Baseline  Right: 30 degrees/Left: 40 degrees    Time  3    Period  Weeks    Status  New    Target Date  03/06/18      PT SHORT TERM GOAL #3   Title  Patient will display increased lumbar mobility by 25%.     Baseline  Lumbar mobility limited by pain in all planes     Time  3    Period  Weeks    Status  New    Target Date  03/06/18      PT SHORT TERM GOAL #4   Title  Patient will report decreased bilateral radiating leg pain < 5/10    Baseline  Bilateral pain 9/10 radiating down to ankles    Time  3    Period  Weeks    Status  New    Target  Date  03/06/18        PT Long Term Goals - 02/13/18 1024      PT LONG TERM GOAL #1   Title  Patient will ascend and descend 8 stairs with no reported increase of radiating pain down the leg in order to go up and down from basement to do laundry.     Baseline  Reported increase in pain with stairs    Time  6    Period  Weeks    Status  New    Target Date  03/27/18      PT LONG TERM GOAL #2   Title  Patient will bend forward with pain reported <2/10 in order to independently undress herself.     Baseline  Paitent currently needs assistance when undressing     Time  6    Period  Weeks    Status  New    Target Date  03/27/18      PT LONG TERM GOAL #3   Title  Patient will walk 67ft in order to walk to her mail box and back.     Baseline  Patient reports increase in pain after ambulating <21ft    Time  6    Period  Weeks    Status  New    Target Date  03/27/18      PT LONG TERM GOAL #4   Title  Patient will increase bilateral hip mobility to 90 degrees in order to functionally sit without an increase in pain.     Baseline  Right: 30 degrees; Left: 40 degrees    Time  6    Period  Weeks    Status  New    Target Date  03/27/18             Plan - 02/13/18 1009    Clinical Impression Statement  Patient is a 47 year old female reporting to physical therapy with lower back pain that radiates bilateral to her ankles. Patient presents with decreased active lumbar range of motion in all planes, decreased passive hip range of motion limited by pain, and decreased hip strength limited by pain and mobility. Patient displays a positive SLR bilaterally, increased muscles spasming in her lower lumbar region and upper gluteals, and decreased lumbosacral spinal mobility. Patient is extremly sensitive to palpation to the lumbosacral region. Patient displayed difficulty walking and sitting due to pain. Patient will benefit from skilled physical therapy in order to increase lumbar and hip  range of motion, increase lumbosacral mobility, increase hip strength, decrease muscle spasming, and decrease pain in order to return to daily functional activities.     Clinical Presentation  Evolving    Clinical Decision Making  Moderate    Rehab Potential  Good    PT Frequency  1x / week    PT Duration  6 weeks    PT Treatment/Interventions  ADLs/Self Care Home Management;Ultrasound;Cryotherapy;Electrical Stimulation;Moist Heat;Traction;Gait training;Stair training;Functional mobility training;Therapeutic activities;Therapeutic exercise;Balance training;Neuromuscular re-education;Patient/family education;Manual techniques;Passive range of motion;Dry needling;Taping    PT Next Visit Plan  Soft tissue mobilization; graded lumbosacral PA mobilizations; Piriformis; gluteal stretch; Core stability exercises     PT Home Exercise Plan  TrA breathing; Pelvic tilt; Lower trunk rotations; Tennis ball for desensitization     Consulted and Agree with Plan of Care  Patient       Patient will benefit from skilled therapeutic intervention in order to improve the following deficits and impairments:  Abnormal gait, Hypomobility, Pain, Decreased strength, Decreased activity tolerance, Decreased mobility, Difficulty walking, Increased muscle spasms, Decreased range of motion  Visit Diagnosis: Chronic bilateral low back pain with bilateral sciatica  Muscle weakness (generalized)  Difficulty in walking, not elsewhere classified     Problem List Patient Active Problem List   Diagnosis Date Noted  . S/P endometrial ablation - 04/2012 05/29/2012   Lorayne Bender PT DPT  02/13/2018  Elisabeth Pigeon SPT  02/13/2018, 12:42 PM  During this treatment session, the therapist was present, participating in and directing the treatment.  [facility] [address] [place], [place], [address] Phone: [phone]   Fax:  [phone]  Name: Bridget Stevens MRN:  [id number] Date of Birth: December 03, 1970

## 2018-02-23 ENCOUNTER — Encounter

## 2018-02-28 ENCOUNTER — Ambulatory Visit: Payer: Medicaid Other | Attending: Internal Medicine | Admitting: Physical Therapy

## 2018-02-28 DIAGNOSIS — M5441 Lumbago with sciatica, right side: Secondary | ICD-10-CM | POA: Insufficient documentation

## 2018-02-28 DIAGNOSIS — M5442 Lumbago with sciatica, left side: Secondary | ICD-10-CM | POA: Insufficient documentation

## 2018-02-28 DIAGNOSIS — M6281 Muscle weakness (generalized): Secondary | ICD-10-CM

## 2018-02-28 DIAGNOSIS — G8929 Other chronic pain: Secondary | ICD-10-CM | POA: Insufficient documentation

## 2018-02-28 DIAGNOSIS — R262 Difficulty in walking, not elsewhere classified: Secondary | ICD-10-CM

## 2018-02-28 NOTE — Therapy (Signed)
[place], [place], [address] Phone: [phone]   Fax:  [phone]  Physical Therapy Treatment  Patient Details  Name: Bridget Stevens MRN: [id number] Date of Birth: [birth year] Referring Provider: Nolene Ebbs, MD   Encounter Date: 02/28/2018  PT End of Session - 02/28/18 1550    Visit Number  2    Number of Visits  4    Date for PT Re-Evaluation  03/13/18    Authorization Type  Medicaid     PT Start Time  0931    PT Stop Time  1011    PT Time Calculation (min)  40 min    Activity Tolerance  Patient tolerated treatment well    Behavior During Therapy  [facility] for tasks assessed/performed       Past Medical History:  Diagnosis Date  . Anxiety   . Asthma   . GERD (gastroesophageal reflux disease)   . Hepatomegaly   . Hx of migraines   . Hyperlipidemia   . Thyroid disease     Past Surgical History:  Procedure Laterality Date  . BTL  07/30/2008  . CARPAL TUNNEL RELEASE Right   . CARPAL TUNNEL RELEASE Left 09/22/2016   Procedure: CARPAL TUNNEL RELEASE, left;  Surgeon: Charlotte Crumb, MD;  Location: [place];  Service: Orthopedics;  Laterality: Left;  . CESAREAN SECTION    . DORSAL COMPARTMENT RELEASE Left 09/22/2016   Procedure: RELEASE DORSAL COMPARTMENT (DEQUERVAIN);  Surgeon: Charlotte Crumb, MD;  Location: [place];  Service: Orthopedics;  Laterality: Left;  . HYSTEROSCOPY W/ ENDOMETRIAL ABLATION    . TUBAL LIGATION    . WISDOM TOOTH EXTRACTION      There were no vitals filed for this visit.  Subjective Assessment - 02/28/18 0949    Subjective  Patient reports her back is no better. She is having 10/10 pain . She states she was unable to do her exercises becuase she could not get into a good position. Most of her exercises invlovled sitting in a chair. Therapy will review.     Limitations  Walking;Sitting;Standing    How long can you sit comfortably?   33mns; starts to rotate in chair     How long can you stand comfortably?  <574ms; fixing supper     How long can you walk comfortably?  mail box and back     Patient Stated Goals  particpiating in activites with her daughter (ball); walking on treadmill at the gym; undressing w/o daughters help      Currently in Pain?  Yes    Pain Score  10-Worst pain ever Patient reports it is a 10/10 but she does not want to go to the emergency room.     Pain Orientation  Right;Left;Lower    Pain Descriptors / Indicators  Aching;Shooting    Pain Type  Chronic pain    Pain Onset  More than a month ago    Pain Frequency  Constant    Aggravating Factors   any movement     Pain Relieving Factors  hot bath     Effect of Pain on Daily Activities  cant do any daily activity at this time                        [facility] - [facility] 0001      Lumbar Exercises: Seated   Other Seated Lumbar Exercises  scap retraction 2x10  Lumbar Exercises: Supine   AB Set Limitations  2x5 moderate cuing     Pelvic Tilt Limitations  despite cuing the patient still had difficulty      Manual Therapy   Manual therapy comments  attempted LAD. Patient reported pain on the left beofre therapy even appled a pull.              PT Education - 02/28/18 1549    Education provided  Yes    Education Details  HEP; improtance of moving; reviewed how to do HEP    Person(s) Educated  Patient    Methods  Explanation;Demonstration;Tactile cues;Verbal cues    Comprehension  Need further instruction;Verbalized understanding;Returned demonstration;Verbal cues required;Tactile cues required       PT Short Term Goals - 02/13/18 1029      PT SHORT TERM GOAL #1   Title  Patient will display bilaterally gross hip strength to 4/5.     Baseline  Hip flexion: 3/5; Hip Abduction/Adduction: 4-/5    Time  3    Period  Weeks    Status  New    Target Date  03/06/18      PT SHORT TERM GOAL #2    Title  Patient will display bilateral passive hip flexion to 70 degrees without an increase in pain.     Baseline  Right: 30 degrees/Left: 40 degrees    Time  3    Period  Weeks    Status  New    Target Date  03/06/18      PT SHORT TERM GOAL #3   Title  Patient will display increased lumbar mobility by 25%.     Baseline  Lumbar mobility limited by pain in all planes     Time  3    Period  Weeks    Status  New    Target Date  03/06/18      PT SHORT TERM GOAL #4   Title  Patient will report decreased bilateral radiating leg pain < 5/10    Baseline  Bilateral pain 9/10 radiating down to ankles    Time  3    Period  Weeks    Status  New    Target Date  03/06/18        PT Long Term Goals - 02/13/18 1024      PT LONG TERM GOAL #1   Title  Patient will ascend and descend 8 stairs with no reported increase of radiating pain down the leg in order to go up and down from basement to do laundry.     Baseline  Reported increase in pain with stairs    Time  6    Period  Weeks    Status  New    Target Date  03/27/18      PT LONG TERM GOAL #2   Title  Patient will bend forward with pain reported <2/10 in order to independently undress herself.     Baseline  Paitent currently needs assistance when undressing     Time  6    Period  Weeks    Status  New    Target Date  03/27/18      PT LONG TERM GOAL #3   Title  Patient will walk 631f in order to walk to her mail box and back.     Baseline  Patient reports increase in pain after ambulating <477f   Time  6    Period  Weeks  Status  New    Target Date  03/27/18      PT LONG TERM GOAL #4   Title  Patient will increase bilateral hip mobility to 90 degrees in order to functionally sit without an increase in pain.     Baseline  Right: 30 degrees; Left: 40 degrees    Time  6    Period  Weeks    Status  New    Target Date  03/27/18            Plan - 02/28/18 0945    Clinical Impression Statement  Therapy attempted light  LAD. With minimal pull on the leg the patient reported severe pain. She had difficulty with all basic movements. The patient is very reluctant to do any activity. Therapy explained several times how important it was that she be more aware of her posture and start to work on her posturla exercises. She has 2 more visits scheudled. If she is not more willing to try exercises, baisc movements, manual therapy, or modalities next visit consdier D/C. She delined heat and Stim. No goals met.     Clinical Presentation  Evolving    Clinical Decision Making  Moderate    PT Frequency  2x / week    PT Duration  8 weeks    PT Treatment/Interventions  ADLs/Self Care Home Management;Ultrasound;Cryotherapy;Electrical Stimulation;Moist Heat;Traction;Gait training;Stair training;Functional mobility training;Therapeutic activities;Therapeutic exercise;Balance training;Neuromuscular re-education;Patient/family education;Manual techniques;Passive range of motion;Dry needling;Taping    PT Next Visit Plan  Soft tissue mobilization; graded lumbosacral PA mobilizations; Piriformis; gluteal stretch; Core stability exercises     PT Home Exercise Plan  TrA breathing; Pelvic tilt; Lower trunk rotations; Tennis ball for desensitization     Consulted and Agree with Plan of Care  Patient       Patient will benefit from skilled therapeutic intervention in order to improve the following deficits and impairments:  Abnormal gait, Hypomobility, Pain, Decreased strength, Decreased activity tolerance, Decreased mobility, Difficulty walking, Increased muscle spasms, Decreased range of motion  Visit Diagnosis: Chronic bilateral low back pain with bilateral sciatica  Muscle weakness (generalized)  Difficulty in walking, not elsewhere classified     Problem List Patient Active Problem List   Diagnosis Date Noted  . S/P endometrial ablation - 04/2012 05/29/2012    Carney Living PT DPT  02/28/2018, 3:57 PM  [facility] [address] [place], [place], [address] Phone: [phone]   Fax:  [phone]  Name: Bridget Stevens MRN: [id number] Date of Birth: 07-Apr-1971

## 2018-03-06 ENCOUNTER — Encounter: Payer: Self-pay | Admitting: Physical Therapy

## 2018-03-06 ENCOUNTER — Ambulatory Visit: Payer: Medicaid Other | Admitting: Physical Therapy

## 2018-03-06 DIAGNOSIS — M5442 Lumbago with sciatica, left side: Secondary | ICD-10-CM | POA: Diagnosis not present

## 2018-03-06 DIAGNOSIS — R262 Difficulty in walking, not elsewhere classified: Secondary | ICD-10-CM

## 2018-03-06 DIAGNOSIS — G8929 Other chronic pain: Secondary | ICD-10-CM

## 2018-03-06 DIAGNOSIS — M6281 Muscle weakness (generalized): Secondary | ICD-10-CM

## 2018-03-06 DIAGNOSIS — M5441 Lumbago with sciatica, right side: Principal | ICD-10-CM

## 2018-03-06 NOTE — Therapy (Signed)
[facility] [address] [place], [place], [address] Phone: [phone]   Fax:  [phone]  Physical Therapy Treatment  Patient Details  Name: Bridget Stevens MRN: [id number] Date of Birth: 1971-04-01 Referring Provider: Fleet Contras, MD   Encounter Date: 03/06/2018  PT End of Session - 03/06/18 1129    Visit Number  3    Number of Visits  4    Date for PT Re-Evaluation  03/13/18    PT Start Time  0805    PT Stop Time  0845    PT Time Calculation (min)  40 min    Activity Tolerance  Patient tolerated treatment well;Patient limited by pain       Past Medical History:  Diagnosis Date  . Anxiety   . Asthma   . GERD (gastroesophageal reflux disease)   . Hepatomegaly   . Hx of migraines   . Hyperlipidemia   . Thyroid disease     Past Surgical History:  Procedure Laterality Date  . BTL  07/30/2008  . CARPAL TUNNEL RELEASE Right   . CARPAL TUNNEL RELEASE Left 09/22/2016   Procedure: CARPAL TUNNEL RELEASE, left;  Surgeon: Dairl Ponder, MD;  Location: [facility];  Service: Orthopedics;  Laterality: Left;  . CESAREAN SECTION    . DORSAL COMPARTMENT RELEASE Left 09/22/2016   Procedure: RELEASE DORSAL COMPARTMENT (DEQUERVAIN);  Surgeon: Dairl Ponder, MD;  Location: [facility];  Service: Orthopedics;  Laterality: Left;  . HYSTEROSCOPY W/ ENDOMETRIAL ABLATION    . TUBAL LIGATION    . WISDOM TOOTH EXTRACTION      There were no vitals filed for this visit.  Subjective Assessment - 03/06/18 0812    Subjective  Pain is not better yet.  I walking a lot yesterday.  I have to sleep in the recliner.      Currently in Pain?  Yes    Pain Location  Back    Pain Orientation  Right;Left;Lower    Pain Descriptors / Indicators  Aching;Shooting;Constant    Pain Radiating Towards  to feet    Pain Frequency  Constant    Aggravating Factors   longer walking.  always the same    Pain Relieving Factors   sleeping in the recliner lately,  hot tub  eases a little,  pain meds make me sleep     Effect of Pain on Daily Activities  limits ADL.  needs to use a rail to get up steps when carrying laundry.      Multiple Pain Sites  -- knees  9/10. Neck shoulders 9/10                        [facility] Adult PT Treatment/Exercise - 03/06/18 0001      Self-Care   Self-Care  ADL's    ADL's  work stratigies discussed. laundry techniques ,  starting with the evening meal in the morning and space work out during day in short sessions.  Avoid pain spreading from hips down leg instead of continuing irritating activity,  etc.      Lumbar Exercises: Stretches   Passive Hamstring Stretch  -- attempted,  guarding,  not tolerated.    Lower Trunk Rotation Limitations  10 cues for mild stretch    Pelvic Tilt  -- 3 reps painful.    Piriformis Stretch  3 reps;30 seconds gentle,   passive.      Lumbar Exercises: Supine   Ab  Set  5 reps single.  attempted with 5 breaths in a row sustained difficu    Clam  5 reps painful    Bent Knee Raise  -- 7 reps right 2 sets      Manual Therapy   Manual therapy comments  prone light soft tissue work, jumpy, multiple tension areas noted in low back and gluteals,  tissue softened and was less reactive.              PT Education - 03/06/18 1129    Education provided  Yes    Education Details  self care    Person(s) Educated  Patient    Methods  Explanation;Verbal cues    Comprehension  Verbalized understanding       PT Short Term Goals - 02/13/18 1029      PT SHORT TERM GOAL #1   Title  Patient will display bilaterally gross hip strength to 4/5.     Baseline  Hip flexion: 3/5; Hip Abduction/Adduction: 4-/5    Time  3    Period  Weeks    Status  New    Target Date  03/06/18      PT SHORT TERM GOAL #2   Title  Patient will display bilateral passive hip flexion to 70 degrees without an increase in pain.     Baseline  Right: 30 degrees/Left: 40  degrees    Time  3    Period  Weeks    Status  New    Target Date  03/06/18      PT SHORT TERM GOAL #3   Title  Patient will display increased lumbar mobility by 25%.     Baseline  Lumbar mobility limited by pain in all planes     Time  3    Period  Weeks    Status  New    Target Date  03/06/18      PT SHORT TERM GOAL #4   Title  Patient will report decreased bilateral radiating leg pain < 5/10    Baseline  Bilateral pain 9/10 radiating down to ankles    Time  3    Period  Weeks    Status  New    Target Date  03/06/18        PT Long Term Goals - 02/13/18 1024      PT LONG TERM GOAL #1   Title  Patient will ascend and descend 8 stairs with no reported increase of radiating pain down the leg in order to go up and down from basement to do laundry.     Baseline  Reported increase in pain with stairs    Time  6    Period  Weeks    Status  New    Target Date  03/27/18      PT LONG TERM GOAL #2   Title  Patient will bend forward with pain reported <2/10 in order to independently undress herself.     Baseline  Paitent currently needs assistance when undressing     Time  6    Period  Weeks    Status  New    Target Date  03/27/18      PT LONG TERM GOAL #3   Title  Patient will walk 62ft in order to walk to her mail box and back.     Baseline  Patient reports increase in pain after ambulating <22ft    Time  6    Period  Weeks    Status  New    Target Date  03/27/18      PT LONG TERM GOAL #4   Title  Patient will increase bilateral hip mobility to 90 degrees in order to functionally sit without an increase in pain.     Baseline  Right: 30 degrees; Left: 40 degrees    Time  6    Period  Weeks    Status  New    Target Date  03/27/18            Plan - 03/06/18 1130    Clinical Impression Statement  Patient was able to do a few basic exercises today.  She was able to peform abdominal bracing with a single breath.  She was able to brace for more than one breath in a  row with difficulty.  She tolerated prone position today for manual. Right now most movements / exercise are painful.  emptional support given.  Support at home limited.     PT Next Visit Plan  Soft tissue mobilization; graded lumbosacral PA mobilizations; Piriformis; gluteal stretch; Core stability exercises .  Consider nu step with small ranges    PT Home Exercise Plan  TrA breathing; Pelvic tilt; Lower trunk rotations; Tennis ball for desensitization     Consulted and Agree with Plan of Care  Patient       Patient will benefit from skilled therapeutic intervention in order to improve the following deficits and impairments:     Visit Diagnosis: Chronic bilateral low back pain with bilateral sciatica  Muscle weakness (generalized)  Difficulty in walking, not elsewhere classified     Problem List Patient Active Problem List   Diagnosis Date Noted  . S/P endometrial ablation - 04/2012 05/29/2012    Jawann Urbani  PTA 03/06/2018, 11:35 AM  [facility] [address] [place], [place], [address] Phone: [phone]   Fax:  [phone]  Name: Bridget Stevens MRN: [id number] Date of Birth: Jun 29, 1971

## 2018-03-15 ENCOUNTER — Ambulatory Visit: Payer: Medicaid Other | Admitting: Physical Therapy

## 2018-03-15 ENCOUNTER — Encounter: Payer: Self-pay | Admitting: Physical Therapy

## 2018-03-15 DIAGNOSIS — M5441 Lumbago with sciatica, right side: Principal | ICD-10-CM

## 2018-03-15 DIAGNOSIS — G8929 Other chronic pain: Secondary | ICD-10-CM

## 2018-03-15 DIAGNOSIS — R262 Difficulty in walking, not elsewhere classified: Secondary | ICD-10-CM

## 2018-03-15 DIAGNOSIS — M6281 Muscle weakness (generalized): Secondary | ICD-10-CM

## 2018-03-15 DIAGNOSIS — M5442 Lumbago with sciatica, left side: Principal | ICD-10-CM

## 2018-03-15 NOTE — Therapy (Signed)
[place], [place], [address] Phone: [phone]   Fax:  [phone]  Physical Therapy Treatment/Discharge   Patient Details  Name: Bridget Stevens MRN: [id number] Date of Birth: 04/15/1971 Referring Provider: Nolene Ebbs, MD   Encounter Date: 03/15/2018  PT End of Session - 03/15/18 0818    Visit Number  4    Number of Visits  4    Date for PT Re-Evaluation  03/13/18    Authorization Type  Medicaid     PT Start Time  0804    PT Stop Time  0842    PT Time Calculation (min)  38 min    Activity Tolerance  Patient tolerated treatment well;Patient limited by pain    Behavior During Therapy  [facility] for tasks assessed/performed       Past Medical History:  Diagnosis Date  . Anxiety   . Asthma   . GERD (gastroesophageal reflux disease)   . Hepatomegaly   . Hx of migraines   . Hyperlipidemia   . Thyroid disease     Past Surgical History:  Procedure Laterality Date  . BTL  07/30/2008  . CARPAL TUNNEL RELEASE Right   . CARPAL TUNNEL RELEASE Left 09/22/2016   Procedure: CARPAL TUNNEL RELEASE, left;  Surgeon: Charlotte Crumb, MD;  Location: [place];  Service: Orthopedics;  Laterality: Left;  . CESAREAN SECTION    . DORSAL COMPARTMENT RELEASE Left 09/22/2016   Procedure: RELEASE DORSAL COMPARTMENT (DEQUERVAIN);  Surgeon: Charlotte Crumb, MD;  Location: [place];  Service: Orthopedics;  Laterality: Left;  . HYSTEROSCOPY W/ ENDOMETRIAL ABLATION    . TUBAL LIGATION    . WISDOM TOOTH EXTRACTION      There were no vitals filed for this visit.  Subjective Assessment - 03/15/18 0810    Subjective  Patient reports she is still no better. She does seem to be doing more activity but she states the pain is still worse. She reports she has not been able to do alot of the things that have been given to her.     Limitations  Walking;Sitting;Standing    How long can you sit  comfortably?  16mns; starts to rotate in chair     How long can you stand comfortably?  <583ms; fixing supper     How long can you walk comfortably?  mail box and back     Patient Stated Goals  particpiating in activites with her daughter (ball); walking on treadmill at the gym; undressing w/o daughters help      Currently in Pain?  Yes    Pain Score  9     Pain Location  Back    Pain Orientation  Right;Left    Pain Descriptors / Indicators  Aching;Shooting    Pain Type  Chronic pain    Pain Onset  More than a month ago    Pain Frequency  Constant    Aggravating Factors   longer she walks     Pain Relieving Factors  Sleeping in a recliner     Effect of Pain on Daily Activities  limited in ADL's          [facility] 03/15[facility]18 [facility]      AROM   Lumbar Flexion  20     Lumbar Extension  6     Lumbar - Right Rotation  50% limited with pain     Lumbar - Left Rotation  50% limited  with pain       PROM   Left Hip Flexion  65      Strength   Right Hip Flexion  3+/5    Right Hip ABduction  4/5    Right Hip ADduction  4-/5    Left Hip Flexion  3+/5    Left Hip ABduction  4/5    Left Hip ADduction  4-/5    Right Knee Flexion  3+/5    Right Knee Extension  3+/5    Left Knee Flexion  3+/5    Left Knee Extension  3+/5      Right Hip   Right Hip Flexion  60      Special Tests   Other special tests  SLR (+) bilateral at 20 degrees                    [facility] Adult PT Treatment/Exercise - 03/15/18 0001      Self-Care   ADL's  reviewed the improtance of moving and progressing activity       Lumbar Exercises: Stretches   Passive Hamstring Stretch  3 reps;30 seconds    Single Knee to Chest Stretch  3 reps;20 seconds    Lower Trunk Rotation Limitations  10      Lumbar Exercises: Supine   AB Set Limitations  2x5 moderate cuing     Clam  Limitations    Clam Limitations  x10 red     Other Supine Lumbar Exercises  ball squeeze 2x10              PT  Education - 03/15/18 0813    Education provided  Yes    Person(s) Educated  Patient    Methods  Explanation;Demonstration;Tactile cues;Verbal cues    Comprehension  Verbalized understanding;Verbal cues required;Returned demonstration;Tactile cues required       PT Short Term Goals - 03/15/18 1253      PT SHORT TERM GOAL #1   Title  Patient will display bilaterally gross hip strength to 4/5.     Baseline  Hip flexion: 3+/5; Hip Abduction/Adduction: 4-/5    Time  3    Period  Weeks    Status  On-going      PT SHORT TERM GOAL #2   Title  Patient will display bilateral passive hip flexion to 70 degrees without an increase in pain.     Baseline  Right: 60degrees/Left: 65 degrees    Time  3    Period  Weeks    Status  Not Met      PT SHORT TERM GOAL #3   Title  Patient will display increased lumbar mobility by 25%.     Baseline  Lumbar mobility limited by pain in all planes     Time  3    Period  Weeks    Status  Not Met      PT SHORT TERM GOAL #4   Title  Patient will report decreased bilateral radiating leg pain < 5/10    Baseline  9-10/10 at all times     Time  3    Period  Weeks    Status  Not Met        PT Long Term Goals - 03/15/18 1254      PT LONG TERM GOAL #1   Title  Patient will ascend and descend 8 stairs with no reported increase of radiating pain down the leg in order to go up and down from  basement to do laundry.     Baseline  no change     Time  6    Period  Weeks    Status  Not Met      PT LONG TERM GOAL #2   Title  Patient will bend forward with pain reported <2/10 in order to independently undress herself.     Baseline  Paitent currently needs assistance when undressing     Time  6    Period  Weeks    Status  Not Met      PT LONG TERM GOAL #3   Title  Patient will walk 636f in order to walk to her mail box and back.     Baseline  Patient reports increase in pain after ambulating <437f   Time  6    Period  Weeks    Status  Not Met      PT  LONG TERM GOAL #4   Title  Patient will increase bilateral hip mobility to 90 degrees in order to functionally sit without an increase in pain.     Baseline  Right: 30 degrees; Left: 40 degrees    Time  6    Period  Weeks    Status  Achieved            Plan - 03/15/18 1250    Clinical Impression Statement  The patient has not made progress with PT. Therapy has tried to get her to do even basic exercises but she has been unable to complete even basic breathing. She reports she does other activity butthese activity's likley cause her more pain. She has shown some improvemnet in hip movement in strength. She reports her pain si still the same though. Therapy encouraged her to continue wiht her exercises. SHe will be D/C'd to HEP 2nd to lack of progress and lack of perfroming her home exercises. She has not met any goals.     Clinical Presentation  Evolving    Clinical Decision Making  Moderate    Rehab Potential  Good    PT Frequency  2x / week    PT Duration  8 weeks    PT Treatment/Interventions  ADLs/Self Care Home Management;Ultrasound;Cryotherapy;Electrical Stimulation;Moist Heat;Traction;Gait training;Stair training;Functional mobility training;Therapeutic activities;Therapeutic exercise;Balance training;Neuromuscular re-education;Patient/family education;Manual techniques;Passive range of motion;Dry needling;Taping    PT Next Visit Plan  Soft tissue mobilization; graded lumbosacral PA mobilizations; Piriformis; gluteal stretch; Core stability exercises .  Consider nu step with small ranges    PT Home Exercise Plan  TrA breathing; Pelvic tilt; Lower trunk rotations; Tennis ball for desensitization     Consulted and Agree with Plan of Care  Patient       Patient will benefit from skilled therapeutic intervention in order to improve the following deficits and impairments:  Abnormal gait, Hypomobility, Pain, Decreased strength, Decreased activity tolerance, Decreased mobility, Difficulty  walking, Increased muscle spasms, Decreased range of motion  Visit Diagnosis: Chronic bilateral low back pain with bilateral sciatica  Muscle weakness (generalized)  Difficulty in walking, not elsewhere classified  PHYSICAL THERAPY DISCHARGE SUMMARY  Visits from Start of Care: 4  Current functional level related to goals / functional outcomes: No change   Remaining deficits: Is very hesitant to perform any exercises and most movements    Education / Equipment: HEP Plan: Patient agrees to discharge.  Patient goals were not met. Patient is being discharged due to the patient's request.  ?????       Problem List  Patient Active Problem List   Diagnosis Date Noted  . S/P endometrial ablation - 04/2012 05/29/2012    Carney Living PT DPT  03/15/2018, 1:00 PM  [facility] [address] [place], [place], [address] Phone: [phone]   Fax:  [phone]  Name: MARIGENE ERLER MRN: [id number] Date of Birth: [birth year]

## 2018-05-25 ENCOUNTER — Other Ambulatory Visit: Payer: Self-pay | Admitting: Internal Medicine

## 2018-05-25 DIAGNOSIS — Z1231 Encounter for screening mammogram for malignant neoplasm of breast: Secondary | ICD-10-CM

## 2018-06-29 ENCOUNTER — Ambulatory Visit
Admission: RE | Admit: 2018-06-29 | Discharge: 2018-06-29 | Disposition: A | Discharge status: Home / Self Care | Payer: Medicaid Other | Source: Ambulatory Visit | Attending: Internal Medicine | Admitting: Internal Medicine

## 2018-06-29 DIAGNOSIS — Z1231 Encounter for screening mammogram for malignant neoplasm of breast: Secondary | ICD-10-CM

## 2018-06-29 IMAGING — MG DIGITAL SCREENING BILATERAL MAMMOGRAM WITH TOMO AND CAD
5 series · 6 of 13 positions shown · non-contrast
Comparison: Previous exam(s).

CLINICAL DATA: Screening.

EXAM:
DIGITAL SCREENING BILATERAL MAMMOGRAM WITH TOMO AND CAD

[R MLO synth-2D]
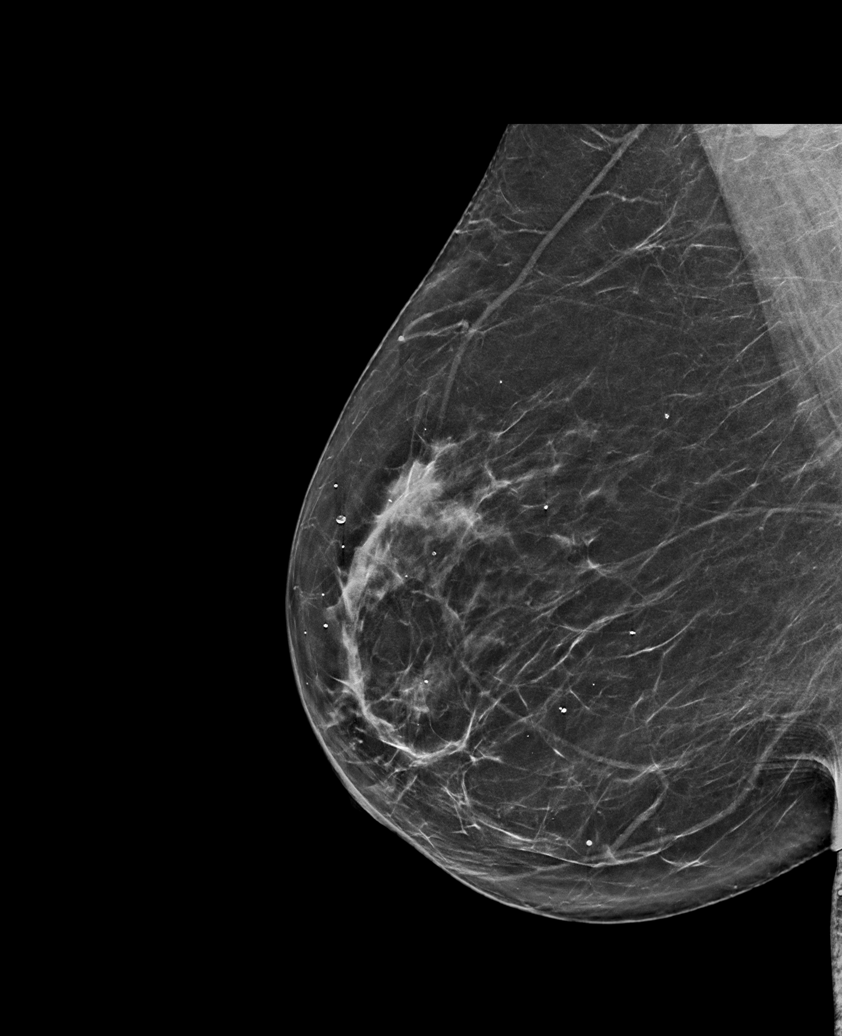

[R CC synth-2D]
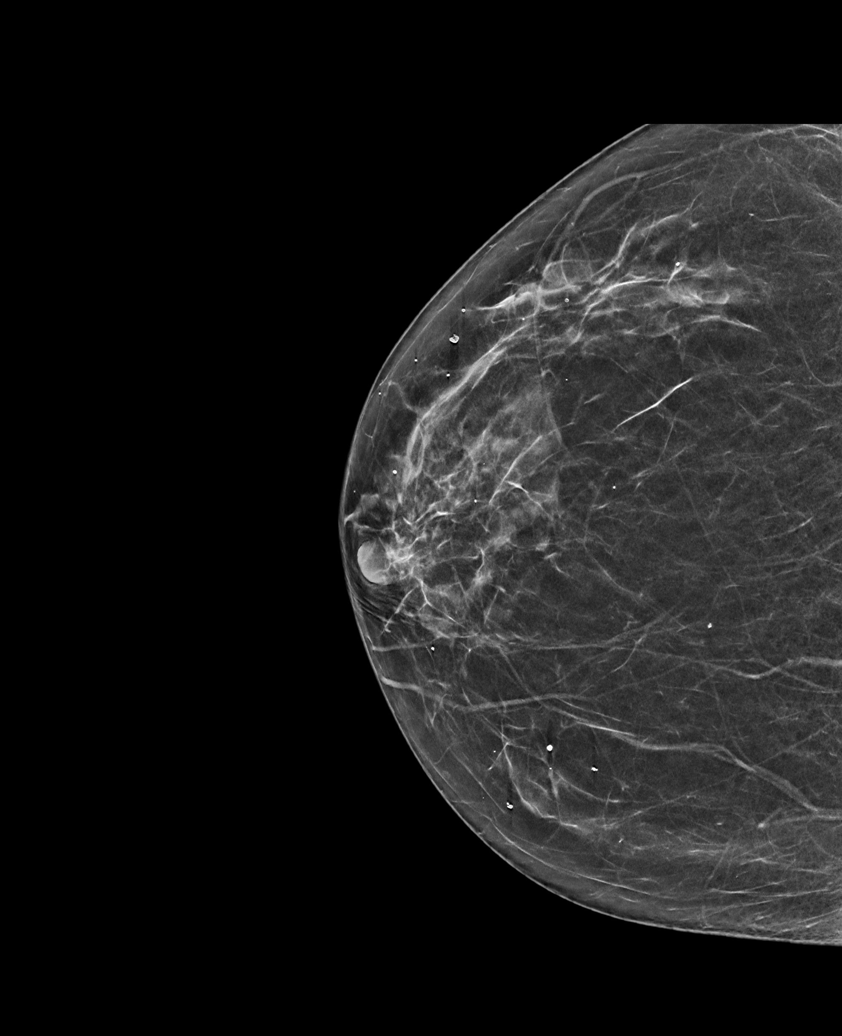

[L CC synth-2D]
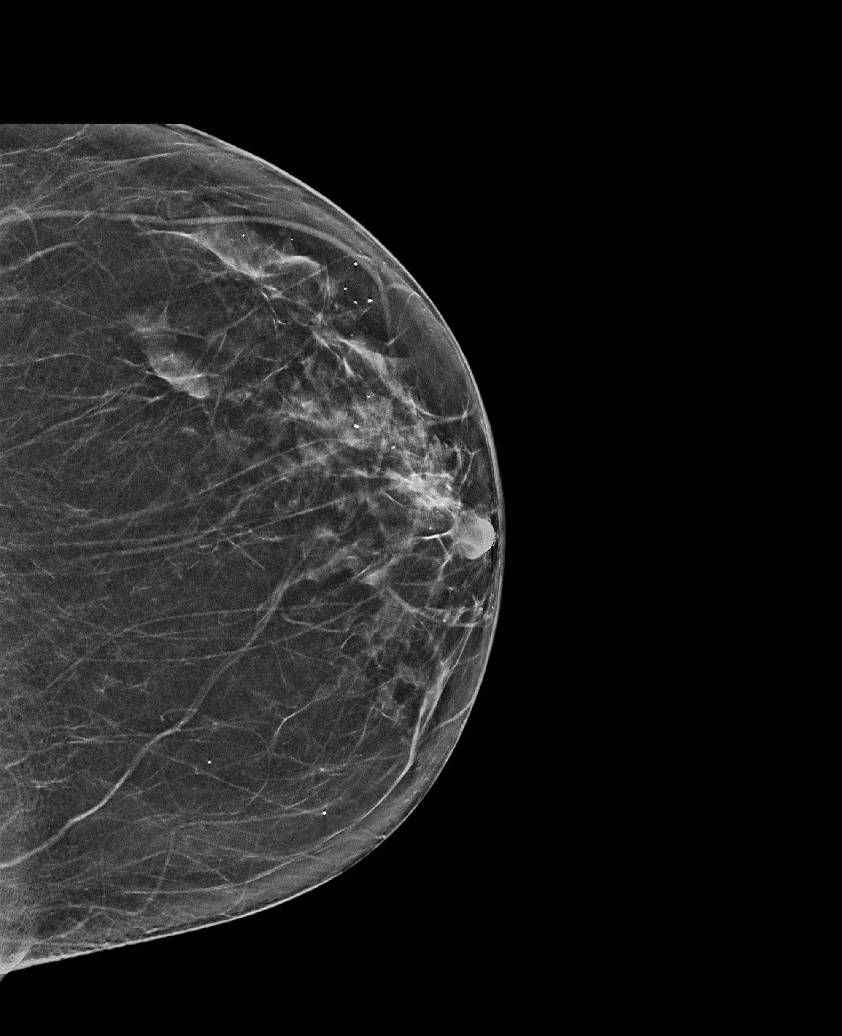

[L MLO tomo · 2 of 79 frames shown]
[frame 26/79]
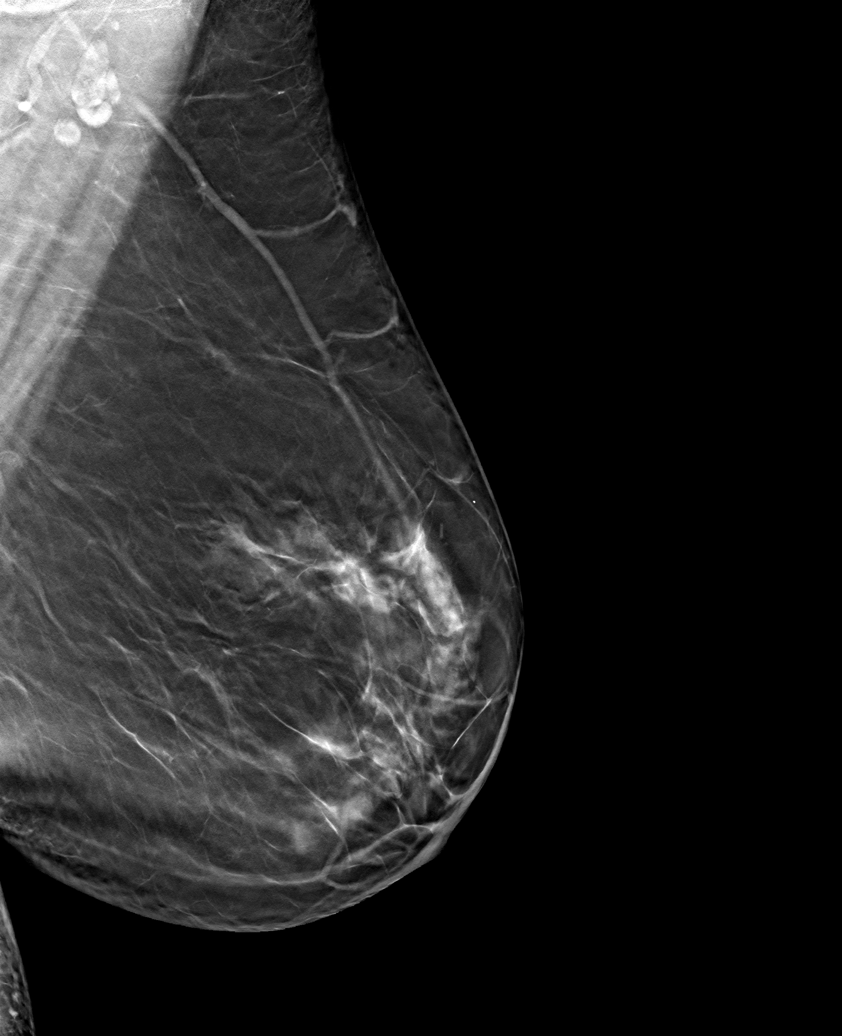
[frame 40/79]
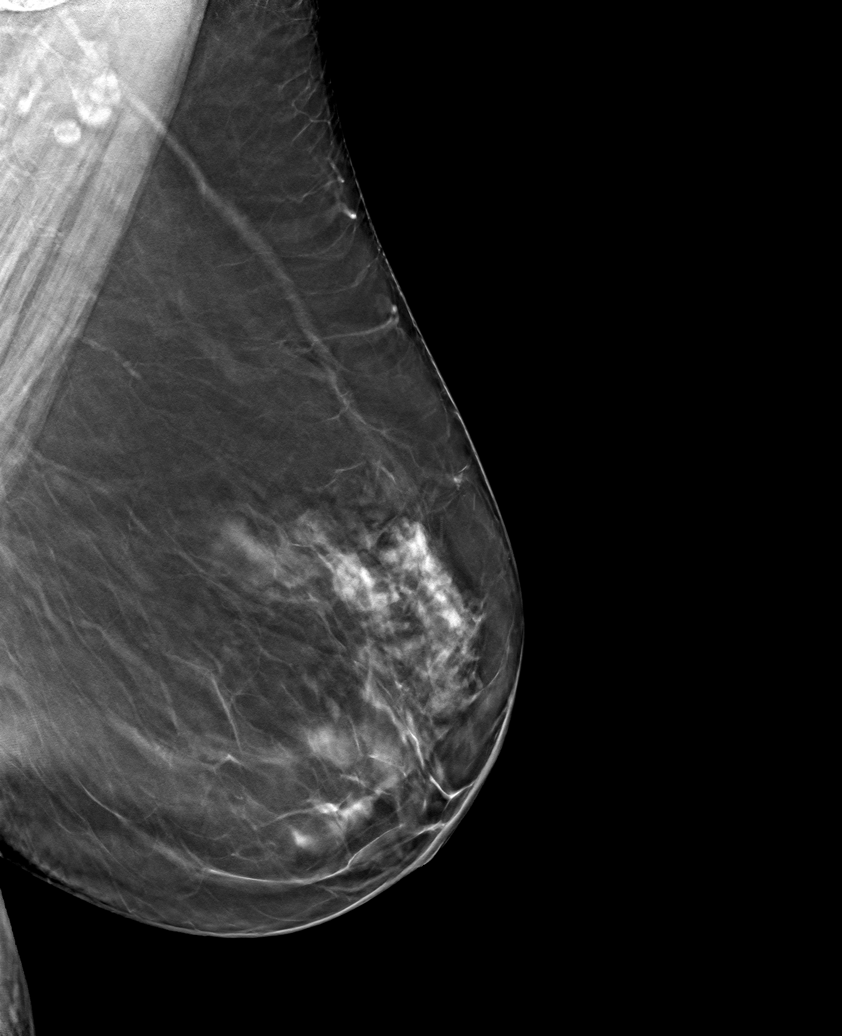

[R CC tomo · tomo slice 32/63.0]
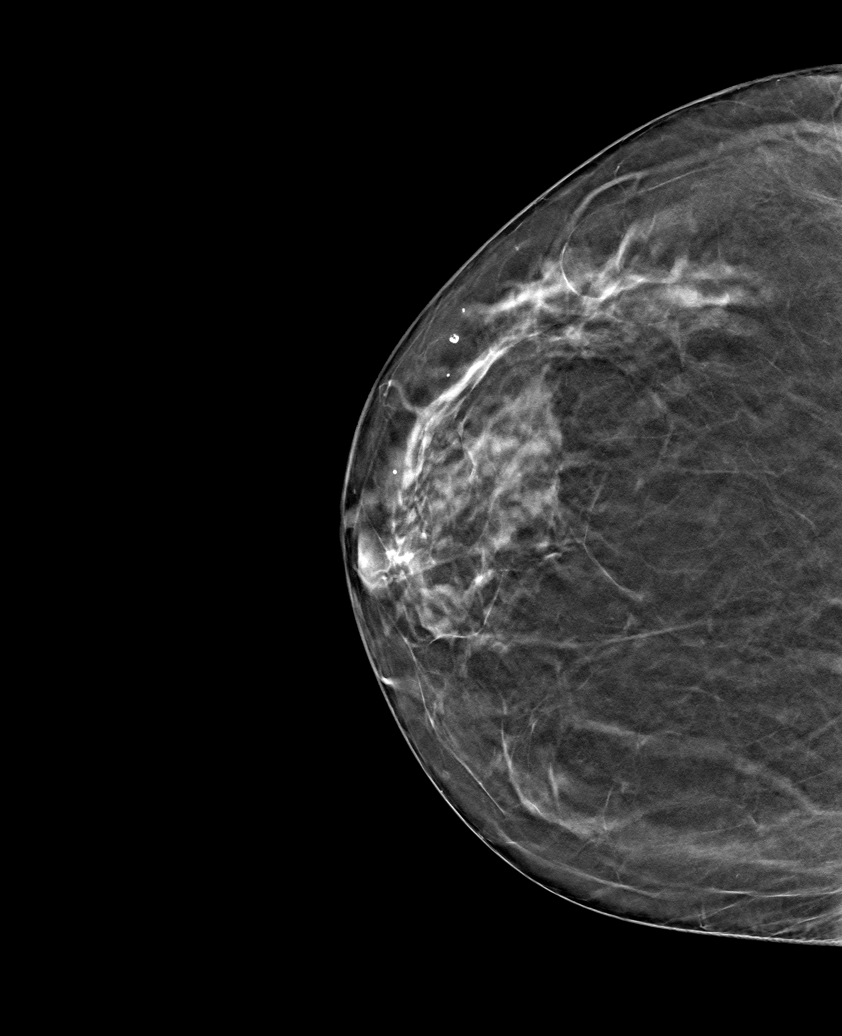

[6 of 13 positions shown; findings below may reference images not displayed]

ACR Breast Density Category b: There are scattered areas of
fibroglandular density.
FINDINGS: There are no findings suspicious for malignancy. Images were
processed with CAD.
IMPRESSION: No mammographic evidence of malignancy. A result letter of this
screening mammogram will be mailed directly to the patient.

RECOMMENDATION:
Screening mammogram in one year. (Code:[TQ])

BI-RADS CATEGORY  1: Negative.

## 2018-09-13 ENCOUNTER — Telehealth: Payer: Self-pay

## 2018-09-13 NOTE — Telephone Encounter (Signed)
Ms. Bridget Stevens has called me after searching the [facility]'s website.  She is hoping to find a way to do "therapy" that is affordable to her since the PT office that she was referred to for H2O PT doesn't accept her insurance (Medicaid).  I have informed her of the Provider Referral Exercise Program offered at the [facility] and that her costs could potentially be covered if approved by [facility].  I have emailed her more information about the program and she is able to call/email with any other questions if she decides to participate.  I have also requested that she talk to her MD about the program if she has interest in doing it.  I have told her we will be starting new classes in Jan '20 and to call in Dec. If she'd like to join us.

## 2019-04-17 ENCOUNTER — Other Ambulatory Visit: Payer: Self-pay

## 2019-04-17 DIAGNOSIS — M79641 Pain in right hand: Secondary | ICD-10-CM

## 2019-04-17 DIAGNOSIS — M79642 Pain in left hand: Secondary | ICD-10-CM

## 2019-05-17 ENCOUNTER — Other Ambulatory Visit: Payer: Self-pay

## 2019-05-17 ENCOUNTER — Ambulatory Visit (INDEPENDENT_AMBULATORY_CARE_PROVIDER_SITE_OTHER): Payer: Medicaid Other | Admitting: Neurology

## 2019-05-17 DIAGNOSIS — M79641 Pain in right hand: Secondary | ICD-10-CM

## 2019-05-17 DIAGNOSIS — M79642 Pain in left hand: Secondary | ICD-10-CM

## 2019-05-17 DIAGNOSIS — G5603 Carpal tunnel syndrome, bilateral upper limbs: Secondary | ICD-10-CM

## 2019-05-17 NOTE — Procedures (Signed)
[facility]  [place]  [place], [place] [address] Tel: [phone] Fax:  [phone] Test Date:  05/17/2019  Patient: Bridget Stevens DOB: 11-15-1970 Physician: Narda Amber, DO  Sex: Female Height: 4\' 11"  Ref Phys: Roseanne Kaufman  ID#: [id number] Temp: 33.0C Technician:    Patient Complaints: This is a 48 year-old s/p bilateral carpal tunnel release referred for evaluation of bilateral hand tingling and numbness.  NCV & EMG Findings: Extensive electrodiagnostic of the right upper extremity and additional studies of the left shows: 1. Bilateral mixed palmar sensory responses show prolonged latencies.  Bilateral median and ulnar sensory responses are within normal limits.  2. Bilateral median and ulnar motor responses are within normal limits. 3. There is no evidence of active or chronic motor axonal loss changes affecting any of the tested muscles.  Motor unit configuration and recruitment pattern is within normal limits.  Impression: 1. Very mild bilateral median neuropathy at or distal to the wrist, consistent with a clinical diagnosis of carpal tunnel syndrome.   2. There is no evidence of an ulnar neuropathy or cervical radiculopathy affecting the upper extremities.   ___________________________ Narda Amber, DO    Nerve Conduction Studies Anti Sensory Summary Table   Stim Site NR Peak (ms) Norm Peak (ms) P-T Amp (V) Norm P-T Amp  Left Median Anti Sensory (2nd Digit)  33C  Wrist    2.9 <3.6 47.7 >15  Right Median Anti Sensory (2nd Digit)  33C  Wrist    2.8 <3.6 37.0 >15  Left Ulnar Anti Sensory (5th Digit)  33C  Wrist    2.3 <3.1 46.9 >10  Right Ulnar Anti Sensory (5th Digit)  33C  Wrist    2.2 <3.1 45.4 >10   Motor Summary Table   Stim Site NR Onset (ms) Norm Onset (ms) O-P Amp (mV) Norm O-P Amp Site1 Site2 Delta-0 (ms) Dist (cm) Vel (m/s) Norm Vel (m/s)  Left Median Motor (Abd Poll Brev)  33C  Wrist    2.7 <4.0 12.4 >6 Elbow  Wrist 3.8 24.0 63 >50  Elbow    6.5  12.2         Right Median Motor (Abd Poll Brev)  33C  Wrist    3.0 <4.0 13.5 >6 Elbow Wrist 4.0 25.0 63 >50  Elbow    7.0  13.2         Left Ulnar Motor (Abd Dig Minimi)  33C  Wrist    1.9 <3.1 9.9 >7 B Elbow Wrist 2.9 19.0 66 >50  B Elbow    4.8  9.6  A Elbow B Elbow 1.5 10.0 67 >50  A Elbow    6.3  9.5         Right Ulnar Motor (Abd Dig Minimi)  33C  Wrist    2.0 <3.1 9.9 >7 B Elbow Wrist 3.1 19.0 61 >50  B Elbow    5.1  9.9  A Elbow B Elbow 1.5 10.0 67 >50  A Elbow    6.6  9.9          Comparison Summary Table   Stim Site NR Peak (ms) Norm Peak (ms) P-T Amp (V) Site1 Site2 Delta-P (ms) Norm Delta (ms)  Left Median/Ulnar Palm Comparison (Wrist - 8cm)  33C  Median Palm    1.8 <2.2 45.2 Median Palm Ulnar Palm 0.6   Ulnar Palm    1.2 <2.2 22.9      Right Median/Ulnar Palm Comparison (Wrist - 8cm)  33C  Median Palm    1.8 <2.2 74.2 Median Palm Ulnar Palm 0.5   Ulnar Palm    1.3 <2.2 25.4       EMG   Side Muscle Ins Act Fibs Psw Fasc Number Recrt Dur Dur. Amp Amp. Poly Poly. Comment  Left 1stDorInt Nml Nml Nml Nml Nml Nml Nml Nml Nml Nml Nml Nml N/A  Left Abd Poll Brev Nml Nml Nml Nml Nml Nml Nml Nml Nml Nml Nml Nml N/A  Left PronatorTeres Nml Nml Nml Nml Nml Nml Nml Nml Nml Nml Nml Nml N/A  Left Biceps Nml Nml Nml Nml Nml Nml Nml Nml Nml Nml Nml Nml N/A  Left Triceps Nml Nml Nml Nml Nml Nml Nml Nml Nml Nml Nml Nml N/A  Left Deltoid Nml Nml Nml Nml Nml Nml Nml Nml Nml Nml Nml Nml N/A  Right 1stDorInt Nml Nml Nml Nml Nml Nml Nml Nml Nml Nml Nml Nml N/A  Right Abd Poll Brev Nml Nml Nml Nml Nml Nml Nml Nml Nml Nml Nml Nml N/A  Right PronatorTeres Nml Nml Nml Nml Nml Nml Nml Nml Nml Nml Nml Nml N/A  Right Biceps Nml Nml Nml Nml Nml Nml Nml Nml Nml Nml Nml Nml N/A  Right Triceps Nml Nml Nml Nml Nml Nml Nml Nml Nml Nml Nml Nml N/A  Right Deltoid Nml Nml Nml Nml Nml Nml Nml Nml Nml Nml Nml Nml N/A      Waveforms:

## 2019-05-31 ENCOUNTER — Other Ambulatory Visit: Payer: Self-pay | Admitting: Internal Medicine

## 2019-05-31 DIAGNOSIS — Z1231 Encounter for screening mammogram for malignant neoplasm of breast: Secondary | ICD-10-CM

## 2019-06-15 ENCOUNTER — Other Ambulatory Visit: Payer: Self-pay

## 2019-06-15 ENCOUNTER — Ambulatory Visit (INDEPENDENT_AMBULATORY_CARE_PROVIDER_SITE_OTHER): Payer: Medicaid Other

## 2019-06-15 ENCOUNTER — Ambulatory Visit (HOSPITAL_COMMUNITY)
Admission: EM | Admit: 2019-06-15 | Discharge: 2019-06-15 | Disposition: A | Discharge status: Home / Self Care | Payer: Medicaid Other | Attending: Emergency Medicine | Admitting: Emergency Medicine

## 2019-06-15 DIAGNOSIS — W2209XA Striking against other stationary object, initial encounter: Secondary | ICD-10-CM | POA: Diagnosis not present

## 2019-06-15 DIAGNOSIS — S90121A Contusion of right lesser toe(s) without damage to nail, initial encounter: Secondary | ICD-10-CM | POA: Diagnosis not present

## 2019-06-15 IMAGING — DX RIGHT FOOT COMPLETE - 3+ VIEW
3 series · 3 of 3 positions shown · non-contrast
Comparison: None.

CLINICAL DATA: Bruising and swelling secondary to blunt trauma two
days ago.

EXAM:
RIGHT FOOT COMPLETE - 3+ VIEW

[foot ap]
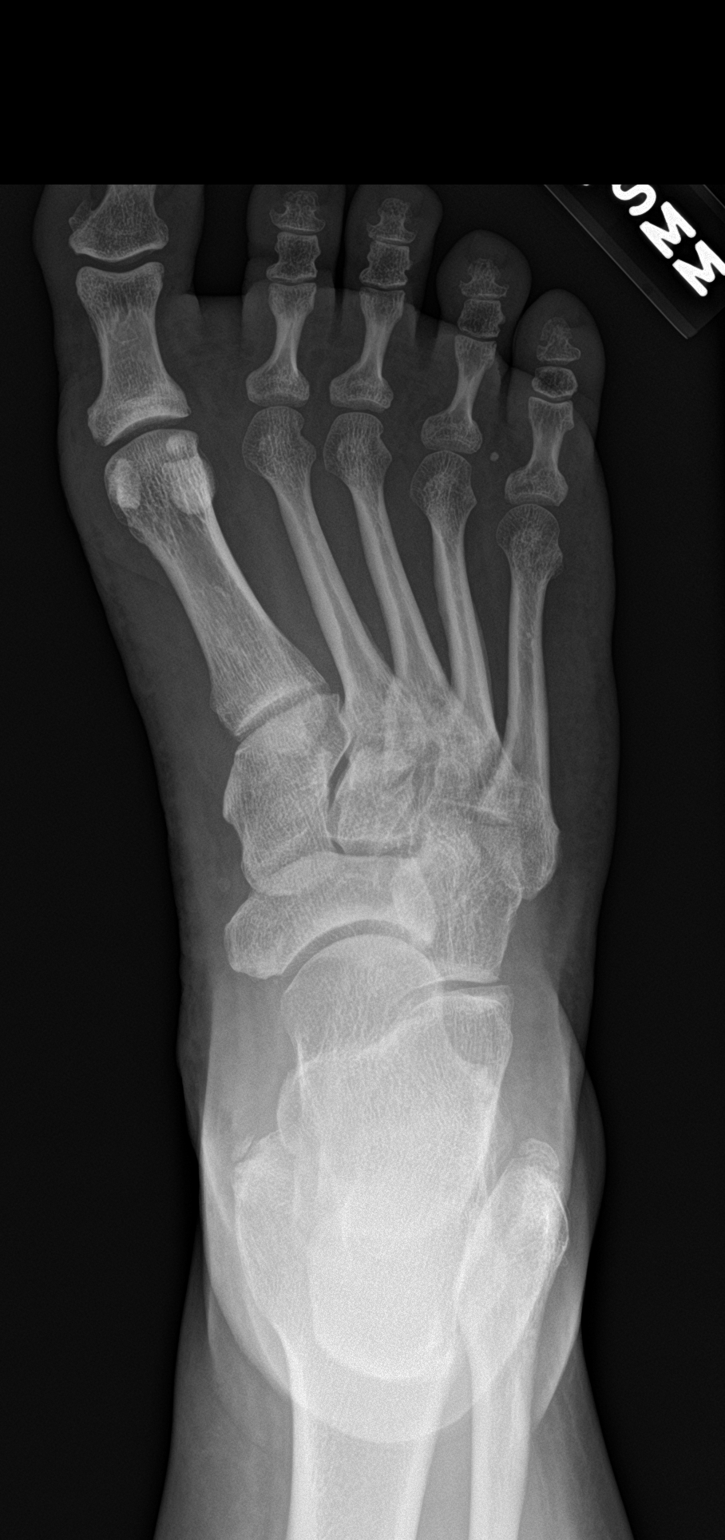

[foot obl]
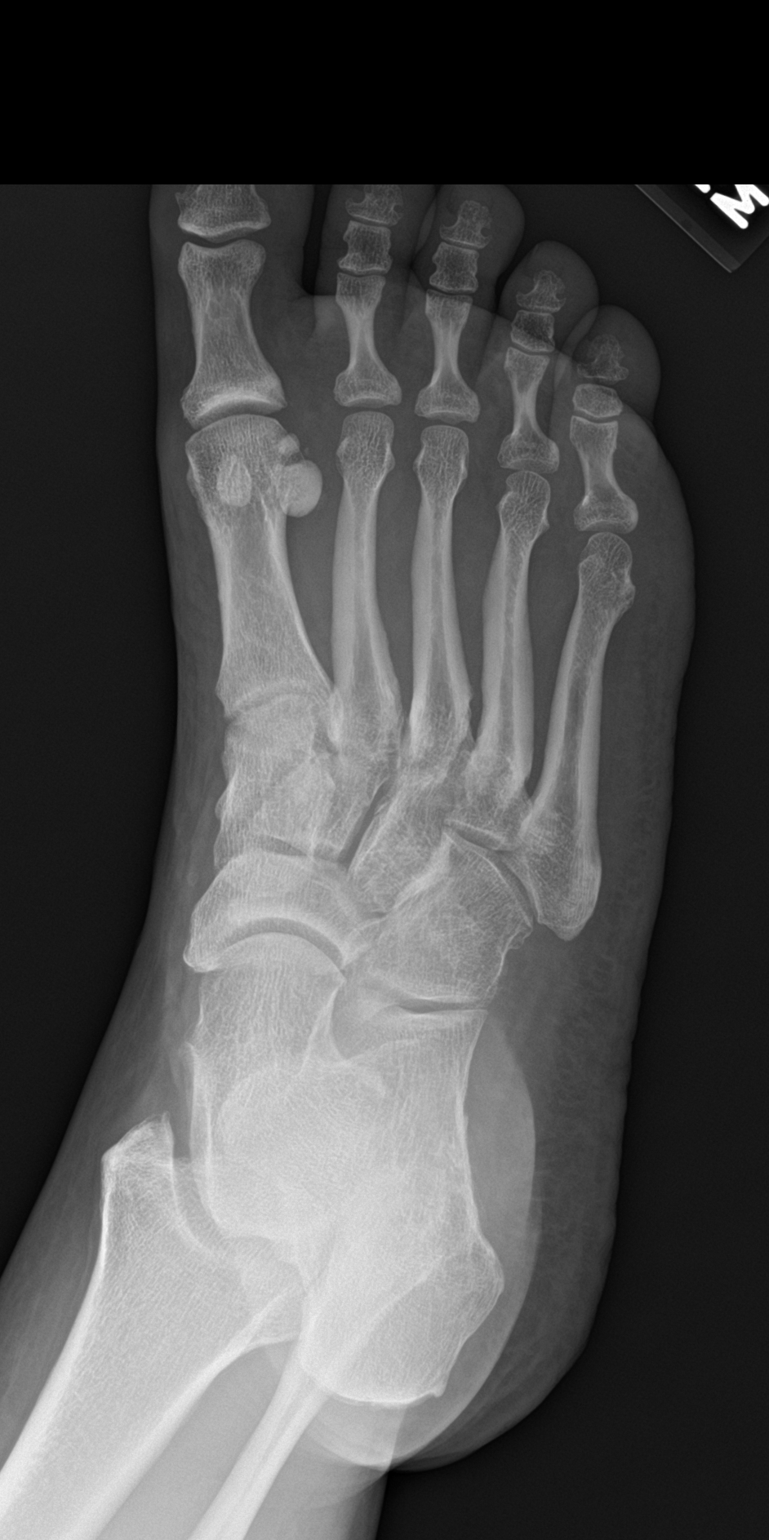

[foot lat]
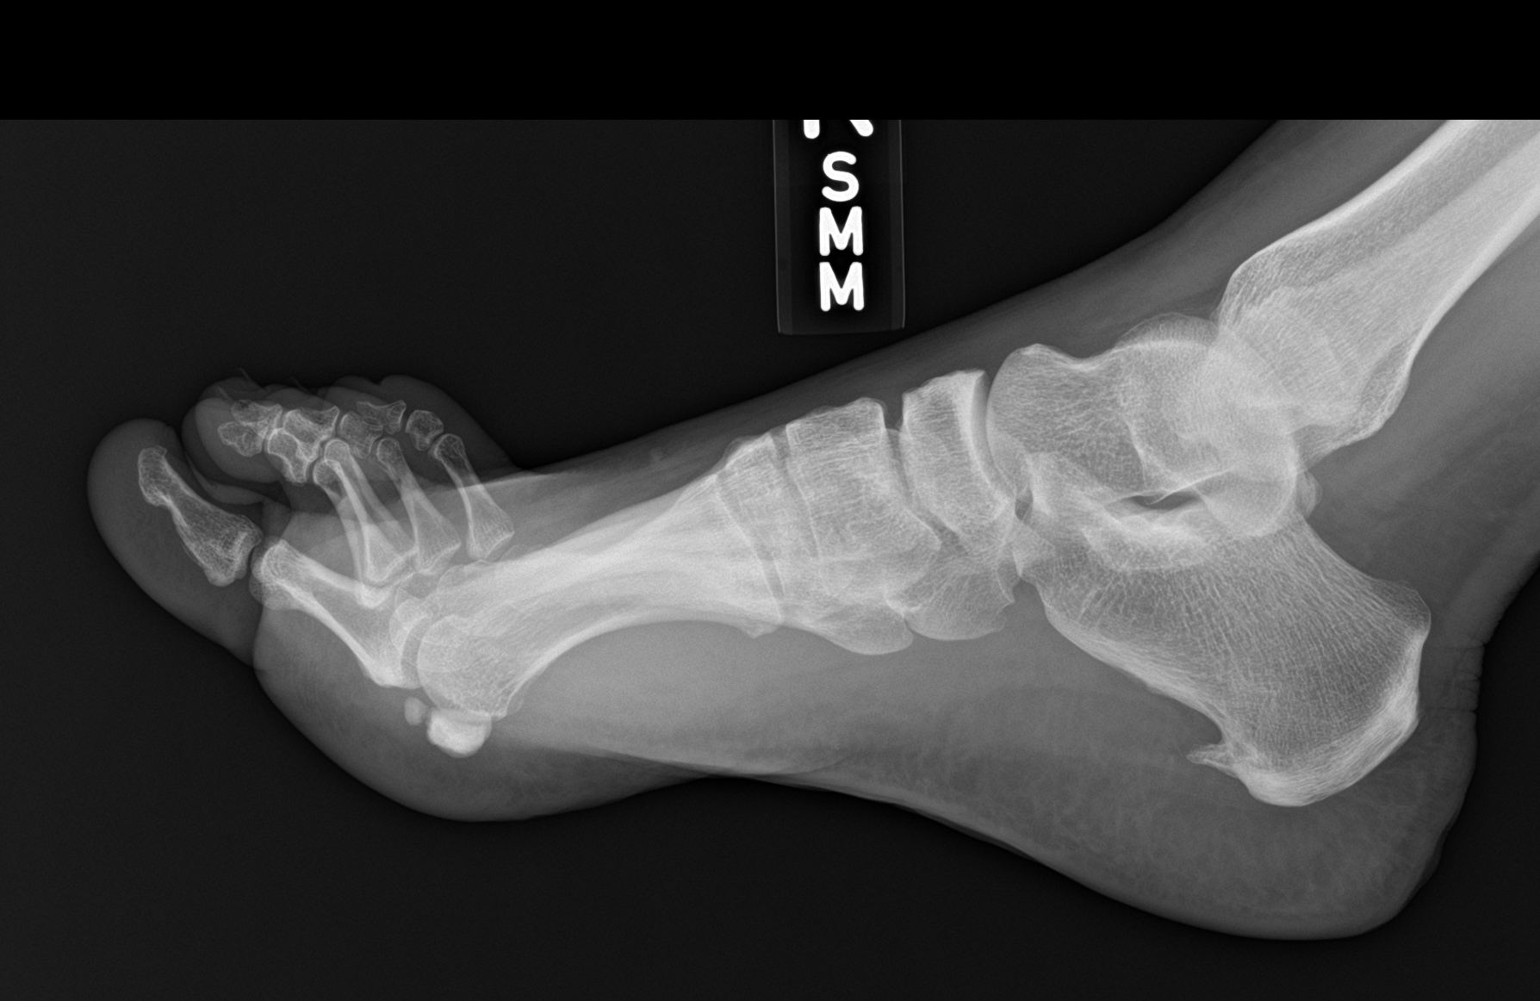

[3 of 3 positions shown; findings below may reference images not displayed]

FINDINGS: There is no evidence of fracture or dislocation. There is no
evidence of arthropathy or other significant focal bone abnormality.
Plantar calcaneal enthesophyte. Soft tissues are unremarkable.
IMPRESSION: No acute abnormalities.

## 2019-06-15 NOTE — Discharge Instructions (Addendum)
Take Tylenol or ibuprofen as needed for discomfort.  You can buddy tape your toe for comfort as needed.    Rest and elevate your foot.  You can use ice packs 2-3 times a day for up to 20 minutes.

## 2019-06-15 NOTE — ED Provider Notes (Signed)
[facility]    CSN: [id number] Arrival date & time: 06/15/19  1126      History   Chief Complaint Chief Complaint  Patient presents with  . Toe Pain    HPI Bridget Stevens is a 48 y.o. female.   Patient presents with pain, redness, and swelling in her right small toe x3 days which began after she hit it on a door frame.  She denies numbness, weakness, paresthesias.  LMP: 2013, had an ablation.    The history is provided by the patient.    Past Medical History:  Diagnosis Date  . Anxiety   . Asthma   . GERD (gastroesophageal reflux disease)   . Hepatomegaly   . Hx of migraines   . Hyperlipidemia   . Thyroid disease     Patient Active Problem List   Diagnosis Date Noted  . S/P endometrial ablation - 04/2012 05/29/2012    Past Surgical History:  Procedure Laterality Date  . BTL  07/30/2008  . CARPAL TUNNEL RELEASE Right   . CARPAL TUNNEL RELEASE Left 09/22/2016   Procedure: CARPAL TUNNEL RELEASE, left;  Surgeon: Dairl PonderMatthew Weingold, MD;  Location: [facility];  Service: Orthopedics;  Laterality: Left;  . CESAREAN SECTION    . DORSAL COMPARTMENT RELEASE Left 09/22/2016   Procedure: RELEASE DORSAL COMPARTMENT (DEQUERVAIN);  Surgeon: Dairl PonderMatthew Weingold, MD;  Location: [facility];  Service: Orthopedics;  Laterality: Left;  . HYSTEROSCOPY W/ ENDOMETRIAL ABLATION    . TUBAL LIGATION    . WISDOM TOOTH EXTRACTION      OB History    Gravida  1   Para  1   Term      Preterm      AB      Living  1     SAB      TAB      Ectopic      Multiple      Live Births               Home Medications    Prior to Admission medications   Medication Sig Start Date End Date Taking? Authorizing Provider  albuterol (PROVENTIL HFA;VENTOLIN HFA) 108 (90 Base) MCG/ACT inhaler Inhale into the lungs every 6 (six) hours as needed for wheezing or shortness of breath.    [provider]  Butalbital-Acetaminophen 50-300 MG TABS  Take by mouth.    [provider]  HYDROcodone-acetaminophen (NORCO/VICODIN) 5-325 MG tablet Take 1 tablet by mouth every 6 (six) hours as needed for moderate pain.    [provider]  hydrocodone-ibuprofen (VICOPROFEN) 5-200 MG tablet Take 1 tablet by mouth every 8 (eight) hours as needed for pain. Patient not taking: Reported on 02/13/2018 09/22/16   Dairl PonderWeingold, Matthew, MD  ibuprofen (ADVIL,MOTRIN) 800 MG tablet Take 1 tablet (800 mg total) by mouth every 8 (eight) hours as needed for mild pain or moderate pain. 09/27/14   Trixie DredgeWest, Emily, PA-C  omeprazole (PRILOSEC) 20 MG capsule Take 20 mg by mouth daily.    [provider]  phentermine 37.5 MG capsule Take 37.5 mg by mouth every morning.    [provider]  tiZANidine (ZANAFLEX) 2 MG tablet Take by mouth every 6 (six) hours as needed for muscle spasms.    [provider]  traMADol (ULTRAM) 50 MG tablet Take 1 tablet (50 mg total) by mouth every 6 (six) hours as needed. Patient not taking: Reported on 02/13/2018 07/25/16   Isa RankinMurray, Laura Wilson,  MD    Family History Family History  Problem Relation Age of Onset  . Diabetes Father   . Hypertension Father   . Hyperlipidemia Father   . Thyroid disease Father   . Asthma Father   . Cancer Mother   . Hypertension Mother   . Thyroid disease Mother   . Cancer Sister        CERVICAL  . Thyroid disease Sister   . Asthma Sister   . Asthma Brother   . Breast cancer Neg Hx     Social History Social History   Tobacco Use  . Smoking status: Never Smoker  . Smokeless tobacco: Never Used  Substance Use Topics  . Alcohol use: Yes    Comment: socially  . Drug use: No     Allergies   Shellfish allergy, Peanut-containing drug products, Percocet [oxycodone-acetaminophen], and Sulfa antibiotics   Review of Systems Review of Systems  Constitutional: Negative for chills and fever.  HENT: Negative for ear pain and sore throat.   Eyes: Negative for pain  and visual disturbance.  Respiratory: Negative for cough and shortness of breath.   Cardiovascular: Negative for chest pain and palpitations.  Gastrointestinal: Negative for abdominal pain and vomiting.  Genitourinary: Negative for dysuria and hematuria.  Musculoskeletal: Positive for arthralgias. Negative for back pain.  Skin: Negative for color change and rash.  Neurological: Negative for seizures and syncope.  All other systems reviewed and are negative.    Physical Exam Triage Vital Signs ED Triage Vitals  Enc Vitals Group     BP 06/15/19 1201 106/68     Pulse Rate 06/15/19 1201 (!) 57     Resp 06/15/19 1201 18     Temp 06/15/19 1201 98.2 F (36.8 C)     Temp Source 06/15/19 1201 Oral     SpO2 06/15/19 1201 100 %     Weight --      Height --      Head Circumference --      Peak Flow --      Pain Score 06/15/19 1202 6     Pain Loc --      Pain Edu? --      Excl. in [place]? --    No data found.  Updated Vital Signs BP 106/68 (BP Location: Left Arm)   Pulse (!) 57   Temp 98.2 F (36.8 C) (Oral)   Resp 18   SpO2 100%   Visual Acuity Right Eye Distance:   Left Eye Distance:   Bilateral Distance:    Right Eye Near:   Left Eye Near:    Bilateral Near:     Physical Exam Vitals signs and nursing note reviewed.  Constitutional:      General: She is not in acute distress.    Appearance: She is well-developed.  HENT:     Head: Normocephalic and atraumatic.  Eyes:     Conjunctiva/sclera: Conjunctivae normal.  Neck:     Musculoskeletal: Neck supple.  Cardiovascular:     Rate and Rhythm: Normal rate and regular rhythm.     Heart sounds: No murmur.  Pulmonary:     Effort: Pulmonary effort is normal. No respiratory distress.     Breath sounds: Normal breath sounds.  Abdominal:     Palpations: Abdomen is soft.     Tenderness: There is no abdominal tenderness.  Musculoskeletal:        General: Swelling and tenderness present. No deformity.     Comments: Right 5th  toe tender, edematous.  Skin:    General: Skin is warm and dry.     Findings: No bruising or lesion.  Neurological:     General: No focal deficit present.     Mental Status: She is alert and oriented to person, place, and time.     Sensory: No sensory deficit.     Motor: No weakness.      UC Treatments / Results  Labs (all labs ordered are listed, but only abnormal results are displayed) Labs Reviewed - No data to display  EKG   Radiology Dg Foot Complete Right  Result Date: 06/15/2019 CLINICAL DATA:  Bruising and swelling secondary to blunt trauma two days ago. EXAM: RIGHT FOOT COMPLETE - 3+ VIEW COMPARISON:  None. FINDINGS: There is no evidence of fracture or dislocation. There is no evidence of arthropathy or other significant focal bone abnormality. Plantar calcaneal enthesophyte. Soft tissues are unremarkable. IMPRESSION: No acute abnormalities. Electronically Signed   By: Francene Boyers M.D.   On: 06/15/2019 13:44    Procedures Procedures (including critical care time)  Medications Ordered in UC Medications - No data to display  Initial Impression / Assessment and Plan / UC Course  I have reviewed the triage vital signs and the nursing notes.  Pertinent labs & imaging results that were available during my care of the patient were reviewed by me and considered in my medical decision making (see chart for details).    Contusion of right fifth toe.  X-ray negative.  Instructed patient to take Tylenol or ibuprofen as needed for discomfort.  Discussed that she can buddy tape her toes for comfort as needed.  Also discussed that she should rest and elevate her foot and use ice packs for swelling.  Patient agrees with plan of care.     Final Clinical Impressions(s) / UC Diagnoses   Final diagnoses:  Contusion of lesser toe of right foot without damage to nail, initial encounter     Discharge Instructions     Take Tylenol or ibuprofen as needed for discomfort.  You can  buddy tape your toe for comfort as needed.    Rest and elevate your foot.  You can use ice packs 2-3 times a day for up to 20 minutes.          ED Prescriptions    None     Controlled Substance Prescriptions [place] Controlled Substance Registry consulted? Not Applicable   Mickie Bail, NP 06/15/19 1354

## 2019-06-15 NOTE — ED Triage Notes (Signed)
Pt sts right pinky toe pain after hitting 3 days ago

## 2019-07-17 ENCOUNTER — Other Ambulatory Visit: Payer: Self-pay

## 2019-07-17 ENCOUNTER — Ambulatory Visit
Admission: RE | Admit: 2019-07-17 | Discharge: 2019-07-17 | Disposition: A | Discharge status: Home / Self Care | Payer: Medicaid Other | Source: Ambulatory Visit | Attending: Internal Medicine | Admitting: Internal Medicine

## 2019-07-17 DIAGNOSIS — Z1231 Encounter for screening mammogram for malignant neoplasm of breast: Secondary | ICD-10-CM

## 2019-07-17 IMAGING — MG MM DIGITAL SCREENING BILAT W/ TOMO W/ CAD
8 series · 8 of 24 positions shown · non-contrast
Comparison: Previous exam(s).

CLINICAL DATA: Screening.

EXAM:
DIGITAL SCREENING BILATERAL MAMMOGRAM WITH TOMO AND CAD

[R CC synth-2D]
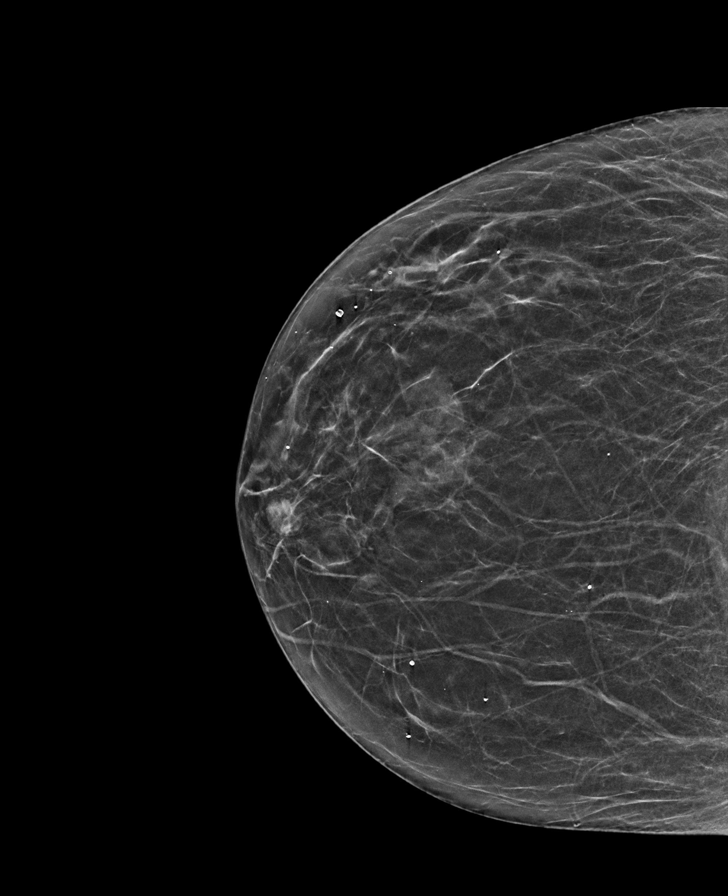

[R MLO synth-2D]
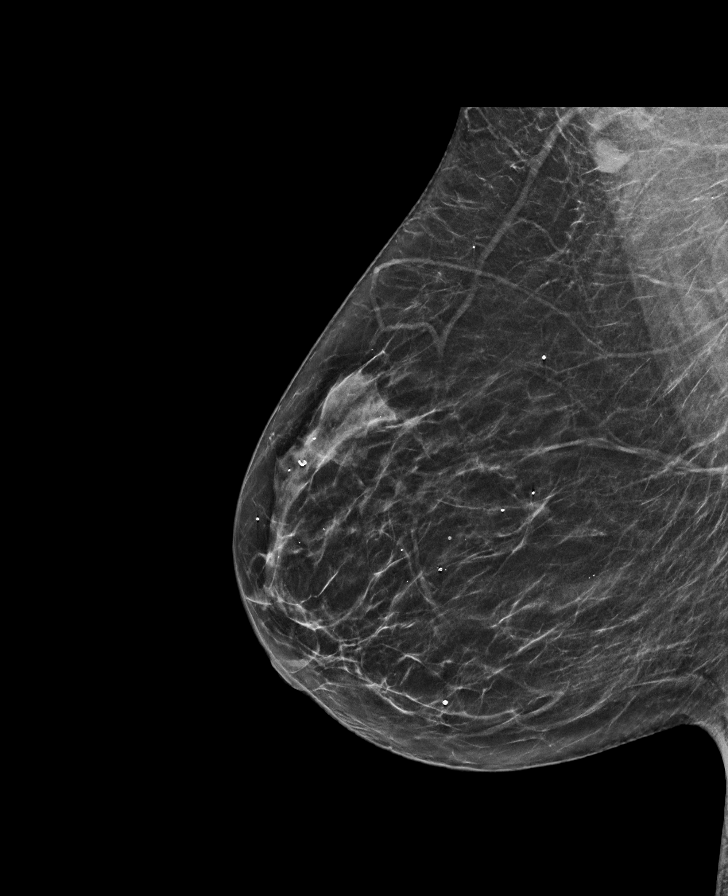

[L MLO synth-2D]
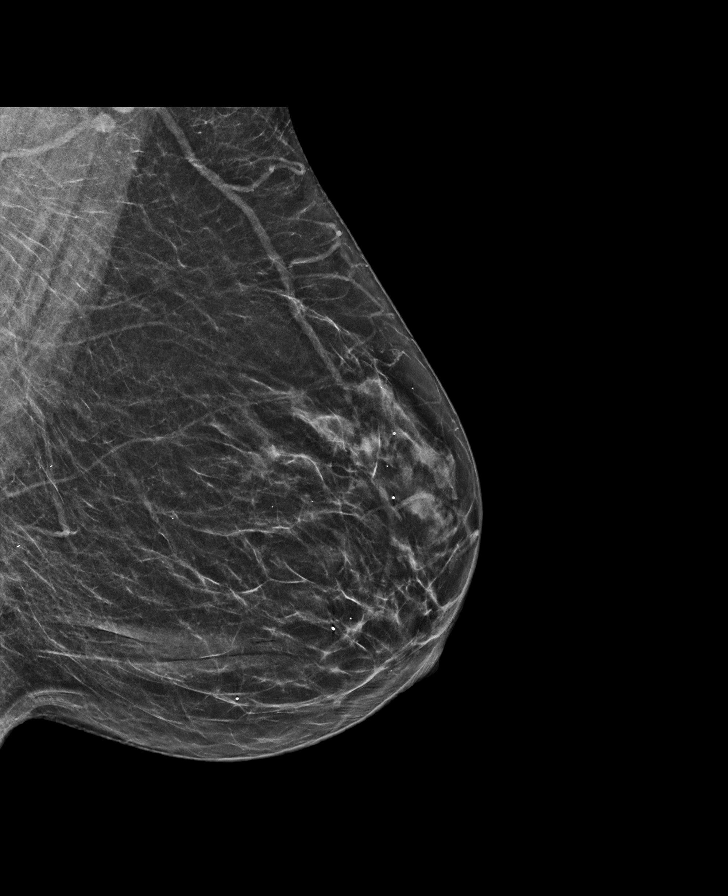

[L CC synth-2D]
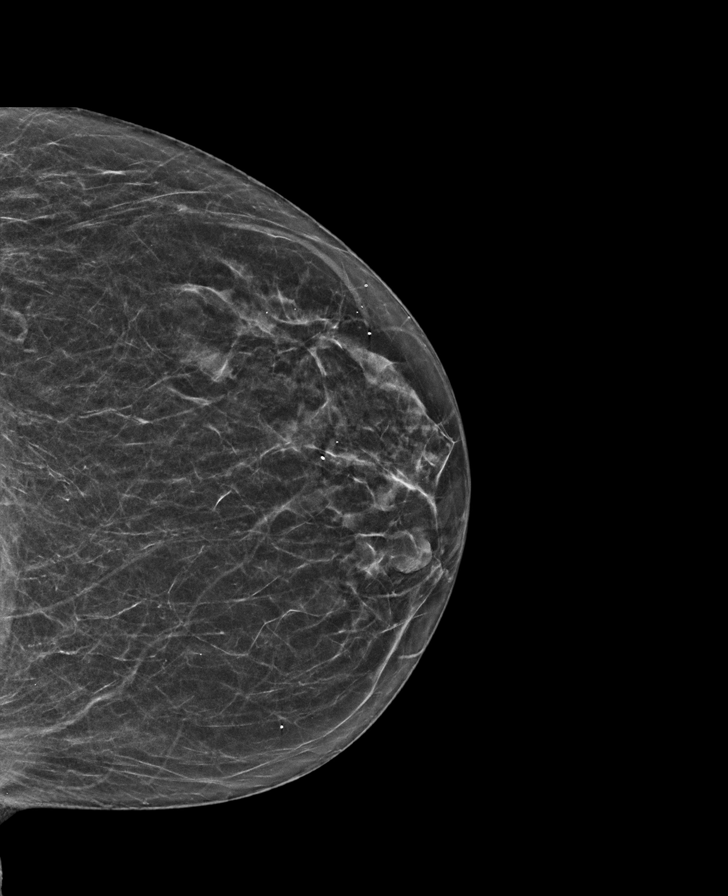

[R CC tomo · tomo slice 27/52.0]
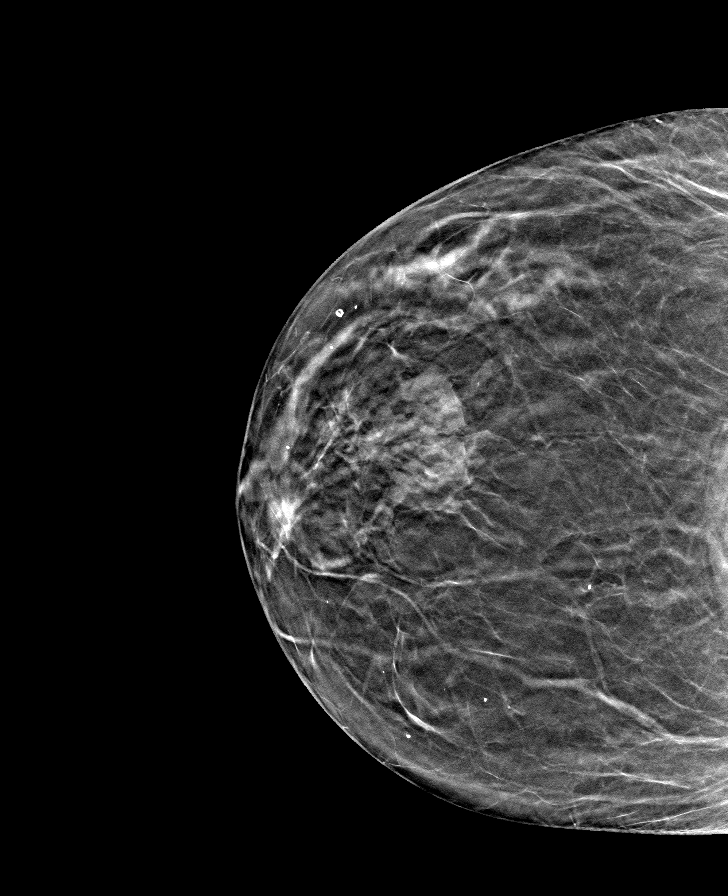

[R MLO tomo · tomo slice 32/63.0]
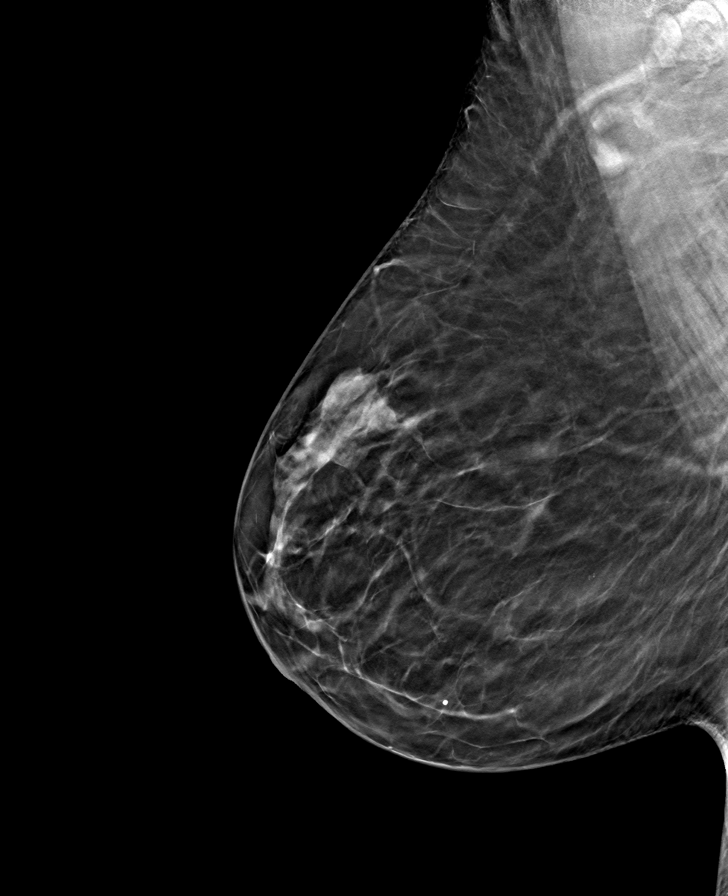

[L CC tomo · tomo slice 27/53.0]
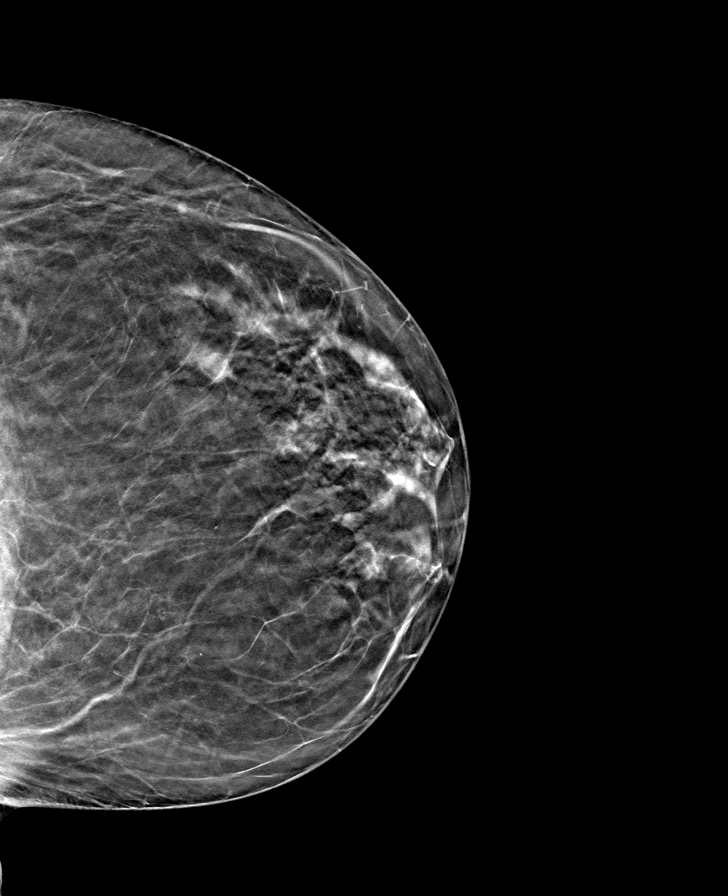

[L MLO tomo · tomo slice 31/62.0]
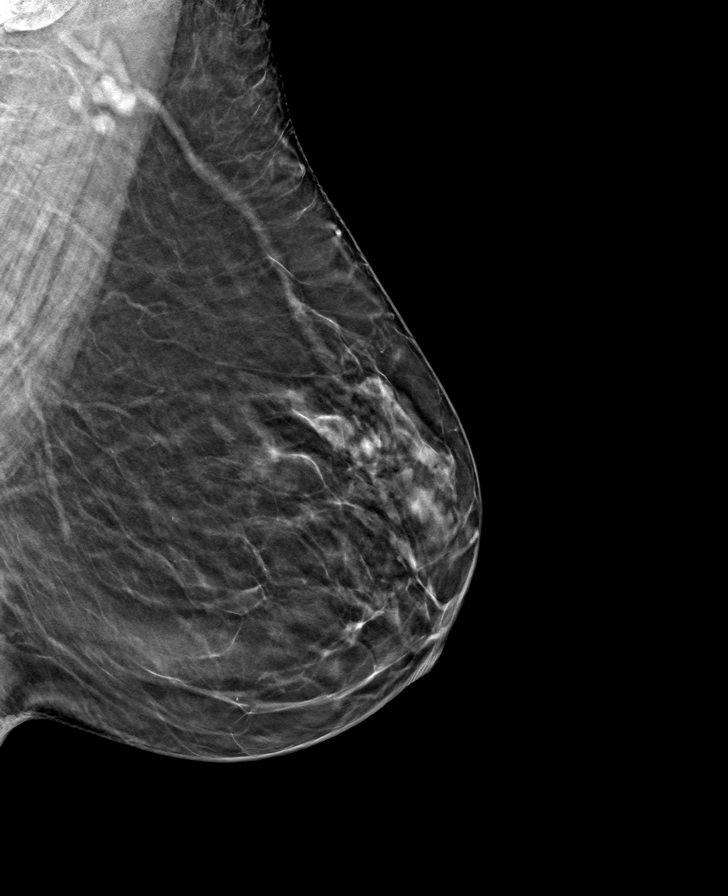

[8 of 24 positions shown; findings below may reference images not displayed]

ACR Breast Density Category b: There are scattered areas of
fibroglandular density.
FINDINGS: There are no findings suspicious for malignancy. Images were
processed with CAD.
IMPRESSION: No mammographic evidence of malignancy. A result letter of this
screening mammogram will be mailed directly to the patient.

RECOMMENDATION:
Screening mammogram in one year. (Code:[TQ])

BI-RADS CATEGORY  1: Negative.

## 2019-08-16 ENCOUNTER — Other Ambulatory Visit: Payer: Self-pay | Admitting: Neurological Surgery

## 2019-08-31 NOTE — Progress Notes (Signed)
[id number] [place], [place] - [address] AT [facility] [address] [place] [address] Phone: [phone] Fax: [phone]      Your procedure is scheduled on November 18  Report to [facility] Main Entrance "A" at 1100 A.M., and check in at the Admitting office.  Call this number if you have problems the morning of surgery:  [phone]  Call [phone] if you have any questions prior to your surgery date Monday-Friday 8am-4pm    Remember:  Do not eat or drink after midnight the night before your surgery    Take these medicines the morning of surgery with A SIP OF WATER  acetaminophen (TYLENOL) if needed albuterol (PROVENTIL HFA;VENTOLIN HFA) , Please bring all inhalers with you the day of surgery.  cetirizine (ZYRTEC) Eye drops if needed fluticasone (FLONASE) methocarbamol (ROBAXIN) if needed omeprazole (PRILOSEC)  7 days prior to surgery STOP taking any Aspirin (unless otherwise instructed by your surgeon), Aleve, Naproxen, Ibuprofen, Motrin, Advil, Goody's, BC's, all herbal medications, fish oil, and all vitamins.    The Morning of Surgery  Do not wear jewelry, make-up or nail polish.  Do not wear lotions, powders, or perfumes/colognes, or deodorant  Do not shave 48 hours prior to surgery.    Do not bring valuables to the hospital.  [facility] is not responsible for any belongings or valuables.  If you are a smoker, DO NOT Smoke 24 hours prior to surgery IF you wear a CPAP at night please bring your mask, tubing, and machine the morning of surgery   Remember that you must have someone to transport you home after your surgery, and remain with you for 24 hours if you are discharged the same day.   Contacts, glasses, hearing aids, dentures or bridgework may not be worn into surgery.    Leave your suitcase in the car.  After surgery it may be brought to your room.  For patients admitted to the hospital,  discharge time will be determined by your treatment team.  Patients discharged the day of surgery will not be allowed to drive home.    Special instructions:   [facility]- Preparing For Surgery  Before surgery, you can play an important role. Because skin is not sterile, your skin needs to be as free of germs as possible. You can reduce the number of germs on your skin by washing with CHG (chlorahexidine gluconate) Soap before surgery.  CHG is an antiseptic cleaner which kills germs and bonds with the skin to continue killing germs even after washing.    Oral Hygiene is also important to reduce your risk of infection.  Remember - BRUSH YOUR TEETH THE MORNING OF SURGERY WITH YOUR REGULAR TOOTHPASTE  Please do not use if you have an allergy to CHG or antibacterial soaps. If your skin becomes reddened/irritated stop using the CHG.  Do not shave (including legs and underarms) for at least 48 hours prior to first CHG shower. It is OK to shave your face.  Please follow these instructions carefully.   1. Shower the NIGHT BEFORE SURGERY and the MORNING OF SURGERY with CHG Soap.   2. If you chose to wash your hair, wash your hair first as usual with your normal shampoo.  3. After you shampoo, rinse your hair and body thoroughly to remove the shampoo.  4. Use CHG as you would any other liquid soap. You can apply CHG directly to the skin and wash gently  with a scrungie or a clean washcloth.   5. Apply the CHG Soap to your body ONLY FROM THE NECK DOWN.  Do not use on open wounds or open sores. Avoid contact with your eyes, ears, mouth and genitals (private parts). Wash Face and genitals (private parts)  with your normal soap.   6. Wash thoroughly, paying special attention to the area where your surgery will be performed.  7. Thoroughly rinse your body with warm water from the neck down.  8. DO NOT shower/wash with your normal soap after using and rinsing off the CHG Soap.  9. Pat yourself dry  with a CLEAN TOWEL.  10. Wear CLEAN PAJAMAS to bed the night before surgery, wear comfortable clothes the morning of surgery  11. Place CLEAN SHEETS on your bed the night of your first shower and DO NOT SLEEP WITH PETS.    Day of Surgery:  Do not apply any deodorants/lotions. Please shower the morning of surgery with the CHG soap  Please wear clean clothes to the hospital/surgery center.   Remember to brush your teeth WITH YOUR REGULAR TOOTHPASTE.   Please read over the following fact sheets that you were given.

## 2019-09-03 ENCOUNTER — Encounter (HOSPITAL_COMMUNITY): Payer: Self-pay

## 2019-09-03 ENCOUNTER — Other Ambulatory Visit: Payer: Self-pay

## 2019-09-03 ENCOUNTER — Encounter (HOSPITAL_COMMUNITY)
Admission: RE | Admit: 2019-09-03 | Discharge: 2019-09-03 | Disposition: A | Discharge status: Home / Self Care | Payer: Medicaid Other | Source: Ambulatory Visit | Attending: Neurological Surgery | Admitting: Neurological Surgery

## 2019-09-03 ENCOUNTER — Ambulatory Visit (HOSPITAL_COMMUNITY)
Admission: RE | Admit: 2019-09-03 | Discharge: 2019-09-03 | Disposition: A | Discharge status: Home / Self Care | Payer: Medicaid Other | Source: Ambulatory Visit | Attending: Neurological Surgery | Admitting: Neurological Surgery

## 2019-09-03 ENCOUNTER — Other Ambulatory Visit (HOSPITAL_COMMUNITY)
Admission: RE | Admit: 2019-09-03 | Discharge: 2019-09-03 | Disposition: A | Discharge status: Home / Self Care | Payer: Medicaid Other | Source: Ambulatory Visit | Attending: Neurological Surgery | Admitting: Neurological Surgery

## 2019-09-03 DIAGNOSIS — Z20828 Contact with and (suspected) exposure to other viral communicable diseases: Secondary | ICD-10-CM | POA: Insufficient documentation

## 2019-09-03 DIAGNOSIS — M4310 Spondylolisthesis, site unspecified: Secondary | ICD-10-CM | POA: Diagnosis not present

## 2019-09-03 DIAGNOSIS — Z01818 Encounter for other preprocedural examination: Secondary | ICD-10-CM | POA: Diagnosis not present

## 2019-09-03 HISTORY — DX: Unspecified osteoarthritis, unspecified site: M19.90

## 2019-09-03 HISTORY — DX: Headache, unspecified: R51.9

## 2019-09-03 LAB — CBC WITH DIFFERENTIAL/PLATELET
Abs Immature Granulocytes: 0.01 10*3/uL (ref 0.00–0.07)
Basophils Absolute: 0 10*3/uL (ref 0.0–0.1)
Basophils Relative: 1 %
Eosinophils Absolute: 0.1 10*3/uL (ref 0.0–0.5)
Eosinophils Relative: 1 %
HCT: 42.2 % (ref 36.0–46.0)
Hemoglobin: 13.8 g/dL (ref 12.0–15.0)
Immature Granulocytes: 0 %
Lymphocytes Relative: 42 %
Lymphs Abs: 2.4 10*3/uL (ref 0.7–4.0)
MCH: 31.7 pg (ref 26.0–34.0)
MCHC: 32.7 g/dL (ref 30.0–36.0)
MCV: 97 fL (ref 80.0–100.0)
Monocytes Absolute: 0.5 10*3/uL (ref 0.1–1.0)
Monocytes Relative: 8 %
Neutro Abs: 2.8 10*3/uL (ref 1.7–7.7)
Neutrophils Relative %: 48 %
Platelets: 212 10*3/uL (ref 150–400)
RBC: 4.35 MIL/uL (ref 3.87–5.11)
RDW: 12.8 % (ref 11.5–15.5)
WBC: 5.8 10*3/uL (ref 4.0–10.5)
nRBC: 0 % (ref 0.0–0.2)

## 2019-09-03 LAB — SURGICAL PCR SCREEN
MRSA, PCR: NEGATIVE
Staphylococcus aureus: NEGATIVE

## 2019-09-03 LAB — TYPE AND SCREEN
ABO/RH(D): O POS
Antibody Screen: NEGATIVE

## 2019-09-03 LAB — PROTIME-INR
INR: 1.1 (ref 0.8–1.2)
Prothrombin Time: 13.6 seconds (ref 11.4–15.2)

## 2019-09-03 LAB — BASIC METABOLIC PANEL
Anion gap: 12 (ref 5–15)
BUN: 9 mg/dL (ref 6–20)
CO2: 25 mmol/L (ref 22–32)
Calcium: 9.3 mg/dL (ref 8.9–10.3)
Chloride: 103 mmol/L (ref 98–111)
Creatinine, Ser: 0.62 mg/dL (ref 0.44–1.00)
GFR calc Af Amer: >60 mL/min (ref >60)
GFR calc non Af Amer: >60 mL/min (ref >60)
Glucose, Bld: 89 mg/dL (ref 70–99)
Potassium: 3.9 mmol/L (ref 3.5–5.1)
Sodium: 140 mmol/L (ref 135–145)

## 2019-09-03 LAB — ABO/RH: ABO/RH(D): O POS

## 2019-09-03 LAB — SARS CORONAVIRUS 2 (TAT 6-24 HRS): SARS Coronavirus 2: NEGATIVE

## 2019-09-03 IMAGING — CR DG CHEST 2V
2 series · 2 of 2 positions shown · non-contrast
Comparison: [DATE]

CLINICAL DATA: Preoperative evaluation.  History of asthma

EXAM:
CHEST - 2 VIEW

[w chest pa]
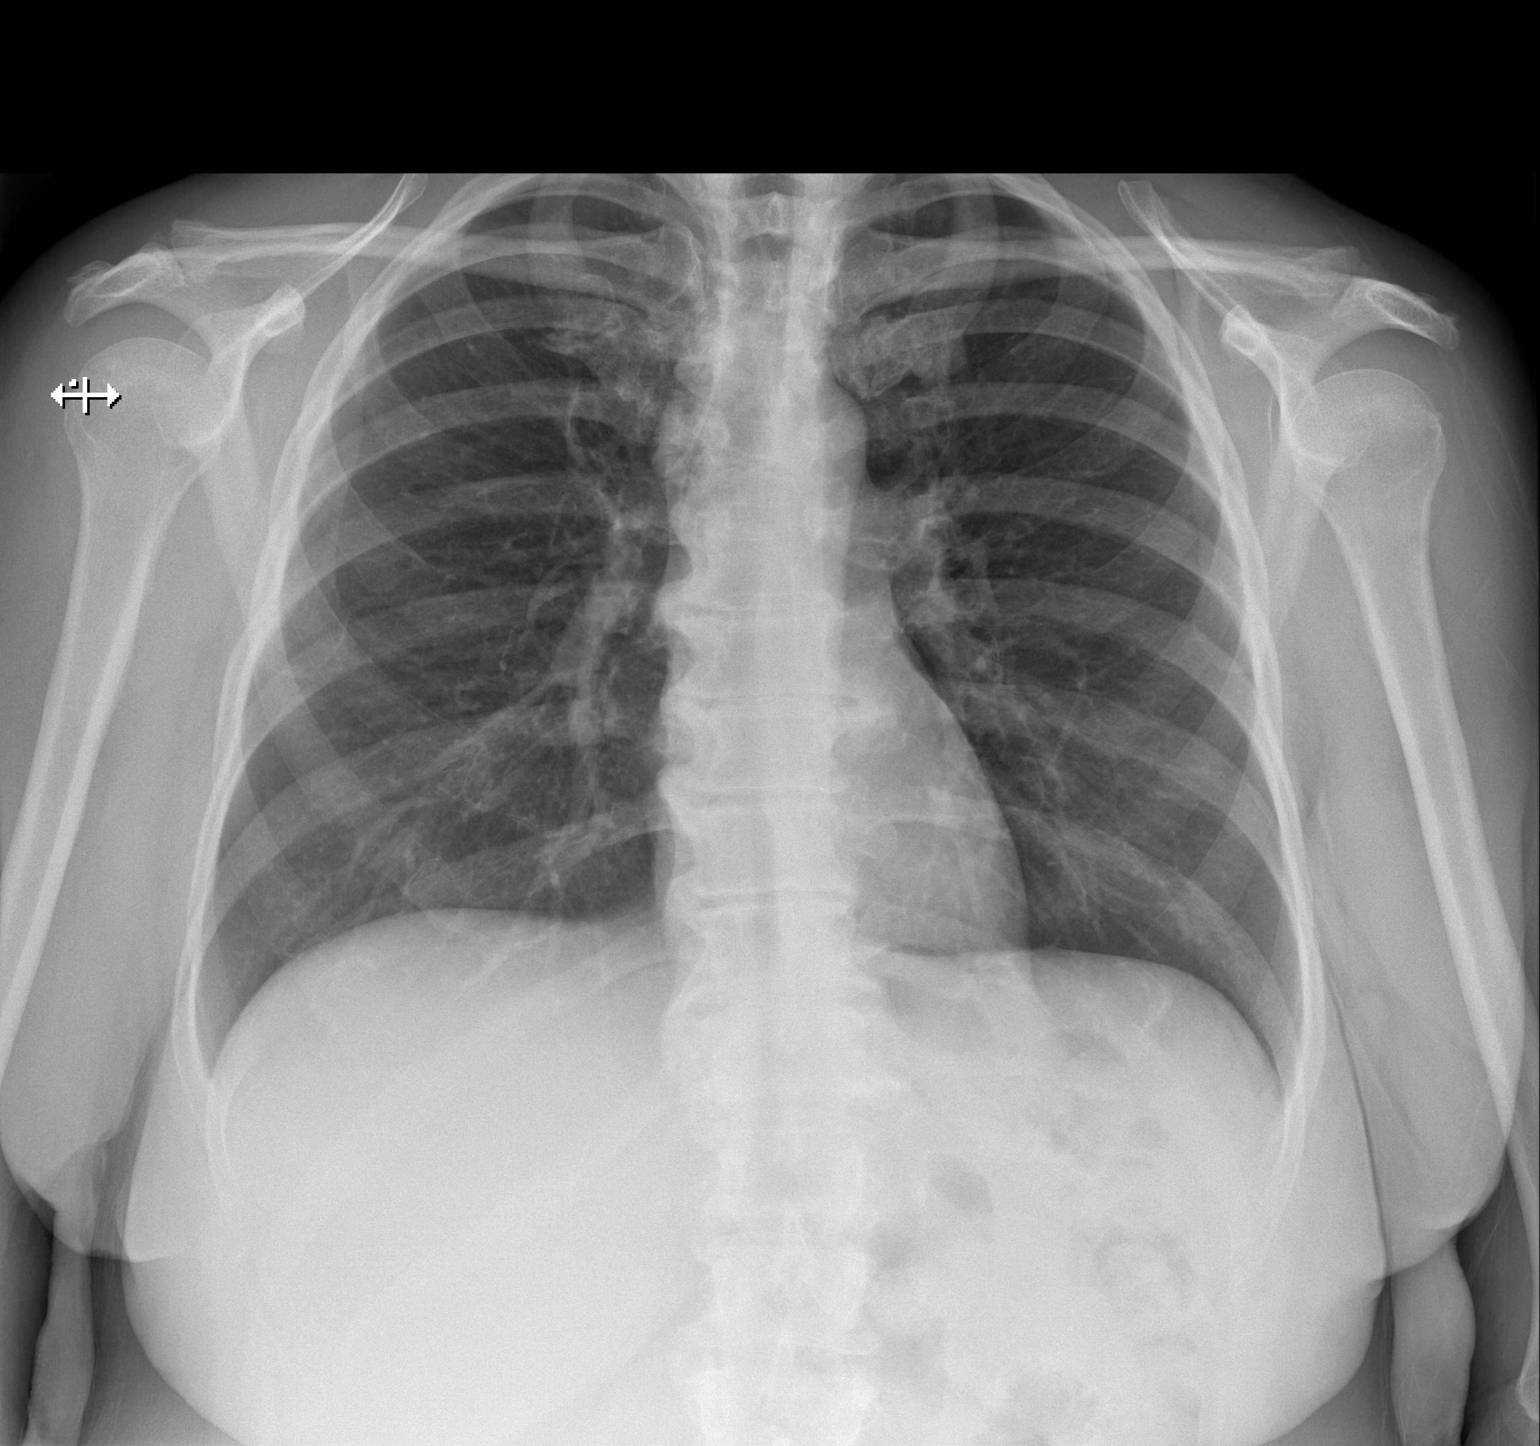

[w chest lat]
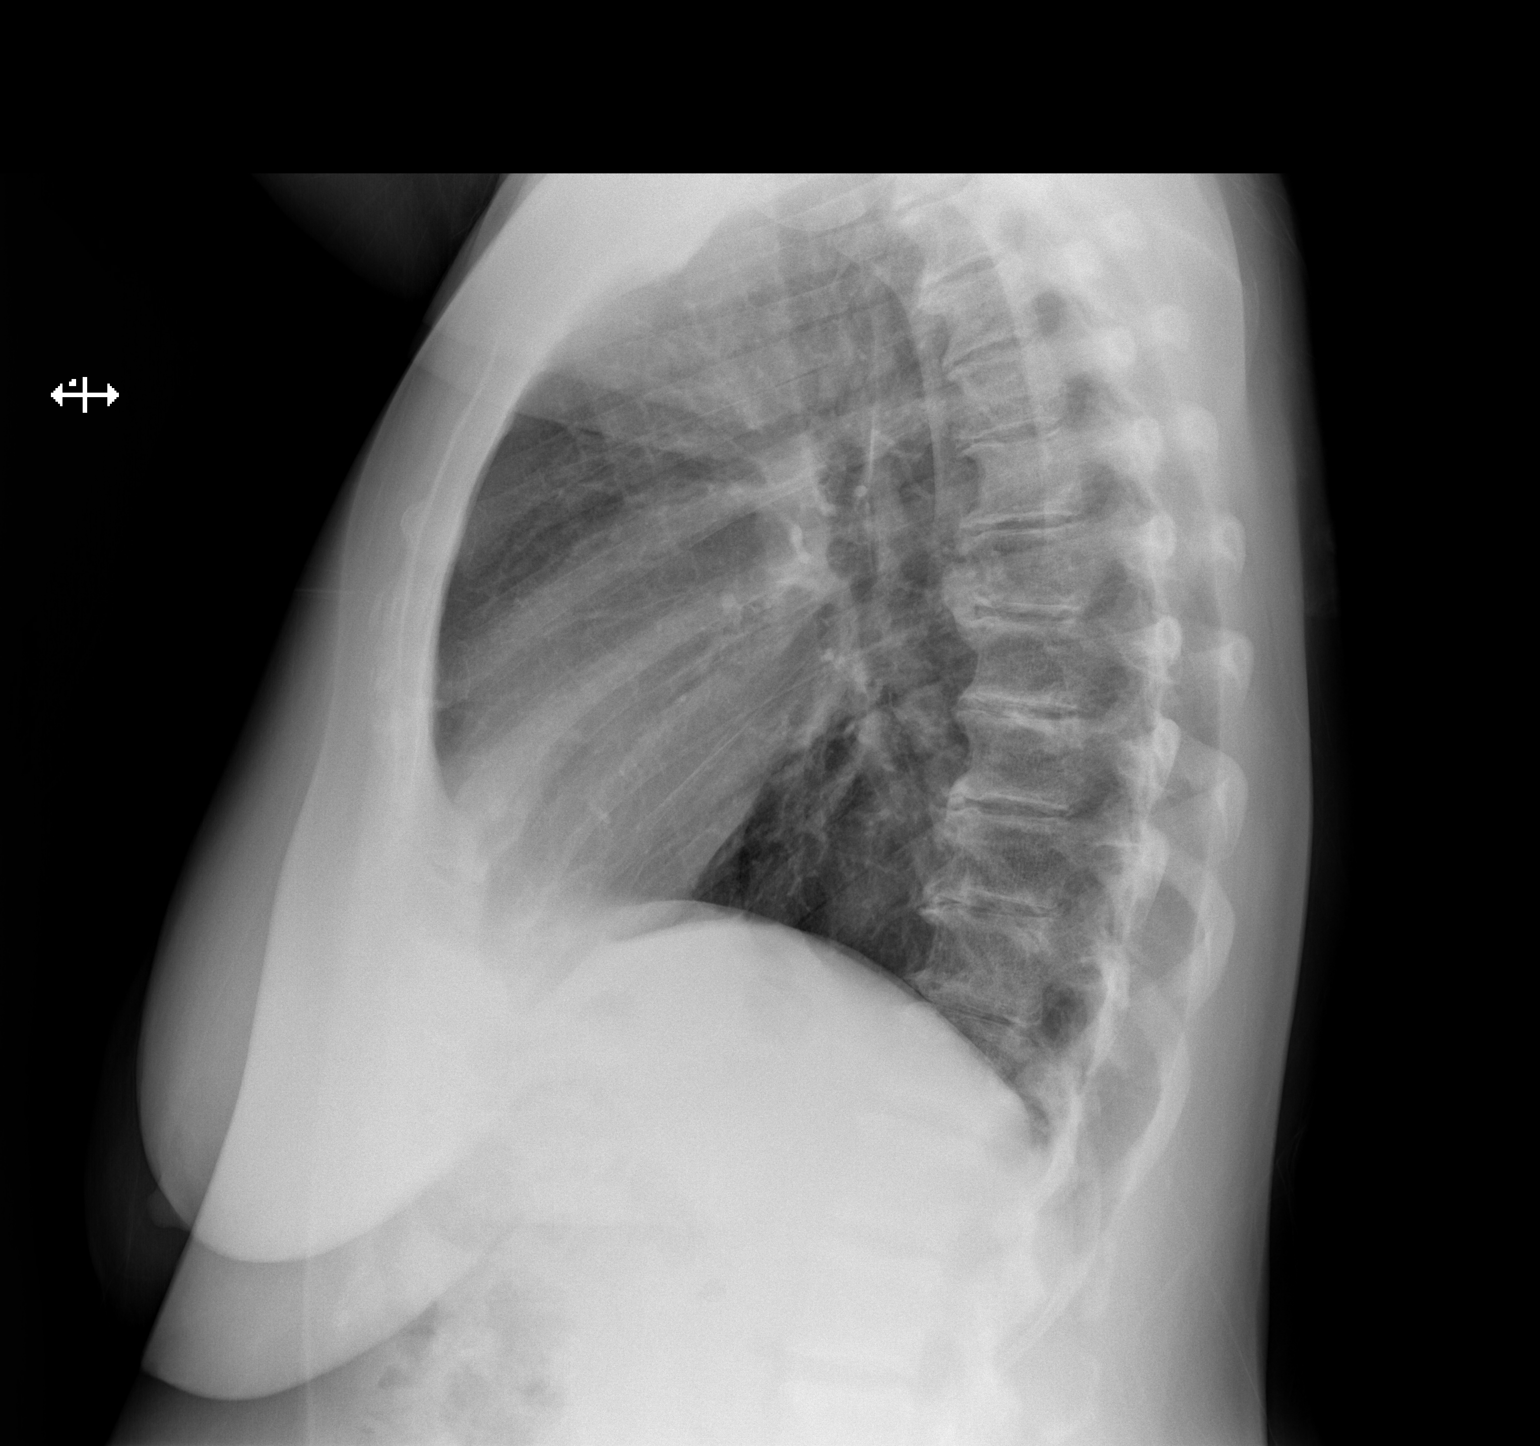

[2 of 2 positions shown; findings below may reference images not displayed]

FINDINGS: Lungs are clear. Heart size and pulmonary vascularity are normal. No
adenopathy. There is degenerative change in the thoracic spine.
IMPRESSION: No edema or consolidation.

## 2019-09-03 NOTE — Progress Notes (Signed)
PCP - Dr. Nolene Ebbs Cardiologist - denies  PPM/ICD - denies Device Orders - N/A Rep Notified - N/A  Chest x-ray - 09/03/19 EKG - 09/03/19 Stress Test - denies ECHO - denies Cardiac Cath - denies  Sleep Study - denies CPAP - denies  Blood Thinner Instructions:N/A Aspirin Instructions:N/A  ERAS Protcol -N/A PRE-SURGERY Ensure or G2-   COVID TEST- 09/03/19, today after PAT appt.    Anesthesia review: No  Patient denies shortness of breath, fever, cough and chest pain at PAT appointment   All instructions explained to the patient, with a verbal understanding of the material. Patient agrees to go over the instructions while at home for a better understanding. Patient also instructed to self quarantine after being tested for COVID-19. The opportunity to ask questions was provided.    Coronavirus Screening  Have you experienced the following symptoms:  Cough yes/no: No Fever (>100.5F)  yes/no: No Runny nose yes/no: No Sore throat yes/no: No Difficulty breathing/shortness of breath  yes/no: No  Have you or a family member traveled in the last 14 days and where? yes/no: No   If the patient indicates "YES" to the above questions, their PAT will be rescheduled to limit the exposure to others and, the surgeon will be notified. THE PATIENT WILL NEED TO BE ASYMPTOMATIC FOR 14 DAYS.   If the patient is not experiencing any of these symptoms, the PAT nurse will instruct them to NOT bring anyone with them to their appointment since they may have these symptoms or traveled as well.   Please remind your patients and families that hospital visitation restrictions are in effect and the importance of the restrictions.

## 2019-09-05 ENCOUNTER — Encounter (HOSPITAL_COMMUNITY)
Admission: RE | Disposition: A | Discharge status: Home / Self Care | Payer: Self-pay | Source: Ambulatory Visit | Attending: Neurological Surgery

## 2019-09-05 ENCOUNTER — Encounter (HOSPITAL_COMMUNITY): Payer: Self-pay | Admitting: Certified Registered Nurse Anesthetist

## 2019-09-05 ENCOUNTER — Inpatient Hospital Stay (HOSPITAL_COMMUNITY): Payer: Medicaid Other | Admitting: Physician Assistant

## 2019-09-05 ENCOUNTER — Inpatient Hospital Stay (HOSPITAL_COMMUNITY)
Admission: RE | Admit: 2019-09-05 | Discharge: 2019-09-07 | DRG: 455 | Disposition: A | Discharge status: Home / Self Care | Payer: Medicaid Other | Source: Ambulatory Visit | Attending: Neurological Surgery | Admitting: Neurological Surgery

## 2019-09-05 ENCOUNTER — Inpatient Hospital Stay (HOSPITAL_COMMUNITY): Payer: Medicaid Other

## 2019-09-05 ENCOUNTER — Other Ambulatory Visit: Payer: Self-pay

## 2019-09-05 ENCOUNTER — Inpatient Hospital Stay (HOSPITAL_COMMUNITY): Payer: Medicaid Other | Admitting: Certified Registered Nurse Anesthetist

## 2019-09-05 DIAGNOSIS — Z8249 Family history of ischemic heart disease and other diseases of the circulatory system: Secondary | ICD-10-CM

## 2019-09-05 DIAGNOSIS — Z833 Family history of diabetes mellitus: Secondary | ICD-10-CM | POA: Diagnosis not present

## 2019-09-05 DIAGNOSIS — Z91013 Allergy to seafood: Secondary | ICD-10-CM

## 2019-09-05 DIAGNOSIS — Z981 Arthrodesis status: Secondary | ICD-10-CM

## 2019-09-05 DIAGNOSIS — Z809 Family history of malignant neoplasm, unspecified: Secondary | ICD-10-CM

## 2019-09-05 DIAGNOSIS — Z885 Allergy status to narcotic agent status: Secondary | ICD-10-CM

## 2019-09-05 DIAGNOSIS — Z825 Family history of asthma and other chronic lower respiratory diseases: Secondary | ICD-10-CM

## 2019-09-05 DIAGNOSIS — Z882 Allergy status to sulfonamides status: Secondary | ICD-10-CM

## 2019-09-05 DIAGNOSIS — Z419 Encounter for procedure for purposes other than remedying health state, unspecified: Secondary | ICD-10-CM

## 2019-09-05 DIAGNOSIS — M48061 Spinal stenosis, lumbar region without neurogenic claudication: Secondary | ICD-10-CM | POA: Diagnosis present

## 2019-09-05 DIAGNOSIS — Z91010 Allergy to peanuts: Secondary | ICD-10-CM

## 2019-09-05 DIAGNOSIS — G43909 Migraine, unspecified, not intractable, without status migrainosus: Secondary | ICD-10-CM | POA: Diagnosis present

## 2019-09-05 DIAGNOSIS — E079 Disorder of thyroid, unspecified: Secondary | ICD-10-CM | POA: Diagnosis present

## 2019-09-05 DIAGNOSIS — Z79899 Other long term (current) drug therapy: Secondary | ICD-10-CM

## 2019-09-05 DIAGNOSIS — E785 Hyperlipidemia, unspecified: Secondary | ICD-10-CM | POA: Diagnosis present

## 2019-09-05 DIAGNOSIS — J45909 Unspecified asthma, uncomplicated: Secondary | ICD-10-CM | POA: Diagnosis present

## 2019-09-05 DIAGNOSIS — Z8349 Family history of other endocrine, nutritional and metabolic diseases: Secondary | ICD-10-CM | POA: Diagnosis not present

## 2019-09-05 DIAGNOSIS — M1990 Unspecified osteoarthritis, unspecified site: Secondary | ICD-10-CM | POA: Diagnosis present

## 2019-09-05 DIAGNOSIS — M4317 Spondylolisthesis, lumbosacral region: Secondary | ICD-10-CM | POA: Diagnosis present

## 2019-09-05 DIAGNOSIS — K219 Gastro-esophageal reflux disease without esophagitis: Secondary | ICD-10-CM | POA: Diagnosis present

## 2019-09-05 LAB — POCT PREGNANCY, URINE: Preg Test, Ur: NEGATIVE

## 2019-09-05 IMAGING — RF DG C-ARM 1-60 MIN
1 series · 3 of 3 positions shown · non-contrast
Comparison: None.

CLINICAL DATA: Posterior lumbar fusion

EXAM:
LUMBAR SPINE - 2-3 VIEW; DG C-ARM 1-60 MIN

[Series 1: run · 3 of 3 slices shown]
[im 1/3]
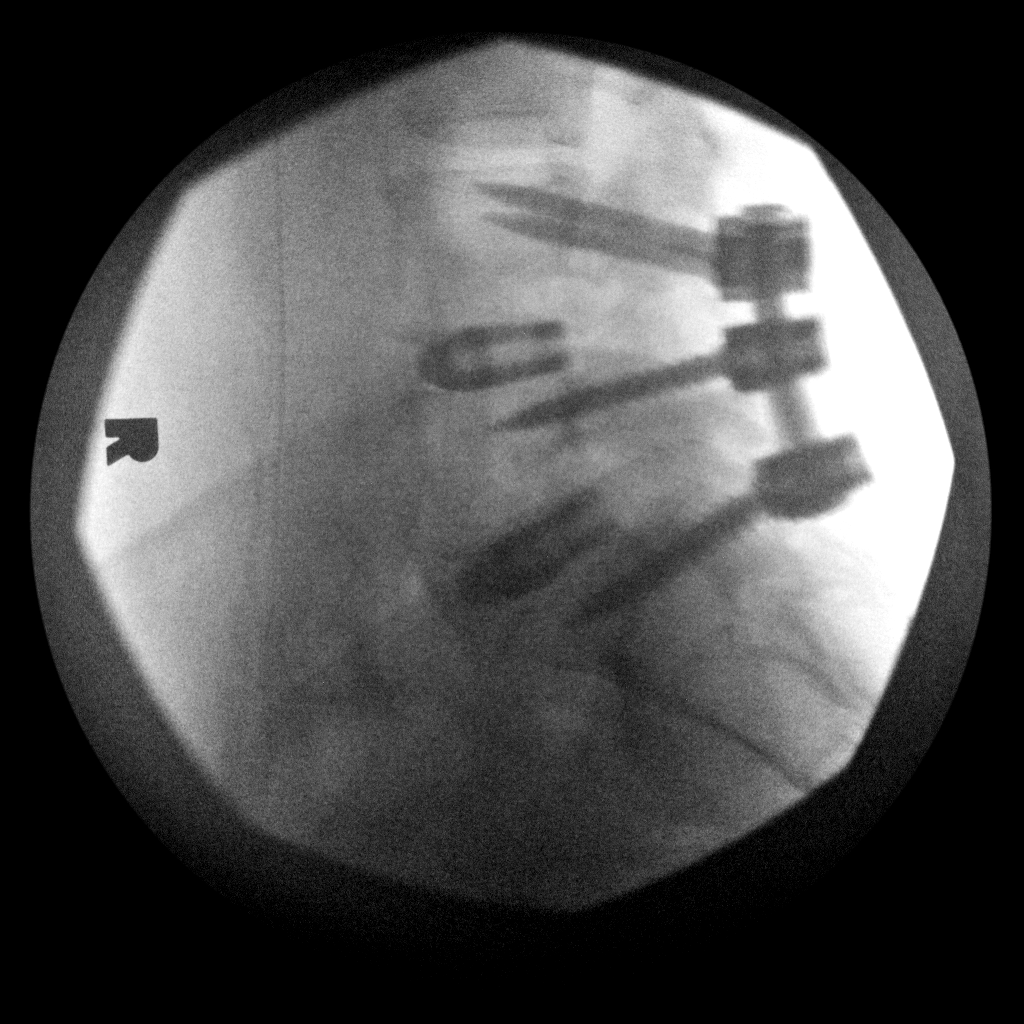
[im 2/3]
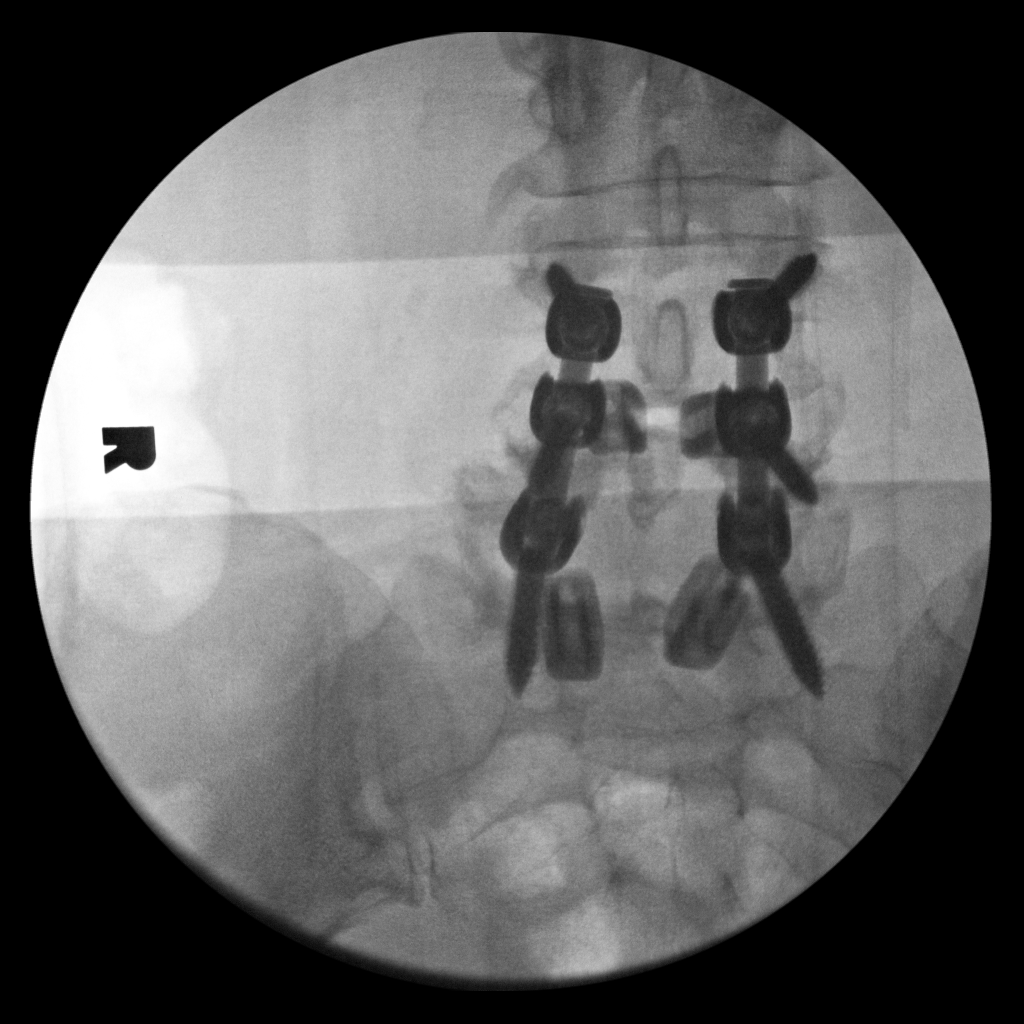
[im 3/3]
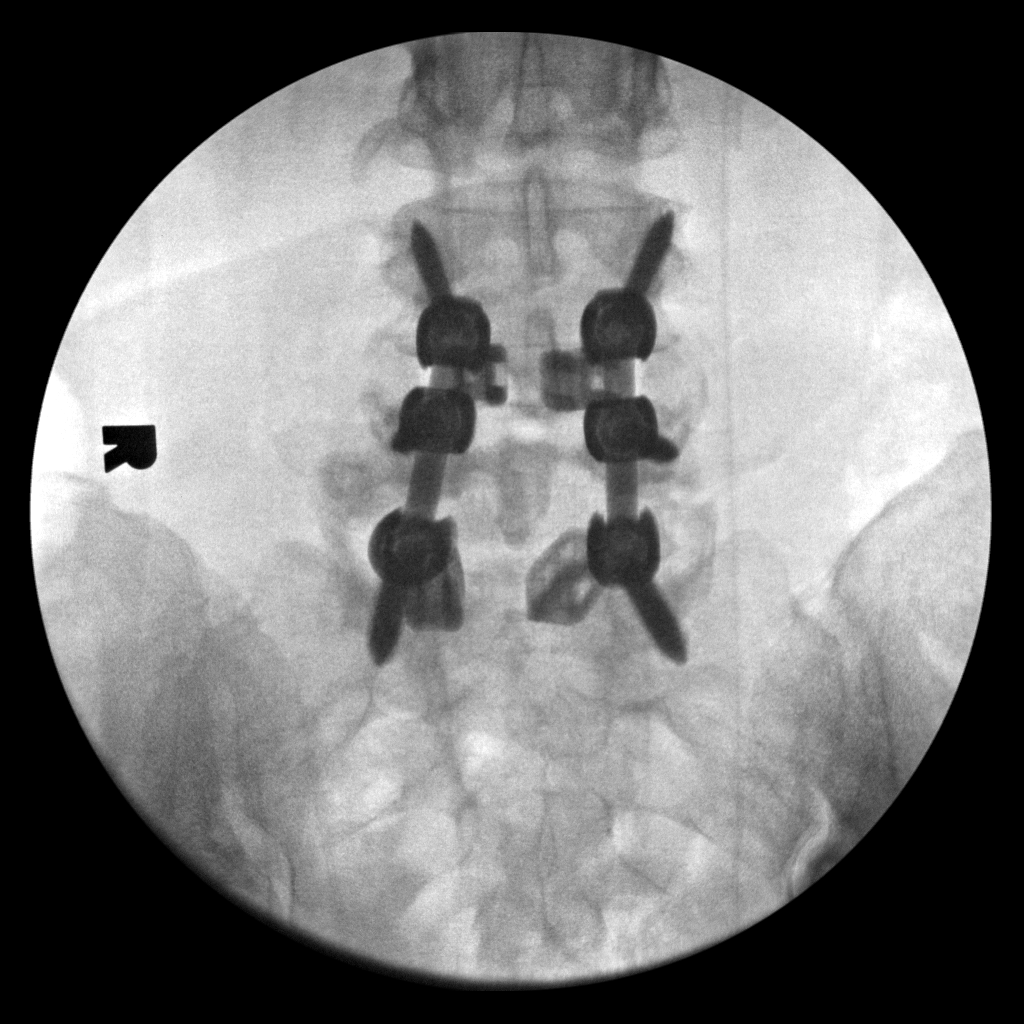

[3 of 3 positions shown; findings below may reference images not displayed]

FLUOROSCOPY TIME:  Fluoroscopy Time:  1 minutes 32 seconds

Radiation Exposure Index (if provided by the fluoroscopic device):
Not available

Number of Acquired Spot Images: 3
FINDINGS: Interbody fusion is noted at L4-5 and L5-S1 with posterior fixation.
No acute bony abnormality is noted.
IMPRESSION: Lumbar fusion from L4-S1.

## 2019-09-05 IMAGING — RF DG LUMBAR SPINE 2-3V
1 series · 3 of 3 positions shown · non-contrast
Comparison: None.

CLINICAL DATA: Posterior lumbar fusion

EXAM:
LUMBAR SPINE - 2-3 VIEW; DG C-ARM 1-60 MIN

[Series 1: run · 3 of 3 slices shown]
[im 1/3]
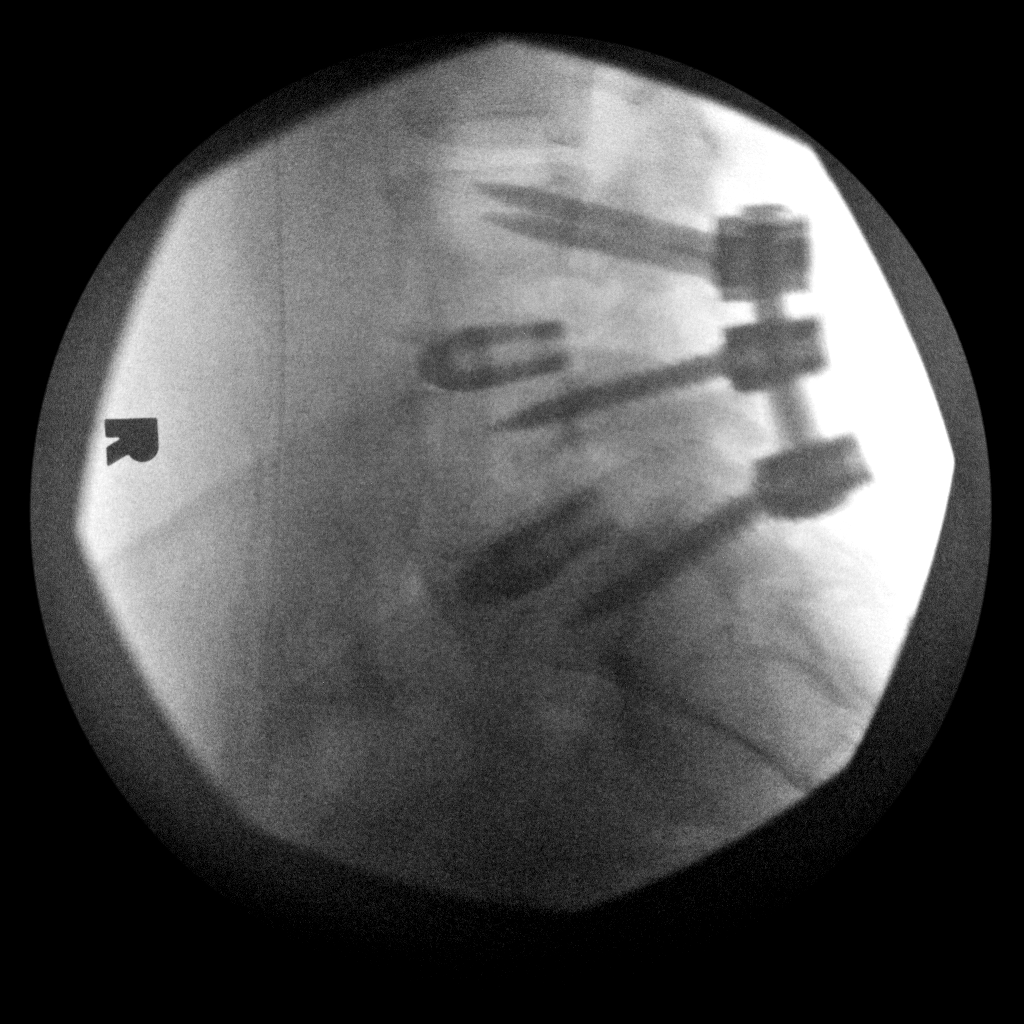
[im 2/3]
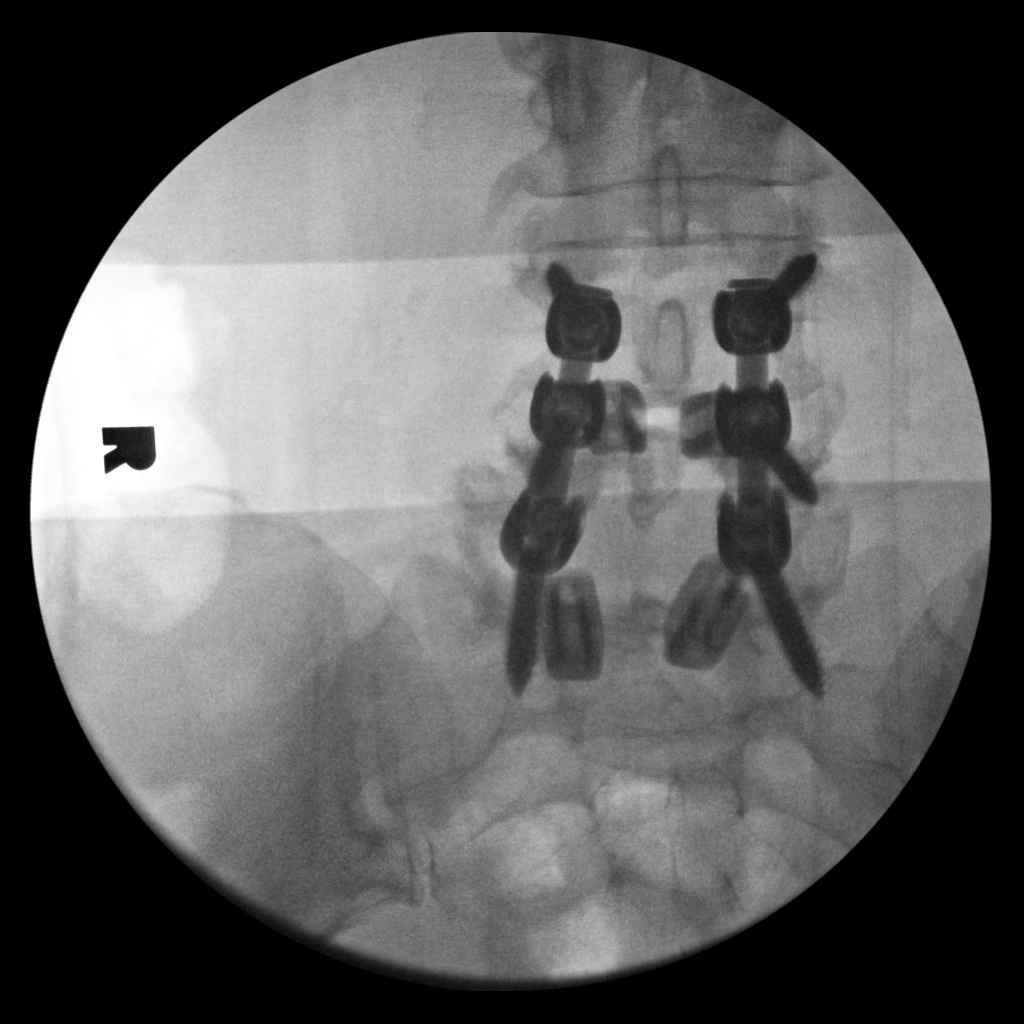
[im 3/3]
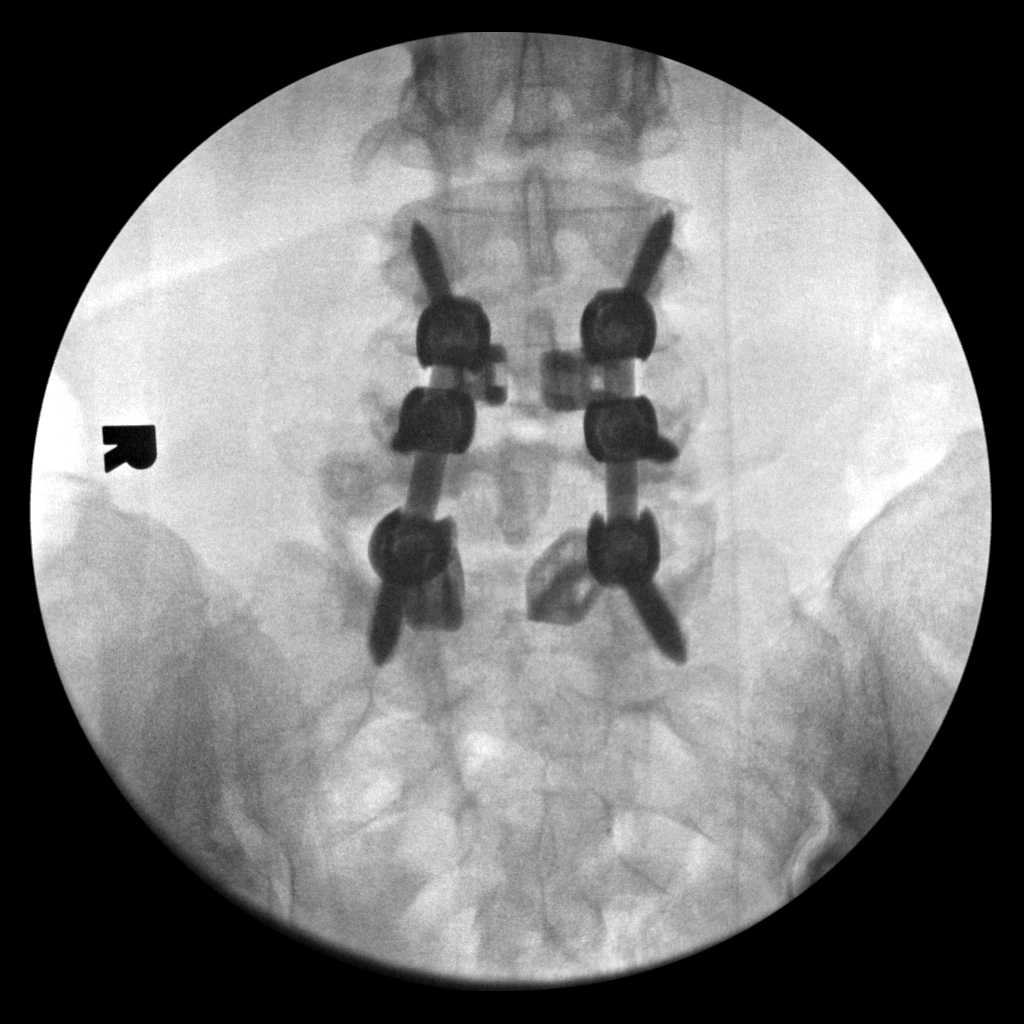

[3 of 3 positions shown; findings below may reference images not displayed]

FLUOROSCOPY TIME:  Fluoroscopy Time:  1 minutes 32 seconds

Radiation Exposure Index (if provided by the fluoroscopic device):
Not available

Number of Acquired Spot Images: 3
FINDINGS: Interbody fusion is noted at L4-5 and L5-S1 with posterior fixation.
No acute bony abnormality is noted.
IMPRESSION: Lumbar fusion from L4-S1.

## 2019-09-05 SURGERY — POSTERIOR LUMBAR FUSION 2 LEVEL
Anesthesia: General | Site: Back

## 2019-09-05 MED ORDER — ONDANSETRON HCL 4 MG/2ML IJ SOLN
INTRAMUSCULAR | Status: AC
  Filled 2019-09-05: qty 2

## 2019-09-05 MED ORDER — ROCURONIUM BROMIDE 10 MG/ML (PF) SYRINGE
PREFILLED_SYRINGE | INTRAVENOUS | Status: AC
  Filled 2019-09-05: qty 10

## 2019-09-05 MED ORDER — METHOCARBAMOL 500 MG PO TABS
500.0000 mg | ORAL_TABLET | Freq: Four times a day (QID) | ORAL | Status: DC | PRN
  Administered 2019-09-05 – 2019-09-07 (×4): 500 mg via ORAL
  Filled 2019-09-05 (×4): qty 1

## 2019-09-05 MED ORDER — SODIUM CHLORIDE 0.9 % IV SOLN: 250.0000 mL | INTRAVENOUS | Status: DC

## 2019-09-05 MED ORDER — DEXAMETHASONE SODIUM PHOSPHATE 10 MG/ML IJ SOLN: 10.0000 mg | Freq: Once | INTRAMUSCULAR | Status: DC

## 2019-09-05 MED ORDER — ALBUTEROL SULFATE HFA 108 (90 BASE) MCG/ACT IN AERS
INHALATION_SPRAY | RESPIRATORY_TRACT | Status: DC | PRN
  Administered 2019-09-05: 4 via RESPIRATORY_TRACT

## 2019-09-05 MED ORDER — MIDAZOLAM HCL 5 MG/5ML IJ SOLN
INTRAMUSCULAR | Status: DC | PRN
  Administered 2019-09-05: 2 mg via INTRAVENOUS

## 2019-09-05 MED ORDER — MIDAZOLAM HCL 2 MG/2ML IJ SOLN
INTRAMUSCULAR | Status: AC
  Filled 2019-09-05: qty 2

## 2019-09-05 MED ORDER — ONDANSETRON HCL 4 MG PO TABS: 4.0000 mg | ORAL_TABLET | Freq: Four times a day (QID) | ORAL | Status: DC | PRN

## 2019-09-05 MED ORDER — ACETAMINOPHEN 10 MG/ML IV SOLN: 1000.0000 mg | Freq: Once | INTRAVENOUS | Status: DC | PRN

## 2019-09-05 MED ORDER — LACTATED RINGERS IV SOLN
INTRAVENOUS | Status: DC | PRN
  Administered 2019-09-05 (×2): via INTRAVENOUS

## 2019-09-05 MED ORDER — PHENOL 1.4 % MT LIQD: 1.0000 | OROMUCOSAL | Status: DC | PRN

## 2019-09-05 MED ORDER — HYDROCODONE-ACETAMINOPHEN 10-325 MG PO TABS
2.0000 | ORAL_TABLET | ORAL | Status: DC | PRN
  Administered 2019-09-05 – 2019-09-06 (×3): 2 via ORAL
  Administered 2019-09-06: 1 via ORAL
  Administered 2019-09-06 – 2019-09-07 (×4): 2 via ORAL
  Filled 2019-09-05 (×9): qty 2

## 2019-09-05 MED ORDER — SUGAMMADEX SODIUM 200 MG/2ML IV SOLN
INTRAVENOUS | Status: DC | PRN
  Administered 2019-09-05: 200 mg via INTRAVENOUS

## 2019-09-05 MED ORDER — ROCURONIUM BROMIDE 50 MG/5ML IV SOSY
PREFILLED_SYRINGE | INTRAVENOUS | Status: DC | PRN
  Administered 2019-09-05: 50 mg via INTRAVENOUS
  Administered 2019-09-05: 20 mg via INTRAVENOUS
  Administered 2019-09-05 (×2): 25 mg via INTRAVENOUS
  Administered 2019-09-05 (×2): 10 mg via INTRAVENOUS
  Administered 2019-09-05: 20 mg via INTRAVENOUS

## 2019-09-05 MED ORDER — DIAZEPAM 5 MG/ML IJ SOLN
2.5000 mg | Freq: Once | INTRAMUSCULAR | Status: AC
  Administered 2019-09-05: 17:00:00 2.5 mg via INTRAVENOUS

## 2019-09-05 MED ORDER — FENTANYL CITRATE (PF) 100 MCG/2ML IJ SOLN
25.0000 ug | INTRAMUSCULAR | Status: DC | PRN
  Administered 2019-09-05 (×2): 50 ug via INTRAVENOUS

## 2019-09-05 MED ORDER — ACETAMINOPHEN 10 MG/ML IV SOLN
INTRAVENOUS | Status: DC | PRN
  Administered 2019-09-05: 1000 mg via INTRAVENOUS

## 2019-09-05 MED ORDER — DEXAMETHASONE SODIUM PHOSPHATE 10 MG/ML IJ SOLN
INTRAMUSCULAR | Status: DC | PRN
  Administered 2019-09-05: 10 mg via INTRAVENOUS

## 2019-09-05 MED ORDER — BUPIVACAINE HCL (PF) 0.25 % IJ SOLN
INTRAMUSCULAR | Status: AC
  Filled 2019-09-05: qty 30

## 2019-09-05 MED ORDER — SODIUM CHLORIDE 0.9% FLUSH: 3.0000 mL | INTRAVENOUS | Status: DC | PRN

## 2019-09-05 MED ORDER — ACETAMINOPHEN 10 MG/ML IV SOLN
INTRAVENOUS | Status: AC
  Filled 2019-09-05: qty 100

## 2019-09-05 MED ORDER — ONDANSETRON HCL 4 MG/2ML IJ SOLN: 4.0000 mg | Freq: Four times a day (QID) | INTRAMUSCULAR | Status: DC | PRN

## 2019-09-05 MED ORDER — OXYCODONE HCL 5 MG/5ML PO SOLN: 5.0000 mg | Freq: Once | ORAL | Status: DC | PRN

## 2019-09-05 MED ORDER — THROMBIN 5000 UNITS EX SOLR
CUTANEOUS | Status: AC
  Filled 2019-09-05: qty 5000

## 2019-09-05 MED ORDER — KETOROLAC TROMETHAMINE 30 MG/ML IJ SOLN
INTRAMUSCULAR | Status: AC
  Filled 2019-09-05: qty 1

## 2019-09-05 MED ORDER — METHOCARBAMOL 1000 MG/10ML IJ SOLN
500.0000 mg | Freq: Four times a day (QID) | INTRAVENOUS | Status: DC | PRN
  Filled 2019-09-05: qty 5

## 2019-09-05 MED ORDER — MENTHOL 3 MG MT LOZG: 1.0000 | LOZENGE | OROMUCOSAL | Status: DC | PRN

## 2019-09-05 MED ORDER — ACETAMINOPHEN 500 MG PO TABS: 1000.0000 mg | ORAL_TABLET | Freq: Once | ORAL | Status: DC | PRN

## 2019-09-05 MED ORDER — LACTATED RINGERS IV SOLN: INTRAVENOUS | Status: DC

## 2019-09-05 MED ORDER — 0.9 % SODIUM CHLORIDE (POUR BTL) OPTIME
TOPICAL | Status: DC | PRN
  Administered 2019-09-05: 14:00:00 1000 mL

## 2019-09-05 MED ORDER — ALBUTEROL SULFATE HFA 108 (90 BASE) MCG/ACT IN AERS: 2.0000 | INHALATION_SPRAY | RESPIRATORY_TRACT | Status: DC | PRN

## 2019-09-05 MED ORDER — MORPHINE SULFATE (PF) 2 MG/ML IV SOLN
2.0000 mg | INTRAVENOUS | Status: DC | PRN
  Administered 2019-09-05: 2 mg via INTRAVENOUS
  Filled 2019-09-05: qty 1

## 2019-09-05 MED ORDER — CEFAZOLIN SODIUM-DEXTROSE 2-4 GM/100ML-% IV SOLN
2.0000 g | Freq: Three times a day (TID) | INTRAVENOUS | Status: AC
  Administered 2019-09-05 – 2019-09-06 (×2): 2 g via INTRAVENOUS
  Filled 2019-09-05 (×2): qty 100

## 2019-09-05 MED ORDER — THROMBIN 20000 UNITS EX SOLR
CUTANEOUS | Status: AC
  Filled 2019-09-05: qty 20000

## 2019-09-05 MED ORDER — DEXAMETHASONE SODIUM PHOSPHATE 4 MG/ML IJ SOLN
4.0000 mg | Freq: Four times a day (QID) | INTRAMUSCULAR | Status: DC
  Administered 2019-09-05 – 2019-09-06 (×2): 4 mg via INTRAVENOUS
  Filled 2019-09-05 (×2): qty 1

## 2019-09-05 MED ORDER — HEPARIN SODIUM (PORCINE) 1000 UNIT/ML IJ SOLN
INTRAMUSCULAR | Status: AC
  Filled 2019-09-05: qty 1

## 2019-09-05 MED ORDER — SODIUM CHLORIDE 0.9 % IV SOLN
INTRAVENOUS | Status: DC | PRN
  Administered 2019-09-05: 14:00:00 500 mL

## 2019-09-05 MED ORDER — POTASSIUM CHLORIDE IN NACL 20-0.9 MEQ/L-% IV SOLN: INTRAVENOUS | Status: DC

## 2019-09-05 MED ORDER — PROPOFOL 10 MG/ML IV BOLUS
INTRAVENOUS | Status: DC | PRN
  Administered 2019-09-05: 100 mg via INTRAVENOUS

## 2019-09-05 MED ORDER — LIDOCAINE 2% (20 MG/ML) 5 ML SYRINGE
INTRAMUSCULAR | Status: DC | PRN
  Administered 2019-09-05: 40 mg via INTRAVENOUS

## 2019-09-05 MED ORDER — DEXAMETHASONE SODIUM PHOSPHATE 10 MG/ML IJ SOLN
INTRAMUSCULAR | Status: AC
  Filled 2019-09-05: qty 1

## 2019-09-05 MED ORDER — DIAZEPAM 5 MG/ML IJ SOLN
INTRAMUSCULAR | Status: AC
  Administered 2019-09-05: 2.5 mg via INTRAVENOUS
  Filled 2019-09-05: qty 2

## 2019-09-05 MED ORDER — OXYCODONE HCL 5 MG PO TABS: 5.0000 mg | ORAL_TABLET | Freq: Once | ORAL | Status: DC | PRN

## 2019-09-05 MED ORDER — FENTANYL CITRATE (PF) 100 MCG/2ML IJ SOLN
INTRAMUSCULAR | Status: AC
  Administered 2019-09-05: 50 ug via INTRAVENOUS
  Filled 2019-09-05: qty 2

## 2019-09-05 MED ORDER — ACETAMINOPHEN 650 MG RE SUPP: 650.0000 mg | RECTAL | Status: DC | PRN

## 2019-09-05 MED ORDER — LIDOCAINE 2% (20 MG/ML) 5 ML SYRINGE
INTRAMUSCULAR | Status: AC
  Filled 2019-09-05: qty 5

## 2019-09-05 MED ORDER — ACETAMINOPHEN 160 MG/5ML PO SOLN: 1000.0000 mg | Freq: Once | ORAL | Status: DC | PRN

## 2019-09-05 MED ORDER — ONDANSETRON HCL 4 MG/2ML IJ SOLN
INTRAMUSCULAR | Status: DC | PRN
  Administered 2019-09-05 (×2): 4 mg via INTRAVENOUS

## 2019-09-05 MED ORDER — CHLORHEXIDINE GLUCONATE CLOTH 2 % EX PADS: 6.0000 | MEDICATED_PAD | Freq: Once | CUTANEOUS | Status: DC

## 2019-09-05 MED ORDER — THROMBIN 20000 UNITS EX SOLR
CUTANEOUS | Status: DC | PRN
  Administered 2019-09-05: 14:00:00 20 mL via TOPICAL

## 2019-09-05 MED ORDER — ARTHREX ANGEL - ACD-A SOLUTION (CHARTING ONLY) OPTIME
TOPICAL | Status: DC | PRN
  Administered 2019-09-05: 10 mL via TOPICAL

## 2019-09-05 MED ORDER — FENTANYL CITRATE (PF) 100 MCG/2ML IJ SOLN
INTRAMUSCULAR | Status: DC | PRN
  Administered 2019-09-05: 100 ug via INTRAVENOUS
  Administered 2019-09-05 (×3): 50 ug via INTRAVENOUS

## 2019-09-05 MED ORDER — SODIUM CHLORIDE 0.9% FLUSH: 3.0000 mL | Freq: Two times a day (BID) | INTRAVENOUS | Status: DC

## 2019-09-05 MED ORDER — ACETAMINOPHEN 325 MG PO TABS
650.0000 mg | ORAL_TABLET | ORAL | Status: DC | PRN
  Filled 2019-09-05: qty 2

## 2019-09-05 MED ORDER — PANTOPRAZOLE SODIUM 40 MG PO TBEC
40.0000 mg | DELAYED_RELEASE_TABLET | Freq: Every day | ORAL | Status: DC
  Administered 2019-09-06: 40 mg via ORAL
  Filled 2019-09-05: qty 1

## 2019-09-05 MED ORDER — CEFAZOLIN SODIUM 1 G IJ SOLR
INTRAMUSCULAR | Status: AC
  Filled 2019-09-05: qty 20

## 2019-09-05 MED ORDER — THROMBIN 5000 UNITS EX SOLR
OROMUCOSAL | Status: DC | PRN
  Administered 2019-09-05: 5 mL via TOPICAL

## 2019-09-05 MED ORDER — DEXAMETHASONE 4 MG PO TABS
4.0000 mg | ORAL_TABLET | Freq: Four times a day (QID) | ORAL | Status: DC
  Administered 2019-09-05 – 2019-09-07 (×5): 4 mg via ORAL
  Filled 2019-09-05 (×5): qty 1

## 2019-09-05 MED ORDER — PHENYLEPHRINE 40 MCG/ML (10ML) SYRINGE FOR IV PUSH (FOR BLOOD PRESSURE SUPPORT)
PREFILLED_SYRINGE | INTRAVENOUS | Status: AC
  Filled 2019-09-05: qty 10

## 2019-09-05 MED ORDER — CYCLOSPORINE 0.05 % OP EMUL
1.0000 [drp] | Freq: Two times a day (BID) | OPHTHALMIC | Status: DC
  Administered 2019-09-06: 1 [drp] via OPHTHALMIC
  Filled 2019-09-05 (×5): qty 30

## 2019-09-05 MED ORDER — LACTATED RINGERS IV SOLN
INTRAVENOUS | Status: DC | PRN
  Administered 2019-09-05: 14:00:00 via INTRAVENOUS

## 2019-09-05 MED ORDER — FENTANYL CITRATE (PF) 250 MCG/5ML IJ SOLN
INTRAMUSCULAR | Status: AC
  Filled 2019-09-05: qty 5

## 2019-09-05 MED ORDER — HEPARIN SODIUM (PORCINE) 1000 UNIT/ML IJ SOLN
INTRAMUSCULAR | Status: DC | PRN
  Administered 2019-09-05: 5000 [IU]

## 2019-09-05 MED ORDER — PROPOFOL 10 MG/ML IV BOLUS
INTRAVENOUS | Status: AC
  Filled 2019-09-05: qty 20

## 2019-09-05 MED ORDER — HYDROXYZINE HCL 50 MG/ML IM SOLN
50.0000 mg | Freq: Four times a day (QID) | INTRAMUSCULAR | Status: DC | PRN
  Administered 2019-09-05: 50 mg via INTRAMUSCULAR
  Filled 2019-09-05: qty 1

## 2019-09-05 MED ORDER — GABAPENTIN 300 MG PO CAPS: 300.0000 mg | ORAL_CAPSULE | Freq: Three times a day (TID) | ORAL | Status: DC | PRN

## 2019-09-05 MED ORDER — SENNA 8.6 MG PO TABS
1.0000 | ORAL_TABLET | Freq: Two times a day (BID) | ORAL | Status: DC
  Administered 2019-09-06 (×2): 8.6 mg via ORAL
  Filled 2019-09-05 (×2): qty 1

## 2019-09-05 MED ORDER — ALBUTEROL SULFATE (2.5 MG/3ML) 0.083% IN NEBU: 2.5000 mg | INHALATION_SOLUTION | RESPIRATORY_TRACT | Status: DC | PRN

## 2019-09-05 MED ORDER — KETOROLAC TROMETHAMINE 15 MG/ML IJ SOLN
INTRAMUSCULAR | Status: DC | PRN
  Administered 2019-09-05: 15 mg via INTRAVENOUS

## 2019-09-05 MED ORDER — OLOPATADINE HCL 0.1 % OP SOLN
1.0000 [drp] | Freq: Every morning | OPHTHALMIC | Status: DC
  Administered 2019-09-06: 1 [drp] via OPHTHALMIC
  Filled 2019-09-05: qty 5

## 2019-09-05 MED ORDER — CEFAZOLIN SODIUM-DEXTROSE 2-4 GM/100ML-% IV SOLN
2.0000 g | INTRAVENOUS | Status: AC
  Administered 2019-09-05: 2 g via INTRAVENOUS

## 2019-09-05 MED ORDER — BUPIVACAINE HCL (PF) 0.25 % IJ SOLN
INTRAMUSCULAR | Status: DC | PRN
  Administered 2019-09-05: 6 mL

## 2019-09-05 SURGICAL SUPPLY — 66 items
ADH SKN CLS APL DERMABOND .7 (GAUZE/BANDAGES/DRESSINGS) ×1
APL SKNCLS STERI-STRIP NONHPOA (GAUZE/BANDAGES/DRESSINGS) ×1
BAG DECANTER FOR FLEXI CONT (MISCELLANEOUS) ×2 IMPLANT
BASKET BONE COLLECTION (BASKET) ×2 IMPLANT
BENZOIN TINCTURE PRP APPL 2/3 (GAUZE/BANDAGES/DRESSINGS) ×2 IMPLANT
BLADE CLIPPER SURG (BLADE) IMPLANT
BUR MATCHSTICK NEURO 3.0 LAGG (BURR) ×2 IMPLANT
CANISTER SUCT 3000ML PPV (MISCELLANEOUS) ×2 IMPLANT
CARTRIDGE OIL MAESTRO DRILL (MISCELLANEOUS) ×1 IMPLANT
CLSR STERI-STRIP ANTIMIC 1/2X4 (GAUZE/BANDAGES/DRESSINGS) ×1 IMPLANT
CONT SPEC 4OZ CLIKSEAL STRL BL (MISCELLANEOUS) ×2 IMPLANT
COVER BACK TABLE 60X90IN (DRAPES) ×2 IMPLANT
COVER WAND RF STERILE (DRAPES) ×1 IMPLANT
DERMABOND ADVANCED (GAUZE/BANDAGES/DRESSINGS) ×1
DERMABOND ADVANCED .7 DNX12 (GAUZE/BANDAGES/DRESSINGS) ×1 IMPLANT
DIFFUSER DRILL AIR PNEUMATIC (MISCELLANEOUS) ×2 IMPLANT
DRAPE C-ARM 42X72 X-RAY (DRAPES) ×2 IMPLANT
DRAPE C-ARMOR (DRAPES) ×2 IMPLANT
DRAPE LAPAROTOMY 100X72X124 (DRAPES) ×2 IMPLANT
DRAPE SURG 17X23 STRL (DRAPES) ×2 IMPLANT
DRSG OPSITE POSTOP 4X6 (GAUZE/BANDAGES/DRESSINGS) ×1 IMPLANT
DURAPREP 26ML APPLICATOR (WOUND CARE) ×2 IMPLANT
ELECT REM PT RETURN 9FT ADLT (ELECTROSURGICAL) ×2
ELECTRODE REM PT RTRN 9FT ADLT (ELECTROSURGICAL) ×1 IMPLANT
EVACUATOR 1/8 PVC DRAIN (DRAIN) ×2 IMPLANT
GAUZE 4X4 16PLY RFD (DISPOSABLE) IMPLANT
GLOVE BIO SURGEON STRL SZ7 (GLOVE) IMPLANT
GLOVE BIO SURGEON STRL SZ8 (GLOVE) ×4 IMPLANT
GLOVE BIOGEL PI IND STRL 7.0 (GLOVE) IMPLANT
GLOVE BIOGEL PI INDICATOR 7.0 (GLOVE)
GOWN STRL REUS W/ TWL LRG LVL3 (GOWN DISPOSABLE) IMPLANT
GOWN STRL REUS W/ TWL XL LVL3 (GOWN DISPOSABLE) ×2 IMPLANT
GOWN STRL REUS W/TWL 2XL LVL3 (GOWN DISPOSABLE) IMPLANT
GOWN STRL REUS W/TWL LRG LVL3 (GOWN DISPOSABLE)
GOWN STRL REUS W/TWL XL LVL3 (GOWN DISPOSABLE) ×4
HEMOSTAT POWDER KIT SURGIFOAM (HEMOSTASIS) IMPLANT
KIT BASIN OR (CUSTOM PROCEDURE TRAY) ×2 IMPLANT
KIT BONE MRW ASP ANGEL CPRP (KITS) ×1 IMPLANT
KIT TURNOVER KIT B (KITS) ×2 IMPLANT
MILL MEDIUM DISP (BLADE) IMPLANT
NDL HYPO 25X1 1.5 SAFETY (NEEDLE) ×1 IMPLANT
NEEDLE HYPO 25X1 1.5 SAFETY (NEEDLE) ×2 IMPLANT
NS IRRIG 1000ML POUR BTL (IV SOLUTION) ×2 IMPLANT
OIL CARTRIDGE MAESTRO DRILL (MISCELLANEOUS) ×2
PACK LAMINECTOMY NEURO (CUSTOM PROCEDURE TRAY) ×2 IMPLANT
PAD ARMBOARD 7.5X6 YLW CONV (MISCELLANEOUS) ×6 IMPLANT
PUTTY DBM ALLOSYNC PURE 10CC (Putty) ×1 IMPLANT
ROD LORD LIPPED TI 5.5X50 (Rod) ×2 IMPLANT
SCREW MOD INVICTUS 5.5X40 (Screw) ×4 IMPLANT
SCREW MOD INVICTUS 6.5X40 (Screw) ×2 IMPLANT
SCREW POLYAXIAL TULIP (Screw) ×6 IMPLANT
SET SCREW (Screw) ×12 IMPLANT
SET SCREW SPNE (Screw) IMPLANT
SPACER PS POROUS 8X9X25 10D (Spacer) ×4 IMPLANT
SPONGE LAP 4X18 RFD (DISPOSABLE) IMPLANT
SPONGE SURGIFOAM ABS GEL 100 (HEMOSTASIS) ×2 IMPLANT
STRIP CLOSURE SKIN 1/2X4 (GAUZE/BANDAGES/DRESSINGS) ×4 IMPLANT
SUT VIC AB 0 CT1 18XCR BRD8 (SUTURE) ×1 IMPLANT
SUT VIC AB 0 CT1 8-18 (SUTURE) ×2
SUT VIC AB 2-0 CP2 18 (SUTURE) ×2 IMPLANT
SUT VIC AB 3-0 SH 8-18 (SUTURE) ×4 IMPLANT
SYR CONTROL 10ML LL (SYRINGE) ×2 IMPLANT
TOWEL GREEN STERILE (TOWEL DISPOSABLE) ×2 IMPLANT
TOWEL GREEN STERILE FF (TOWEL DISPOSABLE) ×2 IMPLANT
TRAY FOLEY MTR SLVR 16FR STAT (SET/KITS/TRAYS/PACK) ×2 IMPLANT
WATER STERILE IRR 1000ML POUR (IV SOLUTION) ×2 IMPLANT

## 2019-09-05 NOTE — Anesthesia Preprocedure Evaluation (Signed)
Anesthesia Evaluation  Patient identified by MRN, date of birth, ID band Patient awake    Reviewed: Allergy & Precautions, NPO status , Patient's Chart, lab work & pertinent test results  History of Anesthesia Complications Negative for: history of anesthetic complications  Airway Mallampati: II  TM Distance: >3 FB Neck ROM: Full    Dental  (+) Dental Advisory Given   Pulmonary asthma , neg recent URI,    breath sounds clear to auscultation       Cardiovascular negative cardio ROS   Rhythm:Regular     Neuro/Psych  Headaches, PSYCHIATRIC DISORDERS Anxiety    GI/Hepatic Neg liver ROS, GERD  Medicated and Controlled,  Endo/Other  negative endocrine ROS  Renal/GU negative Renal ROS     Musculoskeletal  (+) Arthritis ,   Abdominal   Peds  Hematology negative hematology ROS (+)   Anesthesia Other Findings   Reproductive/Obstetrics                             Anesthesia Physical Anesthesia Plan  ASA: II  Anesthesia Plan: General   Post-op Pain Management:    Induction: Intravenous  PONV Risk Score and Plan: 3 and Ondansetron and Dexamethasone  Airway Management Planned: Oral ETT  Additional Equipment: None  Intra-op Plan:   Post-operative Plan: Extubation in OR  Informed Consent: I have reviewed the patients History and Physical, chart, labs and discussed the procedure including the risks, benefits and alternatives for the proposed anesthesia with the patient or authorized representative who has indicated his/her understanding and acceptance.     Dental advisory given  Plan Discussed with: CRNA and Surgeon  Anesthesia Plan Comments:         Anesthesia Quick Evaluation

## 2019-09-05 NOTE — Op Note (Signed)
09/05/2019  4:53 PM  PATIENT:  Bridget Stevens  48 y.o. 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 Decompressive lumbar laminectomy and hemifacetectomy L4-5 L5-S1 (Gill type at L5-S1) requiring more work than would be required for a simple exposure of the disk for PLIF in order to adequately decompress the neural elements and address the spinal stenosis 2. Posterior lumbar interbody fusion L4-5 L5-S1 using PTi interbody cages packed with morcellized allograft and autograft soaked with a BM aspirate obtained for the L IC 3. Posterior fixation L4-S1 inclusive using ATEC cortical pedicle screws.  4. Intertransverse arthrodesis L4-S1 using morcellized autograft and allograft.  SURGEON:  Marikay Alar, MD  ASSISTANTSAdelene Idler FNP  ANESTHESIA:  General  EBL: 150 ml  Total I/O In: 1700 [I.V.:1700] Out: 525 [Urine:375; Blood:150]  BLOOD ADMINISTERED:none  DRAINS: none   INDICATION FOR PROCEDURE: This patient presented with back and leg pain. Imaging revealed lytic spondy L5-S1 with spondylosis l4-5. The patient tried a reasonable attempt at conservative medical measures without relief. I recommended decompression and instrumented fusion to address the stenosis as well as the segmental  instability.  Patient understood the risks, benefits, and alternatives and potential outcomes and wished to proceed.  PROCEDURE DETAILS:  The patient was brought to the operating room. After induction of generalized endotracheal anesthesia the patient was rolled into the prone position on chest rolls and all pressure points were padded. The patient's lumbar region was cleaned and then prepped with DuraPrep and draped in the usual sterile fashion. Anesthesia was injected and then a dorsal midline incision was made and carried down to the lumbosacral fascia. The fascia was opened and the  paraspinous musculature was taken down in a subperiosteal fashion to expose L4-S1. A self-retaining retractor was placed. Intraoperative fluoroscopy confirmed my level, and I started with placement of the L4 cortical pedicle screws. The pedicle screw entry zones were identified utilizing surface landmarks and  AP and lateral fluoroscopy. I scored the cortex with the high-speed drill and then used the hand drill to drill an upward and outward direction into the pedicle. I then tapped line to line. I then placed a 5.5x 40 mm cortical pedicle screw into the pedicles of L4 bilaterally.  I then dissected in a suprafascial plane to expose the iliac crest.  Opened the fascia and we used a Jamshidi needle to extract 60 cc of bone marrow aspirate from the iliac crest with the assistance of my nurse practitioner.  This was then spun down by [facility] device and 2 to 4 cc of  BMAC was soaked on morselized allograft for later arthrodesis.  I dried the hole with Surgifoam and closed the fascia.  I then turned my attention to the decompression and complete lumbar laminectomies, hemi- facetectomies, and foraminotomies were performed at L4-5 and L5-S1.  My nurse practitioner was directly involved in the decompression and exposure of the neural elements. the patient had significant spinal stenosis and this required more work than would be required for a simple exposure of the disc for posterior lumbar interbody fusion which would only require a limited laminotomy. Much more generous decompression and generous foraminotomy was undertaken in order to adequately decompress the neural elements and address the patient's leg pain. The yellow ligament was removed to expose the underlying dura and nerve roots, and generous foraminotomies were performed to adequately decompress the neural elements. Both the exiting and  traversing nerve roots were decompressed on both sides until a coronary dilator passed easily along the nerve roots. Once the  decompression was complete, I turned my attention to the posterior lower lumbar interbody fusion. The epidural venous vasculature was coagulated and cut sharply. Disc space was incised and the initial discectomy was performed with pituitary rongeurs. The disc space was distracted with sequential distractors to a height of 52mm. We then used a series of scrapers and shavers to prepare the endplates for fusion. The midline was prepared with Epstein curettes. Once the complete discectomy was finished, we packed an appropriate sized interbody cage with local autograft and morcellized allograft, gently retracted the nerve root, and tapped the cage into position at L4-5 and L5-S1.  The midline between the cages was packed with morselized autograft and allograft. We then turned our attention to the placement of the lower pedicle screws. The pedicle screw entry zones were identified utilizing surface landmarks and fluoroscopy. I drilled into each pedicle utilizing the hand drill, and tapped each pedicle with the appropriate tap. We palpated with a ball probe to assure no break in the cortex. We then placed 5.5x 40 mm screws at L5 and 6.5 by 68mm screws at S1 into the pedicles.  My nurse practitioner assisted in placement of the pedicle screws.  We then decorticated the transverse processes and laid a mixture of morcellized autograft and allograft out over these to perform intertransverse arthrodesis at L4-S1. We then placed lordotic rods into the multiaxial screw heads of the pedicle screws and locked these in position with the locking caps and anti-torque device. We then checked our construct with AP and lateral fluoroscopy. Irrigated with copious amounts of bacitracin-containing saline solution. Inspected the nerve roots once again to assure adequate decompression, lined to the dura with Gelfoam, and then we closed the muscle and the fascia with 0 Vicryl. Closed the subcutaneous tissues with 2-0 Vicryl and subcuticular  tissues with 3-0 Vicryl. The skin was closed with benzoin and Steri-Strips. Dressing was then applied, the patient was awakened from general anesthesia and transported to the recovery room in stable condition. At the end of the procedure all sponge, needle and instrument counts were correct.   PLAN OF CARE: admit to inpatient  PATIENT DISPOSITION:  PACU - hemodynamically stable.   Delay start of Pharmacological VTE agent (>24hrs) due to surgical blood loss or risk of bleeding:  yes

## 2019-09-05 NOTE — Anesthesia Procedure Notes (Signed)
Procedure Name: Intubation Date/Time: 09/05/2019 1:18 PM Performed by: Inda Coke, CRNA Pre-anesthesia Checklist: Patient identified, Emergency Drugs available, Suction available and Patient being monitored Patient Re-evaluated:Patient Re-evaluated prior to induction Oxygen Delivery Method: Circle System Utilized Preoxygenation: Pre-oxygenation with 100% oxygen Induction Type: IV induction Ventilation: Mask ventilation without difficulty Laryngoscope Size: Mac and 3 Grade View: Grade I Tube type: Oral Tube size: 7.0 mm Number of attempts: 1 Airway Equipment and Method: Stylet and Oral airway Placement Confirmation: ETT inserted through vocal cords under direct vision,  positive ETCO2 and breath sounds checked- equal and bilateral Secured at: 21 cm Tube secured with: Tape Dental Injury: Teeth and Oropharynx as per pre-operative assessment

## 2019-09-05 NOTE — Transfer of Care (Signed)
Immediate Anesthesia Transfer of Care Note  Patient: Bridget Stevens  Procedure(s) Performed: Posterior Lumbar Interbody Fusion - Lumbar Four-Lumbar Five - Lumbar Five-Sacral One (N/A Back)  Patient Location: PACU  Anesthesia Type:General  Level of Consciousness: awake, alert  and oriented  Airway & Oxygen Therapy: Patient Spontanous Breathing and Patient connected to nasal cannula oxygen  Post-op Assessment: Report given to RN and Post -op Vital signs reviewed and stable  Post vital signs: Reviewed and stable  Last Vitals:  Vitals Value Taken Time  BP 104/63 09/05/19 1656  Temp    Pulse 86 09/05/19 1658  Resp 16 09/05/19 1658  SpO2 99 % 09/05/19 1658  Vitals shown include unvalidated device data.  Last Pain:  Vitals:   09/05/19 1132  TempSrc:   PainSc: 10-Worst pain ever      Patients Stated Pain Goal: 2 (90/30/09 2330)  Complications: No apparent anesthesia complications

## 2019-09-05 NOTE — H&P (Signed)
Subjective: Patient is a 48 y.o. female admitted for plif. Onset of symptoms was several months ago, gradually worsening since that time.  The pain is rated severe, and is located at the across the lower back and radiates to legs. The pain is described as aching and occurs all day. The symptoms have been progressive. Symptoms are exacerbated by exercise. MRI or CT showed lytic spondylolisthesis L5-S1 with stenosis L4-S1   Past Medical History:  Diagnosis Date  . Anxiety   . Arthritis   . Asthma   . GERD (gastroesophageal reflux disease)   . Headache   . Hepatomegaly   . Hx of migraines   . Hyperlipidemia   . Thyroid disease     Past Surgical History:  Procedure Laterality Date  . BTL  07/30/2008  . CARPAL TUNNEL RELEASE Right   . CARPAL TUNNEL RELEASE Left 09/22/2016   Procedure: CARPAL TUNNEL RELEASE, left;  Surgeon: Dairl Ponder, MD;  Location: [facility];  Service: Orthopedics;  Laterality: Left;  . CESAREAN SECTION    . DORSAL COMPARTMENT RELEASE Left 09/22/2016   Procedure: RELEASE DORSAL COMPARTMENT (DEQUERVAIN);  Surgeon: Dairl Ponder, MD;  Location: [facility];  Service: Orthopedics;  Laterality: Left;  . HYSTEROSCOPY W/ ENDOMETRIAL ABLATION    . TUBAL LIGATION    . WISDOM TOOTH EXTRACTION      Prior to Admission medications   Medication Sig Start Date End Date Taking? Authorizing Provider  acetaminophen (TYLENOL) 500 MG tablet Take 1,000 mg by mouth every 6 (six) hours as needed for moderate pain.   Yes [provider]  albuterol (PROVENTIL HFA;VENTOLIN HFA) 108 (90 Base) MCG/ACT inhaler Inhale 2 puffs into the lungs every 4 (four) hours as needed for wheezing or shortness of breath.    Yes [provider]  butalbital-acetaminophen-caffeine (FIORICET) 50-325-40 MG tablet Take 1 tablet by mouth every 6 (six) hours as needed for headache. 08/09/19  Yes [provider]  CALCIUM CITRATE PO Take 600 mg by mouth 2  (two) times daily.   Yes [provider]  Calcium Polycarbophil (FIBER-CAPS PO) Take 500 mg by mouth 2 (two) times daily.   Yes [provider]  cetirizine (ZYRTEC) 10 MG tablet Take 10 mg by mouth daily.   Yes [provider]  Cholecalciferol (VITAMIN D) 50 MCG (2000 UT) CAPS Take 2,000 Units by mouth daily.   Yes [provider]  cycloSPORINE (RESTASIS) 0.05 % ophthalmic emulsion Place 1 drop into both eyes 2 (two) times daily.   Yes [provider]  diclofenac sodium (VOLTAREN) 1 % GEL Apply 2 g topically 4 (four) times daily as needed (pain).  07/30/19  Yes [provider]  fluticasone (FLONASE) 50 MCG/ACT nasal spray Place 1 spray into both nostrils daily.   Yes [provider]  gabapentin (NEURONTIN) 300 MG capsule Take 300 mg by mouth 3 (three) times daily as needed (pain).   Yes [provider]  methocarbamol (ROBAXIN) 500 MG tablet Take 500 mg by mouth 2 (two) times daily as needed for muscle spasms.   Yes [provider]  Olopatadine HCl (PAZEO) 0.7 % SOLN Place 1 drop into both eyes every morning.   Yes [provider]  omeprazole (PRILOSEC) 20 MG capsule Take 20 mg by mouth 2 (two) times daily as needed (acid reflux).    Yes [provider]  Pediatric Multiple Vit-C-FA (MULTIVITAMIN ANIMAL SHAPES, WITH CA/FA,) with C & FA chewable tablet Chew 1 tablet by mouth  2 (two) times daily.   Yes [provider]  hydrocodone-ibuprofen (VICOPROFEN) 5-200 MG tablet Take 1 tablet by mouth every 8 (eight) hours as needed for pain. Patient not taking: Reported on 02/13/2018 09/22/16   Charlotte Crumb, MD  ibuprofen (ADVIL,MOTRIN) 800 MG tablet Take 1 tablet (800 mg total) by mouth every 8 (eight) hours as needed for mild pain or moderate pain. Patient not taking: Reported on 08/24/2019 09/27/14   Clayton Bibles, PA-C  traMADol (ULTRAM) 50 MG tablet Take 1 tablet (50 mg total) by mouth every 6 (six)  hours as needed. Patient not taking: Reported on 02/13/2018 07/25/16   Wynona Luna, MD   Allergies  Allergen Reactions  . Shellfish Allergy Anaphylaxis  . Peanut-Containing Drug Products Swelling    SWELLING REACTION UNSPECIFIED   . Sulfa Antibiotics Hives  . Percocet [Oxycodone-Acetaminophen] Other (See Comments)    PT STATES THAT SHE HAS HEADACHES WITH THIS MED    Social History   Tobacco Use  . Smoking status: Never Smoker  . Smokeless tobacco: Never Used  Substance Use Topics  . Alcohol use: Not Currently    Comment: socially    Family History  Problem Relation Age of Onset  . Diabetes Father   . Hypertension Father   . Hyperlipidemia Father   . Thyroid disease Father   . Asthma Father   . Cancer Mother   . Hypertension Mother   . Thyroid disease Mother   . Cancer Sister        CERVICAL  . Thyroid disease Sister   . Asthma Sister   . Asthma Brother   . Breast cancer Neg Hx      Review of Systems  Positive ROS: neg  All other systems have been reviewed and were otherwise negative with the exception of those mentioned in the HPI and as above.  Objective: Vital signs in last 24 hours: Temp:  [98.4 F (36.9 C)] 98.4 F (36.9 C) (11/18 1115) Pulse Rate:  [72] 72 (11/18 1115) Resp:  [18] 18 (11/18 1115) BP: (115)/(48) 115/48 (11/18 1115) SpO2:  [97 %] 97 % (11/18 1115) Weight:  [71.7 kg] 71.7 kg (11/18 1115)  General Appearance: Alert, cooperative, no distress, appears stated age Head: Normocephalic, without obvious abnormality, atraumatic Eyes: PERRL, conjunctiva/corneas clear, EOM's intact    Neck: Supple, symmetrical, trachea midline Back: Symmetric, no curvature, ROM normal, no CVA tenderness Lungs:  respirations unlabored Heart: Regular rate and rhythm Abdomen: Soft, non-tender Extremities: Extremities normal, atraumatic, no cyanosis or edema Pulses: 2+ and symmetric all extremities Skin: Skin color, texture, turgor normal, no rashes or  lesions  NEUROLOGIC:   Mental status: Alert and oriented x4,  no aphasia, good attention span, fund of knowledge, and memory Motor Exam - grossly normal Sensory Exam - grossly normal Reflexes: 1+ Coordination - grossly normal Gait - grossly normal Balance - grossly normal Cranial Nerves: I: smell Not tested  II: visual acuity  OS: nl    OD: nl  II: visual fields Full to confrontation  II: pupils Equal, round, reactive to light  III,VII: ptosis None  III,IV,VI: extraocular muscles  Full ROM  V: mastication Normal  V: facial light touch sensation  Normal  V,VII: corneal reflex  Present  VII: facial muscle function - upper  Normal  VII: facial muscle function - lower Normal  VIII: hearing Not tested  IX: soft palate elevation  Normal  IX,X: gag reflex Present  XI: trapezius strength  5/5  XI: sternocleidomastoid strength  5/5  XI: neck flexion strength  5/5  XII: tongue strength  Normal    Data Review Lab Results  Component Value Date   WBC 5.8 09/03/2019   HGB 13.8 09/03/2019   HCT 42.2 09/03/2019   MCV 97.0 09/03/2019   PLT 212 09/03/2019   Lab Results  Component Value Date   NA 140 09/03/2019   K 3.9 09/03/2019   CL 103 09/03/2019   CO2 25 09/03/2019   BUN 9 09/03/2019   CREATININE 0.62 09/03/2019   GLUCOSE 89 09/03/2019   Lab Results  Component Value Date   INR 1.1 09/03/2019    Assessment/Plan:  Estimated body mass index is 31.38 kg/m as calculated from the following:   Height as of this encounter: 4' 11.5" (1.511 m).   Weight as of this encounter: 71.7 kg. Patient admitted for PLIF L4-5 L5-S1. Patient has failed a reasonable attempt at conservative therapy.  I explained the condition and procedure to the patient and answered any questions.  Patient wishes to proceed with procedure as planned. Understands risks/ benefits and typical outcomes of procedure.   Tia AlertDavid S Merie Wulf 09/05/2019 12:29 PM

## 2019-09-06 NOTE — Evaluation (Signed)
Occupational Therapy Evaluation Patient Details Name: Bridget Stevens MRN: [id number] DOB: 1971-01-12 Today's Date: 09/06/2019    History of Present Illness Pt is a 48 yo female s/p PLIF L4-5. L5-S1; posterior fixation L4-S1; decompressive lumbar laminectomy and hemifacectomy L4-5, L5-S1. PMHx: anxiety, migraines, asthma, carpal tunnel release sx.   Clinical Impression   Pt PTA: Pt reports independence with ADL and mobility. Pt currently AE for LB dressing education provided. Pt performing figure four technique by crossing legs and abiding by back precautions. Pt grooming at sink in standing with 2 cups for rinse and spit. Back handout provided and reviewed ADL in detail. Pt educated on: clothing between brace, never sleep in brace, set an alarm at night for medication, avoid sitting for long periods of time, correct bed positioning for sleeping, correct sequence for bed mobility, avoiding lifting more than 5 pounds and never wash directly over incision. All education is complete and patient indicates understanding. Pt requires continued OT skilled services for tub transfer simulation and AE education with hip kit provided. OT following acutely.     Follow Up Recommendations  No OT follow up    Equipment Recommendations  None recommended by OT    Recommendations for Other Services       Precautions / Restrictions Precautions Precautions: Back Precaution Booklet Issued: Yes (comment) Precaution Comments: verbal discussion of handout Required Braces or Orthoses: Spinal Brace Spinal Brace: Lumbar corset Restrictions Weight Bearing Restrictions: No      Mobility Bed Mobility Overal bed mobility: Needs Assistance Bed Mobility: Rolling;Sidelying to Sit;Sit to Sidelying Rolling: Supervision Sidelying to sit: Supervision     Sit to sidelying: Supervision General bed mobility comments: SupervisionA for log roll technique  Transfers Overall transfer level: Needs  assistance Equipment used: None Transfers: Sit to/from Stand Sit to Stand: Supervision         General transfer comment: safety for initial standing balance    Balance Overall balance assessment: No apparent balance deficits (not formally assessed)                                         ADL either performed or assessed with clinical judgement   ADL Overall ADL's : Needs assistance/impaired Eating/Feeding: Modified independent;Sitting   Grooming: Modified independent;Sitting   Upper Body Bathing: Supervision/ safety;Sitting   Lower Body Bathing: Minimal assistance;Sitting/lateral leans;Sit to/from stand;With adaptive equipment;Cueing for safety   Upper Body Dressing : Supervision/safety;Sitting;Cueing for safety   Lower Body Dressing: Minimal assistance;Cueing for safety;Sitting/lateral leans;Sit to/from stand;With adaptive equipment Lower Body Dressing Details (indicate cue type and reason): AE for LB dressing education provided. Pt performing figure four technique by crossing legs and abiding by back precautions. Toilet Transfer: Warehouse manager and Hygiene: Minimal assistance;Sitting/lateral lean;Sit to/from stand;Cueing for safety       Functional mobility during ADLs: Supervision/safety;Rolling walker;Cueing for safety General ADL Comments: Pt performing ADL routine with cues for UB dressing and LB dressing with figure 4 technique. Pt grooming at sink in standing with 2 cups for rinse and spit.      Vision Baseline Vision/History: No visual deficits Vision Assessment?: No apparent visual deficits     Perception     Praxis      Pertinent Vitals/Pain Pain Assessment: 0-10 Pain Score: 5  Pain Location: low back Pain Descriptors / Indicators: Discomfort Pain Intervention(s): Limited activity within patient's tolerance  Hand Dominance Right   Extremity/Trunk Assessment Upper Extremity  Assessment Upper Extremity Assessment: Overall WFL for tasks assessed   Lower Extremity Assessment Lower Extremity Assessment: Generalized weakness   Cervical / Trunk Assessment Cervical / Trunk Assessment: Other exceptions Cervical / Trunk Exceptions: s/p lumbar sx   Communication Communication Communication: No difficulties   Cognition Arousal/Alertness: Awake/alert Behavior During Therapy: WFL for tasks assessed/performed Overall Cognitive Status: Within Functional Limits for tasks assessed                                     General Comments  Hip kit issued    Exercises     Shoulder Instructions      Home Living Family/patient expects to be discharged to:: Private residence Living Arrangements: Children;Spouse/significant other Available Help at Discharge: Family;Available PRN/intermittently;Available 24 hours/day Type of Home: House Home Access: Ramped entrance     Home Layout: One level     Bathroom Shower/Tub: Teacher, early years/pre: Standard     Home Equipment: Toilet riser          Prior Functioning/Environment Level of Independence: Independent        Comments: Pt reports increased time for ADL and mobility.        OT Problem List: Decreased strength;Decreased activity tolerance;Impaired balance (sitting and/or standing);Decreased safety awareness;Decreased knowledge of use of DME or AE;Pain      OT Treatment/Interventions: Self-care/ADL training;Therapeutic exercise;Neuromuscular education;Energy conservation;DME and/or AE instruction;Therapeutic activities;Patient/family education;Balance training    OT Goals(Current goals can be found in the care plan section) Acute Rehab OT Goals Patient Stated Goal: to go home OT Goal Formulation: With patient Time For Goal Achievement: 09/20/19 Potential to Achieve Goals: Good ADL Goals Pt Will Perform Lower Body Dressing: with set-up;sitting/lateral leans;sit to/from  stand;with adaptive equipment Pt Will Perform Tub/Shower Transfer: with supervision;ambulating  OT Frequency: Min 2X/week   Barriers to D/C:            Co-evaluation              AM-PAC OT "6 Clicks" Daily Activity     Outcome Measure Help from another person eating meals?: None Help from another person taking care of personal grooming?: None Help from another person toileting, which includes using toliet, bedpan, or urinal?: A Little Help from another person bathing (including washing, rinsing, drying)?: A Little Help from another person to put on and taking off regular upper body clothing?: None Help from another person to put on and taking off regular lower body clothing?: A Little 6 Click Score: 21   End of Session Equipment Utilized During Treatment: Back brace Nurse Communication: Mobility status  Activity Tolerance: Patient tolerated treatment well;Patient limited by pain Patient left: in bed;with call bell/phone within reach  OT Visit Diagnosis: Unsteadiness on feet (R26.81);Pain Pain - part of body: (low back)                Time: 4388-8757 OT Time Calculation (min): 36 min Charges:  OT General Charges $OT Visit: 1 Visit OT Evaluation $OT Eval Moderate Complexity: 1 Mod OT Treatments $Self Care/Home Management : 8-22 mins  Ebony Hail Harold Hedge) Marsa Aris OTR/L Acute Rehabilitation Services Pager: [phone] Office: [place] 09/06/2019, 9:24 AM

## 2019-09-06 NOTE — Progress Notes (Signed)
Subjective: Patient reports appropriate back soreness, no leg pain or NTW  Objective: Vital signs in last 24 hours: Temp:  [97.8 F (36.6 C)-99 F (37.2 C)] 98.9 F (37.2 C) (11/19 0718) Pulse Rate:  [72-93] 73 (11/19 0718) Resp:  [12-20] 16 (11/19 0718) BP: (91-115)/(43-63) 98/60 (11/19 0718) SpO2:  [97 %-100 %] 98 % (11/19 0718) Weight:  [71.7 kg] 71.7 kg (11/18 1115)  Intake/Output from previous day: 11/18 0701 - 11/19 0700 In: 1800 [I.V.:1800] Out: 1325 [Urine:1175; Blood:150] Intake/Output this shift: No intake/output data recorded.  Neurologic: Grossly normal  Lab Results: Lab Results  Component Value Date   WBC 5.8 09/03/2019   HGB 13.8 09/03/2019   HCT 42.2 09/03/2019   MCV 97.0 09/03/2019   PLT 212 09/03/2019   Lab Results  Component Value Date   INR 1.1 09/03/2019   BMET Lab Results  Component Value Date   NA 140 09/03/2019   K 3.9 09/03/2019   CL 103 09/03/2019   CO2 25 09/03/2019   GLUCOSE 89 09/03/2019   BUN 9 09/03/2019   CREATININE 0.62 09/03/2019   CALCIUM 9.3 09/03/2019    Studies/Results: Dg Lumbar Spine 2-3 Views  Result Date: 09/05/2019 CLINICAL DATA:  Posterior lumbar fusion EXAM: LUMBAR SPINE - 2-3 VIEW; DG C-ARM 1-60 MIN COMPARISON:  None. FLUOROSCOPY TIME:  Fluoroscopy Time:  1 minutes 32 seconds Radiation Exposure Index (if provided by the fluoroscopic device): Not available Number of Acquired Spot Images: 3 FINDINGS: Interbody fusion is noted at L4-5 and L5-S1 with posterior fixation. No acute bony abnormality is noted. IMPRESSION: Lumbar fusion from L4-S1. Electronically Signed   By: Inez Catalina M.D.   On: 09/05/2019 17:25   Dg C-arm 1-60 Min  Result Date: 09/05/2019 CLINICAL DATA:  Posterior lumbar fusion EXAM: LUMBAR SPINE - 2-3 VIEW; DG C-ARM 1-60 MIN COMPARISON:  None. FLUOROSCOPY TIME:  Fluoroscopy Time:  1 minutes 32 seconds Radiation Exposure Index (if provided by the fluoroscopic device): Not available Number of Acquired  Spot Images: 3 FINDINGS: Interbody fusion is noted at L4-5 and L5-S1 with posterior fixation. No acute bony abnormality is noted. IMPRESSION: Lumbar fusion from L4-S1. Electronically Signed   By: Inez Catalina M.D.   On: 09/05/2019 17:25    Assessment/Plan: Doing well, mobilize with PT/OT, hopefully home tomorrow  Estimated body mass index is 31.38 kg/m as calculated from the following:   Height as of this encounter: 4' 11.5" (1.511 m).   Weight as of this encounter: 71.7 kg.    LOS: 1 day    Bridget Stevens 09/06/2019, 8:11 AM

## 2019-09-06 NOTE — Evaluation (Signed)
Physical Therapy Evaluation Patient Details Name: Bridget Stevens MRN: [id number] DOB: 1970/10/20 Today's Date: 09/06/2019   History of Present Illness  Pt is a 48 yo female s/p PLIF L4-5. L5-S1; posterior fixation L4-S1; decompressive lumbar laminectomy and hemifacectomy L4-5, L5-S1. PMHx: anxiety, migraines, asthma, carpal tunnel release sx.  Clinical Impression  Pt admitted with above diagnosis. At the time of PT eval, pt was able to demonstrate transfers and ambulation with gross supervision for safety and no AD. Pt resistant to some education, but overall was educated on precautions, brace application/wearing schedule, appropriate activity progression, and car transfer. Pt currently with functional limitations due to the deficits listed below (see PT Problem List). Pt will benefit from skilled PT to increase their independence and safety with mobility to allow discharge to the venue listed below.      Follow Up Recommendations No PT follow up;Supervision for mobility/OOB    Equipment Recommendations  None recommended by PT    Recommendations for Other Services       Precautions / Restrictions Precautions Precautions: Back Precaution Booklet Issued: Yes (comment) Precaution Comments: verbal discussion of handout Required Braces or Orthoses: Spinal Brace Spinal Brace: Lumbar corset Restrictions Weight Bearing Restrictions: No      Mobility  Bed Mobility Overal bed mobility: Needs Assistance Bed Mobility: Rolling;Sidelying to Sit;Sit to Sidelying Rolling: Supervision Sidelying to sit: Supervision     Sit to sidelying: Supervision General bed mobility comments: Log roll with HOB elevated.   Transfers Overall transfer level: Needs assistance Equipment used: None Transfers: Sit to/from Stand Sit to Stand: Supervision         General transfer comment: safety for initial standing balance  Ambulation/Gait Ambulation/Gait assistance: Supervision Gait Distance (Feet):  275 Feet Assistive device: None Gait Pattern/deviations: Step-through pattern;Decreased stride length;Trunk flexed;Narrow base of support Gait velocity: Decreased Gait velocity interpretation: <1.31 ft/sec, indicative of household ambulator General Gait Details: Slow and guarded with 1 standing rest break due to reported significant pain and fatigue. Faces pain scale does not indicate pain more than 2/10.   Stairs            Wheelchair Mobility    Modified Rankin (Stroke Patients Only)       Balance Overall balance assessment: No apparent balance deficits (not formally assessed)                                           Pertinent Vitals/Pain Pain Assessment: Faces Faces Pain Scale: Hurts little more Pain Location: low back Pain Descriptors / Indicators: Discomfort Pain Intervention(s): Limited activity within patient's tolerance;Monitored during session;Repositioned    Home Living Family/patient expects to be discharged to:: Private residence Living Arrangements: Children;Spouse/significant other Available Help at Discharge: Family;Available PRN/intermittently;Available 24 hours/day Type of Home: House Home Access: Ramped entrance     Home Layout: One level Home Equipment: Toilet riser      Prior Function Level of Independence: Independent         Comments: Pt reports increased time for ADL and mobility.     Hand Dominance   Dominant Hand: Right    Extremity/Trunk Assessment   Upper Extremity Assessment Upper Extremity Assessment: Overall WFL for tasks assessed    Lower Extremity Assessment Lower Extremity Assessment: Generalized weakness    Cervical / Trunk Assessment Cervical / Trunk Assessment: Other exceptions Cervical / Trunk Exceptions: s/p lumbar sx  Communication  Communication: No difficulties  Cognition Arousal/Alertness: Awake/alert Behavior During Therapy: WFL for tasks assessed/performed Overall Cognitive  Status: Within Functional Limits for tasks assessed                                        General Comments      Exercises     Assessment/Plan    PT Assessment Patient needs continued PT services  PT Problem List Decreased strength;Decreased activity tolerance;Decreased balance;Decreased mobility;Decreased knowledge of use of DME;Decreased safety awareness;Decreased knowledge of precautions;Pain       PT Treatment Interventions DME instruction;Gait training;Functional mobility training;Therapeutic activities;Therapeutic exercise;Balance training;Patient/family education    PT Goals (Current goals can be found in the Care Plan section)  Acute Rehab PT Goals Patient Stated Goal: to go home PT Goal Formulation: With patient Time For Goal Achievement: 09/13/19 Potential to Achieve Goals: Good    Frequency Min 5X/week   Barriers to discharge        Co-evaluation               AM-PAC PT "6 Clicks" Mobility  Outcome Measure Help needed turning from your back to your side while in a flat bed without using bedrails?: None Help needed moving from lying on your back to sitting on the side of a flat bed without using bedrails?: None Help needed moving to and from a bed to a chair (including a wheelchair)?: None Help needed standing up from a chair using your arms (e.g., wheelchair or bedside chair)?: A Little Help needed to walk in hospital room?: A Little Help needed climbing 3-5 steps with a railing? : A Little 6 Click Score: 21    End of Session Equipment Utilized During Treatment: Gait belt Activity Tolerance: Patient tolerated treatment well Patient left: in bed;with call bell/phone within reach Nurse Communication: Mobility status PT Visit Diagnosis: Unsteadiness on feet (R26.81);Pain;Other symptoms and signs involving the nervous system (R29.898) Pain - part of body: (back)    Time: 4166-0630 PT Time Calculation (min) (ACUTE ONLY): 20  min   Charges:   PT Evaluation $PT Eval Moderate Complexity: 1 Mod          Conni Slipper, PT, DPT Acute Rehabilitation Services Pager: [phone] Office: [phone]   Marylynn Pearson 09/06/2019, 1:15 PM

## 2019-09-07 MED ORDER — HYDROCODONE-ACETAMINOPHEN 10-325 MG PO TABS: 2.0000 | ORAL_TABLET | ORAL | 0 refills | Status: DC | PRN

## 2019-09-07 NOTE — Discharge Instructions (Signed)

## 2019-09-07 NOTE — Progress Notes (Signed)
Occupational Therapy Treatment Patient Details Name: Bridget Stevens MRN: [id number] DOB: 11/25/1970 Today's Date: 09/07/2019    History of present illness Pt is a 48 yo female s/p PLIF L4-5. L5-S1; posterior fixation L4-S1; decompressive lumbar laminectomy and hemifacectomy L4-5, L5-S1. PMHx: anxiety, migraines, asthma, carpal tunnel release sx.   OT comments  Pt progressing. AE for LB dressing education complete. Pt preferred to watch instead of use sock aid. Pt education on entire hip kit and pt reported to have used 2 cups for rinsing and spitting for brushing teeth. No cues for ADL routine for UB dressing/LB dressing today. Pt continues to use figure 4 technique. Pt performing tub transfer simulation with supervisionA. Pt does not require continued OT. OT signing off.    Follow Up Recommendations  No OT follow up    Equipment Recommendations  None recommended by OT    Recommendations for Other Services      Precautions / Restrictions Precautions Precautions: Back Precaution Booklet Issued: Yes (comment) Precaution Comments: verbal discussion of handout Required Braces or Orthoses: Spinal Brace Spinal Brace: Lumbar corset Restrictions Weight Bearing Restrictions: No       Mobility Bed Mobility Overal bed mobility: Modified Independent             General bed mobility comments: no need  Transfers Overall transfer level: Needs assistance Equipment used: None Transfers: Sit to/from Stand Sit to Stand: Supervision         General transfer comment: safety for initial standing balance    Balance Overall balance assessment: No apparent balance deficits (not formally assessed)                                         ADL either performed or assessed with clinical judgement   ADL Overall ADL's : Needs assistance/impaired Eating/Feeding: Modified independent;Sitting                   Lower Body Dressing: Set up;Sitting/lateral  leans;Sit to/from stand Lower Body Dressing Details (indicate cue type and reason): AE for LB dressing education complete. Pt preferred to watch instead of use sock aid. Pt education on entire hip kit and pt reported to have used 2 cups for rinsing and spitting for brushing teeth.         Tub/ Shower Transfer: Tub Product manager Details (indicate cue type and reason): Pt simulating transfer with supervisionA. Functional mobility during ADLs: Modified independent General ADL Comments: No cues for ADL routine for UB dressing/LB dressing. Pt continues to use figure 4 technique.     Vision   Vision Assessment?: No apparent visual deficits   Perception     Praxis      Cognition Arousal/Alertness: Awake/alert Behavior During Therapy: WFL for tasks assessed/performed Overall Cognitive Status: Within Functional Limits for tasks assessed                                          Exercises     Shoulder Instructions       General Comments Pt verbalizes "I'm ready to go home today."    Pertinent Vitals/ Pain       Pain Assessment: Faces Faces Pain Scale: Hurts little more Pain Location: low back Pain Descriptors / Indicators: Discomfort Pain Intervention(s): Limited activity within patient's tolerance  Home Living                                          Prior Functioning/Environment              Frequency           Progress Toward Goals  OT Goals(current goals can now be found in the care plan section)  Progress towards OT goals: Progressing toward goals  Acute Rehab OT Goals Patient Stated Goal: to go home OT Goal Formulation: With patient Time For Goal Achievement: 09/20/19 Potential to Achieve Goals: Good ADL Goals Pt Will Perform Lower Body Dressing: with set-up;sitting/lateral leans;sit to/from stand;with adaptive equipment Pt Will Perform Tub/Shower Transfer: with supervision;ambulating  Plan Discharge  plan remains appropriate    Co-evaluation                 AM-PAC OT "6 Clicks" Daily Activity     Outcome Measure   Help from another person eating meals?: None Help from another person taking care of personal grooming?: None Help from another person toileting, which includes using toliet, bedpan, or urinal?: None Help from another person bathing (including washing, rinsing, drying)?: None Help from another person to put on and taking off regular upper body clothing?: None Help from another person to put on and taking off regular lower body clothing?: None 6 Click Score: 24    End of Session Equipment Utilized During Treatment: Back brace  OT Visit Diagnosis: Unsteadiness on feet (R26.81);Pain Pain - part of body: (back)   Activity Tolerance Patient tolerated treatment well   Patient Left in bed;with call bell/phone within reach   Nurse Communication Mobility status        Time: 0720-0735 OT Time Calculation (min): 15 min  Charges: OT General Charges $OT Visit: 1 Visit OT Treatments $Self Care/Home Management : 8-22 mins  Darryl Nestle) Marsa Aris OTR/L Acute Rehabilitation Services Pager: [phone] Office: [phone]     [name] 09/07/2019, 8:20 AM

## 2019-09-07 NOTE — Plan of Care (Signed)
Pt given D/C instructions with verbal understanding. Rx's were sent to pharmacy by MD. Pt's incision is clean and dry with no sign of infection. Pt's IV was removed prior to D/C. Pt D/C'd home via wheelchair per MD order. Pt is stable @ D/C and has no other needs at this time. Christinea Brizuela, RN  

## 2019-09-07 NOTE — Progress Notes (Signed)
PT Cancellation Note  Patient Details Name: Bridget Stevens MRN: [id number] DOB: [birth year]   Cancelled Treatment:    Reason Eval/Treat Not Completed: Patient declined, no reason specified. Pt dressed in room and packing her bag to leave, very dismissive of therapist, stating she is ready to go. Pt states she does not have any further questions for therapist at this time and is about to discharge. Will sign off.    Thelma Comp 09/07/2019, 9:26 AM   Rolinda Roan, PT, DPT Acute Rehabilitation Services Pager: [phone] Office: [phone]

## 2019-09-07 NOTE — Discharge Summary (Signed)
Physician Discharge Summary  Patient ID: Bridget Stevens MRN: [id number] DOB/AGE: 03-30-1971 48 y.o.  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 The patient tolerated the procedure well and was taken to the recovery room and then to the floor in stable condition. The hospital course was routine. There were no complications. The wound remained clean dry and intact. Pt had appropriate back soreness. No complaints of leg pain or new N/T/W. The patient remained afebrile with stable vital signs, and tolerated a regular diet. The patient continued to increase activities, and pain was well controlled with oral pain medications.   Consults: None  Significant Diagnostic Studies:  Results for orders placed or performed during the hospital encounter of 09/05/19  Pregnancy, urine POC  Result Value Ref Range   Preg Test, Ur NEGATIVE NEGATIVE    Chest 2 View  Result Date: 09/03/2019 CLINICAL DATA:  Preoperative evaluation.  History of asthma EXAM: CHEST - 2 VIEW COMPARISON:  April 20, 2010 FINDINGS: Lungs are clear. Heart size and pulmonary vascularity are normal. No adenopathy. There is degenerative change in the thoracic spine. IMPRESSION: No edema or consolidation. Electronically Signed   By: Bretta Bang III M.D.   On: 09/03/2019 14:52   Dg Lumbar Spine 2-3 Views  Result Date: 09/05/2019 CLINICAL DATA:  Posterior lumbar fusion EXAM: LUMBAR SPINE - 2-3 VIEW; DG C-ARM 1-60 MIN COMPARISON:  None. FLUOROSCOPY TIME:  Fluoroscopy Time:  1 minutes 32 seconds Radiation Exposure Index (if provided by the fluoroscopic device): Not available Number of Acquired Spot Images: 3 FINDINGS: Interbody  fusion is noted at L4-5 and L5-S1 with posterior fixation. No acute bony abnormality is noted. IMPRESSION: Lumbar fusion from L4-S1. Electronically Signed   By: Alcide Clever M.D.   On: 09/05/2019 17:25   Dg C-arm 1-60 Min  Result Date: 09/05/2019 CLINICAL DATA:  Posterior lumbar fusion EXAM: LUMBAR SPINE - 2-3 VIEW; DG C-ARM 1-60 MIN COMPARISON:  None. FLUOROSCOPY TIME:  Fluoroscopy Time:  1 minutes 32 seconds Radiation Exposure Index (if provided by the fluoroscopic device): Not available Number of Acquired Spot Images: 3 FINDINGS: Interbody fusion is noted at L4-5 and L5-S1 with posterior fixation. No acute bony abnormality is noted. IMPRESSION: Lumbar fusion from L4-S1. Electronically Signed   By: Alcide Clever M.D.   On: 09/05/2019 17:25    Antibiotics:  Anti-infectives (From admission, onward)   Start     Dose/Rate Route Frequency Ordered Stop   09/05/19 1900  ceFAZolin (ANCEF) IVPB 2g/100 mL premix     2 g 200 mL/hr over 30 Minutes Intravenous Every 8 hours 09/05/19 1805 09/06/19 0237   09/05/19 1407  bacitracin 50,000 Units in sodium chloride 0.9 % 500 mL irrigation  Status:  Discontinued       As needed 09/05/19 1407 09/05/19 1652   09/05/19 1130  ceFAZolin (ANCEF) IVPB 2g/100 mL premix     2 g 200 mL/hr over 30 Minutes Intravenous On call to O.R. 09/05/19 1120 09/05/19 1320      Discharge Exam: Blood pressure (!) 102/56, pulse (!) 54, temperature 97.8 F (36.6 C), temperature source Oral, resp. rate 18, height 4' 11.5" (1.511 m), weight 71.7 kg, SpO2 100 %. Neurologic: Grossly normal Ambulating and voiding well, incision cdi  Discharge Medications:   Allergies as of 09/07/2019      Reactions   Shellfish Allergy Anaphylaxis   Peanut-containing Drug Products Swelling   SWELLING REACTION UNSPECIFIED    Sulfa Antibiotics Hives   Percocet [oxycodone-acetaminophen] Other (See Comments)   PT STATES THAT SHE HAS HEADACHES WITH THIS MED      Medication List    TAKE these  medications   acetaminophen 500 MG tablet Commonly known as: TYLENOL Take 1,000 mg by mouth every 6 (six) hours as needed for moderate pain.   albuterol 108 (90 Base) MCG/ACT inhaler Commonly known as: VENTOLIN HFA Inhale 2 puffs into the lungs every 4 (four) hours as needed for wheezing or shortness of breath.   butalbital-acetaminophen-caffeine 50-325-40 MG tablet Commonly known as: FIORICET Take 1 tablet by mouth every 6 (six) hours as needed for headache.   CALCIUM CITRATE PO Take 600 mg by mouth 2 (two) times daily.   cetirizine 10 MG tablet Commonly known as: ZYRTEC Take 10 mg by mouth daily.   cycloSPORINE 0.05 % ophthalmic emulsion Commonly known as: RESTASIS Place 1 drop into both eyes 2 (two) times daily.   diclofenac sodium 1 % Gel Commonly known as: VOLTAREN Apply 2 g topically 4 (four) times daily as needed (pain).   FIBER-CAPS PO Take 500 mg by mouth 2 (two) times daily.   fluticasone 50 MCG/ACT nasal spray Commonly known as: FLONASE Place 1 spray into both nostrils daily.   gabapentin 300 MG capsule Commonly known as: NEURONTIN Take 300 mg by mouth 3 (three) times daily as needed (pain).   HYDROcodone-acetaminophen 10-325 MG tablet Commonly known as: NORCO Take 2 tablets by mouth every 4 (four) hours as needed for severe pain ((score 7 to 10)).   hydrocodone-ibuprofen 5-200 MG tablet Commonly known as: VICOPROFEN Take 1 tablet by mouth every 8 (eight) hours as needed for pain.   ibuprofen 800 MG tablet Commonly known as: ADVIL Take 1 tablet (800 mg total) by mouth every 8 (eight) hours as needed for mild pain or moderate pain.   methocarbamol 500 MG tablet Commonly known as: ROBAXIN Take 500 mg by mouth 2 (two) times daily as needed for muscle spasms.   multivitamin animal shapes (with Ca/FA) with C & FA chewable tablet Chew 1 tablet by mouth 2 (two) times daily.   omeprazole 20 MG capsule Commonly known as: PRILOSEC Take 20 mg by mouth 2  (two) times daily as needed (acid reflux).   Pazeo 0.7 % Soln Generic drug: Olopatadine HCl Place 1 drop into both eyes every morning.   traMADol 50 MG tablet Commonly known as: ULTRAM Take 1 tablet (50 mg total) by mouth every 6 (six) hours as needed.   Vitamin D 50 MCG (2000 UT) Caps Take 2,000 Units by mouth daily.            Durable Medical Equipment  (From admission, onward)         Start     Ordered   09/05/19 1806  DME Walker rolling  Once    Question:  Patient needs a walker to treat with the following condition  Answer:  S/P lumbar fusion   09/05/19 1805   09/05/19 1806  DME 3 n 1  Once     09/05/19 1805          Disposition: home   Final Dx: PLIF L4-5, L5-S1  Discharge Instructions     Remove dressing in 72 hours   Complete by: As directed  Call MD for:  difficulty breathing, headache or visual disturbances   Complete by: As directed    Call MD for:  hives   Complete by: As directed    Call MD for:  persistant dizziness or light-headedness   Complete by: As directed    Call MD for:  persistant nausea and vomiting   Complete by: As directed    Call MD for:  redness, tenderness, or signs of infection (pain, swelling, redness, odor or green/yellow discharge around incision site)   Complete by: As directed    Call MD for:  severe uncontrolled pain   Complete by: As directed    Call MD for:  temperature >100.4   Complete by: As directed    Diet - low sodium heart healthy   Complete by: As directed    Driving Restrictions   Complete by: As directed    No driving for 2 weeks, no riding in the car for 1 week   Increase activity slowly   Complete by: As directed    Lifting restrictions   Complete by: As directed    No lifting more than 8 lbs         Signed: Tiana LoftKimberly Hannah Colletta Spillers 09/07/2019, 7:47 AM

## 2019-09-09 NOTE — Anesthesia Postprocedure Evaluation (Signed)
Anesthesia Post Note  Patient: GLADA WICKSTROM  Procedure(s) Performed: Posterior Lumbar Interbody Fusion - Lumbar Four-Lumbar Five - Lumbar Five-Sacral One (N/A Back)     Patient location during evaluation: PACU Anesthesia Type: General Level of consciousness: awake and alert Pain management: pain level controlled Vital Signs Assessment: post-procedure vital signs reviewed and stable Respiratory status: spontaneous breathing, nonlabored ventilation, respiratory function stable and patient connected to nasal cannula oxygen Cardiovascular status: blood pressure returned to baseline and stable Postop Assessment: no apparent nausea or vomiting Anesthetic complications: no    Last Vitals:  Vitals:   09/07/19 0356 09/07/19 0745  BP: (!) 102/56 (!) 104/57  Pulse: (!) 54 67  Resp: 18 16  Temp: 36.6 C 36.8 C  SpO2: 100% 99%    Last Pain:  Vitals:   09/07/19 0800  TempSrc:   PainSc: 5                  Adarius Tigges

## 2019-10-29 ENCOUNTER — Ambulatory Visit (HOSPITAL_COMMUNITY)
Admission: EM | Admit: 2019-10-29 | Discharge: 2019-10-29 | Disposition: A | Discharge status: Home / Self Care | Payer: Medicaid Other | Attending: Physician Assistant | Admitting: Physician Assistant

## 2019-10-29 ENCOUNTER — Encounter (HOSPITAL_COMMUNITY): Payer: Self-pay

## 2019-10-29 ENCOUNTER — Other Ambulatory Visit: Payer: Self-pay

## 2019-10-29 DIAGNOSIS — N3000 Acute cystitis without hematuria: Secondary | ICD-10-CM | POA: Insufficient documentation

## 2019-10-29 LAB — POCT URINALYSIS DIP (DEVICE)
Bilirubin Urine: NEGATIVE
Glucose, UA: NEGATIVE mg/dL
Ketones, ur: NEGATIVE mg/dL
Nitrite: NEGATIVE
Protein, ur: NEGATIVE mg/dL
Specific Gravity, Urine: 1.025 (ref 1.005–1.030)
Urobilinogen, UA: 0.2 mg/dL (ref 0.0–1.0)
pH: 6 (ref 5.0–8.0)

## 2019-10-29 MED ORDER — CEPHALEXIN 500 MG PO CAPS: 500.0000 mg | ORAL_CAPSULE | Freq: Two times a day (BID) | ORAL | 0 refills | Status: AC

## 2019-10-29 NOTE — ED Provider Notes (Signed)
[facility]    CSN: [id number] Arrival date & time: 10/29/19  1033      History   Chief Complaint Chief Complaint  Patient presents with  . Flank Pain  . Abdominal Pain    Lower Left    HPI Bridget Stevens is a 49 y.o. female.   Patient reports to urgent care today for 2-3 days of left sided flank and abdominal pain. She reports the pain comes in phases, such that it starts with flank pain and wraps down to her lower central abdomen. It is a 6/10 at its worst. She reports when she takes a muscle relaxer and her already prescribed hydrocodone for her back surgery, the pain subsides and she is able to get comfortable. She does endorse increased urinary frequency but denies pain with urination. She denies diarrhea  But endorses a history of constipation but has had daily bowel movements recently. She denies blood in her stool or pain with having a bowel movement. She denies fever and chills.   She had spinal fusion procedure perform in 08/2019. She is followed for this. She denies association of current pain with pain related to her recovery.      Past Medical History:  Diagnosis Date  . Anxiety   . Arthritis   . Asthma   . GERD (gastroesophageal reflux disease)   . Headache   . Hepatomegaly   . Hx of migraines   . Hyperlipidemia   . Thyroid disease     Patient Active Problem List   Diagnosis Date Noted  . S/P lumbar fusion 09/05/2019  . S/P endometrial ablation - 04/2012 05/29/2012    Past Surgical History:  Procedure Laterality Date  . BTL  07/30/2008  . CARPAL TUNNEL RELEASE Right   . CARPAL TUNNEL RELEASE Left 09/22/2016   Procedure: CARPAL TUNNEL RELEASE, left;  Surgeon: Charlotte Crumb, MD;  Location: [facility];  Service: Orthopedics;  Laterality: Left;  . CESAREAN SECTION    . DORSAL COMPARTMENT RELEASE Left 09/22/2016   Procedure: RELEASE DORSAL COMPARTMENT (DEQUERVAIN);  Surgeon: Charlotte Crumb, MD;  Location: [facility];  Service: Orthopedics;  Laterality: Left;  . HYSTEROSCOPY W/ ENDOMETRIAL ABLATION    . TUBAL LIGATION    . WISDOM TOOTH EXTRACTION      OB History    Gravida  1   Para  1   Term      Preterm      AB      Living  1     SAB      TAB      Ectopic      Multiple      Live Births               Home Medications    Prior to Admission medications   Medication Sig Start Date End Date Taking? Authorizing Provider  acetaminophen (TYLENOL) 500 MG tablet Take 1,000 mg by mouth every 6 (six) hours as needed for moderate pain.    [provider]  albuterol (PROVENTIL HFA;VENTOLIN HFA) 108 (90 Base) MCG/ACT inhaler Inhale 2 puffs into the lungs every 4 (four) hours as needed for wheezing or shortness of breath.     [provider]  butalbital-acetaminophen-caffeine (FIORICET) 50-325-40 MG tablet Take 1 tablet by mouth every 6 (six) hours as needed for headache. 08/09/19   [provider]  CALCIUM CITRATE PO Take 600 mg by mouth 2 (two) times daily.    [provider]  Calcium Polycarbophil (FIBER-CAPS PO) Take 500 mg by mouth 2 (two) times daily.    [provider]  cephALEXin (KEFLEX) 500 MG capsule Take 1 capsule (500 mg total) by mouth 2 (two) times daily for 5 days. 10/29/19 11/03/19  Efosa Treichler, Veryl Speak, PA-C  cetirizine (ZYRTEC) 10 MG tablet Take 10 mg by mouth daily.    [provider]  Cholecalciferol (VITAMIN D) 50 MCG (2000 UT) CAPS Take 2,000 Units by mouth daily.    [provider]  cycloSPORINE (RESTASIS) 0.05 % ophthalmic emulsion Place 1 drop into both eyes 2 (two) times daily.    [provider]  diclofenac sodium (VOLTAREN) 1 % GEL Apply 2 g topically 4 (four) times daily as needed (pain).  07/30/19   [provider]  fluticasone (FLONASE) 50 MCG/ACT nasal spray Place 1 spray into both nostrils daily.    [provider]  gabapentin (NEURONTIN) 300 MG capsule Take 300 mg by  mouth 3 (three) times daily as needed (pain).    [provider]  HYDROcodone-acetaminophen (NORCO) 10-325 MG tablet Take 2 tablets by mouth every 4 (four) hours as needed for severe pain ((score 7 to 10)). 09/07/19   Meyran, Tiana Loft, NP  hydrocodone-ibuprofen (VICOPROFEN) 5-200 MG tablet Take 1 tablet by mouth every 8 (eight) hours as needed for pain. Patient not taking: Reported on 02/13/2018 09/22/16   Dairl Ponder, MD  ibuprofen (ADVIL,MOTRIN) 800 MG tablet Take 1 tablet (800 mg total) by mouth every 8 (eight) hours as needed for mild pain or moderate pain. Patient not taking: Reported on 08/24/2019 09/27/14   Trixie Dredge, PA-C  methocarbamol (ROBAXIN) 500 MG tablet Take 500 mg by mouth 2 (two) times daily as needed for muscle spasms.    [provider]  Olopatadine HCl (PAZEO) 0.7 % SOLN Place 1 drop into both eyes every morning.    [provider]  omeprazole (PRILOSEC) 20 MG capsule Take 20 mg by mouth 2 (two) times daily as needed (acid reflux).     [provider]  Pediatric Multiple Vit-C-FA (MULTIVITAMIN ANIMAL SHAPES, WITH CA/FA,) with C & FA chewable tablet Chew 1 tablet by mouth 2 (two) times daily.    [provider]  traMADol (ULTRAM) 50 MG tablet Take 1 tablet (50 mg total) by mouth every 6 (six) hours as needed. Patient not taking: Reported on 02/13/2018 07/25/16   Isa Rankin, MD    Family History Family History  Problem Relation Age of Onset  . Diabetes Father   . Hypertension Father   . Hyperlipidemia Father   . Thyroid disease Father   . Asthma Father   . Cancer Mother   . Hypertension Mother   . Thyroid disease Mother   . Cancer Sister        CERVICAL  . Thyroid disease Sister   . Asthma Sister   . Asthma Brother   . Breast cancer Neg Hx     Social History Social History   Tobacco Use  . Smoking status: Never Smoker  . Smokeless tobacco: Never Used  Substance Use Topics  . Alcohol use: Not  Currently    Comment: socially  . Drug use: No     Allergies   Shellfish allergy, Peanut-containing drug products, Sulfa antibiotics, and Percocet [oxycodone-acetaminophen]   Review of Systems Review of Systems  Constitutional: Negative for chills and fever.  HENT: Negative for congestion and sore throat.   Eyes: Negative for pain and visual  disturbance.  Respiratory: Negative for cough and shortness of breath.   Cardiovascular: Negative for chest pain and palpitations.  Gastrointestinal: Positive for abdominal pain. Negative for abdominal distention, constipation, diarrhea, nausea and vomiting.  Genitourinary: Positive for flank pain and frequency. Negative for decreased urine volume, difficulty urinating, dysuria, genital sores, hematuria, pelvic pain, urgency, vaginal bleeding, vaginal discharge and vaginal pain.  Musculoskeletal: Positive for back pain (baseline from surgery). Negative for arthralgias, joint swelling and myalgias.  Skin: Negative for color change and rash.  Neurological: Negative for seizures, syncope and headaches.  All other systems reviewed and are negative.    Physical Exam Triage Vital Signs ED Triage Vitals  Enc Vitals Group     BP 10/29/19 1123 (!) 108/55     Pulse Rate 10/29/19 1123 63     Resp 10/29/19 1123 16     Temp 10/29/19 1123 98.1 F (36.7 C)     Temp Source 10/29/19 1123 Oral     SpO2 10/29/19 1123 93 %     Weight --      Height --      Head Circumference --      Peak Flow --      Pain Score 10/29/19 1124 7     Pain Loc --      Pain Edu? --      Excl. in GC? --    No data found.  Updated Vital Signs BP (!) 108/55 (BP Location: Right Arm)   Pulse 63   Temp 98.1 F (36.7 C) (Oral)   Resp 16   SpO2 93%   Visual Acuity Right Eye Distance:   Left Eye Distance:   Bilateral Distance:    Right Eye Near:   Left Eye Near:    Bilateral Near:     Physical Exam Vitals and nursing note reviewed.  Constitutional:      General:  She is not in acute distress.    Appearance: She is well-developed. She is not ill-appearing.  HENT:     Head: Normocephalic and atraumatic.  Eyes:     Conjunctiva/sclera: Conjunctivae normal.  Cardiovascular:     Rate and Rhythm: Normal rate and regular rhythm.     Heart sounds: No murmur. No friction rub. No gallop.   Pulmonary:     Effort: Pulmonary effort is normal. No respiratory distress.     Breath sounds: Normal breath sounds. No wheezing, rhonchi or rales.  Abdominal:     General: Abdomen is flat. Bowel sounds are normal.     Palpations: Abdomen is soft. There is no hepatomegaly or mass.     Tenderness: There is abdominal tenderness (to include left flank pain) in the periumbilical area, suprapubic area and left lower quadrant. There is guarding. There is no right CVA tenderness, left CVA tenderness or rebound.     Hernia: No hernia is present.  Musculoskeletal:     Cervical back: Neck supple.  Skin:    General: Skin is warm and dry.  Neurological:     General: No focal deficit present.     Mental Status: She is alert and oriented to person, place, and time.  Psychiatric:        Mood and Affect: Mood normal.        Behavior: Behavior normal.      UC Treatments / Results  Labs (all labs ordered are listed, but only abnormal results are displayed) Labs Reviewed  POCT URINALYSIS DIP (DEVICE) - Abnormal; Notable for the following components:  Result Value   Hgb urine dipstick TRACE (*)    Leukocytes,Ua LARGE (*)    All other components within normal limits  URINE CULTURE    EKG   Radiology No results found.  Procedures Procedures (including critical care time)  Medications Ordered in UC Medications - No data to display  Initial Impression / Assessment and Plan / UC Course  I have reviewed the triage vital signs and the nursing notes.  Pertinent labs & imaging results that were available during my care of the patient were reviewed by me and considered  in my medical decision making (see chart for details).     #UTI - UTI vs stone. Mild concern for stone based on exam. UA consistent with UTI but only frequency and flank pain for symptoms. Treating with keflex and urine culture sent. Return precautions discussed if she is not improving in 3-5 days or if pain worsens or she develops fever.    Final Clinical Impressions(s) / UC Diagnoses   Final diagnoses:  Acute cystitis without hematuria     Discharge Instructions     Take the anti-biotic as prescribed.  I would like for you to limit the amount of caffeine intake for 1 week and increase your water intake.  If your symptoms are not improving or you develop fever, chills, worsening pain, decreased below your normal urine output, please be evaluated at urgent care or the Emergency Department for further work up.      ED Prescriptions    Medication Sig Dispense Auth. Provider   cephALEXin (KEFLEX) 500 MG capsule Take 1 capsule (500 mg total) by mouth 2 (two) times daily for 5 days. 10 capsule Trooper Olander, Veryl Speak, PA-C     PDMP not reviewed this encounter.   Hermelinda Medicus, PA-C 10/29/19 1323

## 2019-10-29 NOTE — Discharge Instructions (Addendum)
Take the anti-biotic as prescribed.  I would like for you to limit the amount of caffeine intake for 1 week and increase your water intake.  If your symptoms are not improving or you develop fever, chills, worsening pain, decreased below your normal urine output, please be evaluated at urgent care or the Emergency Department for further work up.

## 2019-10-29 NOTE — ED Triage Notes (Signed)
Pt presents with left side flank pain that radiates around to left lower abdominal area X 2 days.

## 2019-10-30 LAB — URINE CULTURE: Culture: <10000 — AB

## 2020-01-02 ENCOUNTER — Other Ambulatory Visit: Payer: Self-pay | Admitting: Neurological Surgery

## 2020-01-02 DIAGNOSIS — M542 Cervicalgia: Secondary | ICD-10-CM

## 2020-01-03 ENCOUNTER — Ambulatory Visit: Payer: Medicaid Other

## 2020-01-10 ENCOUNTER — Ambulatory Visit: Payer: Medicaid Other | Attending: Internal Medicine

## 2020-01-10 DIAGNOSIS — Z23 Encounter for immunization: Secondary | ICD-10-CM

## 2020-01-10 NOTE — Progress Notes (Signed)
   Covid-19 Vaccination Clinic  Name:  ATTICUS LEMBERGER    MRN: [id number] DOB: [birth year]  01/10/2020  Ms. Truxillo was observed post Covid-19 immunization for 15 minutes without incident. She was provided with Vaccine Information Sheet and instruction to access the V-Safe system.   Ms. Mceachron was instructed to call 911 with any severe reactions post vaccine: Marland Kitchen Difficulty breathing  . Swelling of face and throat  . A fast heartbeat  . A bad rash all over body  . Dizziness and weakness   Immunizations Administered    Name Date Dose VIS Date Route   Pfizer COVID-19 Vaccine 01/10/2020  9:22 AM 0.3 mL 09/28/2019 Intramuscular   Manufacturer: [employer], [employer]   Lot: [id number]   NDC: [address]-5

## 2020-01-26 ENCOUNTER — Ambulatory Visit
Admission: RE | Admit: 2020-01-26 | Discharge: 2020-01-26 | Disposition: A | Discharge status: Home / Self Care | Payer: Medicaid Other | Source: Ambulatory Visit | Attending: Neurological Surgery | Admitting: Neurological Surgery

## 2020-01-26 DIAGNOSIS — M542 Cervicalgia: Secondary | ICD-10-CM

## 2020-01-26 IMAGING — MR MR CERVICAL SPINE W/O CM
4 of 5 series · 28 of 48 positions shown · non-contrast
Comparison: Cervical spine radiographs [DATE].

CLINICAL DATA: 48-year-old female with bilateral arm pain, weakness
and tingling for 5-6 weeks.

EXAM:
MRI CERVICAL SPINE WITHOUT CONTRAST
TECHNIQUE: Multiplanar, multisequence MR imaging of the cervical spine was
performed. No intravenous contrast was administered.

[Series 3: tir sag · sagittal · 3.0mm · 0.41mm/px · 6 of 13 slices shown]
[im 1/13]
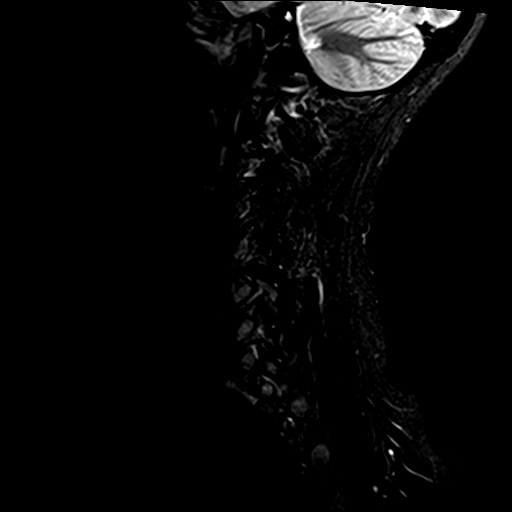
[im 3/13]
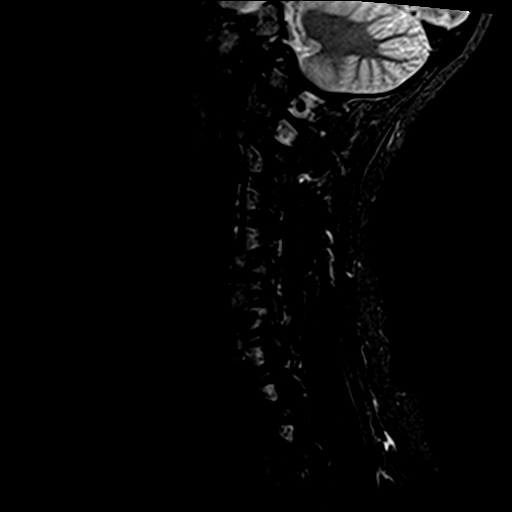
[im 5/13]
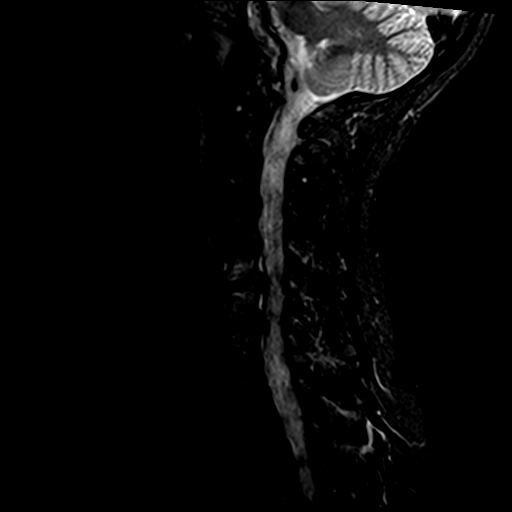
[im 8/13]
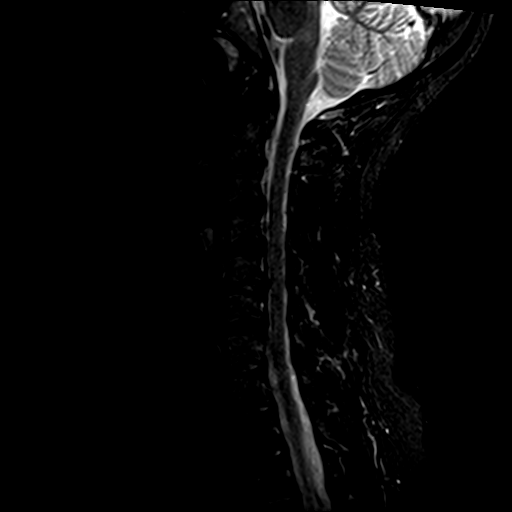
[im 10/13]
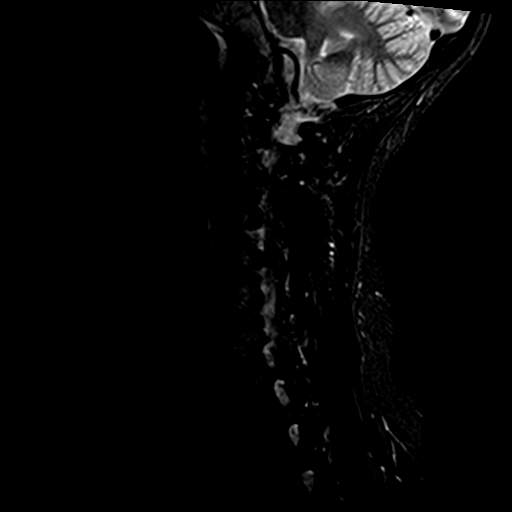
[im 13/13]
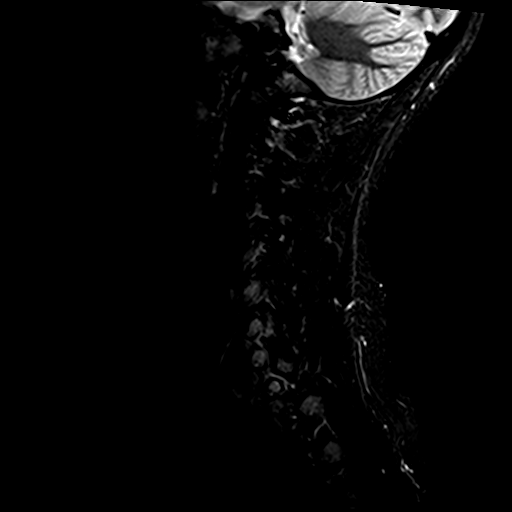

[Series 4: T1 · sagittal · 3.0mm · 0.41mm/px · 7 of 13 slices shown]
[im 1/13]
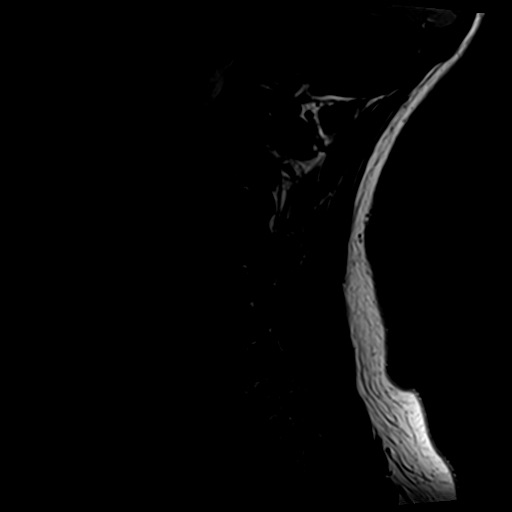
[im 3/13]
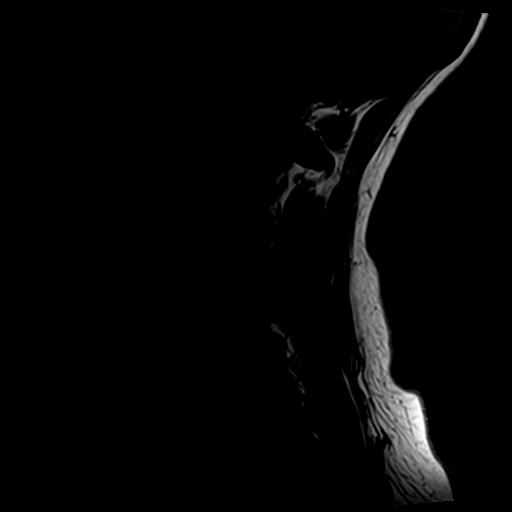
[im 5/13]
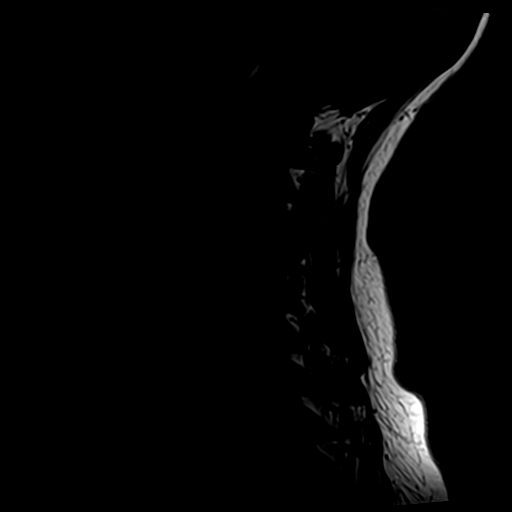
[im 7/13]
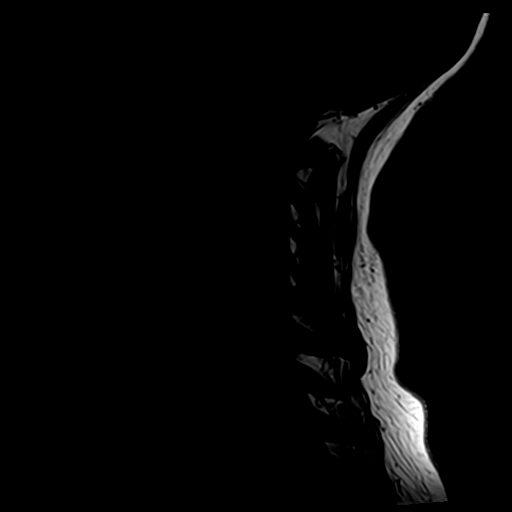
[im 9/13]
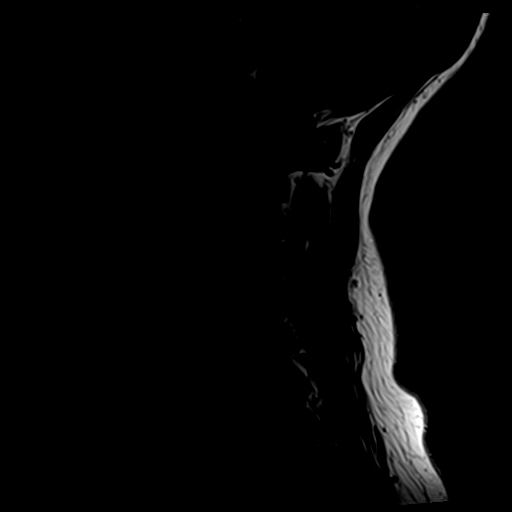
[im 11/13]
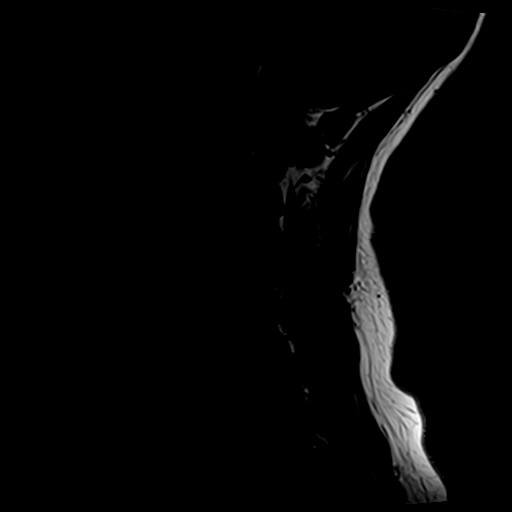
[im 13/13]
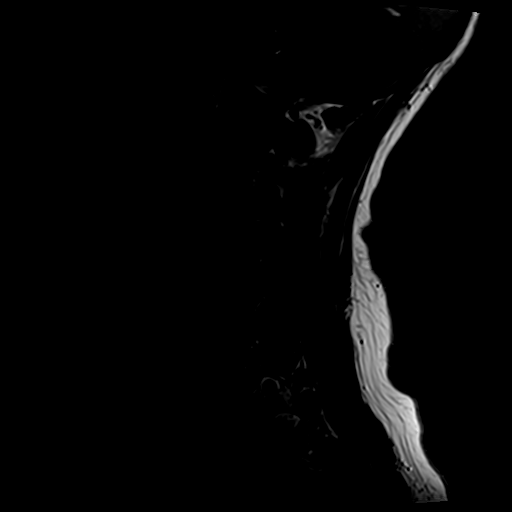

[Series 5: T2 · sagittal · 3.0mm · 0.66mm/px · 7 of 13 slices shown (1 of 2)]
[im 1/13]
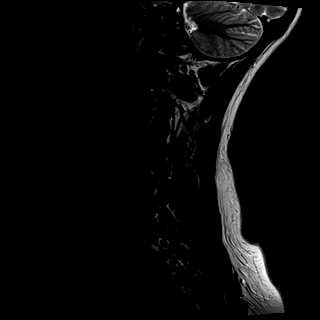
[im 3/13]
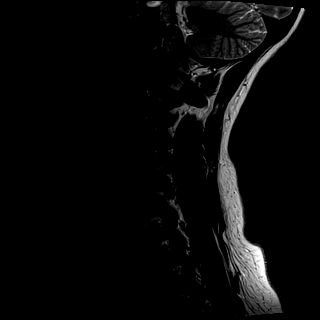
[im 5/13]
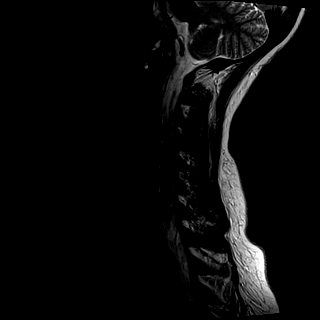
[im 7/13]
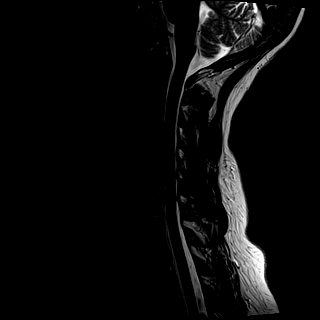
[im 9/13]
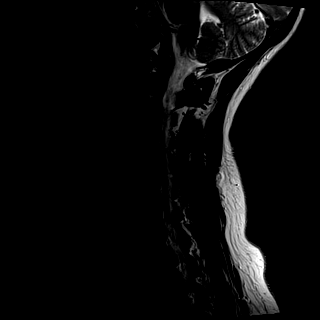
[im 11/13]
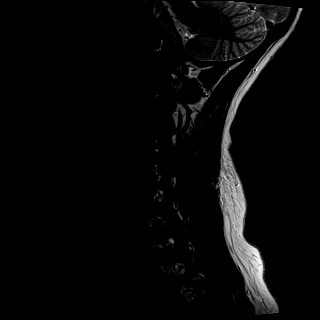
[im 13/13]
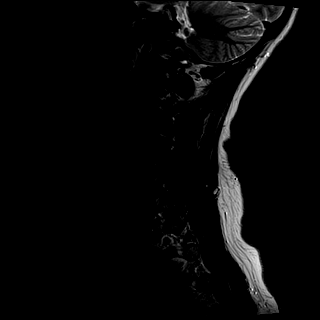

[Series 7: T2 · axial · 3.0mm · 0.70mm/px · z∈[-32,+60]mm · 8 of 28 slices shown (2 of 2)]
[im 1/28]
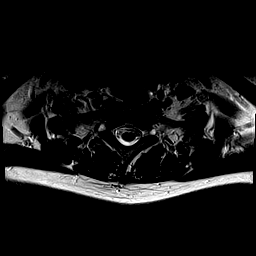
[im 5/28]
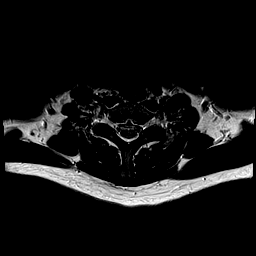
[im 9/28]
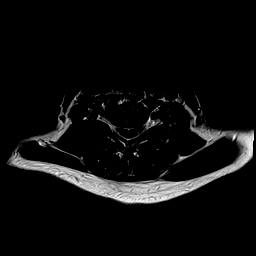
[im 13/28]
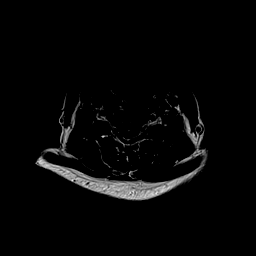
[im 15/28]
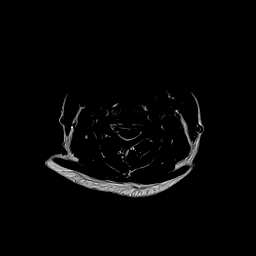
[im 19/28]
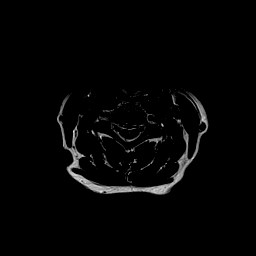
[im 23/28]
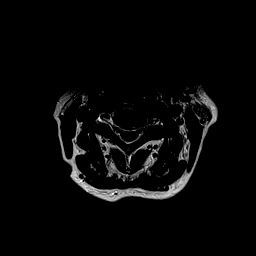
[im 28/28]
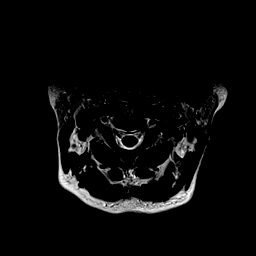

[28 of 48 positions shown; findings below may reference images not displayed]

FINDINGS: Alignment: Chronic straightening of cervical lordosis. No
spondylolisthesis.

Vertebrae: Faint degenerative endplate marrow edema eccentric to the
right at C5-C6. No other acute osseous abnormality identified.
Background bone marrow signal within normal limits.

Cord: Spinal cord signal is within normal limits at all visualized
levels.

Posterior Fossa, vertebral arteries, paraspinal tissues:
Cervicomedullary junction is within normal limits. Negative visible
posterior fossa. Preserved major vascular flow voids in the neck.
Negative visible neck soft tissues, lung apices.

Disc levels:

There is a degree of congenital spinal canal narrowing due to short
pedicle distance, with the following superimposed degenerative
changes:

C2-C3: Small central disc protrusion or thickening of the posterior
longitudinal ligament. No significant stenosis.

C3-C4: Mild disc bulge and facet hypertrophy. Borderline to mild
spinal stenosis.

C4-C5: Circumferential disc bulge and mild facet hypertrophy. Mild
spinal stenosis, mild if any cord mass effect.

C5-C6: Circumferential disc bulge and endplate spurring. Broad-based
posterior component with mild facet hypertrophy. Mild spinal
stenosis and spinal cord mass effect. Moderate right and mild left
C6 foraminal stenosis.

C6-C7: Circumferential disc bulge with endplate spurring. Mild
ligament flavum hypertrophy. Mild spinal stenosis. Mild if any cord
mass effect. Mild right C7 foraminal stenosis.

C7-T1: Congenital spinal canal narrowing abates at this level. There
is mild to moderate facet hypertrophy greater on the left. No
significant stenosis.

No upper thoracic stenosis.
IMPRESSION: 1. Combined congenital and acquired cervical spinal stenosis from
C3-C4 to C6-C7. Mild spinal cord mass effect primarily at C5-C6. No
cord signal abnormality.
2. Moderate right C6 neural foraminal stenosis.

## 2020-01-30 ENCOUNTER — Other Ambulatory Visit: Payer: Self-pay | Admitting: Neurological Surgery

## 2020-02-04 ENCOUNTER — Ambulatory Visit: Payer: Medicaid Other | Attending: Internal Medicine

## 2020-02-04 DIAGNOSIS — Z23 Encounter for immunization: Secondary | ICD-10-CM

## 2020-02-04 NOTE — Progress Notes (Signed)
   Covid-19 Vaccination Clinic  Name:  Bridget Stevens    MRN: [id number] DOB: Apr 19, 1971  02/04/2020  [name] was observed post Covid-19 immunization for 15 minutes without incident. She was provided with Vaccine Information Sheet and instruction to access the V-Safe system.   [name] was instructed to call 911 with any severe reactions post vaccine: Marland Kitchen Difficulty breathing  . Swelling of face and throat  . A fast heartbeat  . A bad rash all over body  . Dizziness and weakness   Immunizations Administered    Name Date Dose VIS Date Route   Pfizer COVID-19 Vaccine 02/04/2020  9:06 AM 0.3 mL 12/12/2018 Intramuscular   Manufacturer: [employer], [employer]   Lot: [id number]   NDC: [address]-4

## 2020-02-13 NOTE — Progress Notes (Signed)
[id number] [place], [place] - [address] AT [facility] [address] [place] [address] Phone: [phone] Fax: [phone]      Your procedure is scheduled on May 3  Report to [facility] Main Entrance "A" at 0530 A.M., and check in at the Admitting office.  Call this number if you have problems the morning of surgery:  [phone]  Call [phone] if you have any questions prior to your surgery date Monday-Friday 8am-4pm    Remember:  Do not eat or drink after midnight the night before your surgery     Take these medicines the morning of surgery with A SIP OF WATER  acetaminophen (TYLENOL) if needed albuterol (PROVENTIL HFA;VENTOLIN HFA) Please bring all inhalers with you the day of surgery.  fluticasone (FLONASE) gabapentin (NEURONTIN) Eye drops if needed omeprazole (PRILOSEC)  oxyCODONE-acetaminophen (PERCOCET/ROXICET)  If needed   As of today, STOP taking any Aspirin (unless otherwise instructed by your surgeon) and Aspirin containing products, Aleve, Naproxen, Ibuprofen, Motrin, Advil, Goody's, BC's, all herbal medications, fish oil, and all vitamins.                      Do not wear jewelry, make up, or nail polish            Do not wear lotions, powders, perfumes/colognes, or deodorant.            Do not shave 48 hours prior to surgery.  Men may shave face and neck.            Do not bring valuables to the hospital.            [facility] is not responsible for any belongings or valuables.  Do NOT Smoke (Tobacco/Vapping) or drink Alcohol 24 hours prior to your procedure If you use a CPAP at night, you may bring all equipment for your overnight stay.   Contacts, glasses, dentures or bridgework may not be worn into surgery.      For patients admitted to the hospital, discharge time will be determined by your treatment team.   Patients discharged the day of surgery will not be allowed to drive home, and  someone needs to stay with them for 24 hours.    Special instructions:   [facility]- Preparing For Surgery  Before surgery, you can play an important role. Because skin is not sterile, your skin needs to be as free of germs as possible. You can reduce the number of germs on your skin by washing with CHG (chlorahexidine gluconate) Soap before surgery.  CHG is an antiseptic cleaner which kills germs and bonds with the skin to continue killing germs even after washing.    Oral Hygiene is also important to reduce your risk of infection.  Remember - BRUSH YOUR TEETH THE MORNING OF SURGERY WITH YOUR REGULAR TOOTHPASTE  Please do not use if you have an allergy to CHG or antibacterial soaps. If your skin becomes reddened/irritated stop using the CHG.  Do not shave (including legs and underarms) for at least 48 hours prior to first CHG shower. It is OK to shave your face.  Please follow these instructions carefully.   1. Shower the NIGHT BEFORE SURGERY and the MORNING OF SURGERY with CHG Soap.   2. If you chose to wash your hair, wash your hair first as usual with your normal shampoo.  3. After you shampoo, rinse your hair and body thoroughly to  remove the shampoo.  4. Use CHG as you would any other liquid soap. You can apply CHG directly to the skin and wash gently with a scrungie or a clean washcloth.   5. Apply the CHG Soap to your body ONLY FROM THE NECK DOWN.  Do not use on open wounds or open sores. Avoid contact with your eyes, ears, mouth and genitals (private parts). Wash Face and genitals (private parts)  with your normal soap.   6. Wash thoroughly, paying special attention to the area where your surgery will be performed.  7. Thoroughly rinse your body with warm water from the neck down.  8. DO NOT shower/wash with your normal soap after using and rinsing off the CHG Soap.  9. Pat yourself dry with a CLEAN TOWEL.  10. Wear CLEAN PAJAMAS to bed the night before surgery, wear  comfortable clothes the morning of surgery  11. Place CLEAN SHEETS on your bed the night of your first shower and DO NOT SLEEP WITH PETS.   Day of Surgery:   Do not apply any deodorants/lotions.  Please wear clean clothes to the hospital/surgery center.   Remember to brush your teeth WITH YOUR REGULAR TOOTHPASTE.   Please read over the following fact sheets that you were given.

## 2020-02-14 ENCOUNTER — Encounter (HOSPITAL_COMMUNITY): Payer: Self-pay

## 2020-02-14 ENCOUNTER — Other Ambulatory Visit (HOSPITAL_COMMUNITY)
Admission: RE | Admit: 2020-02-14 | Discharge: 2020-02-14 | Disposition: A | Discharge status: Home / Self Care | Payer: Medicaid Other | Source: Ambulatory Visit | Attending: Neurological Surgery | Admitting: Neurological Surgery

## 2020-02-14 ENCOUNTER — Encounter (HOSPITAL_COMMUNITY)
Admission: RE | Admit: 2020-02-14 | Discharge: 2020-02-14 | Disposition: A | Discharge status: Home / Self Care | Payer: Medicaid Other | Source: Ambulatory Visit | Attending: Neurological Surgery | Admitting: Neurological Surgery

## 2020-02-14 ENCOUNTER — Other Ambulatory Visit: Payer: Self-pay

## 2020-02-14 DIAGNOSIS — Z01812 Encounter for preprocedural laboratory examination: Secondary | ICD-10-CM | POA: Diagnosis not present

## 2020-02-14 DIAGNOSIS — Z20822 Contact with and (suspected) exposure to covid-19: Secondary | ICD-10-CM | POA: Diagnosis not present

## 2020-02-14 LAB — SARS CORONAVIRUS 2 (TAT 6-24 HRS): SARS Coronavirus 2: NEGATIVE

## 2020-02-14 LAB — CBC WITH DIFFERENTIAL/PLATELET
Abs Immature Granulocytes: 0.01 10*3/uL (ref 0.00–0.07)
Basophils Absolute: 0 10*3/uL (ref 0.0–0.1)
Basophils Relative: 1 %
Eosinophils Absolute: 0.1 10*3/uL (ref 0.0–0.5)
Eosinophils Relative: 2 %
HCT: 42.3 % (ref 36.0–46.0)
Hemoglobin: 13.8 g/dL (ref 12.0–15.0)
Immature Granulocytes: 0 %
Lymphocytes Relative: 40 %
Lymphs Abs: 2.1 10*3/uL (ref 0.7–4.0)
MCH: 30.9 pg (ref 26.0–34.0)
MCHC: 32.6 g/dL (ref 30.0–36.0)
MCV: 94.8 fL (ref 80.0–100.0)
Monocytes Absolute: 0.4 10*3/uL (ref 0.1–1.0)
Monocytes Relative: 8 %
Neutro Abs: 2.5 10*3/uL (ref 1.7–7.7)
Neutrophils Relative %: 49 %
Platelets: 235 10*3/uL (ref 150–400)
RBC: 4.46 MIL/uL (ref 3.87–5.11)
RDW: 13.3 % (ref 11.5–15.5)
WBC: 5.1 10*3/uL (ref 4.0–10.5)
nRBC: 0 % (ref 0.0–0.2)

## 2020-02-14 LAB — BASIC METABOLIC PANEL
Anion gap: 11 (ref 5–15)
BUN: 13 mg/dL (ref 6–20)
CO2: 27 mmol/L (ref 22–32)
Calcium: 9.5 mg/dL (ref 8.9–10.3)
Chloride: 103 mmol/L (ref 98–111)
Creatinine, Ser: 0.71 mg/dL (ref 0.44–1.00)
GFR calc Af Amer: >60 mL/min (ref >60)
GFR calc non Af Amer: >60 mL/min (ref >60)
Glucose, Bld: 91 mg/dL (ref 70–99)
Potassium: 4.2 mmol/L (ref 3.5–5.1)
Sodium: 141 mmol/L (ref 135–145)

## 2020-02-14 LAB — PROTIME-INR
INR: 1.1 (ref 0.8–1.2)
Prothrombin Time: 13.3 seconds (ref 11.4–15.2)

## 2020-02-14 LAB — TYPE AND SCREEN
ABO/RH(D): O POS
Antibody Screen: NEGATIVE

## 2020-02-14 LAB — SURGICAL PCR SCREEN
MRSA, PCR: NEGATIVE
Staphylococcus aureus: NEGATIVE

## 2020-02-14 NOTE — Progress Notes (Signed)
PCP - Fleet Contras Cardiologist - denies  Chest x-ray - 09/02/20 EKG - 09/03/19 Stress Test - denies ECHO - denies Cardiac Cath - denies   COVID TEST- 02/14/20    Anesthesia review:  NO  Patient denies shortness of breath, fever, cough and chest pain at PAT appointment   All instructions explained to the patient, with a verbal understanding of the material. Patient agrees to go over the instructions while at home for a better understanding. Patient also instructed to self quarantine after being tested for COVID-19. The opportunity to ask questions was provided.

## 2020-02-17 NOTE — Anesthesia Preprocedure Evaluation (Addendum)
Anesthesia Evaluation  Patient identified by MRN, date of birth, ID band Patient awake    Reviewed: Allergy & Precautions, NPO status , Patient's Chart, lab work & pertinent test results  History of Anesthesia Complications (+) AWARENESS UNDER ANESTHESIANegative for: history of anesthetic complications  Airway Mallampati: II  TM Distance: >3 FB Neck ROM: Full    Dental no notable dental hx. (+) Dental Advisory Given   Pulmonary asthma , neg recent URI,    Pulmonary exam normal        Cardiovascular negative cardio ROS Normal cardiovascular exam     Neuro/Psych  Headaches, PSYCHIATRIC DISORDERS Anxiety    GI/Hepatic Neg liver ROS, GERD  Medicated and Controlled,  Endo/Other  negative endocrine ROS  Renal/GU negative Renal ROS     Musculoskeletal  (+) Arthritis ,   Abdominal   Peds  Hematology negative hematology ROS (+)   Anesthesia Other Findings   Reproductive/Obstetrics                            Anesthesia Physical  Anesthesia Plan  ASA: II  Anesthesia Plan: General   Post-op Pain Management:    Induction: Intravenous  PONV Risk Score and Plan: 3 and 4 or greater and Ondansetron, Dexamethasone, Scopolamine patch - Pre-op and Midazolam  Airway Management Planned: Oral ETT  Additional Equipment: None  Intra-op Plan:   Post-operative Plan: Extubation in OR  Informed Consent: I have reviewed the patients History and Physical, chart, labs and discussed the procedure including the risks, benefits and alternatives for the proposed anesthesia with the patient or authorized representative who has indicated his/her understanding and acceptance.     Dental advisory given  Plan Discussed with: Anesthesiologist  Anesthesia Plan Comments:        Anesthesia Quick Evaluation

## 2020-02-18 ENCOUNTER — Ambulatory Visit (HOSPITAL_COMMUNITY): Payer: Medicaid Other | Admitting: Anesthesiology

## 2020-02-18 ENCOUNTER — Encounter (HOSPITAL_COMMUNITY): Payer: Self-pay | Admitting: Neurological Surgery

## 2020-02-18 ENCOUNTER — Encounter (HOSPITAL_COMMUNITY)
Admission: RE | Disposition: A | Discharge status: Home / Self Care | Payer: Self-pay | Source: Home / Self Care | Attending: Neurological Surgery

## 2020-02-18 ENCOUNTER — Other Ambulatory Visit: Payer: Self-pay

## 2020-02-18 ENCOUNTER — Observation Stay (HOSPITAL_COMMUNITY)
Admission: RE | Admit: 2020-02-18 | Discharge: 2020-02-19 | Disposition: A | Discharge status: Home / Self Care | Payer: Medicaid Other | Attending: Neurological Surgery | Admitting: Neurological Surgery

## 2020-02-18 ENCOUNTER — Ambulatory Visit (HOSPITAL_COMMUNITY): Payer: Medicaid Other

## 2020-02-18 DIAGNOSIS — Z79899 Other long term (current) drug therapy: Secondary | ICD-10-CM | POA: Diagnosis not present

## 2020-02-18 DIAGNOSIS — Z886 Allergy status to analgesic agent status: Secondary | ICD-10-CM | POA: Diagnosis not present

## 2020-02-18 DIAGNOSIS — K219 Gastro-esophageal reflux disease without esophagitis: Secondary | ICD-10-CM | POA: Insufficient documentation

## 2020-02-18 DIAGNOSIS — M50222 Other cervical disc displacement at C5-C6 level: Secondary | ICD-10-CM | POA: Diagnosis not present

## 2020-02-18 DIAGNOSIS — Z981 Arthrodesis status: Secondary | ICD-10-CM

## 2020-02-18 DIAGNOSIS — Z87892 Personal history of anaphylaxis: Secondary | ICD-10-CM | POA: Diagnosis not present

## 2020-02-18 DIAGNOSIS — J45909 Unspecified asthma, uncomplicated: Secondary | ICD-10-CM | POA: Diagnosis not present

## 2020-02-18 DIAGNOSIS — Z885 Allergy status to narcotic agent status: Secondary | ICD-10-CM | POA: Diagnosis not present

## 2020-02-18 DIAGNOSIS — Z91013 Allergy to seafood: Secondary | ICD-10-CM | POA: Diagnosis not present

## 2020-02-18 DIAGNOSIS — Z882 Allergy status to sulfonamides status: Secondary | ICD-10-CM | POA: Insufficient documentation

## 2020-02-18 DIAGNOSIS — Z91010 Allergy to peanuts: Secondary | ICD-10-CM | POA: Insufficient documentation

## 2020-02-18 DIAGNOSIS — Z419 Encounter for procedure for purposes other than remedying health state, unspecified: Secondary | ICD-10-CM

## 2020-02-18 DIAGNOSIS — M4802 Spinal stenosis, cervical region: Secondary | ICD-10-CM | POA: Insufficient documentation

## 2020-02-18 DIAGNOSIS — M47812 Spondylosis without myelopathy or radiculopathy, cervical region: Principal | ICD-10-CM | POA: Insufficient documentation

## 2020-02-18 HISTORY — PX: ANTERIOR CERVICAL DECOMP/DISCECTOMY FUSION: SHX1161

## 2020-02-18 LAB — POCT PREGNANCY, URINE: Preg Test, Ur: NEGATIVE

## 2020-02-18 IMAGING — RF DG CERVICAL SPINE 1V
1 series · 1 of 1 positions shown · non-contrast
Comparison: [DATE].

CLINICAL DATA: C5-6 ACDF.

EXAM:
DG CERVICAL SPINE - 1 VIEW

[Series 1: run · 1 of 1 slices shown]
[im 1/1]
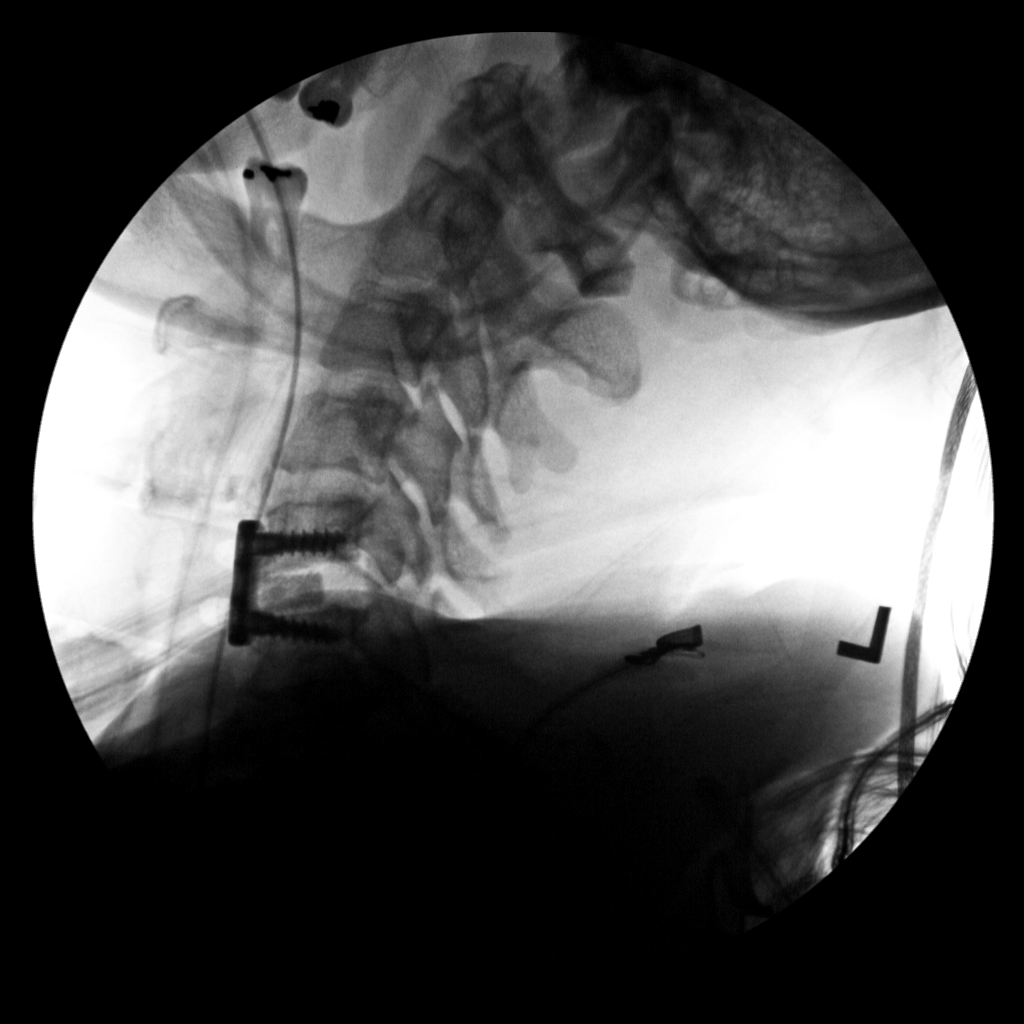

[1 of 1 positions shown; findings below may reference images not displayed]

FINDINGS: Single intraoperative cross-table lateral view of the cervical spine
shows C5-6 anterior cervical fusion with an interbody strut.
IMPRESSION: C5-6 anterior cervical fusion.

## 2020-02-18 IMAGING — RF DG C-ARM 1-60 MIN
1 series · 1 of 1 positions shown · non-contrast
Comparison: None.

CLINICAL DATA: Open reduction internal fixation

EXAM:
DG C-ARM 1-60 MIN
FLUOROSCOPY TIME:  Fluoroscopy Time: 0 minutes 7 seconds
Number of Acquired Spot Images: 1

[Series 1: run · 1 of 1 slices shown]
[im 1/1]
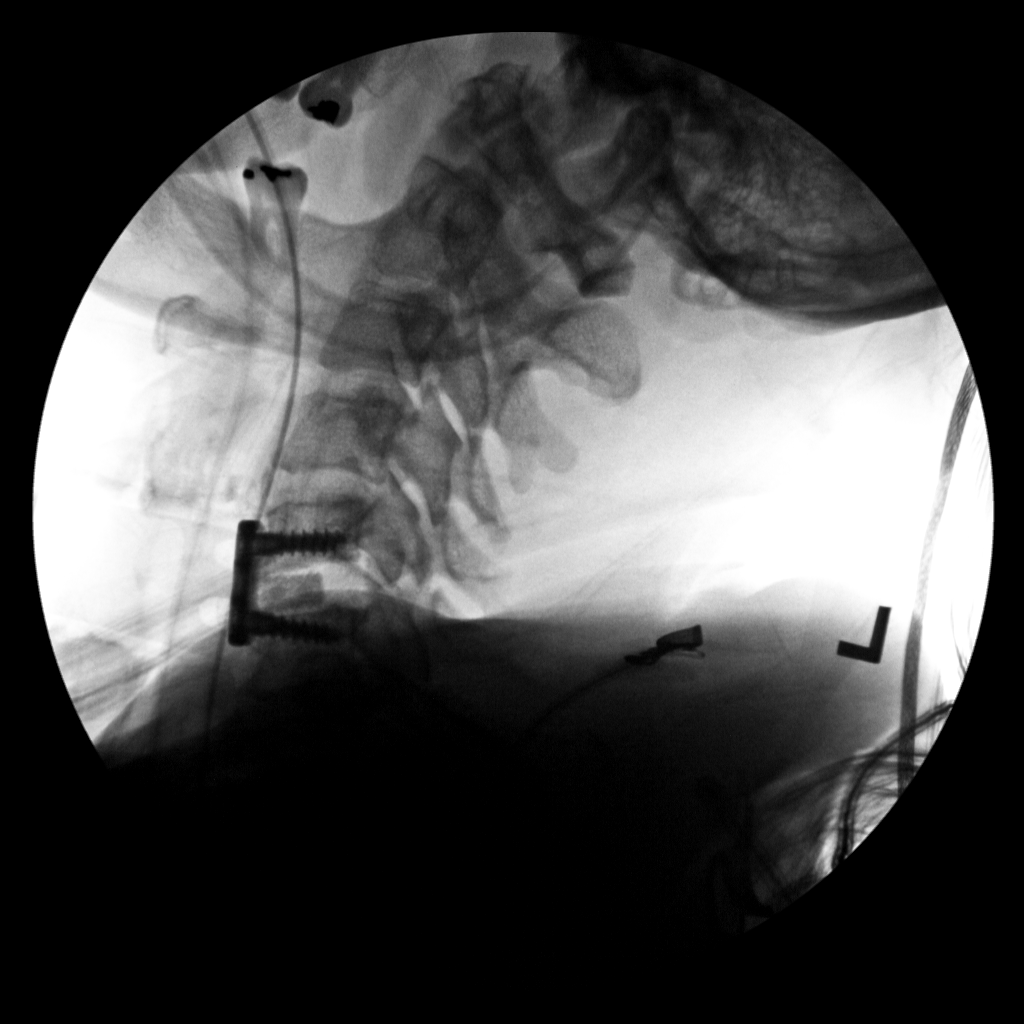

[1 of 1 positions shown; findings below may reference images not displayed]

FINDINGS: Cross-table lateral cervical image obtained. There is anterior screw
and plate fixation at C5 and C6 with a disc spacer at C5-6. No
fracture or spondylolisthesis. There is moderate disc space
narrowing at C6-7. Other disc spaces appear unremarkable
IMPRESSION: Anterior fusion at C5 and C6 with disc spacer at C5-6. Support
hardware intact. Disc space narrowing noted at C6-7. No fracture or
spondylolisthesis.

## 2020-02-18 SURGERY — ANTERIOR CERVICAL DECOMPRESSION/DISCECTOMY FUSION 1 LEVEL
Anesthesia: General | Site: Neck

## 2020-02-18 MED ORDER — BUPIVACAINE HCL (PF) 0.25 % IJ SOLN
INTRAMUSCULAR | Status: AC
  Filled 2020-02-18: qty 30

## 2020-02-18 MED ORDER — SENNA 8.6 MG PO TABS
1.0000 | ORAL_TABLET | Freq: Two times a day (BID) | ORAL | Status: DC
  Administered 2020-02-18: 8.6 mg via ORAL
  Filled 2020-02-18: qty 1

## 2020-02-18 MED ORDER — SODIUM CHLORIDE 0.9% FLUSH
3.0000 mL | Freq: Two times a day (BID) | INTRAVENOUS | Status: DC
  Administered 2020-02-18: 3 mL via INTRAVENOUS

## 2020-02-18 MED ORDER — FENTANYL CITRATE (PF) 250 MCG/5ML IJ SOLN
INTRAMUSCULAR | Status: DC | PRN
  Administered 2020-02-18 (×2): 50 ug via INTRAVENOUS
  Administered 2020-02-18: 100 ug via INTRAVENOUS

## 2020-02-18 MED ORDER — SODIUM CHLORIDE 0.9 % IV SOLN: 250.0000 mL | INTRAVENOUS | Status: DC

## 2020-02-18 MED ORDER — MENTHOL 3 MG MT LOZG: 1.0000 | LOZENGE | OROMUCOSAL | Status: DC | PRN

## 2020-02-18 MED ORDER — ACETAMINOPHEN 650 MG RE SUPP: 650.0000 mg | RECTAL | Status: DC | PRN

## 2020-02-18 MED ORDER — OXYCODONE-ACETAMINOPHEN 5-325 MG PO TABS
1.0000 | ORAL_TABLET | ORAL | Status: DC | PRN
  Administered 2020-02-18 – 2020-02-19 (×5): 1 via ORAL
  Filled 2020-02-18 (×4): qty 1

## 2020-02-18 MED ORDER — ALBUTEROL SULFATE HFA 108 (90 BASE) MCG/ACT IN AERS: 2.0000 | INHALATION_SPRAY | RESPIRATORY_TRACT | Status: DC | PRN

## 2020-02-18 MED ORDER — ONDANSETRON HCL 4 MG/2ML IJ SOLN
INTRAMUSCULAR | Status: DC | PRN
  Administered 2020-02-18: 4 mg via INTRAVENOUS

## 2020-02-18 MED ORDER — METHOCARBAMOL 500 MG PO TABS
500.0000 mg | ORAL_TABLET | Freq: Four times a day (QID) | ORAL | Status: DC | PRN
  Administered 2020-02-18 – 2020-02-19 (×3): 500 mg via ORAL
  Filled 2020-02-18 (×2): qty 1

## 2020-02-18 MED ORDER — OLOPATADINE HCL 0.1 % OP SOLN
1.0000 [drp] | Freq: Every day | OPHTHALMIC | Status: DC
  Filled 2020-02-18: qty 5

## 2020-02-18 MED ORDER — PROPOFOL 10 MG/ML IV BOLUS
INTRAVENOUS | Status: AC
  Filled 2020-02-18: qty 40

## 2020-02-18 MED ORDER — FENTANYL CITRATE (PF) 250 MCG/5ML IJ SOLN
INTRAMUSCULAR | Status: AC
  Filled 2020-02-18: qty 5

## 2020-02-18 MED ORDER — CHLORHEXIDINE GLUCONATE CLOTH 2 % EX PADS: 6.0000 | MEDICATED_PAD | Freq: Once | CUTANEOUS | Status: DC

## 2020-02-18 MED ORDER — FENTANYL CITRATE (PF) 100 MCG/2ML IJ SOLN
INTRAMUSCULAR | Status: AC
  Filled 2020-02-18: qty 2

## 2020-02-18 MED ORDER — GLYCOPYRROLATE PF 0.2 MG/ML IJ SOSY
PREFILLED_SYRINGE | INTRAMUSCULAR | Status: AC
  Filled 2020-02-18: qty 1

## 2020-02-18 MED ORDER — ONDANSETRON HCL 4 MG/2ML IJ SOLN
INTRAMUSCULAR | Status: AC
  Filled 2020-02-18: qty 2

## 2020-02-18 MED ORDER — CEFAZOLIN SODIUM-DEXTROSE 2-4 GM/100ML-% IV SOLN
2.0000 g | Freq: Three times a day (TID) | INTRAVENOUS | Status: AC
  Administered 2020-02-18 (×2): 2 g via INTRAVENOUS
  Filled 2020-02-18 (×2): qty 100

## 2020-02-18 MED ORDER — LIDOCAINE 2% (20 MG/ML) 5 ML SYRINGE
INTRAMUSCULAR | Status: DC | PRN
  Administered 2020-02-18: 60 mg via INTRAVENOUS

## 2020-02-18 MED ORDER — DEXAMETHASONE SODIUM PHOSPHATE 10 MG/ML IJ SOLN
10.0000 mg | Freq: Once | INTRAMUSCULAR | Status: AC
  Administered 2020-02-18: 10 mg via INTRAVENOUS
  Filled 2020-02-18: qty 1

## 2020-02-18 MED ORDER — GABAPENTIN 300 MG PO CAPS
300.0000 mg | ORAL_CAPSULE | Freq: Three times a day (TID) | ORAL | Status: DC
  Administered 2020-02-18 (×2): 300 mg via ORAL
  Filled 2020-02-18 (×2): qty 1

## 2020-02-18 MED ORDER — SODIUM CHLORIDE 0.9% FLUSH: 3.0000 mL | INTRAVENOUS | Status: DC | PRN

## 2020-02-18 MED ORDER — EPHEDRINE 5 MG/ML INJ
INTRAVENOUS | Status: AC
  Filled 2020-02-18: qty 10

## 2020-02-18 MED ORDER — ONDANSETRON HCL 4 MG/2ML IJ SOLN: 4.0000 mg | Freq: Four times a day (QID) | INTRAMUSCULAR | Status: DC | PRN

## 2020-02-18 MED ORDER — MIDAZOLAM HCL 2 MG/2ML IJ SOLN
INTRAMUSCULAR | Status: AC
  Filled 2020-02-18: qty 2

## 2020-02-18 MED ORDER — GLYCOPYRROLATE PF 0.2 MG/ML IJ SOSY
PREFILLED_SYRINGE | INTRAMUSCULAR | Status: DC | PRN
  Administered 2020-02-18: .2 mg via INTRAVENOUS

## 2020-02-18 MED ORDER — MIDAZOLAM HCL 5 MG/5ML IJ SOLN
INTRAMUSCULAR | Status: DC | PRN
  Administered 2020-02-18: 2 mg via INTRAVENOUS

## 2020-02-18 MED ORDER — 0.9 % SODIUM CHLORIDE (POUR BTL) OPTIME
TOPICAL | Status: DC | PRN
  Administered 2020-02-18: 1000 mL

## 2020-02-18 MED ORDER — SCOPOLAMINE 1 MG/3DAYS TD PT72
1.0000 | MEDICATED_PATCH | TRANSDERMAL | Status: DC
  Administered 2020-02-18: 1.5 mg via TRANSDERMAL
  Filled 2020-02-18: qty 1

## 2020-02-18 MED ORDER — POTASSIUM CHLORIDE IN NACL 20-0.9 MEQ/L-% IV SOLN: INTRAVENOUS | Status: DC

## 2020-02-18 MED ORDER — ACETAMINOPHEN 325 MG PO TABS: 650.0000 mg | ORAL_TABLET | ORAL | Status: DC | PRN

## 2020-02-18 MED ORDER — SODIUM CHLORIDE 0.9 % IV SOLN: INTRAVENOUS | Status: DC | PRN

## 2020-02-18 MED ORDER — ACETAMINOPHEN 500 MG PO TABS
1000.0000 mg | ORAL_TABLET | Freq: Once | ORAL | Status: AC
  Administered 2020-02-18: 1000 mg via ORAL
  Filled 2020-02-18: qty 2

## 2020-02-18 MED ORDER — OXYCODONE-ACETAMINOPHEN 5-325 MG PO TABS
ORAL_TABLET | ORAL | Status: AC
  Filled 2020-02-18: qty 1

## 2020-02-18 MED ORDER — DEXAMETHASONE 4 MG PO TABS
4.0000 mg | ORAL_TABLET | Freq: Four times a day (QID) | ORAL | Status: DC
  Administered 2020-02-18 – 2020-02-19 (×3): 4 mg via ORAL
  Filled 2020-02-18 (×3): qty 1

## 2020-02-18 MED ORDER — CELECOXIB 200 MG PO CAPS
200.0000 mg | ORAL_CAPSULE | Freq: Once | ORAL | Status: AC
  Administered 2020-02-18: 200 mg via ORAL
  Filled 2020-02-18: qty 1

## 2020-02-18 MED ORDER — PROMETHAZINE HCL 25 MG/ML IJ SOLN: 6.2500 mg | INTRAMUSCULAR | Status: DC | PRN

## 2020-02-18 MED ORDER — ALBUTEROL SULFATE (2.5 MG/3ML) 0.083% IN NEBU: 2.5000 mg | INHALATION_SOLUTION | RESPIRATORY_TRACT | Status: DC | PRN

## 2020-02-18 MED ORDER — PROPOFOL 10 MG/ML IV BOLUS
INTRAVENOUS | Status: DC | PRN
  Administered 2020-02-18: 140 mg via INTRAVENOUS

## 2020-02-18 MED ORDER — FENTANYL CITRATE (PF) 100 MCG/2ML IJ SOLN
25.0000 ug | INTRAMUSCULAR | Status: DC | PRN
  Administered 2020-02-18: 50 ug via INTRAVENOUS

## 2020-02-18 MED ORDER — DEXAMETHASONE SODIUM PHOSPHATE 4 MG/ML IJ SOLN
4.0000 mg | Freq: Four times a day (QID) | INTRAMUSCULAR | Status: DC
  Administered 2020-02-18: 4 mg via INTRAVENOUS
  Filled 2020-02-18: qty 1

## 2020-02-18 MED ORDER — THROMBIN 5000 UNITS EX SOLR: OROMUCOSAL | Status: DC | PRN

## 2020-02-18 MED ORDER — BUPIVACAINE HCL (PF) 0.25 % IJ SOLN
INTRAMUSCULAR | Status: DC | PRN
  Administered 2020-02-18: 3 mL

## 2020-02-18 MED ORDER — MORPHINE SULFATE (PF) 2 MG/ML IV SOLN
2.0000 mg | INTRAVENOUS | Status: DC | PRN
  Administered 2020-02-18: 2 mg via INTRAVENOUS
  Filled 2020-02-18: qty 1

## 2020-02-18 MED ORDER — ROCURONIUM BROMIDE 10 MG/ML (PF) SYRINGE
PREFILLED_SYRINGE | INTRAVENOUS | Status: DC | PRN
  Administered 2020-02-18: 20 mg via INTRAVENOUS
  Administered 2020-02-18: 50 mg via INTRAVENOUS

## 2020-02-18 MED ORDER — PHENYLEPHRINE HCL-NACL 10-0.9 MG/250ML-% IV SOLN
INTRAVENOUS | Status: DC | PRN
  Administered 2020-02-18: 20 ug/min via INTRAVENOUS

## 2020-02-18 MED ORDER — LIDOCAINE 2% (20 MG/ML) 5 ML SYRINGE
INTRAMUSCULAR | Status: AC
  Filled 2020-02-18: qty 5

## 2020-02-18 MED ORDER — METHOCARBAMOL 500 MG PO TABS
ORAL_TABLET | ORAL | Status: AC
  Filled 2020-02-18: qty 1

## 2020-02-18 MED ORDER — PHENOL 1.4 % MT LIQD: 1.0000 | OROMUCOSAL | Status: DC | PRN

## 2020-02-18 MED ORDER — EPHEDRINE SULFATE-NACL 50-0.9 MG/10ML-% IV SOSY
PREFILLED_SYRINGE | INTRAVENOUS | Status: DC | PRN
  Administered 2020-02-18: 10 mg via INTRAVENOUS

## 2020-02-18 MED ORDER — LACTATED RINGERS IV SOLN: INTRAVENOUS | Status: DC

## 2020-02-18 MED ORDER — CEFAZOLIN SODIUM-DEXTROSE 2-4 GM/100ML-% IV SOLN
2.0000 g | INTRAVENOUS | Status: AC
  Administered 2020-02-18: 2 g via INTRAVENOUS
  Filled 2020-02-18: qty 100

## 2020-02-18 MED ORDER — THROMBIN 5000 UNITS EX SOLR
CUTANEOUS | Status: AC
  Filled 2020-02-18: qty 15000

## 2020-02-18 MED ORDER — ONDANSETRON HCL 4 MG PO TABS: 4.0000 mg | ORAL_TABLET | Freq: Four times a day (QID) | ORAL | Status: DC | PRN

## 2020-02-18 MED ORDER — ROCURONIUM BROMIDE 10 MG/ML (PF) SYRINGE
PREFILLED_SYRINGE | INTRAVENOUS | Status: AC
  Filled 2020-02-18: qty 10

## 2020-02-18 MED ORDER — SUGAMMADEX SODIUM 200 MG/2ML IV SOLN
INTRAVENOUS | Status: DC | PRN
  Administered 2020-02-18: 130 mg via INTRAVENOUS

## 2020-02-18 SURGICAL SUPPLY — 56 items
APL SKNCLS STERI-STRIP NONHPOA (GAUZE/BANDAGES/DRESSINGS) ×1
BAG DECANTER FOR FLEXI CONT (MISCELLANEOUS) ×2 IMPLANT
BASKET BONE COLLECTION (BASKET) IMPLANT
BENZOIN TINCTURE PRP APPL 2/3 (GAUZE/BANDAGES/DRESSINGS) ×2 IMPLANT
BIT DRILL 2.3 12 FIXED (INSTRUMENTS) IMPLANT
BUR CARBIDE MATCH 3.0 (BURR) ×2 IMPLANT
CANISTER SUCT 3000ML PPV (MISCELLANEOUS) ×2 IMPLANT
CARTRIDGE OIL MAESTRO DRILL (MISCELLANEOUS) ×1 IMPLANT
COVER WAND RF STERILE (DRAPES) ×1 IMPLANT
DIFFUSER DRILL AIR PNEUMATIC (MISCELLANEOUS) ×1 IMPLANT
DRAPE C-ARM 42X72 X-RAY (DRAPES) ×4 IMPLANT
DRAPE LAPAROTOMY 100X72 PEDS (DRAPES) ×2 IMPLANT
DRAPE MICROSCOPE LEICA (MISCELLANEOUS) ×2 IMPLANT
DRILL 12MM (INSTRUMENTS) ×2
DRSG OPSITE POSTOP 3X4 (GAUZE/BANDAGES/DRESSINGS) ×1 IMPLANT
DURAPREP 6ML APPLICATOR 50/CS (WOUND CARE) ×2 IMPLANT
ELECT COATED BLADE 2.86 ST (ELECTRODE) ×2 IMPLANT
ELECT REM PT RETURN 9FT ADLT (ELECTROSURGICAL) ×2
ELECTRODE REM PT RTRN 9FT ADLT (ELECTROSURGICAL) ×1 IMPLANT
GAUZE 4X4 16PLY RFD (DISPOSABLE) IMPLANT
GLOVE BIO SURGEON STRL SZ7 (GLOVE) ×1 IMPLANT
GLOVE BIO SURGEON STRL SZ8 (GLOVE) ×2 IMPLANT
GLOVE BIOGEL PI IND STRL 7.0 (GLOVE) IMPLANT
GLOVE BIOGEL PI IND STRL 8 (GLOVE) IMPLANT
GLOVE BIOGEL PI INDICATOR 7.0 (GLOVE) ×1
GLOVE BIOGEL PI INDICATOR 8 (GLOVE) ×4
GLOVE ECLIPSE 7.5 STRL STRAW (GLOVE) ×3 IMPLANT
GOWN STRL REUS W/ TWL LRG LVL3 (GOWN DISPOSABLE) IMPLANT
GOWN STRL REUS W/ TWL XL LVL3 (GOWN DISPOSABLE) IMPLANT
GOWN STRL REUS W/TWL 2XL LVL3 (GOWN DISPOSABLE) ×3 IMPLANT
GOWN STRL REUS W/TWL LRG LVL3 (GOWN DISPOSABLE) ×2
GOWN STRL REUS W/TWL XL LVL3 (GOWN DISPOSABLE) ×2
HEMOSTAT POWDER KIT SURGIFOAM (HEMOSTASIS) ×2 IMPLANT
KIT BASIN OR (CUSTOM PROCEDURE TRAY) ×2 IMPLANT
KIT TURNOVER KIT B (KITS) ×2 IMPLANT
NDL HYPO 25X1 1.5 SAFETY (NEEDLE) ×1 IMPLANT
NDL SPNL 20GX3.5 QUINCKE YW (NEEDLE) ×1 IMPLANT
NEEDLE HYPO 25X1 1.5 SAFETY (NEEDLE) ×2 IMPLANT
NEEDLE SPNL 20GX3.5 QUINCKE YW (NEEDLE) ×2 IMPLANT
NS IRRIG 1000ML POUR BTL (IV SOLUTION) ×2 IMPLANT
OIL CARTRIDGE MAESTRO DRILL (MISCELLANEOUS)
PACK LAMINECTOMY NEURO (CUSTOM PROCEDURE TRAY) ×2 IMPLANT
PAD ARMBOARD 7.5X6 YLW CONV (MISCELLANEOUS) ×3 IMPLANT
PIN DISTRACTION 14MM (PIN) ×4 IMPLANT
PLATE TRESTLE LUXE 12 1LVL (Plate) ×1 IMPLANT
RUBBERBAND STERILE (MISCELLANEOUS) ×4 IMPLANT
SCREW CANN 4X16 SS S/DRILL (Screw) ×4 IMPLANT
SPACER ASSEM CERV LORD 7M (Spacer) ×1 IMPLANT
SPONGE INTESTINAL PEANUT (DISPOSABLE) ×2 IMPLANT
SPONGE SURGIFOAM ABS GEL SZ50 (HEMOSTASIS) ×1 IMPLANT
STRIP CLOSURE SKIN 1/2X4 (GAUZE/BANDAGES/DRESSINGS) ×2 IMPLANT
SUT VIC AB 3-0 SH 8-18 (SUTURE) ×2 IMPLANT
SUT VICRYL 4-0 PS2 18IN ABS (SUTURE) IMPLANT
TOWEL GREEN STERILE (TOWEL DISPOSABLE) ×2 IMPLANT
TOWEL GREEN STERILE FF (TOWEL DISPOSABLE) ×2 IMPLANT
WATER STERILE IRR 1000ML POUR (IV SOLUTION) ×2 IMPLANT

## 2020-02-18 NOTE — Op Note (Signed)
02/18/2020  9:21 AM  PATIENT:  Bridget Stevens  49 y.o. female  PRE-OPERATIVE DIAGNOSIS: Cervical spondylosis with cervical spinal stenosis C5-6 with neck and arm pain  POST-OPERATIVE DIAGNOSIS:  same  PROCEDURE:  1. Decompressive anterior cervical discectomy C5-6, 2. Anterior cervical arthrodesis C5-6 utilizing a corticocancellus allograft structural bone graft, 3. Anterior cervical plating C5-6 utilizing a 12 mm Alphatec plate  SURGEON:  Sherley Bounds, MD  ASSISTANTS: Glenford Peers, FNP  ANESTHESIA:   General  EBL: 75 ml  Total I/O In: 750 [I.V.:650; IV Piggyback:100] Out: 75 [Blood:75]  BLOOD ADMINISTERED: none  DRAINS: none  SPECIMEN:  none  INDICATION FOR PROCEDURE: This patient presented with neck and arm pain. Imaging showed cervical spondylosis C5-6. The patient tried conservative measures without relief. Pain was debilitating. Recommended ACDF with plating. Patient understood the risks, benefits, and alternatives and potential outcomes and wished to proceed.  PROCEDURE DETAILS: Patient was brought to the operating room placed under general endotracheal anesthesia. Patient was placed in the supine position on the operating room table. The neck was prepped with Duraprep and draped in a sterile fashion.   Three cc of local anesthesia was injected and a transverse incision was made on the right side of the neck.  Dissection was carried down thru the subcutaneous tissue and the platysma was  elevated, opened, and undermined with Metzenbaum scissors.  Dissection was then carried out thru an avascular plane leaving the sternocleidomastoid carotid artery and jugular vein laterally and the trachea and esophagus medially with the assistance of my nurse practitioner. The ventral aspect of the vertebral column was identified and a localizing x-ray was taken. The C5-6 level was identified and all in the room agreed with the level. The longus colli muscles were then elevated and the  retractor was placed with the assistance of my nurse practitioner. The annulus was incised and the disc space entered. Discectomy was performed with micro-curettes and pituitary rongeurs. I then used the high-speed drill to drill the endplates down to the level of the posterior longitudinal ligament.  The operating microscope was draped and brought into the field provided additional magnification, illumination and visualization. Discectomy was continued posteriorly thru the disc space. Posterior longitudinal ligament was opened with a nerve hook, and then removed along with disc herniation and osteophytes, decompressing the spinal canal and thecal sac. We then continued to remove osteophytic overgrowth and disc material decompressing the neural foramina and exiting nerve roots bilaterally. The scope was angled up and down to help decompress and undercut the vertebral bodies. Once the decompression was completed we could pass a nerve hook circumferentially to assure adequate decompression in the midline and in the neural foramina. So by both visualization and palpation we felt we had an adequate decompression of the neural elements. We then measured the height of the intravertebral disc space and selected a 7 millimeter structural allograft. It was then gently positioned in the intravertebral disc space(s) and countersunk. I then used a 12 mm Alphatec plate and placed variable angle screws into the vertebral bodies of each level and locked them into position. The wound was irrigated with bacitracin solution, checked for hemostasis which was established and confirmed. Once meticulous hemostasis was achieved, we then proceeded with closure with the assistance of my nurse practitioner. The platysma was closed with interrupted 3-0 undyed Vicryl suture, the subcuticular layer was closed with interrupted 3-0 undyed Vicryl suture. The skin edges were approximated with steristrips. The drapes were removed. A sterile dressing  was  applied. The patient was then awakened from general anesthesia and transferred to the recovery room in stable condition. At the end of the procedure all sponge, needle and instrument counts were correct.   PLAN OF CARE: Admit for overnight observation  PATIENT DISPOSITION:  PACU - hemodynamically stable.   Delay start of Pharmacological VTE agent (>24hrs) due to surgical blood loss or risk of bleeding:  yes

## 2020-02-18 NOTE — Anesthesia Procedure Notes (Signed)
Procedure Name: Intubation Date/Time: 02/18/2020 8:00 AM Performed by: Adria Dill, CRNA Pre-anesthesia Checklist: Patient identified, Emergency Drugs available, Suction available and Patient being monitored Patient Re-evaluated:Patient Re-evaluated prior to induction Oxygen Delivery Method: Circle system utilized Preoxygenation: Pre-oxygenation with 100% oxygen Induction Type: IV induction Ventilation: Mask ventilation without difficulty Laryngoscope Size: Miller and 2 Grade View: Grade I Tube type: Oral Tube size: 7.0 mm Number of attempts: 1 Airway Equipment and Method: Stylet and Oral airway Placement Confirmation: ETT inserted through vocal cords under direct vision,  positive ETCO2 and breath sounds checked- equal and bilateral Secured at: 21 cm Tube secured with: Tape Dental Injury: Teeth and Oropharynx as per pre-operative assessment

## 2020-02-18 NOTE — Anesthesia Postprocedure Evaluation (Signed)
Anesthesia Post Note  Patient: Bridget Stevens  Procedure(s) Performed: Anterior Cervical Decompression/Discectomy Fusion - Cervical five-Cervcal six (N/A Neck)     Patient location during evaluation: PACU Anesthesia Type: General Level of consciousness: sedated Pain management: pain level controlled Vital Signs Assessment: post-procedure vital signs reviewed and stable Respiratory status: spontaneous breathing and respiratory function stable Cardiovascular status: stable Postop Assessment: no apparent nausea or vomiting Anesthetic complications: no    Last Vitals:  Vitals:   02/18/20 1011 02/18/20 1045  BP: 124/71 (!) 115/56  Pulse:  65  Resp:  20  Temp: 36.7 C 37.1 C  SpO2:  100%    Last Pain:  Vitals:   02/18/20 1045  TempSrc: Oral  PainSc:                  Aarin Bluett DANIEL

## 2020-02-18 NOTE — Progress Notes (Signed)
Orthopedic Tech Progress Note Patient Details:  Bridget Stevens April 16, 1971 [id number] RN called requesting a SOFT COLLAR for patient Ortho Devices Type of Ortho Device: Soft collar Ortho Device/Splint Location: soft collar Ortho Device/Splint Interventions: Application   Post Interventions Patient Tolerated: Well Instructions Provided: Care of device   Donald Pore 02/18/2020, 11:09 AM

## 2020-02-18 NOTE — Transfer of Care (Signed)
Immediate Anesthesia Transfer of Care Note  Patient: Bridget Stevens  Procedure(s) Performed: Anterior Cervical Decompression/Discectomy Fusion - Cervical five-Cervcal six (N/A Neck)  Patient Location: PACU  Anesthesia Type:General  Level of Consciousness: awake, drowsy and patient cooperative  Airway & Oxygen Therapy: Patient Spontanous Breathing and Patient connected to nasal cannula oxygen  Post-op Assessment: Report given to RN and Post -op Vital signs reviewed and stable  Post vital signs: Reviewed and stable  Last Vitals:  Vitals Value Taken Time  BP 129/81 02/18/20 0936  Temp 36.6 C 02/18/20 0936  Pulse 74 02/18/20 0936  Resp 15 02/18/20 0936  SpO2 98 % 02/18/20 0936  Vitals shown include unvalidated device data.  Last Pain:  Vitals:   02/18/20 0654  TempSrc: Temporal         Complications: No apparent anesthesia complications

## 2020-02-18 NOTE — H&P (Signed)
Subjective:   Patient is a 49 y.o. female admitted for nrck pain. The patient first presented to me with complaints of neck pain and shooting pains in the arm(s). Onset of symptoms was several months ago. The pain is described as aching and occurs all day. The pain is rated severe, and is located in the neck and radiates to the arms. The symptoms have been progressive. Symptoms are exacerbated by extending head backwards, and are relieved by none.  Previous work up includes MRI of cervical spine, results: spinal stenosis.  Past Medical History:  Diagnosis Date  . Anxiety   . Arthritis   . Asthma   . GERD (gastroesophageal reflux disease)   . Headache   . Hepatomegaly   . Hx of migraines   . Hyperlipidemia   . Thyroid disease     Past Surgical History:  Procedure Laterality Date  . BTL  07/30/2008  . CARPAL TUNNEL RELEASE Right   . CARPAL TUNNEL RELEASE Left 09/22/2016   Procedure: CARPAL TUNNEL RELEASE, left;  Surgeon: Dairl Ponder, MD;  Location: [facility];  Service: Orthopedics;  Laterality: Left;  . CESAREAN SECTION    . DORSAL COMPARTMENT RELEASE Left 09/22/2016   Procedure: RELEASE DORSAL COMPARTMENT (DEQUERVAIN);  Surgeon: Dairl Ponder, MD;  Location:  SURGERY CENTER;  Service: Orthopedics;  Laterality: Left;  . HYSTEROSCOPY W/ ENDOMETRIAL ABLATION    . TUBAL LIGATION    . WISDOM TOOTH EXTRACTION      Allergies  Allergen Reactions  . Shellfish Allergy Anaphylaxis  . Peanut-Containing Drug Products Swelling    SWELLING REACTION UNSPECIFIED   . Sulfa Antibiotics Hives  . Advil [Ibuprofen]     Due to gastric surgery  . Percocet [Oxycodone-Acetaminophen] Other (See Comments)    PT STATES THAT SHE HAS HEADACHES WITH THIS MED    Social History   Tobacco Use  . Smoking status: Never Smoker  . Smokeless tobacco: Never Used  Substance Use Topics  . Alcohol use: Not Currently    Comment: socially    Family History  Problem Relation Age of  Onset  . Diabetes Father   . Hypertension Father   . Hyperlipidemia Father   . Thyroid disease Father   . Asthma Father   . Cancer Mother   . Hypertension Mother   . Thyroid disease Mother   . Cancer Sister        CERVICAL  . Thyroid disease Sister   . Asthma Sister   . Asthma Brother   . Breast cancer Neg Hx    Prior to Admission medications   Medication Sig Start Date End Date Taking? Authorizing Provider  acetaminophen (TYLENOL) 500 MG tablet Take 1,000 mg by mouth every 6 (six) hours as needed for moderate pain.   Yes [provider]  albuterol (PROVENTIL HFA;VENTOLIN HFA) 108 (90 Base) MCG/ACT inhaler Inhale 2 puffs into the lungs every 4 (four) hours as needed for wheezing or shortness of breath.    Yes [provider]  Calcium Carb-Cholecalciferol (QC CALCIUM 600 +D3 PO) Take 1 tablet by mouth daily.   Yes [provider]  calcium elemental as carbonate (BARIATRIC TUMS ULTRA) 400 MG chewable tablet Chew 1,000-2,000 mg by mouth 3 (three) times daily as needed for heartburn (acid reflux/indigestion.).   Yes [provider]  Cholecalciferol (VITAMIN D) 50 MCG (2000 UT) CAPS Take 2,000 Units by mouth every evening.    Yes [provider]  EPINEPHrine 0.3 mg/0.3 mL IJ SOAJ injection  Inject 0.3 mg into the muscle as needed for anaphylaxis. 11/21/19  Yes [provider]  fluticasone (FLONASE) 50 MCG/ACT nasal spray Place 1 spray into both nostrils daily as needed for allergies.    Yes [provider]  gabapentin (NEURONTIN) 300 MG capsule Take 300 mg by mouth in the morning, at noon, and at bedtime.    Yes [provider]  methocarbamol (ROBAXIN) 500 MG tablet Take 500 mg by mouth in the morning and at bedtime.    Yes [provider]  Olopatadine HCl (PAZEO) 0.7 % SOLN Place 1 drop into both eyes daily.    Yes [provider]  omeprazole (PRILOSEC) 20 MG capsule Take 20 mg by mouth daily.    Yes  [provider]  oxyCODONE-acetaminophen (PERCOCET/ROXICET) 5-325 MG tablet Take 1 tablet by mouth every 6 (six) hours as needed for pain. 01/24/20  Yes [provider]  Pediatric Multiple Vit-C-FA (MULTIVITAMIN ANIMAL SHAPES, WITH CA/FA,) with C & FA chewable tablet Chew 1 tablet by mouth 2 (two) times daily.   Yes [provider]  polyethylene glycol (MIRALAX / GLYCOLAX) 17 g packet Take 17 g by mouth daily as needed (constipation.).   Yes [provider]  cycloSPORINE (RESTASIS) 0.05 % ophthalmic emulsion Place 1 drop into both eyes 2 (two) times daily as needed (dry/irritated eyes.).     [provider]     Review of Systems  Positive ROS: neg  All other systems have been reviewed and were otherwise negative with the exception of those mentioned in the HPI and as above.  Objective: Vital signs in last 24 hours: Temp:  [97.3 F (36.3 C)] 97.3 F (36.3 C) (05/03 0654) Pulse Rate:  [66] 66 (05/03 0654) Resp:  [18] 18 (05/03 0654) BP: (114)/(65) 114/65 (05/03 0654) SpO2:  [96 %] 96 % (05/03 0654) Weight:  [64 kg] 64 kg (05/03 0654)  General Appearance: Alert, cooperative, no distress, appears stated age Head: Normocephalic, without obvious abnormality, atraumatic Eyes: PERRL, conjunctiva/corneas clear, EOM's intact      Neck: Supple, symmetrical, trachea midline, Back: Symmetric, no curvature, ROM normal, no CVA tenderness Lungs:  respirations unlabored Heart: Regular rate and rhythm Abdomen: Soft, non-tender Extremities: Extremities normal, atraumatic, no cyanosis or edema Pulses: 2+ and symmetric all extremities Skin: Skin color, texture, turgor normal, no rashes or lesions  NEUROLOGIC:  Mental status: Alert and oriented x4, no aphasia, good attention span, fund of knowledge and memory  Motor Exam - grossly normal Sensory Exam - grossly normal Reflexes: 1+ Coordination - grossly normal Gait - grossly normal Balance - grossly  normal Cranial Nerves: I: smell Not tested  II: visual acuity  OS: nl    OD: nl  II: visual fields Full to confrontation  II: pupils Equal, round, reactive to light  III,VII: ptosis None  III,IV,VI: extraocular muscles  Full ROM  V: mastication Normal  V: facial light touch sensation  Normal  V,VII: corneal reflex  Present  VII: facial muscle function - upper  Normal  VII: facial muscle function - lower Normal  VIII: hearing Not tested  IX: soft palate elevation  Normal  IX,X: gag reflex Present  XI: trapezius strength  5/5  XI: sternocleidomastoid strength 5/5  XI: neck flexion strength  5/5  XII: tongue strength  Normal    Data Review Lab Results  Component Value Date   WBC 5.1 02/14/2020   HGB 13.8 02/14/2020   HCT 42.3 02/14/2020   MCV 94.8 02/14/2020  PLT 235 02/14/2020   Lab Results  Component Value Date   NA 141 02/14/2020   K 4.2 02/14/2020   CL 103 02/14/2020   CO2 27 02/14/2020   BUN 13 02/14/2020   CREATININE 0.71 02/14/2020   GLUCOSE 91 02/14/2020   Lab Results  Component Value Date   INR 1.1 02/14/2020    Assessment:   Cervical neck pain with herniated nucleus pulposus/ spondylosis/ stenosis at C5-6. Estimated body mass index is 28.48 kg/m as calculated from the following:   Height as of this encounter: 4\' 11"  (1.499 m).   Weight as of this encounter: 64 kg.  Patient has failed conservative therapy. Planned surgery : ACDF C5-6  Plan:   I explained the condition and procedure to the patient and answered any questions.  Patient wishes to proceed with procedure as planned. Understands risks/ benefits/ and expected or typical outcomes.  Eustace Moore 02/18/2020 7:31 AM

## 2020-02-19 ENCOUNTER — Encounter: Payer: Self-pay | Admitting: *Deleted

## 2020-02-19 DIAGNOSIS — M47812 Spondylosis without myelopathy or radiculopathy, cervical region: Secondary | ICD-10-CM | POA: Diagnosis not present

## 2020-02-19 MED ORDER — OXYCODONE-ACETAMINOPHEN 5-325 MG PO TABS: 1.0000 | ORAL_TABLET | Freq: Four times a day (QID) | ORAL | 0 refills | Status: DC | PRN

## 2020-02-19 NOTE — Discharge Instructions (Signed)
Wound Care Keep incision covered and dry for one week.  If you shower prior to then, cover incision with plastic wrap.  You may remove outer bandage after one week and shower.  Do not put any creams, lotions, or ointments on incision. Leave steri-strips on neck.  They will fall off by themselves. Activity Walk each and every day, increasing distance each day. No lifting greater than 5 lbs.  Avoid excessive neck motion. No driving for 2 weeks; may ride as a passenger locally. Wear neck brace at all times except when showering or otherwise instructed. Diet Resume your normal diet.  Return to Work Will be discussed at you follow up appointment. Call Your Doctor If Any of These Occur Redness, drainage, or swelling at the wound.  Temperature greater than 101 degrees. Severe pain not relieved by pain medication. Increased difficulty swallowing.  Incision starts to come apart. Follow Up Appt Call today for appointment in 1-2 weeks ([phone])

## 2020-02-19 NOTE — Discharge Summary (Signed)
Physician Discharge Summary  Patient ID: Bridget Stevens MRN: [id number] DOB/AGE: Dec 28, 1970 49 y.o.  Admit date: 02/18/2020 Discharge date: 02/19/2020  Admission Diagnoses: Cervical spinal stenosis with neck and arm pain   Discharge Diagnoses: Same   Discharged Condition: good  Hospital Course: The patient was admitted on 02/18/2020 and taken to the operating room where the patient underwent ACDF C5-6. The patient tolerated the procedure well and was taken to the recovery room and then to the floor in stable condition. The hospital course was routine. There were no complications. The wound remained clean dry and intact. Pt had appropriate neck soreness. No complaints of arm pain or new N/T/W. The patient remained afebrile with stable vital signs, and tolerated a regular diet. The patient continued to increase activities, and pain was well controlled with oral pain medications.   Consults: None  Significant Diagnostic Studies:  Results for orders placed or performed during the hospital encounter of 02/18/20  Pregnancy, urine POC  Result Value Ref Range   Preg Test, Ur NEGATIVE NEGATIVE    DG Cervical Spine 1 View  Result Date: 02/18/2020 CLINICAL DATA:  C5-6 ACDF. EXAM: DG CERVICAL SPINE - 1 VIEW COMPARISON:  01/29/2020. FINDINGS: Single intraoperative cross-table lateral view of the cervical spine shows C5-6 anterior cervical fusion with an interbody strut. IMPRESSION: C5-6 anterior cervical fusion. Electronically Signed   By: Lorin Picket M.D.   On: 02/18/2020 10:15   MR CERVICAL SPINE WO CONTRAST  Result Date: 01/26/2020 CLINICAL DATA:  49 year old female with bilateral arm pain, weakness and tingling for 5-6 weeks. EXAM: MRI CERVICAL SPINE WITHOUT CONTRAST TECHNIQUE: Multiplanar, multisequence MR imaging of the cervical spine was performed. No intravenous contrast was administered. COMPARISON:  Cervical spine radiographs 06/03/2016. FINDINGS: Alignment: Chronic straightening of  cervical lordosis. No spondylolisthesis. Vertebrae: Faint degenerative endplate marrow edema eccentric to the right at C5-C6. No other acute osseous abnormality identified. Background bone marrow signal within normal limits. Cord: Spinal cord signal is within normal limits at all visualized levels. Posterior Fossa, vertebral arteries, paraspinal tissues: Cervicomedullary junction is within normal limits. Negative visible posterior fossa. Preserved major vascular flow voids in the neck. Negative visible neck soft tissues, lung apices. Disc levels: There is a degree of congenital spinal canal narrowing due to short pedicle distance, with the following superimposed degenerative changes: C2-C3: Small central disc protrusion or thickening of the posterior longitudinal ligament. No significant stenosis. C3-C4: Mild disc bulge and facet hypertrophy. Borderline to mild spinal stenosis. C4-C5: Circumferential disc bulge and mild facet hypertrophy. Mild spinal stenosis, mild if any cord mass effect. C5-C6: Circumferential disc bulge and endplate spurring. Broad-based posterior component with mild facet hypertrophy. Mild spinal stenosis and spinal cord mass effect. Moderate right and mild left C6 foraminal stenosis. C6-C7: Circumferential disc bulge with endplate spurring. Mild ligament flavum hypertrophy. Mild spinal stenosis. Mild if any cord mass effect. Mild right C7 foraminal stenosis. C7-T1: Congenital spinal canal narrowing abates at this level. There is mild to moderate facet hypertrophy greater on the left. No significant stenosis. No upper thoracic stenosis. IMPRESSION: 1. 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 Mild spinal cord mass effect primarily at C5-C6. No cord signal abnormality. 2. Moderate right C6 neural foraminal stenosis. Electronically Signed   By: Genevie Ann M.D.   On: 01/26/2020 20:44   DG C-Arm 1-60 Min  Result Date: 02/18/2020 CLINICAL DATA:  Open reduction  internal fixation EXAM: DG C-ARM 1-60 MIN FLUOROSCOPY TIME:  Fluoroscopy Time: 0 minutes  7 seconds Number of Acquired Spot Images: 1 COMPARISON:  None. FINDINGS: Cross-table lateral cervical image obtained. There is anterior screw and plate fixation at C5 and C6 with a disc spacer at C5-6. No fracture or spondylolisthesis. There is moderate disc space narrowing at C6-7. Other disc spaces appear unremarkable IMPRESSION: Anterior fusion at C5 and C6 with disc spacer at C5-6. Support hardware intact. Disc space narrowing noted at C6-7. No fracture or spondylolisthesis. Electronically Signed   By: Bretta Bang III M.D.   On: 02/18/2020 10:14    Antibiotics:  Anti-infectives (From admission, onward)   Start     Dose/Rate Route Frequency Ordered Stop   02/18/20 1600  ceFAZolin (ANCEF) IVPB 2g/100 mL premix     2 g 200 mL/hr over 30 Minutes Intravenous Every 8 hours 02/18/20 1047 02/18/20 2354   02/18/20 0748  bacitracin 50,000 Units in sodium chloride 0.9 % 500 mL irrigation  Status:  Discontinued       As needed 02/18/20 0749 02/18/20 0930   02/18/20 0715  ceFAZolin (ANCEF) IVPB 2g/100 mL premix     2 g 200 mL/hr over 30 Minutes Intravenous On call to O.R. 02/18/20 0703 02/18/20 2563      Discharge Exam: Blood pressure 113/65, pulse 67, temperature 98.5 F (36.9 C), temperature source Oral, resp. rate 18, height 4\' 11"  (1.499 m), weight 64 kg, SpO2 100 %. Neurologic: Grossly normal Dressing dry  Discharge Medications:   Allergies as of 02/19/2020      Reactions   Shellfish Allergy Anaphylaxis   Peanut-containing Drug Products Swelling   SWELLING REACTION UNSPECIFIED    Sulfa Antibiotics Hives   Advil [ibuprofen]    Due to gastric surgery   Percocet [oxycodone-acetaminophen] Other (See Comments)   PT STATES THAT SHE HAS HEADACHES WITH THIS MED      Medication List    TAKE these medications   acetaminophen 500 MG tablet Commonly known as: TYLENOL Take 1,000 mg by mouth every 6  (six) hours as needed for moderate pain.   albuterol 108 (90 Base) MCG/ACT inhaler Commonly known as: VENTOLIN HFA Inhale 2 puffs into the lungs every 4 (four) hours as needed for wheezing or shortness of breath.   calcium elemental as carbonate 400 MG chewable tablet Commonly known as: BARIATRIC TUMS ULTRA Chew 1,000-2,000 mg by mouth 3 (three) times daily as needed for heartburn (acid reflux/indigestion.).   cycloSPORINE 0.05 % ophthalmic emulsion Commonly known as: RESTASIS Place 1 drop into both eyes 2 (two) times daily as needed (dry/irritated eyes.).   EPINEPHrine 0.3 mg/0.3 mL Soaj injection Commonly known as: EPI-PEN Inject 0.3 mg into the muscle as needed for anaphylaxis.   fluticasone 50 MCG/ACT nasal spray Commonly known as: FLONASE Place 1 spray into both nostrils daily as needed for allergies.   gabapentin 300 MG capsule Commonly known as: NEURONTIN Take 300 mg by mouth in the morning, at noon, and at bedtime.   methocarbamol 500 MG tablet Commonly known as: ROBAXIN Take 500 mg by mouth in the morning and at bedtime.   multivitamin animal shapes (with Ca/FA) with C & FA chewable tablet Chew 1 tablet by mouth 2 (two) times daily.   omeprazole 20 MG capsule Commonly known as: PRILOSEC Take 20 mg by mouth daily.   oxyCODONE-acetaminophen 5-325 MG tablet Commonly known as: PERCOCET/ROXICET Take 1 tablet by mouth every 6 (six) hours as needed. What changed: reasons to take this   Pazeo 0.7 % Soln Generic drug: Olopatadine HCl Place 1  drop into both eyes daily.   polyethylene glycol 17 g packet Commonly known as: MIRALAX / GLYCOLAX Take 17 g by mouth daily as needed (constipation.).   QC CALCIUM 600 +D3 PO Take 1 tablet by mouth daily.   Vitamin D 50 MCG (2000 UT) Caps Take 2,000 Units by mouth every evening.       Disposition: Home   Final Dx: ACDF C5-6  Discharge Instructions     Remove dressing in 72 hours   Complete by: As directed    Call  MD for:  difficulty breathing, headache or visual disturbances   Complete by: As directed    Call MD for:  persistant nausea and vomiting   Complete by: As directed    Call MD for:  redness, tenderness, or signs of infection (pain, swelling, redness, odor or green/yellow discharge around incision site)   Complete by: As directed    Call MD for:  severe uncontrolled pain   Complete by: As directed    Call MD for:  temperature >100.4   Complete by: As directed    Diet - low sodium heart healthy   Complete by: As directed    Increase activity slowly   Complete by: As directed          Signed: Tia Alert 02/19/2020, 8:20 AM

## 2020-02-19 NOTE — Plan of Care (Signed)
Pt given D/C instructions with verbal understanding. Rx was sent to pharmacy by MD. Pt's incision is clean and dry with no sign of infection. Pt's IV was removed prior to D/C. Pt D/C'd home via wheelchair per MD order. Pt is stable @ D/C and has no other needs at this time. Yoceline Bazar, RN 

## 2020-06-09 ENCOUNTER — Other Ambulatory Visit: Payer: Self-pay

## 2020-06-09 ENCOUNTER — Other Ambulatory Visit: Payer: Self-pay | Admitting: Neurological Surgery

## 2020-06-09 ENCOUNTER — Ambulatory Visit (HOSPITAL_COMMUNITY)
Admission: EM | Admit: 2020-06-09 | Discharge: 2020-06-09 | Disposition: A | Discharge status: Home / Self Care | Payer: Medicaid Other | Attending: Family Medicine | Admitting: Family Medicine

## 2020-06-09 ENCOUNTER — Encounter (HOSPITAL_COMMUNITY): Payer: Self-pay

## 2020-06-09 DIAGNOSIS — R509 Fever, unspecified: Secondary | ICD-10-CM | POA: Diagnosis present

## 2020-06-09 DIAGNOSIS — J069 Acute upper respiratory infection, unspecified: Secondary | ICD-10-CM

## 2020-06-09 DIAGNOSIS — M1990 Unspecified osteoarthritis, unspecified site: Secondary | ICD-10-CM | POA: Insufficient documentation

## 2020-06-09 DIAGNOSIS — M5416 Radiculopathy, lumbar region: Secondary | ICD-10-CM

## 2020-06-09 DIAGNOSIS — K219 Gastro-esophageal reflux disease without esophagitis: Secondary | ICD-10-CM | POA: Diagnosis not present

## 2020-06-09 DIAGNOSIS — Z79899 Other long term (current) drug therapy: Secondary | ICD-10-CM | POA: Insufficient documentation

## 2020-06-09 DIAGNOSIS — U071 COVID-19: Secondary | ICD-10-CM | POA: Diagnosis not present

## 2020-06-09 DIAGNOSIS — E785 Hyperlipidemia, unspecified: Secondary | ICD-10-CM | POA: Diagnosis not present

## 2020-06-09 NOTE — ED Provider Notes (Signed)
[facility]    CSN: [id number] Arrival date & time: 06/09/20  1854      History   Chief Complaint Chief Complaint  Patient presents with  . Fever    HPI Bridget Stevens is a 49 y.o. female.   She is presenting with URI type symptoms.  She reports having a fever 2 days ago.  She is not had any elevated temperature since that time.  She has had sinus congestion and cough.  Has tried over-the-counter medications.  Denies any exposure to Covid.  She has been vaccinated against Covid.  HPI  Past Medical History:  Diagnosis Date  . Anxiety   . Arthritis   . Asthma   . GERD (gastroesophageal reflux disease)   . Headache   . Hepatomegaly   . Hx of migraines   . Hyperlipidemia   . Thyroid disease     Patient Active Problem List   Diagnosis Date Noted  . S/P cervical spinal fusion 02/18/2020  . S/P lumbar fusion 09/05/2019  . S/P endometrial ablation - 04/2012 05/29/2012    Past Surgical History:  Procedure Laterality Date  . ANTERIOR CERVICAL DECOMP/DISCECTOMY FUSION N/A 02/18/2020   Procedure: Anterior Cervical Decompression/Discectomy Fusion - Cervical five-Cervcal six;  Surgeon: Tia Alert, MD;  Location: [facility] OR;  Service: Neurosurgery;  Laterality: N/A;  . BTL  07/30/2008  . CARPAL TUNNEL RELEASE Right   . CARPAL TUNNEL RELEASE Left 09/22/2016   Procedure: CARPAL TUNNEL RELEASE, left;  Surgeon: Dairl Ponder, MD;  Location: [facility];  Service: Orthopedics;  Laterality: Left;  . CESAREAN SECTION    . DORSAL COMPARTMENT RELEASE Left 09/22/2016   Procedure: RELEASE DORSAL COMPARTMENT (DEQUERVAIN);  Surgeon: Dairl Ponder, MD;  Location: [facility];  Service: Orthopedics;  Laterality: Left;  . HYSTEROSCOPY W/ ENDOMETRIAL ABLATION    . TUBAL LIGATION    . WISDOM TOOTH EXTRACTION      OB History    Gravida  1   Para  1   Term      Preterm      AB      Living  1     SAB      TAB      Ectopic      Multiple        Live Births               Home Medications    Prior to Admission medications   Medication Sig Start Date End Date Taking? Authorizing Provider  acetaminophen (TYLENOL) 500 MG tablet Take 1,000 mg by mouth every 6 (six) hours as needed for moderate pain.   Yes [provider]  albuterol (PROVENTIL HFA;VENTOLIN HFA) 108 (90 Base) MCG/ACT inhaler Inhale 2 puffs into the lungs every 4 (four) hours as needed for wheezing or shortness of breath.    Yes [provider]  Calcium Carb-Cholecalciferol (QC CALCIUM 600 +D3 PO) Take 1 tablet by mouth daily.   Yes [provider]  calcium elemental as carbonate (BARIATRIC TUMS ULTRA) 400 MG chewable tablet Chew 1,000-2,000 mg by mouth 3 (three) times daily as needed for heartburn (acid reflux/indigestion.).   Yes [provider]  Cholecalciferol (VITAMIN D) 50 MCG (2000 UT) CAPS Take 2,000 Units by mouth every evening.    Yes [provider]  cycloSPORINE (RESTASIS) 0.05 % ophthalmic emulsion Place 1 drop into both eyes 2 (two) times daily as needed (dry/irritated eyes.).    Yes [provider]  EPINEPHrine 0.3 mg/0.3 mL IJ SOAJ injection Inject 0.3 mg into the muscle as needed for anaphylaxis. 11/21/19  Yes [provider]  fluticasone (FLONASE) 50 MCG/ACT nasal spray Place 1 spray into both nostrils daily as needed for allergies.    Yes [provider]  gabapentin (NEURONTIN) 300 MG capsule Take 300 mg by mouth in the morning, at noon, and at bedtime.    Yes [provider]  methocarbamol (ROBAXIN) 500 MG tablet Take 500 mg by mouth in the morning and at bedtime.    Yes [provider]  Olopatadine HCl (PAZEO) 0.7 % SOLN Place 1 drop into both eyes daily.    Yes [provider]  omeprazole (PRILOSEC) 20 MG capsule Take 20 mg by mouth daily.    Yes [provider]  oxyCODONE-acetaminophen (PERCOCET/ROXICET) 5-325 MG tablet Take 1 tablet by  mouth every 6 (six) hours as needed. 02/19/20  Yes Tia Alert, MD  Pediatric Multiple Vit-C-FA (MULTIVITAMIN ANIMAL SHAPES, WITH CA/FA,) with C & FA chewable tablet Chew 1 tablet by mouth 2 (two) times daily.   Yes [provider]  polyethylene glycol (MIRALAX / GLYCOLAX) 17 g packet Take 17 g by mouth daily as needed (constipation.).   Yes [provider]    Family History Family History  Problem Relation Age of Onset  . Diabetes Father   . Hypertension Father   . Hyperlipidemia Father   . Thyroid disease Father   . Asthma Father   . Cancer Mother   . Hypertension Mother   . Thyroid disease Mother   . Cancer Sister        CERVICAL  . Thyroid disease Sister   . Asthma Sister   . Asthma Brother   . Breast cancer Neg Hx     Social History Social History   Tobacco Use  . Smoking status: Never Smoker  . Smokeless tobacco: Never Used  Vaping Use  . Vaping Use: Never used  Substance Use Topics  . Alcohol use: Not Currently    Comment: socially  . Drug use: No     Allergies   Shellfish allergy, Peanut-containing drug products, Sulfa antibiotics, Advil [ibuprofen], and Percocet [oxycodone-acetaminophen]   Review of Systems Review of Systems  See HPI  Physical Exam Triage Vital Signs ED Triage Vitals  Enc Vitals Group     BP 06/09/20 1938 109/71     Pulse Rate 06/09/20 1938 68     Resp 06/09/20 1938 19     Temp 06/09/20 1938 98.4 F (36.9 C)     Temp src --      SpO2 06/09/20 1938 99 %     Weight --      Height --      Head Circumference --      Peak Flow --      Pain Score 06/09/20 1936 8     Pain Loc --      Pain Edu? --      Excl. in GC? --    No data found.  Updated Vital Signs BP 109/71   Pulse 68   Temp 98.4 F (36.9 C)   Resp 19   SpO2 99%   Visual Acuity Right Eye Distance:   Left Eye Distance:   Bilateral Distance:    Right Eye Near:   Left Eye Near:    Bilateral Near:     Physical Exam Gen: NAD, alert,  cooperative with exam, well-appearing ENT: normal lips,  normal nasal mucosa, tympanic membranes clear and intact bilaterally Eye: normal EOM, normal conjunctiva and lids CV: S1-S2, regular rate and rhythm Resp: no accessory muscle use, non-labored,  Skin: no rashes, no areas of induration  Neuro: normal tone, normal sensation to touch Psych:  normal insight, alert and oriented    UC Treatments / Results  Labs (all labs ordered are listed, but only abnormal results are displayed) Labs Reviewed  SARS CORONAVIRUS 2 (TAT 6-24 HRS)    EKG   Radiology No results found.  Procedures Procedures (including critical care time)  Medications Ordered in UC Medications - No data to display  Initial Impression / Assessment and Plan / UC Course  I have reviewed the triage vital signs and the nursing notes.  Pertinent labs & imaging results that were available during my care of the patient were reviewed by me and considered in my medical decision making (see chart for details).     Ms. Latterell is a 49 year old female is presenting with upper respiratory infection type symptoms.  Reports last fever was 2 days ago.  We will test for Covid.  Counseled supportive care.  Give indications on follow-up.  Final Clinical Impressions(s) / UC Diagnoses   Final diagnoses:  Acute upper respiratory infection     Discharge Instructions     Please try things such as zyrtec-D or allegra-D which is an antihistamine and decongestant.  Please try afrin which will help with nasal congestion but use for only three days.  Please also try using a netti pot on a regular occasion. Please try honey, vick's vapor rub, lozenges and humidifer for cough and sore throat  We will call if test is positive.     ED Prescriptions    None     PDMP not reviewed this encounter.   Myra Rude, MD 06/09/20 2108

## 2020-06-09 NOTE — ED Triage Notes (Signed)
Pt presents with complaints of a week of fever (100), coughing, and sneezing. Reports taking mucinex with some relief. Pt endorses a headache as well.

## 2020-06-09 NOTE — Discharge Instructions (Signed)
Please try things such as zyrtec-D or allegra-D which is an antihistamine and decongestant.  Please try afrin which will help with nasal congestion but use for only three days.  Please also try using a netti pot on a regular occasion. Please try honey, vick's vapor rub, lozenges and humidifer for cough and sore throat  We will call if test is positive.

## 2020-06-10 LAB — SARS CORONAVIRUS 2 (TAT 6-24 HRS): SARS Coronavirus 2: POSITIVE — AB

## 2020-06-12 ENCOUNTER — Telehealth: Payer: Self-pay | Admitting: Infectious Diseases

## 2020-06-12 NOTE — Telephone Encounter (Signed)
Called to Discuss with patient about Covid symptoms and the use of the monoclonal antibody infusion for those with mild to moderate Covid symptoms and at a high risk of hospitalization.     Pt appears to qualify for this infusion due to co-morbid conditions and/or a member of an at-risk group in accordance with the FDA Emergency Use Authorization.    She has a lot of fatigue and some sneezing and coughing. Sx started 8/16 with fevers. Out of window for MAB but I offered her post covid clinic information to schedule an appt to be seen to help with symptom management, especially while her pcp is out of town.

## 2020-06-27 ENCOUNTER — Other Ambulatory Visit: Payer: Self-pay | Admitting: Internal Medicine

## 2020-06-27 DIAGNOSIS — Z1231 Encounter for screening mammogram for malignant neoplasm of breast: Secondary | ICD-10-CM

## 2020-07-06 ENCOUNTER — Ambulatory Visit (HOSPITAL_COMMUNITY)
Admission: EM | Admit: 2020-07-06 | Discharge: 2020-07-06 | Disposition: A | Discharge status: Home / Self Care | Payer: Medicaid Other | Attending: Family Medicine | Admitting: Family Medicine

## 2020-07-06 ENCOUNTER — Other Ambulatory Visit: Payer: Self-pay

## 2020-07-06 ENCOUNTER — Encounter (HOSPITAL_COMMUNITY): Payer: Self-pay | Admitting: Emergency Medicine

## 2020-07-06 DIAGNOSIS — M79631 Pain in right forearm: Secondary | ICD-10-CM

## 2020-07-06 DIAGNOSIS — M7989 Other specified soft tissue disorders: Secondary | ICD-10-CM

## 2020-07-06 MED ORDER — KETOROLAC TROMETHAMINE 30 MG/ML IJ SOLN
30.0000 mg | Freq: Once | INTRAMUSCULAR | Status: AC
  Administered 2020-07-06: 30 mg via INTRAMUSCULAR

## 2020-07-06 MED ORDER — TIZANIDINE HCL 4 MG PO TABS: 4.0000 mg | ORAL_TABLET | Freq: Four times a day (QID) | ORAL | 0 refills | Status: DC | PRN

## 2020-07-06 MED ORDER — PREDNISONE 20 MG PO TABS: 20.0000 mg | ORAL_TABLET | Freq: Every day | ORAL | 0 refills | Status: AC

## 2020-07-06 MED ORDER — KETOROLAC TROMETHAMINE 30 MG/ML IJ SOLN
INTRAMUSCULAR | Status: AC
  Filled 2020-07-06: qty 1

## 2020-07-06 MED ORDER — DEXAMETHASONE SODIUM PHOSPHATE 10 MG/ML IJ SOLN
10.0000 mg | Freq: Once | INTRAMUSCULAR | Status: AC
  Administered 2020-07-06: 10 mg via INTRAMUSCULAR

## 2020-07-06 MED ORDER — DEXAMETHASONE SODIUM PHOSPHATE 10 MG/ML IJ SOLN
INTRAMUSCULAR | Status: AC
  Filled 2020-07-06: qty 1

## 2020-07-06 NOTE — ED Triage Notes (Signed)
Pt presents with right arm pain xs 3-4 days ago.

## 2020-07-06 NOTE — Discharge Instructions (Addendum)
If no improvement of symptoms within the next 3 to 4 days contact [facility] to schedule follow-up appointment for further evaluation of pain of the right forearm.  Continue to wear sling while awake and keep arm elevated and apply applications of heat at minimum twice daily to help with pain relief.  Take medications as prescribed.

## 2020-07-06 NOTE — ED Provider Notes (Signed)
[facility]    CSN: [id number] Arrival date & time: 07/06/20  1307      History   Chief Complaint Chief Complaint  Patient presents with  . Arm Pain    HPI Bridget Stevens is a 49 y.o. female.   HPI Patient presents with right arm pain. Pain extends from her lateral right forearm all the way down to her wrist. She endorses some numbness and tingling of her fingers. Patient has had a history of bilateral carpal tunnel syndrome with release surgeries. Patient reports her daughter had pulled on her arm however symptoms had been present for weeks prior to that incident. She has taken some over-the-counter pain reliever without any improvement. She has noticed some localized tenderness on the lateral upper forearm. She denies any falls. Past Medical History:  Diagnosis Date  . Anxiety   . Arthritis   . Asthma   . GERD (gastroesophageal reflux disease)   . Headache   . Hepatomegaly   . Hx of migraines   . Hyperlipidemia   . Thyroid disease     Patient Active Problem List   Diagnosis Date Noted  . S/P cervical spinal fusion 02/18/2020  . S/P lumbar fusion 09/05/2019  . S/P endometrial ablation - 04/2012 05/29/2012    Past Surgical History:  Procedure Laterality Date  . ANTERIOR CERVICAL DECOMP/DISCECTOMY FUSION N/A 02/18/2020   Procedure: Anterior Cervical Decompression/Discectomy Fusion - Cervical five-Cervcal six;  Surgeon: Tia Alert, MD;  Location: [facility];  Service: Neurosurgery;  Laterality: N/A;  . BTL  07/30/2008  . CARPAL TUNNEL RELEASE Right   . CARPAL TUNNEL RELEASE Left 09/22/2016   Procedure: CARPAL TUNNEL RELEASE, left;  Surgeon: Dairl Ponder, MD;  Location: [facility];  Service: Orthopedics;  Laterality: Left;  . CESAREAN SECTION    . DORSAL COMPARTMENT RELEASE Left 09/22/2016   Procedure: RELEASE DORSAL COMPARTMENT (DEQUERVAIN);  Surgeon: Dairl Ponder, MD;  Location: [facility];  Service: Orthopedics;   Laterality: Left;  . HYSTEROSCOPY W/ ENDOMETRIAL ABLATION    . TUBAL LIGATION    . WISDOM TOOTH EXTRACTION      OB History    Gravida  1   Para  1   Term      Preterm      AB      Living  1     SAB      TAB      Ectopic      Multiple      Live Births               Home Medications    Prior to Admission medications   Medication Sig Start Date End Date Taking? Authorizing Provider  oxyCODONE-acetaminophen (PERCOCET/ROXICET) 5-325 MG tablet Take 1 tablet by mouth every 6 (six) hours as needed. 02/19/20  Yes Tia Alert, MD  acetaminophen (TYLENOL) 500 MG tablet Take 1,000 mg by mouth every 6 (six) hours as needed for moderate pain.    [provider]  albuterol (PROVENTIL HFA;VENTOLIN HFA) 108 (90 Base) MCG/ACT inhaler Inhale 2 puffs into the lungs every 4 (four) hours as needed for wheezing or shortness of breath.     [provider]  Calcium Carb-Cholecalciferol (QC CALCIUM 600 +D3 PO) Take 1 tablet by mouth daily.    [provider]  calcium elemental as carbonate (BARIATRIC TUMS ULTRA) 400 MG chewable tablet Chew 1,000-2,000 mg by mouth 3 (three) times daily as needed for heartburn (acid reflux/indigestion.).  [provider]  Cholecalciferol (VITAMIN D) 50 MCG (2000 UT) CAPS Take 2,000 Units by mouth every evening.     [provider]  cycloSPORINE (RESTASIS) 0.05 % ophthalmic emulsion Place 1 drop into both eyes 2 (two) times daily as needed (dry/irritated eyes.).     [provider]  EPINEPHrine 0.3 mg/0.3 mL IJ SOAJ injection Inject 0.3 mg into the muscle as needed for anaphylaxis. 11/21/19   [provider]  fluticasone (FLONASE) 50 MCG/ACT nasal spray Place 1 spray into both nostrils daily as needed for allergies.     [provider]  gabapentin (NEURONTIN) 300 MG capsule Take 300 mg by mouth in the morning, at noon, and at bedtime.     [provider]  methocarbamol (ROBAXIN)  500 MG tablet Take 500 mg by mouth in the morning and at bedtime.     [provider]  Olopatadine HCl (PAZEO) 0.7 % SOLN Place 1 drop into both eyes daily.     [provider]  omeprazole (PRILOSEC) 20 MG capsule Take 20 mg by mouth daily.     [provider]  Pediatric Multiple Vit-C-FA (MULTIVITAMIN ANIMAL SHAPES, WITH CA/FA,) with C & FA chewable tablet Chew 1 tablet by mouth 2 (two) times daily.    [provider]  polyethylene glycol (MIRALAX / GLYCOLAX) 17 g packet Take 17 g by mouth daily as needed (constipation.).    [provider]    Family History Family History  Problem Relation Age of Onset  . Diabetes Father   . Hypertension Father   . Hyperlipidemia Father   . Thyroid disease Father   . Asthma Father   . Cancer Mother   . Hypertension Mother   . Thyroid disease Mother   . Cancer Sister        CERVICAL  . Thyroid disease Sister   . Asthma Sister   . Asthma Brother   . Breast cancer Neg Hx     Social History Social History   Tobacco Use  . Smoking status: Never Smoker  . Smokeless tobacco: Never Used  Vaping Use  . Vaping Use: Never used  Substance Use Topics  . Alcohol use: Not Currently    Comment: socially  . Drug use: No     Allergies   Shellfish allergy, Peanut-containing drug products, Sulfa antibiotics, Advil [ibuprofen], and Percocet [oxycodone-acetaminophen]   Review of Systems Review of Systems Pertinent negatives listed in HPI  Physical Exam Triage Vital Signs ED Triage Vitals  Enc Vitals Group     BP 07/06/20 1429 (!) 106/39     Pulse Rate 07/06/20 1429 (!) 57     Resp 07/06/20 1429 16     Temp 07/06/20 1429 98.5 F (36.9 C)     Temp Source 07/06/20 1429 Oral     SpO2 07/06/20 1429 100 %     Weight --      Height --      Head Circumference --      Peak Flow --      Pain Score 07/06/20 1428 8     Pain Loc --      Pain Edu? --      Excl. in GC? --    No data found.  Updated  Vital Signs BP (!) 106/39 (BP Location: Left Arm)   Pulse (!) 57   Temp 98.5 F (36.9 C) (Oral)   Resp 16   SpO2 100%   Visual Acuity Right Eye Distance:  Left Eye Distance:   Bilateral Distance:    Right Eye Near:   Left Eye Near:    Bilateral Near:     Physical Exam Constitutional:      Appearance: She is not ill-appearing or toxic-appearing.  Cardiovascular:     Rate and Rhythm: Normal rate and regular rhythm.  Pulmonary:     Effort: Pulmonary effort is normal.     Breath sounds: Normal breath sounds.  Musculoskeletal:     Right forearm: Edema and tenderness present.     Cervical back: Normal range of motion and neck supple.     Comments: Lateral elbow tenderness and trace swelling  Skin:    General: Skin is warm.     Capillary Refill: Capillary refill takes less than 2 seconds.  Neurological:     General: No focal deficit present.     Mental Status: She is alert and oriented to person, place, and time.     Motor: No weakness.     Coordination: Coordination normal.     Gait: Gait normal.  Psychiatric:        Mood and Affect: Mood is anxious.        Speech: Speech is tangential.      UC Treatments / Results  Labs (all labs ordered are listed, but only abnormal results are displayed) Labs Reviewed - No data to display  EKG   Radiology No results found. Procedures Procedures (including critical care time)  Medications Ordered in UC Medications  dexamethasone (DECADRON) injection 10 mg (10 mg Intramuscular Given 07/06/20 1513)  ketorolac (TORADOL) 30 MG/ML injection 30 mg (30 mg Intramuscular Given 07/06/20 1514)    Initial Impression / Assessment and Plan / UC Course  I have reviewed the triage vital signs and the nursing notes.  Pertinent labs & imaging results that were available during my care of the patient were reviewed by me and considered in my medical decision making (see chart for details).    Acute right arm strain with swelling and pain  at forearm without initial injury, given tenderness at the lateral elbow region , suspect lateral epicondylitis likely cause of right arm pain vs  carpal tunnel syndrome. Toradol and Decadron IM injection given here in clinic.Outpatient treatment per orders. Recommended follow-up with orthopedics if no improvement. Final Clinical Impressions(s) / UC Diagnoses   Final diagnoses:  Pain and swelling of forearm, right     Discharge Instructions     If no improvement of symptoms within the next 3 to 4 days contact [facility] to schedule follow-up appointment for further evaluation of pain of the right forearm.  Continue to wear sling while awake and keep arm elevated and apply applications of heat at minimum twice daily to help with pain relief.  Take medications as prescribed.    ED Prescriptions    Medication Sig Dispense Auth. Provider   predniSONE (DELTASONE) 20 MG tablet Take 1 tablet (20 mg total) by mouth daily with breakfast for 5 days. 5 tablet Bing Neighbors, FNP   tiZANidine (ZANAFLEX) 4 MG tablet Take 1 tablet (4 mg total) by mouth every 6 (six) hours as needed for muscle spasms. 30 tablet Bing Neighbors, FNP     PDMP not reviewed this encounter.   Bing Neighbors, [place] 07/09/20 2203

## 2020-07-10 ENCOUNTER — Other Ambulatory Visit: Payer: Medicaid Other

## 2020-07-13 ENCOUNTER — Encounter (HOSPITAL_COMMUNITY): Payer: Self-pay

## 2020-07-13 ENCOUNTER — Other Ambulatory Visit: Payer: Self-pay

## 2020-07-13 ENCOUNTER — Emergency Department (HOSPITAL_COMMUNITY)
Admission: EM | Admit: 2020-07-13 | Discharge: 2020-07-14 | Disposition: A | Discharge status: Home / Self Care | Payer: Medicaid Other | Attending: Emergency Medicine | Admitting: Emergency Medicine

## 2020-07-13 ENCOUNTER — Emergency Department (HOSPITAL_COMMUNITY): Payer: Medicaid Other

## 2020-07-13 DIAGNOSIS — Z20822 Contact with and (suspected) exposure to covid-19: Secondary | ICD-10-CM | POA: Diagnosis not present

## 2020-07-13 DIAGNOSIS — R1030 Lower abdominal pain, unspecified: Secondary | ICD-10-CM | POA: Diagnosis present

## 2020-07-13 DIAGNOSIS — J45909 Unspecified asthma, uncomplicated: Secondary | ICD-10-CM | POA: Diagnosis not present

## 2020-07-13 DIAGNOSIS — Z91010 Allergy to peanuts: Secondary | ICD-10-CM | POA: Diagnosis not present

## 2020-07-13 DIAGNOSIS — R1031 Right lower quadrant pain: Secondary | ICD-10-CM

## 2020-07-13 DIAGNOSIS — K219 Gastro-esophageal reflux disease without esophagitis: Secondary | ICD-10-CM | POA: Insufficient documentation

## 2020-07-13 LAB — COMPREHENSIVE METABOLIC PANEL
ALT: 45 U/L — ABNORMAL HIGH (ref 0–44)
AST: 24 U/L (ref 15–41)
Albumin: 4.3 g/dL (ref 3.5–5.0)
Alkaline Phosphatase: 82 U/L (ref 38–126)
Anion gap: 14 (ref 5–15)
BUN: 10 mg/dL (ref 6–20)
CO2: 28 mmol/L (ref 22–32)
Calcium: 9.8 mg/dL (ref 8.9–10.3)
Chloride: 101 mmol/L (ref 98–111)
Creatinine, Ser: 0.59 mg/dL (ref 0.44–1.00)
GFR calc Af Amer: >60 mL/min (ref >60)
GFR calc non Af Amer: >60 mL/min (ref >60)
Glucose, Bld: 96 mg/dL (ref 70–99)
Potassium: 3.9 mmol/L (ref 3.5–5.1)
Sodium: 143 mmol/L (ref 135–145)
Total Bilirubin: 0.6 mg/dL (ref 0.3–1.2)
Total Protein: 7.7 g/dL (ref 6.5–8.1)

## 2020-07-13 LAB — CBC WITH DIFFERENTIAL/PLATELET
Abs Immature Granulocytes: 0.02 10*3/uL (ref 0.00–0.07)
Basophils Absolute: 0.1 10*3/uL (ref 0.0–0.1)
Basophils Relative: 1 %
Eosinophils Absolute: 0.1 10*3/uL (ref 0.0–0.5)
Eosinophils Relative: 2 %
HCT: 41.1 % (ref 36.0–46.0)
Hemoglobin: 13.9 g/dL (ref 12.0–15.0)
Immature Granulocytes: 0 %
Lymphocytes Relative: 26 %
Lymphs Abs: 2.2 10*3/uL (ref 0.7–4.0)
MCH: 32.7 pg (ref 26.0–34.0)
MCHC: 33.8 g/dL (ref 30.0–36.0)
MCV: 96.7 fL (ref 80.0–100.0)
Monocytes Absolute: 0.6 10*3/uL (ref 0.1–1.0)
Monocytes Relative: 8 %
Neutro Abs: 5.3 10*3/uL (ref 1.7–7.7)
Neutrophils Relative %: 63 %
Platelets: 240 10*3/uL (ref 150–400)
RBC: 4.25 MIL/uL (ref 3.87–5.11)
RDW: 13.7 % (ref 11.5–15.5)
WBC: 8.3 10*3/uL (ref 4.0–10.5)
nRBC: 0 % (ref 0.0–0.2)

## 2020-07-13 LAB — RESPIRATORY PANEL BY RT PCR (FLU A&B, COVID)
Influenza A by PCR: NEGATIVE
Influenza B by PCR: NEGATIVE
SARS Coronavirus 2 by RT PCR: NEGATIVE

## 2020-07-13 IMAGING — CT CT ABD-PELV W/ CM
2 of 5 series · 15 of 46 positions shown, 17 images · IV contrast (omnipaque)
Comparison: None.

CLINICAL DATA: Abdominal distension. Right lower quadrant pain and
tenderness to palpation.

EXAM:
CT ABDOMEN AND PELVIS WITH CONTRAST
TECHNIQUE: Multidetector CT imaging of the abdomen and pelvis was performed
using the standard protocol following bolus administration of
intravenous contrast.
CONTRAST:  100mL OMNIPAQUE IOHEXOL 300 MG/ML  SOLN

[Series 3: axial st · axial · 0.65mm/px · z∈[-433,-83]mm · 12 of 82 slices shown, 14 images]
[im 6/82  soft-tissue]
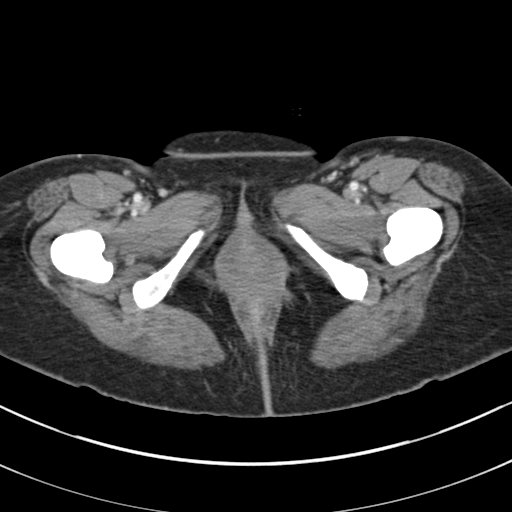
[im 6/82  bone]
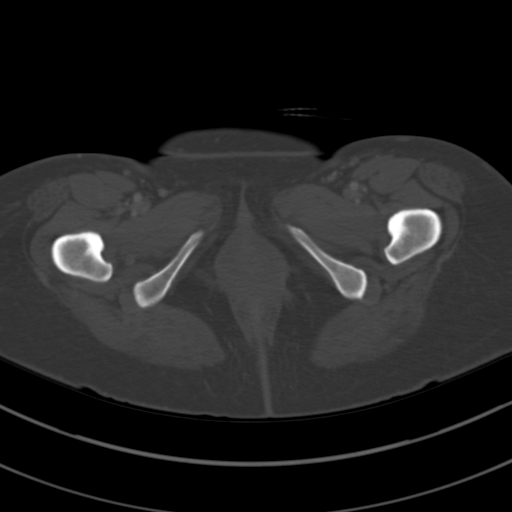
[im 11/82  soft-tissue]
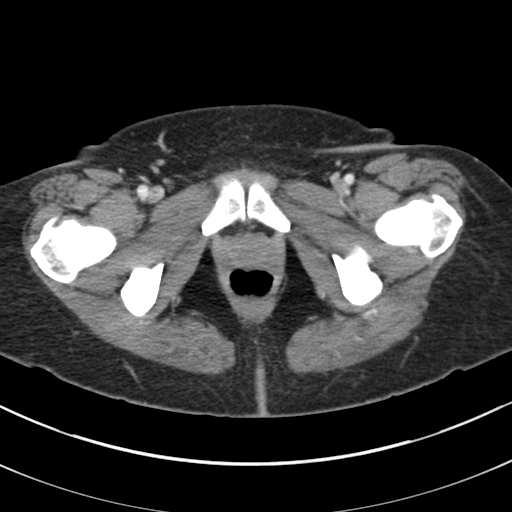
[im 17/82  soft-tissue]
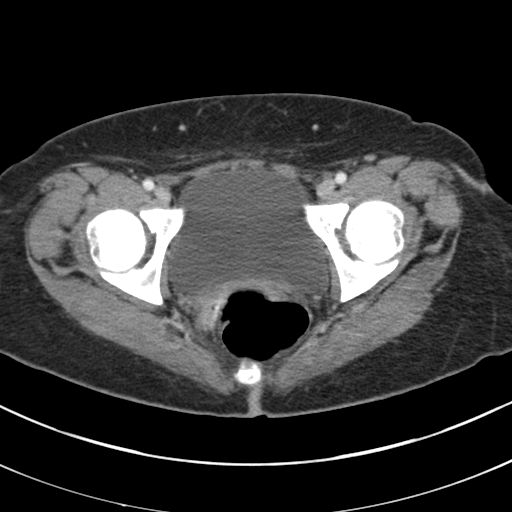
[im 28/82  soft-tissue]
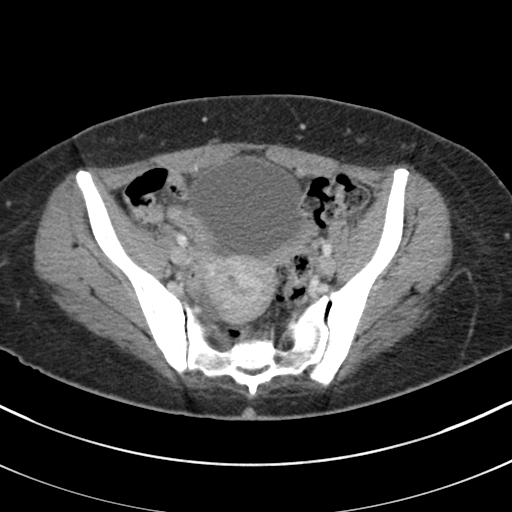
[im 33/82  soft-tissue]
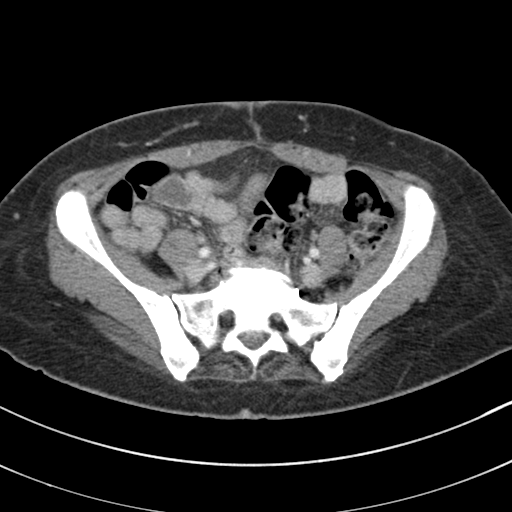
[im 38/82  soft-tissue]
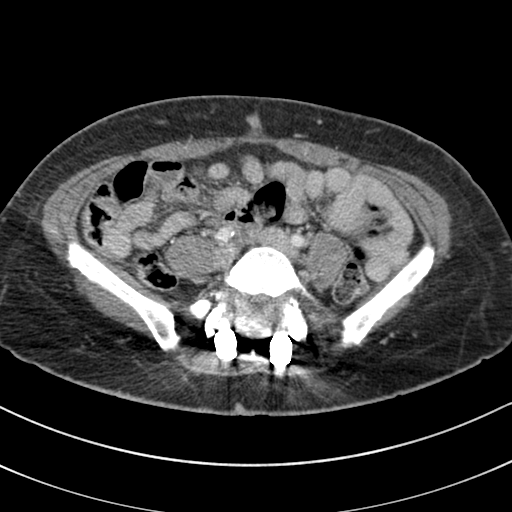
[im 44/82  soft-tissue]
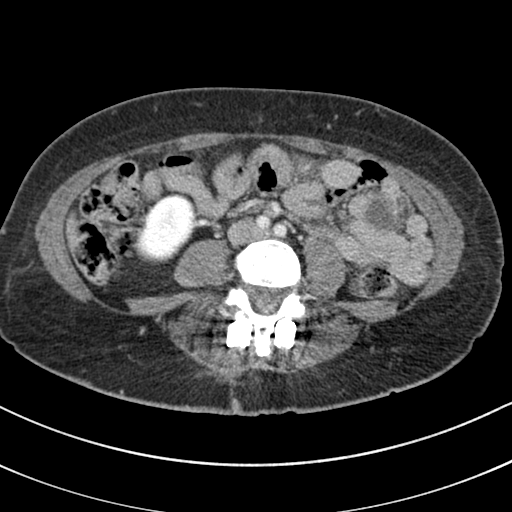
[im 49/82  soft-tissue]
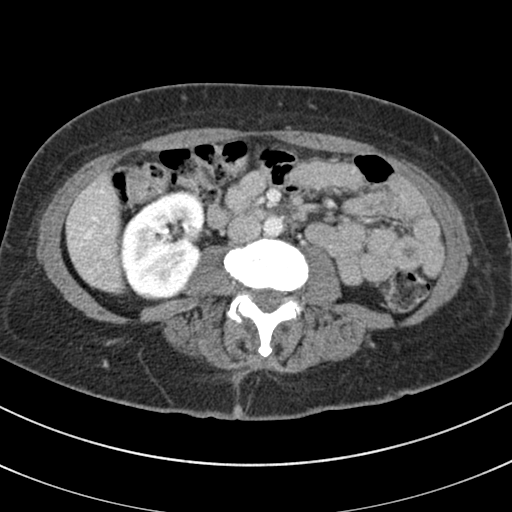
[im 55/82  soft-tissue]
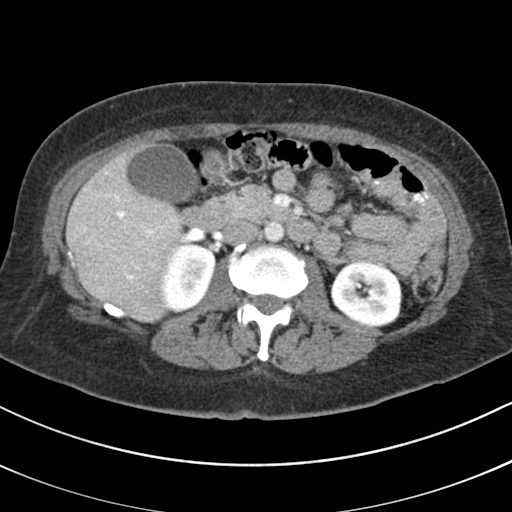
[im 55/82  bone]
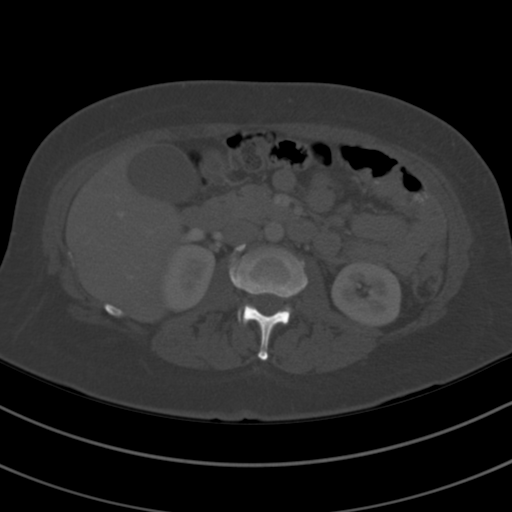
[im 65/82  soft-tissue]
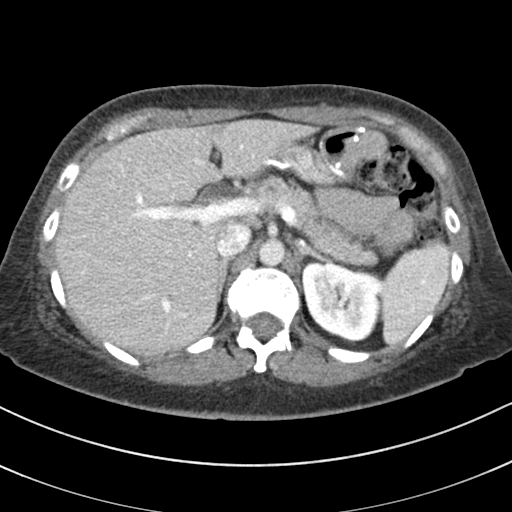
[im 71/82  soft-tissue]
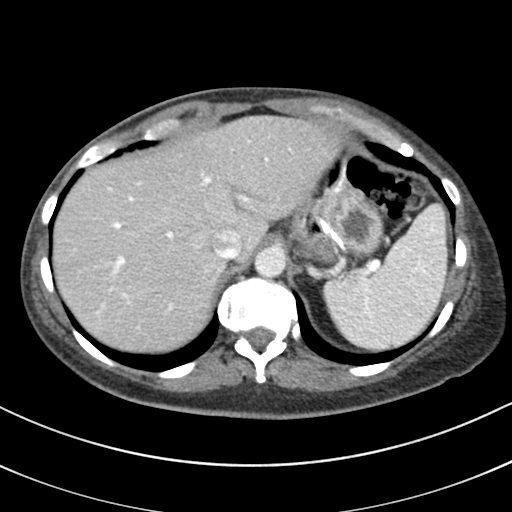
[im 76/82  soft-tissue]
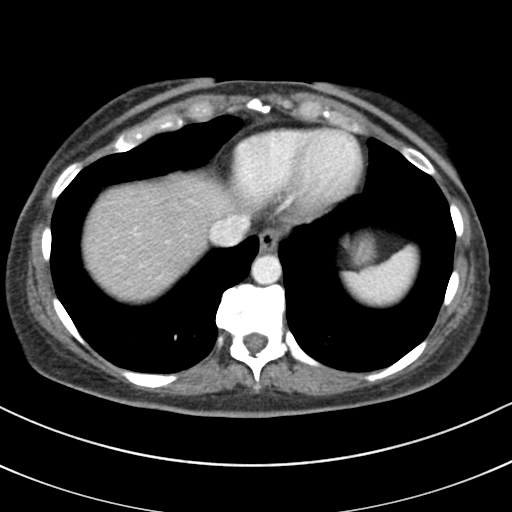

[Series 6: coronal st · coronal · 0.72mm/px · 3 of 119 slices shown]
[im 40/119  soft-tissue]
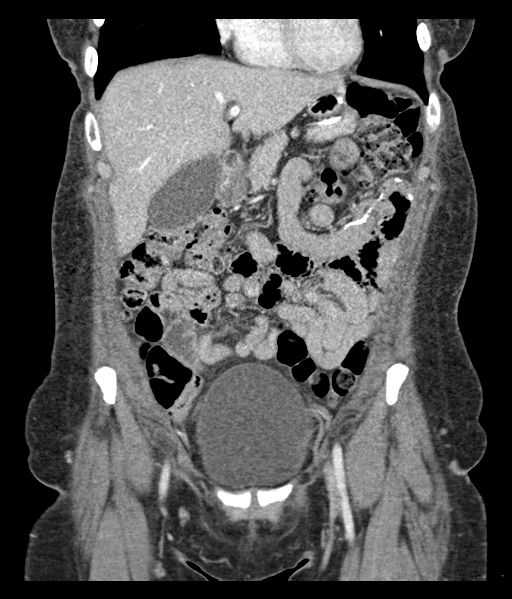
[im 53/119  soft-tissue]
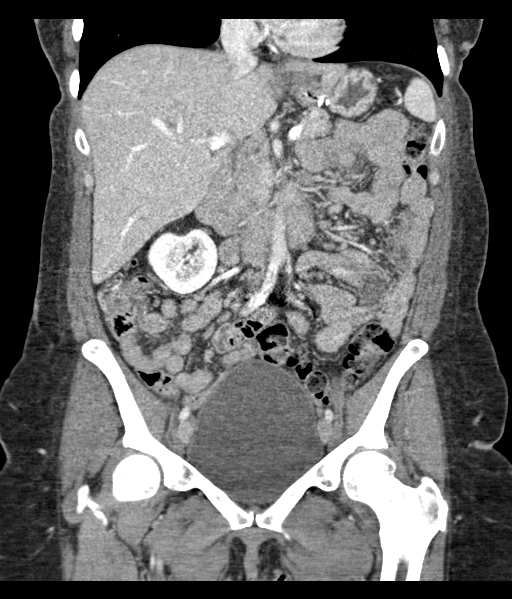
[im 66/119  soft-tissue]
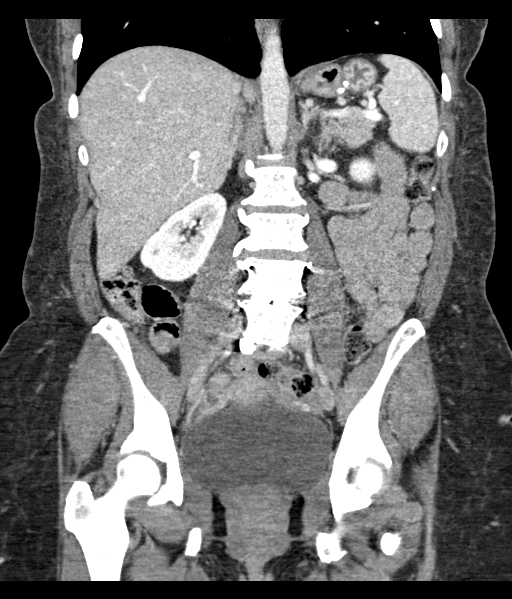

[15 of 46 positions shown; findings below may reference images not displayed]

FINDINGS: Lower chest: Lung bases are clear.

Hepatobiliary: No focal hepatic abnormality. Mild gallbladder
distention but no calcified gallstone or pericholecystic
inflammation. The common bile duct is dilated at 10 mm at the porta
hepatis, 10 mm in the distal portion. No cause for obstruction or
evidence of choledocholithiasis. There is minimal central
intrahepatic biliary ductal dilatation.

Pancreas: No ductal dilatation or inflammation.

Spleen: Normal in size without focal abnormality. Splenule
inferiorly.

Adrenals/Urinary Tract: Normal adrenal glands. No hydronephrosis or
perinephric edema. Homogeneous renal enhancement with symmetric
excretion on delayed phase imaging. Urinary bladder is
physiologically distended without wall thickening.

Stomach/Bowel: Bowel evaluation is limited in the absence of enteric
contrast. Gastric bypass. Excluded gastric remnant contains small
amount of fluid. The Roux limb is nondilated. The jejunal
anastomosis is unremarkable. There is no small bowel obstruction or
inflammatory change. Normal air-filled appendix courses inferior to
the liver, series 3, image 37. No appendicitis. No terminal ileal
inflammation. Moderate stool burden in the colon. No colonic wall
thickening. No pericolonic edema.

Vascular/Lymphatic: Trace aorto bi-iliac atherosclerosis. No aortic
aneurysm. The portal vein is patent. No abdominopelvic adenopathy.

Reproductive: Peripherally enhancing low-density lesions in the
anterior uterine fundus uterus, largest measuring 14 mm, may
represent degenerating fibroids or be secondary to prior endometrial
ablation. No adnexal mass.

Other: No free air, free fluid, or intra-abdominal fluid collection.

Musculoskeletal: Posterior fusion L4-S1 with interbody spacers.
Hardware is intact. There are no acute or suspicious osseous
abnormalities. Degenerative change of the sacroiliac joints.
IMPRESSION: 1. Normal appendix.
2. Mild gallbladder distention without calcified gallstone or
pericholecystic inflammation. There is common bile duct and central
intrahepatic biliary ductal dilatation without cause for obstruction
or evidence of choledocholithiasis. Recommend correlation with LFTs.
If LFTs are elevated, recommend further evaluation with MRCP.
3. Gastric bypass without complication.
4. Low-density lesions within the anterior uterine fundus may be
secondary to degenerating fibroids or sequela of prior endometrial
ablation, nonspecific by CT.

Aortic Atherosclerosis ([0X]-[0X]).

## 2020-07-13 MED ORDER — HYDROMORPHONE HCL 1 MG/ML IJ SOLN
1.0000 mg | Freq: Once | INTRAMUSCULAR | Status: DC
  Filled 2020-07-13: qty 1

## 2020-07-13 MED ORDER — DEXAMETHASONE SODIUM PHOSPHATE 4 MG/ML IJ SOLN
4.0000 mg | Freq: Once | INTRAMUSCULAR | Status: AC
  Administered 2020-07-14: 4 mg via INTRAVENOUS
  Filled 2020-07-13: qty 1

## 2020-07-13 MED ORDER — ONDANSETRON HCL 4 MG/2ML IJ SOLN
4.0000 mg | Freq: Once | INTRAMUSCULAR | Status: AC
  Administered 2020-07-13: 4 mg via INTRAVENOUS
  Filled 2020-07-13: qty 2

## 2020-07-13 MED ORDER — IOHEXOL 300 MG/ML  SOLN
100.0000 mL | Freq: Once | INTRAMUSCULAR | Status: AC | PRN
  Administered 2020-07-13: 100 mL via INTRAVENOUS

## 2020-07-13 MED ORDER — SODIUM CHLORIDE (PF) 0.9 % IJ SOLN
INTRAMUSCULAR | Status: AC
  Filled 2020-07-13: qty 50

## 2020-07-13 MED ORDER — SODIUM CHLORIDE 0.9 % IV BOLUS
500.0000 mL | Freq: Once | INTRAVENOUS | Status: AC
  Administered 2020-07-13: 500 mL via INTRAVENOUS

## 2020-07-13 MED ORDER — KETOROLAC TROMETHAMINE 15 MG/ML IJ SOLN
15.0000 mg | Freq: Once | INTRAMUSCULAR | Status: AC
  Administered 2020-07-14: 15 mg via INTRAVENOUS
  Filled 2020-07-13: qty 1

## 2020-07-13 MED ORDER — MORPHINE SULFATE (PF) 4 MG/ML IV SOLN
4.0000 mg | Freq: Once | INTRAVENOUS | Status: AC
  Administered 2020-07-13: 4 mg via INTRAVENOUS
  Filled 2020-07-13: qty 1

## 2020-07-13 NOTE — Discharge Instructions (Signed)
We did not find any emergency condition on your CT scan to explain your lower abdominal pain.  It may be pain from a fibroid, or a pinched nerve in your back.  I included a copy of your labs and your CT report.  You can follow-up with your primary care doctor this week.  If you feel like your pain is returning, you have severe pain or vomiting and cannot keep down water for 24 hours, you should return to the ER.  Please note that your CT mentions dilation of your bile ducts.  I did not feel that your blood-tests or your physical exam suggest a gall bladder problem at this time.  If you continue having abdominal pain, your primary care doctor could consider getting an ultrasound of your liver and gallbladder as an outpatient.

## 2020-07-13 NOTE — ED Notes (Addendum)
Patient stating she does not want the covid-19 test unless she needs it for admission.

## 2020-07-13 NOTE — ED Provider Notes (Signed)
[facility]-EMERGENCY DEPT Provider Note   CSN: [id number] Arrival date & time: 07/13/20  1827     History Chief Complaint  Patient presents with  . Leg Pain    Bridget Stevens is a 49 y.o. female with history of tubal ligation, partial hysterectomy with uterus removal, gastric bypass, chronic back pain and sciatica, presenting to the emergency department with lower abdominal pain.  She reports onset approximately 3 days ago.  She had intermittent lower abdominal pain that was sharp for the past 3 days, worsening in the past 24 hours.  It is worse with movement.  She has never had this kind of pain before.  She denies dysuria, hematuria, or history of kidney stone.  She reports poor appetite due to pain, but denies any vomiting or diarrhea.  She does feel like she is having separate pain radiating down the back of her right leg, and states she has a history of chronic back problems and had a lumbar fusion in the past.  Surgical history of partial hysterectomy (uterus), bilateral tubal ligation, gastric bypass, lumbar fusion   HPI     Past Medical History:  Diagnosis Date  . Anxiety   . Arthritis   . Asthma   . GERD (gastroesophageal reflux disease)   . Headache   . Hepatomegaly   . Hx of migraines   . Hyperlipidemia   . Thyroid disease     Patient Active Problem List   Diagnosis Date Noted  . S/P cervical spinal fusion 02/18/2020  . S/P lumbar fusion 09/05/2019  . S/P endometrial ablation - 04/2012 05/29/2012    Past Surgical History:  Procedure Laterality Date  . ANTERIOR CERVICAL DECOMP/DISCECTOMY FUSION N/A 02/18/2020   Procedure: Anterior Cervical Decompression/Discectomy Fusion - Cervical five-Cervcal six;  Surgeon: Tia Alert, MD;  Location: [facility] OR;  Service: Neurosurgery;  Laterality: N/A;  . BTL  07/30/2008  . CARPAL TUNNEL RELEASE Right   . CARPAL TUNNEL RELEASE Left 09/22/2016   Procedure: CARPAL TUNNEL RELEASE, left;  Surgeon: Dairl Ponder, MD;  Location: [facility];  Service: Orthopedics;  Laterality: Left;  . CESAREAN SECTION    . DORSAL COMPARTMENT RELEASE Left 09/22/2016   Procedure: RELEASE DORSAL COMPARTMENT (DEQUERVAIN);  Surgeon: Dairl Ponder, MD;  Location: [facility];  Service: Orthopedics;  Laterality: Left;  . HYSTEROSCOPY W/ ENDOMETRIAL ABLATION    . TUBAL LIGATION    . WISDOM TOOTH EXTRACTION       OB History    Gravida  1   Para  1   Term      Preterm      AB      Living  1     SAB      TAB      Ectopic      Multiple      Live Births              Family History  Problem Relation Age of Onset  . Diabetes Father   . Hypertension Father   . Hyperlipidemia Father   . Thyroid disease Father   . Asthma Father   . Cancer Mother   . Hypertension Mother   . Thyroid disease Mother   . Cancer Sister        CERVICAL  . Thyroid disease Sister   . Asthma Sister   . Asthma Brother   . Breast cancer Neg Hx     Social History   Tobacco  Use  . Smoking status: Never Smoker  . Smokeless tobacco: Never Used  Vaping Use  . Vaping Use: Never used  Substance Use Topics  . Alcohol use: Not Currently    Comment: socially  . Drug use: No    Home Medications Prior to Admission medications   Medication Sig Start Date End Date Taking? Authorizing Provider  acetaminophen (TYLENOL) 500 MG tablet Take 1,000 mg by mouth every 6 (six) hours as needed for moderate pain.    [provider]  albuterol (PROVENTIL HFA;VENTOLIN HFA) 108 (90 Base) MCG/ACT inhaler Inhale 2 puffs into the lungs every 4 (four) hours as needed for wheezing or shortness of breath.     [provider]  Calcium Carb-Cholecalciferol (QC CALCIUM 600 +D3 PO) Take 1 tablet by mouth daily.    [provider]  calcium elemental as carbonate (BARIATRIC TUMS ULTRA) 400 MG chewable tablet Chew 1,000-2,000 mg by mouth 3 (three) times daily as needed for heartburn (acid  reflux/indigestion.).    [provider]  Cholecalciferol (VITAMIN D) 50 MCG (2000 UT) CAPS Take 2,000 Units by mouth every evening.     [provider]  cycloSPORINE (RESTASIS) 0.05 % ophthalmic emulsion Place 1 drop into both eyes 2 (two) times daily as needed (dry/irritated eyes.).     [provider]  EPINEPHrine 0.3 mg/0.3 mL IJ SOAJ injection Inject 0.3 mg into the muscle as needed for anaphylaxis. 11/21/19   [provider]  fluticasone (FLONASE) 50 MCG/ACT nasal spray Place 1 spray into both nostrils daily as needed for allergies.     [provider]  gabapentin (NEURONTIN) 300 MG capsule Take 300 mg by mouth in the morning, at noon, and at bedtime.     [provider]  methocarbamol (ROBAXIN) 500 MG tablet Take 500 mg by mouth in the morning and at bedtime.     [provider]  Olopatadine HCl (PAZEO) 0.7 % SOLN Place 1 drop into both eyes daily.     [provider]  omeprazole (PRILOSEC) 20 MG capsule Take 20 mg by mouth daily.     [provider]  oxyCODONE-acetaminophen (PERCOCET/ROXICET) 5-325 MG tablet Take 1 tablet by mouth every 6 (six) hours as needed. 02/19/20   Tia Alert, MD  Pediatric Multiple Vit-C-FA (MULTIVITAMIN ANIMAL SHAPES, WITH CA/FA,) with C & FA chewable tablet Chew 1 tablet by mouth 2 (two) times daily.    [provider]  polyethylene glycol (MIRALAX / GLYCOLAX) 17 g packet Take 17 g by mouth daily as needed (constipation.).    [provider]  tiZANidine (ZANAFLEX) 4 MG tablet Take 1 tablet (4 mg total) by mouth every 6 (six) hours as needed for muscle spasms. 07/06/20   Bing Neighbors, FNP    Allergies    Shellfish allergy, Peanut-containing drug products, Sulfa antibiotics, Advil [ibuprofen], and Percocet [oxycodone-acetaminophen]  Review of Systems   Review of Systems  Constitutional: Positive for appetite change. Negative for chills and fever.  HENT:  Negative for ear pain and sore throat.   Eyes: Negative for pain and visual disturbance.  Respiratory: Negative for cough and shortness of breath.   Cardiovascular: Negative for chest pain and palpitations.  Gastrointestinal: Positive for abdominal pain and nausea. Negative for vomiting.  Genitourinary: Negative for difficulty urinating and dysuria.  Musculoskeletal: Positive for back pain. Negative for arthralgias.  Skin: Negative for pallor and rash.  Neurological: Negative for syncope and headaches.  Psychiatric/Behavioral: Negative for confusion.  All  other systems reviewed and are negative.   Physical Exam Updated Vital Signs BP (!) 120/54 (BP Location: Right Arm)   Pulse (!) 53   Temp 98.8 F (37.1 C) (Oral)   Resp 17   Ht 4\' 11"  (1.499 m)   Wt 61.1 kg   SpO2 100%   BMI 27.23 kg/m   Physical Exam Vitals and nursing note reviewed.  Constitutional:      General: She is not in acute distress.    Appearance: She is well-developed.  HENT:     Head: Normocephalic and atraumatic.  Eyes:     Conjunctiva/sclera: Conjunctivae normal.  Cardiovascular:     Rate and Rhythm: Normal rate and regular rhythm.     Pulses: Normal pulses.  Pulmonary:     Effort: Pulmonary effort is normal. No respiratory distress.  Abdominal:     Palpations: Abdomen is soft.     Tenderness: There is abdominal tenderness. There is no right CVA tenderness or left CVA tenderness. Positive signs include McBurney's sign. Negative signs include Murphy's sign.     Comments: Tenderness in RLQ and right inguinal region  Musculoskeletal:     Cervical back: Neck supple.  Skin:    General: Skin is warm and dry.  Neurological:     General: No focal deficit present.     Mental Status: She is alert.     Sensory: No sensory deficit.     Motor: No weakness.     Comments: +Straight leg test on the right  Psychiatric:        Mood and Affect: Mood normal.        Behavior: Behavior normal.     ED Results /  Procedures / Treatments   Labs (all labs ordered are listed, but only abnormal results are displayed) Labs Reviewed  COMPREHENSIVE METABOLIC PANEL - Abnormal; Notable for the following components:      Result Value   ALT 45 (*)    All other components within normal limits  RESPIRATORY PANEL BY RT PCR (FLU A&B, COVID)  CBC WITH DIFFERENTIAL/PLATELET    EKG None  Radiology CT ABDOMEN PELVIS W CONTRAST  Result Date: 07/13/2020 CLINICAL DATA:  Abdominal distension. Right lower quadrant pain and tenderness to palpation. EXAM: CT ABDOMEN AND PELVIS WITH CONTRAST TECHNIQUE: Multidetector CT imaging of the abdomen and pelvis was performed using the standard protocol following bolus administration of intravenous contrast. CONTRAST:  07/15/2020 OMNIPAQUE IOHEXOL 300 MG/ML  SOLN COMPARISON:  None. FINDINGS: Lower chest: Lung bases are clear. Hepatobiliary: No focal hepatic abnormality. Mild gallbladder distention but no calcified gallstone or pericholecystic inflammation. The common bile duct is dilated at 10 mm at the porta hepatis, 10 mm in the distal portion. No cause for obstruction or evidence of choledocholithiasis. There is minimal central intrahepatic biliary ductal dilatation. Pancreas: No ductal dilatation or inflammation. Spleen: Normal in size without focal abnormality. Splenule inferiorly. Adrenals/Urinary Tract: Normal adrenal glands. No hydronephrosis or perinephric edema. Homogeneous renal enhancement with symmetric excretion on delayed phase imaging. Urinary bladder is physiologically distended without wall thickening. Stomach/Bowel: Bowel evaluation is limited in the absence of enteric contrast. Gastric bypass. Excluded gastric remnant contains small amount of fluid. The Roux limb is nondilated. The jejunal anastomosis is unremarkable. There is no small bowel obstruction or inflammatory change. Normal air-filled appendix courses inferior to the liver, series 3, image 37. No appendicitis. No  terminal ileal inflammation. Moderate stool burden in the colon. No colonic wall thickening. No pericolonic edema. Vascular/Lymphatic: Trace aorto  bi-iliac atherosclerosis. No aortic aneurysm. The portal vein is patent. No abdominopelvic adenopathy. Reproductive: Peripherally enhancing low-density lesions in the anterior uterine fundus uterus, largest measuring 14 mm, may represent degenerating fibroids or be secondary to prior endometrial ablation. No adnexal mass. Other: No free air, free fluid, or intra-abdominal fluid collection. Musculoskeletal: Posterior fusion L4-S1 with interbody spacers. Hardware is intact. There are no acute or suspicious osseous abnormalities. Degenerative change of the sacroiliac joints. IMPRESSION: 1. Normal appendix. 2. Mild gallbladder distention without calcified gallstone or pericholecystic inflammation. There is common bile duct and central intrahepatic biliary ductal dilatation without cause for obstruction or evidence of choledocholithiasis. Recommend correlation with LFTs. If LFTs are elevated, recommend further evaluation with MRCP. 3. Gastric bypass without complication. 4. Low-density lesions within the anterior uterine fundus may be secondary to degenerating fibroids or sequela of prior endometrial ablation, nonspecific by CT. Aortic Atherosclerosis (ICD10-I70.0). Electronically Signed   By: Narda Rutherford M.D.   On: 07/13/2020 22:33    Procedures Procedures (including critical care time)  Medications Ordered in ED Medications  morphine 4 MG/ML injection 4 mg (4 mg Intravenous Given 07/13/20 2038)  ondansetron (ZOFRAN) injection 4 mg (4 mg Intravenous Given 07/13/20 2038)  sodium chloride 0.9 % bolus 500 mL (0 mLs Intravenous Stopped 07/13/20 2331)  iohexol (OMNIPAQUE) 300 MG/ML solution 100 mL (100 mLs Intravenous Contrast Given 07/13/20 2155)  ketorolac (TORADOL) 15 MG/ML injection 15 mg (15 mg Intravenous Given 07/14/20 0017)  dexamethasone (DECADRON) injection  4 mg (4 mg Intravenous Given 07/14/20 0017)    ED Course  I have reviewed the triage vital signs and the nursing notes.  Pertinent labs & imaging results that were available during my care of the patient were reviewed by me and considered in my medical decision making (see chart for details).  49 year old female presenting with 3 days of intermittent right lower abdominal pain and right inguinal pain, also radiating down her right leg  This involves an extensive number of treatment options, and is a complaint that carries with it a high risk of complications and morbidity.  The differential diagnosis includes appendicitis vs hernia vs diverticulitis vs colitis vs sciatica vs paresthetica meralgia vs other  Less likely acute biliary disease with no focal RUQ ttp Less likely ovarian torsion given her age and hx of tubal ligation Less likely pyelonephritis with no flank ttp, no fever  I ordered, reviewed, and interpreted labs, which included CBC (WBC 8.3, Hgb 13.9), CMP (largely unremarkable), Covid negative I ordered medication IV morphine, IV toradol, IV dilaudid, IV zofran, IV decadron, IV fluid NS 500 cc for pain, nausea I ordered imaging studies which included CT abdomen pelvis with IV contrast, which demonstrated findings as noted in ED course below   Clinical Course as of Jul 14 942  Sun Jul 13, 2020  2239  IMPRESSION: 1. Normal appendix. 2. Mild gallbladder distention without calcified gallstone or pericholecystic inflammation. There is common bile duct and central intrahepatic biliary ductal dilatation without cause for obstruction or evidence of choledocholithiasis. Recommend correlation with LFTs. If LFTs are elevated, recommend further evaluation with MRCP. 3. Gastric bypass without complication. 4. Low-density lesions within the anterior uterine fundus may be secondary to degenerating fibroids or sequela of prior endometrial ablation, nonspecific by CT.    [MT]  2239  LFT's normal here   [MT]  2244 I suspect the biliary dilation noted on CT scan is likely chronic.  She has no RUQ tenderness on exam, or fever or  leukocytosis to suggest acute cholecystitis.  No report of intraabdominal obstructing mass or lesion.  Her symptoms are truly localized in her RLQ and reproducible with even light touch on exam.  This is not suggestive to me of a vascular injury either.  This may be lymphadenopathy or sciatica or a nerve issue in her leg.   [MT]  2326 Pt continues having abdominal pain, with only minor improvement after her first dose of dilaudid.  I explained that with her current workup, including CT imaging and bloodtests, I have a fairly low suspicion for an acute emergent process.  We can give another round of pain meds including a single dose of NSAID here and some decadron as well.  I offered her a prescription for pain medications but she says her insurance won't cover percocet anymore, and she does not want to pay out of pocket.  She cannot take long-term NSAIDS due to her bypass.  She already has tylenol and muscle relaxers at home.  We will try to improve her pain and discharge with close PCP follow up.   [MT]    Clinical Course User Index [MT] Ciarrah Rae, Kermit BaloMatthew J, MD    Final Clinical Impression(s) / ED Diagnoses Final diagnoses:  Right lower quadrant abdominal pain    Rx / DC Orders ED Discharge Orders    None       Terald Sleeperrifan, Taunia Frasco J, MD 07/14/20 [phone]

## 2020-07-13 NOTE — ED Triage Notes (Signed)
Patient reports to the ER for right sided leg pain and pelvic area pain. Patient reports she is having issues with it radiating. Patient states she has had back surgery in the past

## 2020-07-18 ENCOUNTER — Other Ambulatory Visit: Payer: Self-pay

## 2020-07-18 ENCOUNTER — Ambulatory Visit
Admission: RE | Admit: 2020-07-18 | Discharge: 2020-07-18 | Disposition: A | Discharge status: Home / Self Care | Payer: Medicaid Other | Source: Ambulatory Visit | Attending: Internal Medicine | Admitting: Internal Medicine

## 2020-07-18 DIAGNOSIS — Z1231 Encounter for screening mammogram for malignant neoplasm of breast: Secondary | ICD-10-CM

## 2020-07-18 IMAGING — MG DIGITAL SCREENING BILAT W/ TOMO W/ CAD
6 of 10 series · 6 of 30 positions shown · non-contrast
Comparison: Previous exam(s).

CLINICAL DATA: Screening.

EXAM:
DIGITAL SCREENING BILATERAL MAMMOGRAM WITH TOMO AND CAD

[R CC synth-2D]
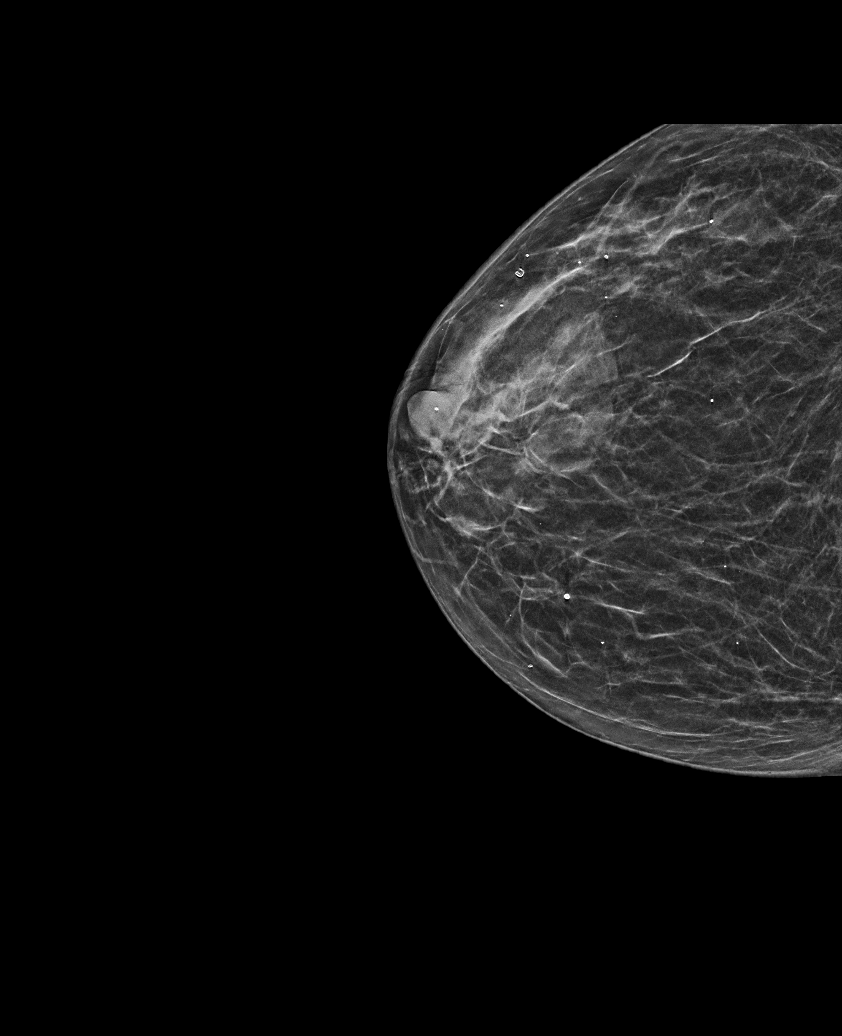

[L MLO synth-2D]
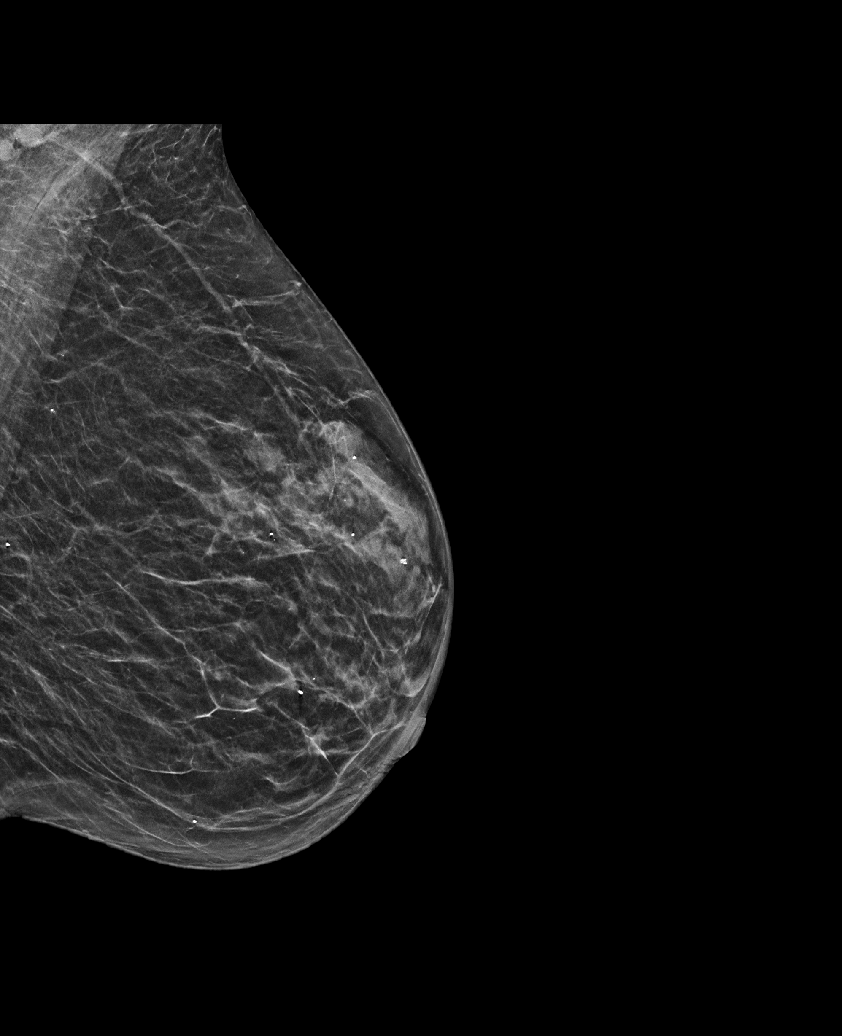

[R MLO synth-2D (1 of 2)]
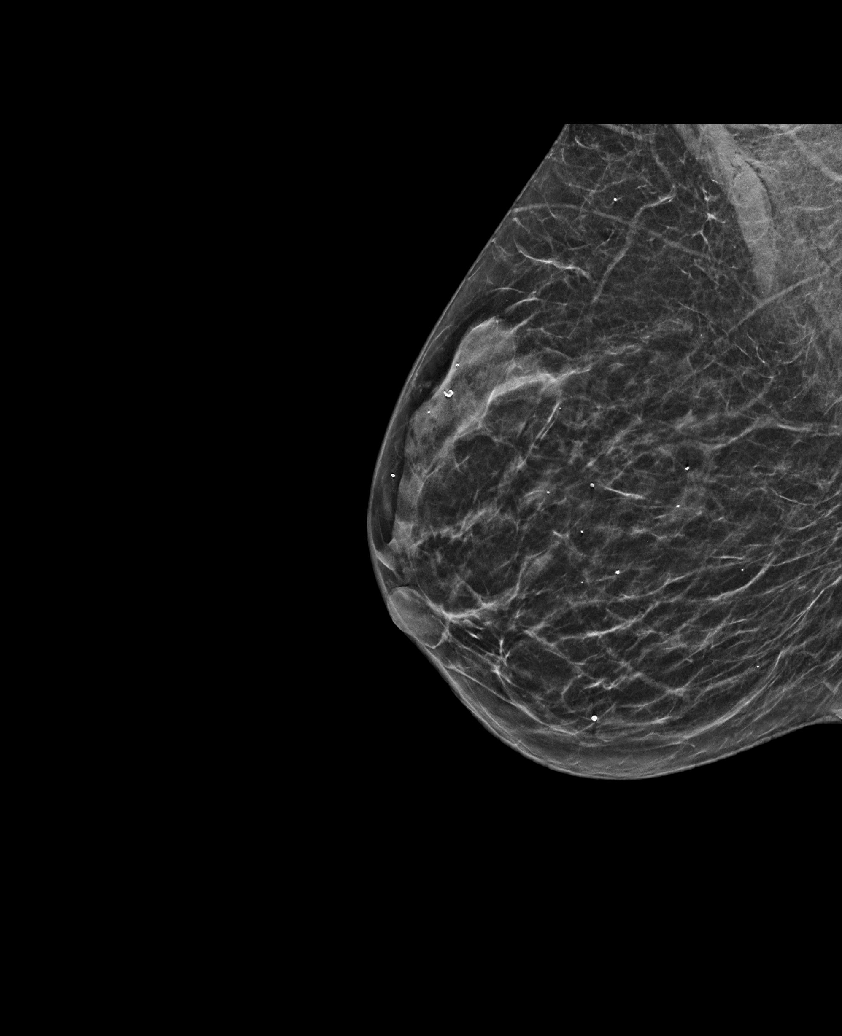

[L CC synth-2D]
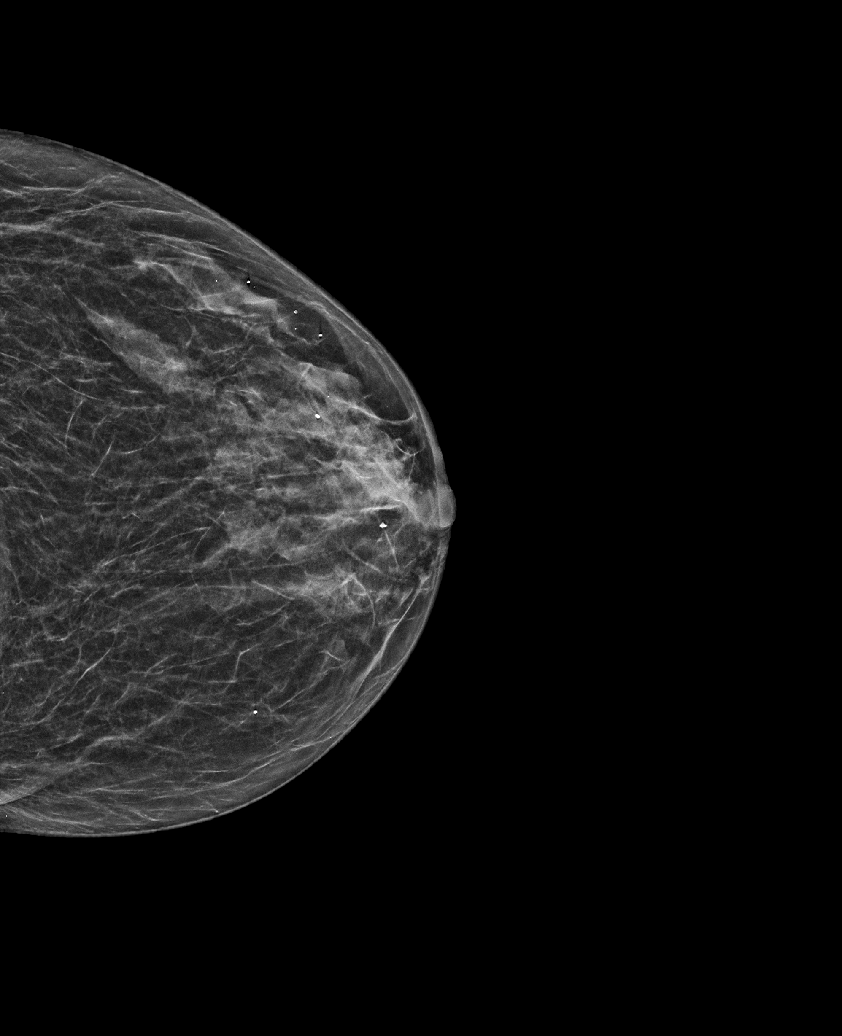

[R MLO synth-2D (2 of 2)]
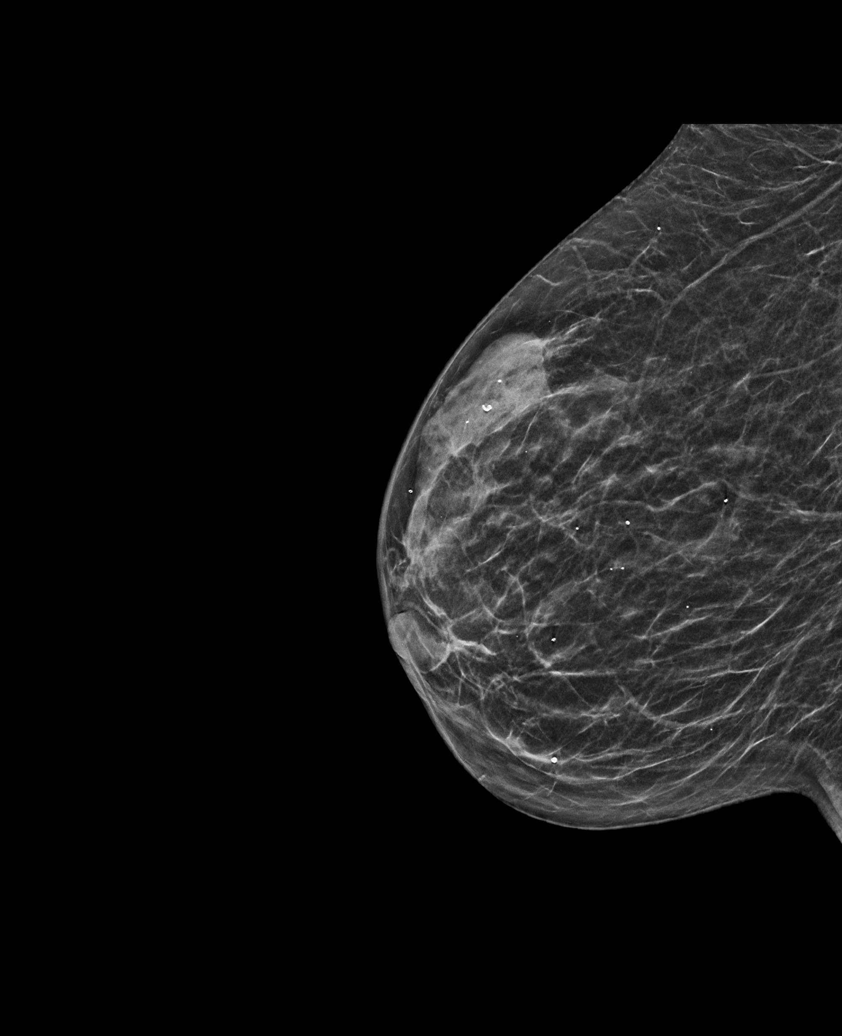

[L MLO tomo · tomo slice 25/48.0]
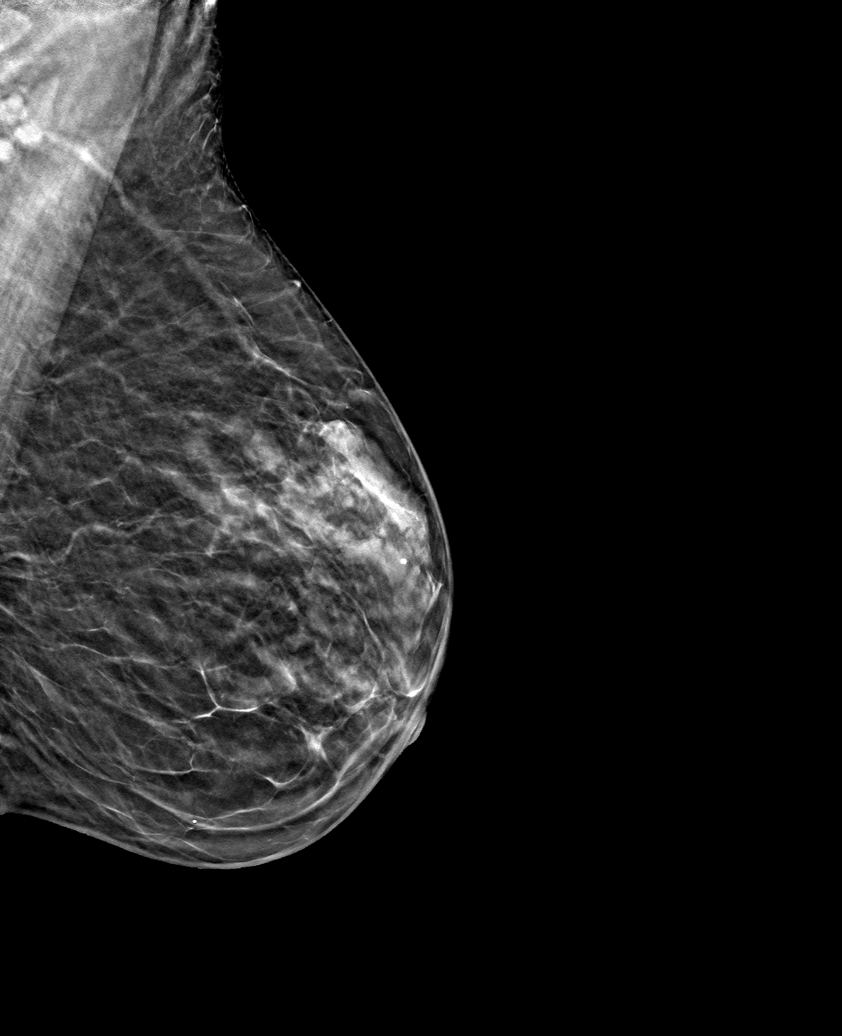

[6 of 30 positions shown; findings below may reference images not displayed]

ACR Breast Density Category c: The breast tissue is heterogeneously
dense, which may obscure small masses.
FINDINGS: There are no findings suspicious for malignancy. Images were
processed with CAD.
IMPRESSION: No mammographic evidence of malignancy. A result letter of this
screening mammogram will be mailed directly to the patient.

RECOMMENDATION:
Screening mammogram in one year. (Code:[5V])

BI-RADS CATEGORY  1: Negative.

## 2020-08-11 ENCOUNTER — Other Ambulatory Visit: Payer: Self-pay

## 2020-08-11 ENCOUNTER — Emergency Department (HOSPITAL_COMMUNITY)
Admission: EM | Admit: 2020-08-11 | Discharge: 2020-08-11 | Disposition: A | Discharge status: Home / Self Care | Payer: Medicaid Other | Attending: Emergency Medicine | Admitting: Emergency Medicine

## 2020-08-11 ENCOUNTER — Encounter (HOSPITAL_COMMUNITY): Payer: Self-pay

## 2020-08-11 DIAGNOSIS — R1031 Right lower quadrant pain: Secondary | ICD-10-CM

## 2020-08-11 DIAGNOSIS — Z7952 Long term (current) use of systemic steroids: Secondary | ICD-10-CM | POA: Diagnosis not present

## 2020-08-11 DIAGNOSIS — J45909 Unspecified asthma, uncomplicated: Secondary | ICD-10-CM | POA: Diagnosis not present

## 2020-08-11 DIAGNOSIS — Z91010 Allergy to peanuts: Secondary | ICD-10-CM | POA: Diagnosis not present

## 2020-08-11 DIAGNOSIS — N390 Urinary tract infection, site not specified: Secondary | ICD-10-CM

## 2020-08-11 DIAGNOSIS — M79604 Pain in right leg: Secondary | ICD-10-CM | POA: Insufficient documentation

## 2020-08-11 LAB — LIPASE, BLOOD: Lipase: 35 U/L (ref 11–51)

## 2020-08-11 LAB — COMPREHENSIVE METABOLIC PANEL
ALT: 36 U/L (ref 0–44)
AST: 31 U/L (ref 15–41)
Albumin: 4.3 g/dL (ref 3.5–5.0)
Alkaline Phosphatase: 74 U/L (ref 38–126)
Anion gap: 9 (ref 5–15)
BUN: 12 mg/dL (ref 6–20)
CO2: 25 mmol/L (ref 22–32)
Calcium: 9.1 mg/dL (ref 8.9–10.3)
Chloride: 106 mmol/L (ref 98–111)
Creatinine, Ser: 0.56 mg/dL (ref 0.44–1.00)
GFR, Estimated: >60 mL/min (ref >60)
Glucose, Bld: 90 mg/dL (ref 70–99)
Potassium: 4.1 mmol/L (ref 3.5–5.1)
Sodium: 140 mmol/L (ref 135–145)
Total Bilirubin: 0.7 mg/dL (ref 0.3–1.2)
Total Protein: 7.4 g/dL (ref 6.5–8.1)

## 2020-08-11 LAB — URINALYSIS, ROUTINE W REFLEX MICROSCOPIC
Bilirubin Urine: NEGATIVE
Glucose, UA: NEGATIVE mg/dL
Hgb urine dipstick: NEGATIVE
Ketones, ur: NEGATIVE mg/dL
Nitrite: NEGATIVE
Protein, ur: NEGATIVE mg/dL
Specific Gravity, Urine: 1.011 (ref 1.005–1.030)
WBC, UA: >50 WBC/hpf — ABNORMAL HIGH (ref 0–5)
pH: 5 (ref 5.0–8.0)

## 2020-08-11 LAB — CBC
HCT: 40 % (ref 36.0–46.0)
Hemoglobin: 13.3 g/dL (ref 12.0–15.0)
MCH: 32.6 pg (ref 26.0–34.0)
MCHC: 33.3 g/dL (ref 30.0–36.0)
MCV: 98 fL (ref 80.0–100.0)
Platelets: 247 10*3/uL (ref 150–400)
RBC: 4.08 MIL/uL (ref 3.87–5.11)
RDW: 13 % (ref 11.5–15.5)
WBC: 6.7 10*3/uL (ref 4.0–10.5)
nRBC: 0 % (ref 0.0–0.2)

## 2020-08-11 MED ORDER — HYDROCODONE-ACETAMINOPHEN 5-325 MG PO TABS: 1.0000 | ORAL_TABLET | Freq: Four times a day (QID) | ORAL | 0 refills | Status: DC | PRN

## 2020-08-11 MED ORDER — MORPHINE SULFATE (PF) 4 MG/ML IV SOLN
4.0000 mg | Freq: Once | INTRAVENOUS | Status: AC
  Administered 2020-08-11: 4 mg via INTRAVENOUS
  Filled 2020-08-11: qty 1

## 2020-08-11 MED ORDER — ONDANSETRON HCL 4 MG/2ML IJ SOLN
4.0000 mg | Freq: Once | INTRAMUSCULAR | Status: AC
  Administered 2020-08-11: 4 mg via INTRAVENOUS
  Filled 2020-08-11: qty 2

## 2020-08-11 MED ORDER — CEPHALEXIN 500 MG PO CAPS: 500.0000 mg | ORAL_CAPSULE | Freq: Four times a day (QID) | ORAL | 0 refills | Status: DC

## 2020-08-11 MED ORDER — SODIUM CHLORIDE 0.9 % IV SOLN
1.0000 g | Freq: Once | INTRAVENOUS | Status: AC
  Administered 2020-08-11: 1 g via INTRAVENOUS
  Filled 2020-08-11: qty 10

## 2020-08-11 NOTE — Discharge Instructions (Signed)
You were seen in the emergency department for lower abdominal pain. Your blood work was unremarkable but your urinalysis did show signs of infection. We gave you an IV dose of antibiotics and some pain medicine and are sending you home with oral antibiotics and pain medicine. Please follow-up closely with your gynecologist and primary care doctor. Return to the emergency department if any worsening or concerning symptoms

## 2020-08-11 NOTE — ED Provider Notes (Signed)
[facility]-EMERGENCY DEPT Provider Note   CSN: [id number] Arrival date & time: 08/11/20  1536     History Chief Complaint  Patient presents with  . Abdominal Pain    Bridget Stevens is a 49 y.o. female.  She has been having trouble with right lower abdominal pain for about a month on and off.  She was here and had a CAT scan that showed some abnormality and she followed up with her doctor had an ultrasound that showed possible hemorrhagic cyst.  She has an appointment with gynecology in a few weeks but the pain worsened today.  No fevers.  No nausea or vomiting.  No urinary symptoms.  She has a history of a gastric bypass and cannot take NSAIDs.  The history is provided by the patient.  Abdominal Pain Pain location:  RLQ Pain quality: aching   Pain radiates to:  R leg Pain severity:  Severe Onset quality:  Gradual Timing:  Intermittent Progression:  Worsening Chronicity:  New Relieved by:  Nothing Worsened by:  Palpation Ineffective treatments:  Acetaminophen Associated symptoms: no chest pain, no cough, no dysuria, no fever, no hematemesis, no hematochezia, no hematuria, no nausea, no shortness of breath, no sore throat, no vaginal bleeding and no vomiting        Past Medical History:  Diagnosis Date  . Anxiety   . Arthritis   . Asthma   . GERD (gastroesophageal reflux disease)   . Headache   . Hepatomegaly   . Hx of migraines   . Hyperlipidemia   . Thyroid disease     Patient Active Problem List   Diagnosis Date Noted  . S/P cervical spinal fusion 02/18/2020  . S/P lumbar fusion 09/05/2019  . S/P endometrial ablation - 04/2012 05/29/2012    Past Surgical History:  Procedure Laterality Date  . ANTERIOR CERVICAL DECOMP/DISCECTOMY FUSION N/A 02/18/2020   Procedure: Anterior Cervical Decompression/Discectomy Fusion - Cervical five-Cervcal six;  Surgeon: Tia Alert, MD;  Location: [facility] OR;  Service: Neurosurgery;  Laterality: N/A;  . BTL   07/30/2008  . CARPAL TUNNEL RELEASE Right   . CARPAL TUNNEL RELEASE Left 09/22/2016   Procedure: CARPAL TUNNEL RELEASE, left;  Surgeon: Dairl Ponder, MD;  Location: [facility];  Service: Orthopedics;  Laterality: Left;  . CESAREAN SECTION    . DORSAL COMPARTMENT RELEASE Left 09/22/2016   Procedure: RELEASE DORSAL COMPARTMENT (DEQUERVAIN);  Surgeon: Dairl Ponder, MD;  Location: [facility];  Service: Orthopedics;  Laterality: Left;  . HYSTEROSCOPY W/ ENDOMETRIAL ABLATION    . TUBAL LIGATION    . WISDOM TOOTH EXTRACTION       OB History    Gravida  1   Para  1   Term      Preterm      AB      Living  1     SAB      TAB      Ectopic      Multiple      Live Births              Family History  Problem Relation Age of Onset  . Diabetes Father   . Hypertension Father   . Hyperlipidemia Father   . Thyroid disease Father   . Asthma Father   . Cancer Mother   . Hypertension Mother   . Thyroid disease Mother   . Cancer Sister        CERVICAL  .  Thyroid disease Sister   . Asthma Sister   . Asthma Brother   . Breast cancer Maternal Grandmother        in her 8880s    Social History   Tobacco Use  . Smoking status: Never Smoker  . Smokeless tobacco: Never Used  Vaping Use  . Vaping Use: Never used  Substance Use Topics  . Alcohol use: Not Currently    Comment: socially  . Drug use: No    Home Medications Prior to Admission medications   Medication Sig Start Date End Date Taking? Authorizing Provider  acetaminophen (TYLENOL) 500 MG tablet Take 1,000 mg by mouth every 6 (six) hours as needed for moderate pain.    [provider]  albuterol (PROVENTIL HFA;VENTOLIN HFA) 108 (90 Base) MCG/ACT inhaler Inhale 2 puffs into the lungs every 4 (four) hours as needed for wheezing or shortness of breath.     [provider]  Calcium Carb-Cholecalciferol (QC CALCIUM 600 +D3 PO) Take 1 tablet by mouth daily.     [provider]  calcium elemental as carbonate (BARIATRIC TUMS ULTRA) 400 MG chewable tablet Chew 1,000-2,000 mg by mouth 3 (three) times daily as needed for heartburn (acid reflux/indigestion.).    [provider]  Cholecalciferol (VITAMIN D) 50 MCG (2000 UT) CAPS Take 2,000 Units by mouth every evening.     [provider]  cycloSPORINE (RESTASIS) 0.05 % ophthalmic emulsion Place 1 drop into both eyes 2 (two) times daily as needed (dry/irritated eyes.).     [provider]  EPINEPHrine 0.3 mg/0.3 mL IJ SOAJ injection Inject 0.3 mg into the muscle as needed for anaphylaxis. 11/21/19   [provider]  fluticasone (FLONASE) 50 MCG/ACT nasal spray Place 1 spray into both nostrils daily as needed for allergies.     [provider]  gabapentin (NEURONTIN) 300 MG capsule Take 300 mg by mouth in the morning, at noon, and at bedtime.     [provider]  methocarbamol (ROBAXIN) 500 MG tablet Take 500 mg by mouth in the morning and at bedtime.     [provider]  Olopatadine HCl (PAZEO) 0.7 % SOLN Place 1 drop into both eyes daily.     [provider]  omeprazole (PRILOSEC) 20 MG capsule Take 20 mg by mouth daily.     [provider]  oxyCODONE-acetaminophen (PERCOCET/ROXICET) 5-325 MG tablet Take 1 tablet by mouth every 6 (six) hours as needed. 02/19/20   Tia AlertJones, David S, MD  Pediatric Multiple Vit-C-FA (MULTIVITAMIN ANIMAL SHAPES, WITH CA/FA,) with C & FA chewable tablet Chew 1 tablet by mouth 2 (two) times daily.    [provider]  polyethylene glycol (MIRALAX / GLYCOLAX) 17 g packet Take 17 g by mouth daily as needed (constipation.).    [provider]  tiZANidine (ZANAFLEX) 4 MG tablet Take 1 tablet (4 mg total) by mouth every 6 (six) hours as needed for muscle spasms. 07/06/20   Bing NeighborsHarris, Kimberly S, FNP    Allergies    Shellfish allergy, Peanut-containing drug products, Sulfa antibiotics, Advil  [ibuprofen], and Percocet [oxycodone-acetaminophen]  Review of Systems   Review of Systems  Constitutional: Negative for fever.  HENT: Negative for sore throat.   Eyes: Negative for visual disturbance.  Respiratory: Negative for cough and shortness of breath.   Cardiovascular: Negative for chest pain.  Gastrointestinal: Positive for abdominal pain. Negative for hematemesis, hematochezia, nausea and vomiting.  Genitourinary: Negative for dysuria, hematuria and vaginal bleeding.  Musculoskeletal:  Positive for back pain.  Skin: Negative for rash.  Neurological: Negative for headaches.    Physical Exam Updated Vital Signs BP 135/82   Pulse 86   Temp 98.8 F (37.1 C) (Oral)   Resp 20   Ht 4\' 11"  (1.499 m)   Wt 60.5 kg   SpO2 98%   BMI 26.94 kg/m   Physical Exam Vitals and nursing note reviewed.  Constitutional:      General: She is not in acute distress.    Appearance: Normal appearance. She is well-developed.  HENT:     Head: Normocephalic and atraumatic.  Eyes:     Conjunctiva/sclera: Conjunctivae normal.  Cardiovascular:     Rate and Rhythm: Normal rate and regular rhythm.     Heart sounds: No murmur heard.   Pulmonary:     Effort: Pulmonary effort is normal. No respiratory distress.     Breath sounds: Normal breath sounds.  Abdominal:     Palpations: Abdomen is soft.     Tenderness: There is abdominal tenderness in the right lower quadrant. There is no guarding or rebound.  Musculoskeletal:        General: No deformity or signs of injury. Normal range of motion.     Cervical back: Neck supple.  Skin:    General: Skin is warm and dry.     Capillary Refill: Capillary refill takes less than 2 seconds.  Neurological:     General: No focal deficit present.     Mental Status: She is alert.     ED Results / Procedures / Treatments   Labs (all labs ordered are listed, but only abnormal results are displayed) Labs Reviewed  URINALYSIS, ROUTINE W REFLEX  MICROSCOPIC - Abnormal; Notable for the following components:      Result Value   APPearance CLOUDY (*)    Leukocytes,Ua LARGE (*)    WBC, UA >50 (*)    Bacteria, UA RARE (*)    All other components within normal limits  URINE CULTURE  LIPASE, BLOOD  COMPREHENSIVE METABOLIC PANEL  CBC    EKG None  Radiology No results found.  Procedures Procedures (including critical care time)  Medications Ordered in ED Medications  morphine 4 MG/ML injection 4 mg (4 mg Intravenous Given 08/11/20 1952)  ondansetron (ZOFRAN) injection 4 mg (4 mg Intravenous Given 08/11/20 1952)  cefTRIAXone (ROCEPHIN) 1 g in sodium chloride 0.9 % 100 mL IVPB (0 g Intravenous Stopped 08/11/20 2120)    ED Course  I have reviewed the triage vital signs and the nursing notes.  Pertinent labs & imaging results that were available during my care of the patient were reviewed by me and considered in my medical decision making (see chart for details).  Clinical Course as of Aug 13 1015  Mon Aug 11, 2020  Aug 13, 2020 Procedure Note  1610, MD - 07/25/2020  Formatting of this note might be different from the original.  TECHNIQUE: Transvaginal pelvic ultrasound with incidental color flow Doppler and spectral analysis.   INDICATION:Intermittent pelvic pain and right-sidedtenderness.   COMPARISON: No prior studies or reports are made available for comparison or correlation. There is a history of prior CT suggesting uterine mass/fibroid. Patient also apparently underwent endoscopic ablation in July 2013 and tubal ligation in 2009.   FINDINGS:   Uterus: 8 .4 x 4 by 4.5 cm anteverted uterus has a 1 mm endometrial stripe. There is a fluid pocket with avascular soft tissue debris in the cervix. There is also a  9 mm fluid pocket in the endometrial canal at the level of thefundus.   RIGHT ovary: 3.3 x 2.2 x 3.1 cm. A septated cyst is present measuring 3.1 x 2.2 x 3.3 cm. The cyst itself is avascular but blood  flow is present within the adjacent ovarian tissues. Adjacent the ovary is a 13 x 14 mm hypoechoic structure withinternal echoes but no vascularity on Doppler evaluation, likely a hemorrhagic cyst.   LEFT ovary: 3.2 x 1.3 x 2.5 cm. Arterial and venous blood flow are confirmed with normal spectral waveforms. No adjacent masses or fluid collections.   Additional findings: Trace amount of fluid in the cul-de-sac    IMPRESSION:  1. Fluid collections in the uterine fundus and cervix. Cervical collection appears to contain some soft tissue, possibly blood  2. Complicated cysts in an adjacent the right ovary.     [MB]  1936 I reviewed her prior work-up with her and also looked at her prior ultrasound results.  She says this is the same pain and does not feel like this is anything different.  She said Medicaid will not cover her getting Percocet.  Cannot take NSAIDs.  She would like to get an IV dose of something here but she does not think she needs another CAT scan.   [MB]    Clinical Course User Index [MB] Terrilee Files, MD   MDM Rules/Calculators/A&P                         This patient complains of lower abdominal pain for over a month getting worse; this involves an extensive number of treatment Options and is a complaint that carries with it a high risk of complications and Morbidity. The differential includes appendicitis, ovarian cyst, diverticulitis, UTI, pyelonephritis, kidney stone  I ordered, reviewed and interpreted labs, which included CBC with normal white count normal hemoglobin, chemistries normal, LFTs normal, urinalysis with greater than 50 white blood cells nitrite negative I ordered medication IV fluids and pain medicine, IV antibiotics Previous records obtained and reviewed in epic including prior ED visit and CAT scan findings and prior ultrasound by PCP.  After the interventions stated above, I reevaluated the patient and found patient symptoms to be controlled.  I  reviewed her work-up with her and she is comfortable plan for home with oral pain medicine and antibiotics and close follow-up with gynecology regarding her ovarian cyst.  Return instructions discussed   Final Clinical Impression(s) / ED Diagnoses Final diagnoses:  Right lower quadrant abdominal pain  Lower urinary tract infectious disease    Rx / DC Orders ED Discharge Orders         Ordered    cephALEXin (KEFLEX) 500 MG capsule  4 times daily        08/11/20 2056    HYDROcodone-acetaminophen (NORCO/VICODIN) 5-325 MG tablet  Every 6 hours PRN        08/11/20 2056           Terrilee Files, MD 08/12/20 1019

## 2020-08-11 NOTE — ED Notes (Signed)
Gave patient a cup and request urine from patient.

## 2020-08-11 NOTE — ED Triage Notes (Signed)
Patient c/o intermittent right lower abdominal pain x 1 month, but started today. Patient states she has a known ovarian cyst. Patient states her appointment with OB/GYN is not until November. Patient states the pain radiates into the right leg and hip.

## 2020-08-13 LAB — URINE CULTURE

## 2020-08-28 ENCOUNTER — Other Ambulatory Visit: Payer: Self-pay

## 2020-08-28 ENCOUNTER — Other Ambulatory Visit (HOSPITAL_COMMUNITY)
Admission: RE | Admit: 2020-08-28 | Discharge: 2020-08-28 | Disposition: A | Discharge status: Home / Self Care | Payer: Medicaid Other | Source: Ambulatory Visit | Attending: Obstetrics & Gynecology | Admitting: Obstetrics & Gynecology

## 2020-08-28 ENCOUNTER — Encounter: Payer: Self-pay | Admitting: Obstetrics & Gynecology

## 2020-08-28 ENCOUNTER — Ambulatory Visit (INDEPENDENT_AMBULATORY_CARE_PROVIDER_SITE_OTHER): Payer: Medicaid Other | Admitting: Obstetrics & Gynecology

## 2020-08-28 VITALS — BP 109/69 | HR 74 | Ht 59.0 in | Wt 131.0 lb

## 2020-08-28 DIAGNOSIS — N83201 Unspecified ovarian cyst, right side: Secondary | ICD-10-CM

## 2020-08-28 DIAGNOSIS — R102 Pelvic and perineal pain: Secondary | ICD-10-CM

## 2020-08-28 DIAGNOSIS — Z124 Encounter for screening for malignant neoplasm of cervix: Secondary | ICD-10-CM | POA: Insufficient documentation

## 2020-08-28 DIAGNOSIS — N83209 Unspecified ovarian cyst, unspecified side: Secondary | ICD-10-CM | POA: Insufficient documentation

## 2020-08-28 NOTE — Patient Instructions (Signed)
Ovarian Cyst An ovarian cyst is a fluid-filled sac on an ovary. The ovaries are organs that make eggs in women. Most ovarian cysts go away on their own and are not cancerous (are benign). Some cysts need treatment. Follow these instructions at home:  Take over-the-counter and prescription medicines only as told by your doctor.  Do not drive or use heavy machinery while taking prescription pain medicine.  Get pelvic exams and Pap tests as often as told by your doctor.  Return to your normal activities as told by your doctor. Ask your doctor what activities are safe for you.  Do not use any products that contain nicotine or tobacco, such as cigarettes and e-cigarettes. If you need help quitting, ask your doctor.  Keep all follow-up visits as told by your doctor. This is important. Contact a doctor if:  Your periods are: ? Late. ? Irregular. ? Painful.   Your periods stop.  You have pelvic pain that does not go away.  You have pressure on your bladder.  You have trouble making your bladder empty when you pee (urinate).  You have pain during sex.  You have any of the following in your belly (abdomen): ? A feeling of fullness. ? Pressure. ? Discomfort. ? Pain that does not go away. ? Swelling.  You feel sick most of the time.  You have trouble pooping (have constipation).  You are not as hungry as usual (you lose your appetite).  You get very bad acne.  You start to have more hair on your body and face.  You are gaining weight or losing weight without changing your exercise and eating habits.  You think you may be pregnant. Get help right away if:  You have belly pain that is very bad or gets worse.  You cannot eat or drink without throwing up (vomiting).  You suddenly get a fever.  Your period is a lot heavier than usual. This information is not intended to replace advice given to you by your health care provider. Make sure you discuss any questions you have  with your health care provider. Document Revised: 09/16/2017 Document Reviewed: 03/07/2016 Elsevier Patient Education  2020 [employer]  

## 2020-08-28 NOTE — Progress Notes (Signed)
Patient ID: Bridget Stevens, female   DOB: [birth year], 49 y.o.   MRN: [id number]  Chief Complaint  Patient presents with  . New Patient (Initial Visit)    HPI Bridget Stevens is a 49 y.o. female.  G1P1 No LMP recorded. Patient has had an ablation. She began with sharp RLQ 1-2 months ago. Imaging was done last month and right ovarian cyst with septations possible hemorrhagic was identified. She still has pain. She was treated for UTI 10/25 HPI  Past Medical History:  Diagnosis Date  . Anxiety   . Arthritis   . Asthma   . GERD (gastroesophageal reflux disease)   . Headache   . Hepatomegaly   . Hx of migraines   . Hyperlipidemia   . Thyroid disease     Past Surgical History:  Procedure Laterality Date  . ANTERIOR CERVICAL DECOMP/DISCECTOMY FUSION N/A 02/18/2020   Procedure: Anterior Cervical Decompression/Discectomy Fusion - Cervical five-Cervcal six;  Surgeon: Tia Alert, MD;  Location: [facility] OR;  Service: Neurosurgery;  Laterality: N/A;  . BTL  07/30/2008  . CARPAL TUNNEL RELEASE Right   . CARPAL TUNNEL RELEASE Left 09/22/2016   Procedure: CARPAL TUNNEL RELEASE, left;  Surgeon: Dairl Ponder, MD;  Location: [facility];  Service: Orthopedics;  Laterality: Left;  . CESAREAN SECTION    . DORSAL COMPARTMENT RELEASE Left 09/22/2016   Procedure: RELEASE DORSAL COMPARTMENT (DEQUERVAIN);  Surgeon: Dairl Ponder, MD;  Location:  SURGERY CENTER;  Service: Orthopedics;  Laterality: Left;  . HYSTEROSCOPY W/ ENDOMETRIAL ABLATION    . TUBAL LIGATION    . WISDOM TOOTH EXTRACTION      Family History  Problem Relation Age of Onset  . Diabetes Father   . Hypertension Father   . Hyperlipidemia Father   . Thyroid disease Father   . Asthma Father   . Cancer Mother   . Hypertension Mother   . Thyroid disease Mother   . Cancer Sister        CERVICAL  . Thyroid disease Sister   . Asthma Sister   . Asthma Brother   . Breast cancer Maternal Grandmother         in her 12s    Social History Social History   Tobacco Use  . Smoking status: Never Smoker  . Smokeless tobacco: Never Used  Vaping Use  . Vaping Use: Never used  Substance Use Topics  . Alcohol use: Not Currently    Comment: socially  . Drug use: No    Allergies  Allergen Reactions  . Shellfish Allergy Anaphylaxis  . Peanut-Containing Drug Products Swelling    SWELLING REACTION UNSPECIFIED   . Sulfa Antibiotics Hives  . Advil [Ibuprofen]     Due to gastric surgery  . Percocet [Oxycodone-Acetaminophen] Other (See Comments)    PT STATES THAT SHE HAS HEADACHES WITH THIS MED    Current Outpatient Medications  Medication Sig Dispense Refill  . Calcium Carb-Cholecalciferol (QC CALCIUM 600 +D3 PO) Take 1 tablet by mouth daily.    . calcium elemental as carbonate (BARIATRIC TUMS ULTRA) 400 MG chewable tablet Chew 1,000-2,000 mg by mouth 3 (three) times daily as needed for heartburn (acid reflux/indigestion.).    Marland Kitchen Cholecalciferol (VITAMIN D) 50 MCG (2000 UT) CAPS Take 2,000 Units by mouth every evening.     . cycloSPORINE (RESTASIS) 0.05 % ophthalmic emulsion Place 1 drop into both eyes 2 (two) times daily as needed (dry/irritated eyes.).     Marland Kitchen fluticasone (FLONASE) 50 MCG/ACT  nasal spray Place 1 spray into both nostrils daily as needed for allergies.     Marland Kitchen gabapentin (NEURONTIN) 300 MG capsule Take 300 mg by mouth in the morning, at noon, and at bedtime.     . methocarbamol (ROBAXIN) 500 MG tablet Take 500 mg by mouth in the morning and at bedtime.     . Olopatadine HCl (PAZEO) 0.7 % SOLN Place 1 drop into both eyes daily.     Marland Kitchen omeprazole (PRILOSEC) 20 MG capsule Take 20 mg by mouth daily.     . Pediatric Multiple Vit-C-FA (MULTIVITAMIN ANIMAL SHAPES, WITH CA/FA,) with C & FA chewable tablet Chew 1 tablet by mouth 2 (two) times daily.    Marland Kitchen tiZANidine (ZANAFLEX) 4 MG tablet Take 1 tablet (4 mg total) by mouth every 6 (six) hours as needed for muscle spasms. 30 tablet 0  .  acetaminophen (TYLENOL) 500 MG tablet Take 1,000 mg by mouth every 6 (six) hours as needed for moderate pain. (Patient not taking: Reported on 08/28/2020)    . albuterol (PROVENTIL HFA;VENTOLIN HFA) 108 (90 Base) MCG/ACT inhaler Inhale 2 puffs into the lungs every 4 (four) hours as needed for wheezing or shortness of breath.  (Patient not taking: Reported on 08/28/2020)    . cephALEXin (KEFLEX) 500 MG capsule Take 1 capsule (500 mg total) by mouth 4 (four) times daily. (Patient not taking: Reported on 08/28/2020) 28 capsule 0  . EPINEPHrine 0.3 mg/0.3 mL IJ SOAJ injection Inject 0.3 mg into the muscle as needed for anaphylaxis. (Patient not taking: Reported on 08/28/2020)    . HYDROcodone-acetaminophen (NORCO/VICODIN) 5-325 MG tablet Take 1-2 tablets by mouth every 6 (six) hours as needed for severe pain. (Patient not taking: Reported on 08/28/2020) 10 tablet 0  . oxyCODONE-acetaminophen (PERCOCET/ROXICET) 5-325 MG tablet Take 1 tablet by mouth every 6 (six) hours as needed. (Patient not taking: Reported on 08/28/2020) 30 tablet 0  . polyethylene glycol (MIRALAX / GLYCOLAX) 17 g packet Take 17 g by mouth daily as needed (constipation.). (Patient not taking: Reported on 08/28/2020)     No current facility-administered medications for this visit.    Review of Systems Review of Systems  Constitutional: Negative.   Gastrointestinal: Positive for abdominal pain (rlq).  Genitourinary: Positive for dyspareunia, pelvic pain (right sided), vaginal bleeding (occasional post coital) and vaginal discharge. Negative for menstrual problem.    Blood pressure 109/69, pulse 74, height 4\' 11"  (1.499 m), weight 131 lb (59.4 kg).  Physical Exam Physical Exam Constitutional:      Appearance: Normal appearance. She is not ill-appearing.  Pulmonary:     Effort: Pulmonary effort is normal.  Abdominal:     General: Abdomen is flat.     Palpations: Abdomen is soft. There is no mass.     Tenderness: There is  abdominal tenderness (RLQ).  Genitourinary:    General: Normal vulva.     Vagina: No vaginal discharge.     Comments: Scar posterior cul de sac to the cervix. No masses, + right side tenderness Musculoskeletal:        General: Normal range of motion.  Skin:    General: Skin is warm and dry.  Neurological:     Mental Status: She is alert.  Psychiatric:        Mood and Affect: Mood normal.        Behavior: Behavior normal.     Data Reviewed Impression Performed by IMPRESSION:  1. Fluid collections in the uterine fundus and cervix.  Cervical collection appears to contain some soft tissue, possibly blood  2. Complicated cysts in an adjacent the right ovary.   Electronically Signed by: Bernadene BellGeoffrey Rieser Narrative Performed by [phone] TECHNIQUE: Transvaginal pelvic ultrasound with incidental color flow Doppler and spectral analysis.   INDICATION:Intermittent pelvic pain and right-sided tenderness.   COMPARISON: No prior studies or reports are made available for comparison or correlation. There is a history of prior CT suggesting uterine mass/fibroid. Patient also apparently underwent endoscopic ablation in July 2013 and tubal ligation in 2009.   FINDINGS:   Uterus: 8 .4 x 4 by 4.5 cm anteverted uterus has a 1 mm endometrial stripe. There is a fluid pocket with avascular soft tissue debris in the cervix. There is also a 9 mm fluid pocket in the endometrial canal at the level of the fundus.   RIGHT ovary: 3.3 x 2.2 x 3.1 cm. A septated cyst is present measuring 3.1 x 2.2 x 3.3 cm. The cyst itself is avascular but blood flow is present within the adjacent ovarian tissues. Adjacent the ovary is a 13 x 14 mm hypoechoic structure with internal echoes but no vascularity on Doppler evaluation, likely a hemorrhagic cyst.   LEFT ovary:3.2 x 1.3 x 2.5 cm. Arterial and venous blood flow are confirmed with normal spectral waveforms. No adjacent masses or fluid collections.   Additional  findings: Trace amount of fluid in the cul-de-sac Procedure Note  Danella Sensingieser, Geoffrey D, MD - 07/25/2020  Formatting of this note might be different from the original.  TECHNIQUE: Transvaginal pelvic ultrasound with incidental color flow Doppler and spectral analysis.   INDICATION:Intermittent pelvic pain and right-sidedtenderness.   COMPARISON: No prior studies or reports are made available for comparison or correlation. There is a history of prior CT suggesting uterine mass/fibroid. Patient also apparently underwent endoscopic ablation in July 2013 and tubal ligation in 2009.   FINDINGS:   Uterus: 8 .4 x 4 by 4.5 cm anteverted uterus has a 1 mm endometrial stripe. There is a fluid pocket with avascular soft tissue debris in the cervix. There is also a 9 mm fluid pocket in the endometrial canal at the level of thefundus.   RIGHT ovary: 3.3 x 2.2 x 3.1 cm. A septated cyst is present measuring 3.1 x 2.2 x 3.3 cm. The cyst itself is avascular but blood flow is present within the adjacent ovarian tissues. Adjacent the ovary is a 13 x 14 mm hypoechoic structure withinternal echoes but no vascularity on Doppler evaluation, likely a hemorrhagic cyst.   LEFT ovary: 3.2 x 1.3 x 2.5 cm. Arterial and venous blood flow are confirmed with normal spectral waveforms. No adjacent masses or fluid collections.   Additional findings: Trace amount of fluid in the cul-de-sac    IMPRESSION:  1. Fluid collections in the uterine fundus and cervix. Cervical collection appears to contain some soft tissue, possibly blood  2. Complicated cysts in an adjacent the right ovary.   Electronically Signed by: Bernadene BellGeoffrey Rieser Specimen Collected: 07/25/20 1:21 PM Last Resulted: 07/25/20 1:34 PM  Received From: [facility]  Result Received: 08/11/20 3:36 PM     Assessment Pap smear for cervical cancer screening - Plan: Cytology - PAP( [place])  Cyst of right ovary - Plan: US PELVIC COMPLETE WITH  TRANSVAGINAL, [facility]  Pelvic pain in female - Plan: US PELVIC COMPLETE WITH TRANSVAGINAL  US result reviewed. Needs f/u to see if cyst resolves.   Plan [facility] to establish menopausal status F/U 6 weeks Testing for pelvic  infection included with pap     Scheryl Darter 08/28/2020, 10:20 AM

## 2020-08-28 NOTE — Progress Notes (Signed)
NGYN pt referred for R ovarian cyst  Pt is experiencing R pelvic pain Normal pap 2 yrs ago per pt  Normal mammogram 07/22/2020 Colonoscopy due in 7 yrs per pt

## 2020-08-29 ENCOUNTER — Telehealth: Payer: Self-pay | Admitting: Obstetrics & Gynecology

## 2020-08-29 DIAGNOSIS — R102 Pelvic and perineal pain: Secondary | ICD-10-CM

## 2020-08-29 LAB — FOLLICLE STIMULATING HORMONE: FSH: 19 m[IU]/mL

## 2020-09-04 LAB — CYTOLOGY - PAP
Chlamydia: NEGATIVE
Comment: NEGATIVE
Comment: NEGATIVE
Comment: NEGATIVE
Comment: NORMAL
Diagnosis: NEGATIVE
Diagnosis: REACTIVE
High risk HPV: NEGATIVE
Neisseria Gonorrhea: NEGATIVE
Trichomonas: NEGATIVE

## 2020-09-15 ENCOUNTER — Emergency Department (HOSPITAL_COMMUNITY): Payer: Medicaid Other

## 2020-09-15 ENCOUNTER — Other Ambulatory Visit: Payer: Self-pay

## 2020-09-15 ENCOUNTER — Encounter (HOSPITAL_COMMUNITY): Payer: Self-pay

## 2020-09-15 ENCOUNTER — Emergency Department (HOSPITAL_COMMUNITY)
Admission: EM | Admit: 2020-09-15 | Discharge: 2020-09-15 | Disposition: A | Discharge status: Home / Self Care | Payer: Medicaid Other | Attending: Emergency Medicine | Admitting: Emergency Medicine

## 2020-09-15 DIAGNOSIS — J45909 Unspecified asthma, uncomplicated: Secondary | ICD-10-CM | POA: Insufficient documentation

## 2020-09-15 DIAGNOSIS — N83201 Unspecified ovarian cyst, right side: Secondary | ICD-10-CM

## 2020-09-15 DIAGNOSIS — R1031 Right lower quadrant pain: Secondary | ICD-10-CM | POA: Diagnosis present

## 2020-09-15 DIAGNOSIS — K219 Gastro-esophageal reflux disease without esophagitis: Secondary | ICD-10-CM | POA: Diagnosis not present

## 2020-09-15 DIAGNOSIS — N83291 Other ovarian cyst, right side: Secondary | ICD-10-CM | POA: Diagnosis not present

## 2020-09-15 DIAGNOSIS — Z91010 Allergy to peanuts: Secondary | ICD-10-CM | POA: Insufficient documentation

## 2020-09-15 HISTORY — DX: Unspecified ovarian cyst, unspecified side: N83.209

## 2020-09-15 LAB — URINALYSIS, ROUTINE W REFLEX MICROSCOPIC
Bilirubin Urine: NEGATIVE
Glucose, UA: NEGATIVE mg/dL
Hgb urine dipstick: NEGATIVE
Ketones, ur: NEGATIVE mg/dL
Nitrite: NEGATIVE
Protein, ur: NEGATIVE mg/dL
Specific Gravity, Urine: 1.008 (ref 1.005–1.030)
pH: 6 (ref 5.0–8.0)

## 2020-09-15 LAB — CBC
HCT: 40.1 % (ref 36.0–46.0)
Hemoglobin: 13.4 g/dL (ref 12.0–15.0)
MCH: 32.6 pg (ref 26.0–34.0)
MCHC: 33.4 g/dL (ref 30.0–36.0)
MCV: 97.6 fL (ref 80.0–100.0)
Platelets: 224 10*3/uL (ref 150–400)
RBC: 4.11 MIL/uL (ref 3.87–5.11)
RDW: 12.8 % (ref 11.5–15.5)
WBC: 7.8 10*3/uL (ref 4.0–10.5)
nRBC: 0 % (ref 0.0–0.2)

## 2020-09-15 LAB — COMPREHENSIVE METABOLIC PANEL
ALT: 46 U/L — ABNORMAL HIGH (ref 0–44)
AST: 29 U/L (ref 15–41)
Albumin: 4.6 g/dL (ref 3.5–5.0)
Alkaline Phosphatase: 75 U/L (ref 38–126)
Anion gap: 10 (ref 5–15)
BUN: 16 mg/dL (ref 6–20)
CO2: 27 mmol/L (ref 22–32)
Calcium: 9.3 mg/dL (ref 8.9–10.3)
Chloride: 105 mmol/L (ref 98–111)
Creatinine, Ser: 0.49 mg/dL (ref 0.44–1.00)
GFR, Estimated: >60 mL/min (ref >60)
Glucose, Bld: 105 mg/dL — ABNORMAL HIGH (ref 70–99)
Potassium: 4 mmol/L (ref 3.5–5.1)
Sodium: 142 mmol/L (ref 135–145)
Total Bilirubin: 0.5 mg/dL (ref 0.3–1.2)
Total Protein: 7.7 g/dL (ref 6.5–8.1)

## 2020-09-15 LAB — I-STAT BETA HCG BLOOD, ED (MC, WL, AP ONLY): I-stat hCG, quantitative: <5 m[IU]/mL (ref <5)

## 2020-09-15 LAB — LIPASE, BLOOD: Lipase: 35 U/L (ref 11–51)

## 2020-09-15 IMAGING — US US PELVIS COMPLETE TRANSABD/TRANSVAG W DUPLEX
1 series · 13 of 25 positions shown · non-contrast
Comparison: [DATE]

CLINICAL DATA: Right lower quadrant pain

EXAM:
TRANSABDOMINAL AND TRANSVAGINAL ULTRASOUND OF PELVIS
DOPPLER ULTRASOUND OF OVARIES
TECHNIQUE: Both transabdominal and transvaginal ultrasound examinations of the
pelvis were performed. Transabdominal technique was performed for
global imaging of the pelvis including uterus, ovaries, adnexal
regions, and pelvic cul-de-sac.
It was necessary to proceed with endovaginal exam following the
transabdominal exam to visualize the ovaries. Color and duplex
Doppler ultrasound was utilized to evaluate blood flow to the
ovaries.

[Series 1: us pelvis complete transabd/transvag w duplex · 100 acquisitions, 13 frames shown]
[im 1/100]
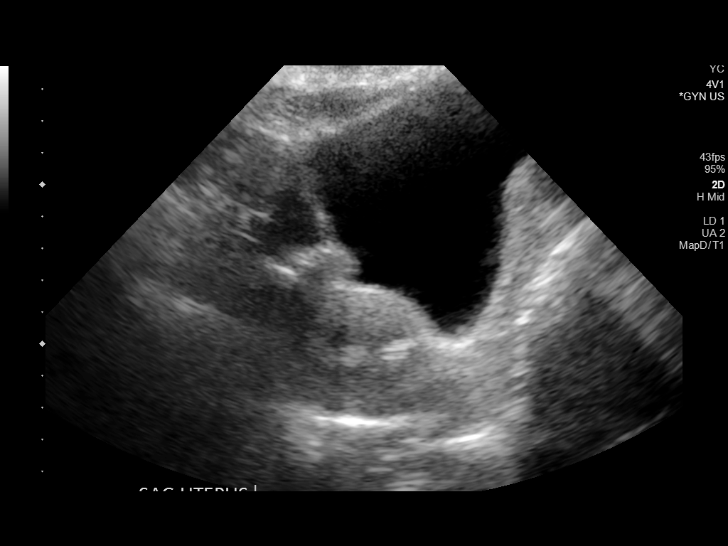
[im 9/100]
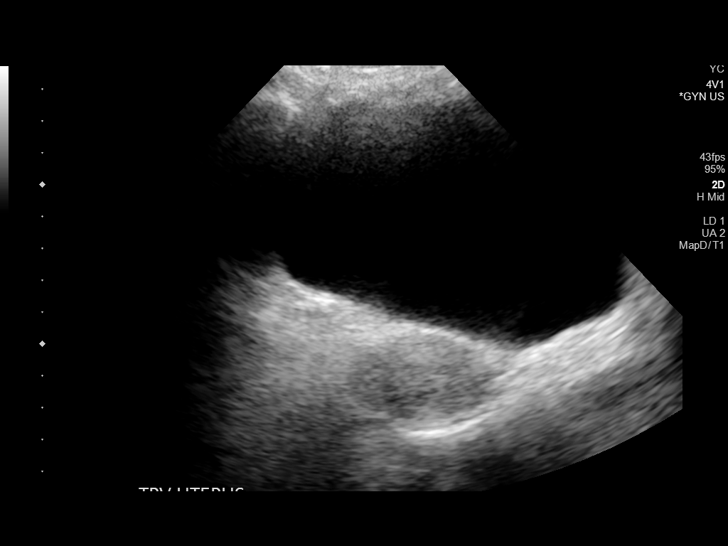
[im 17/100]
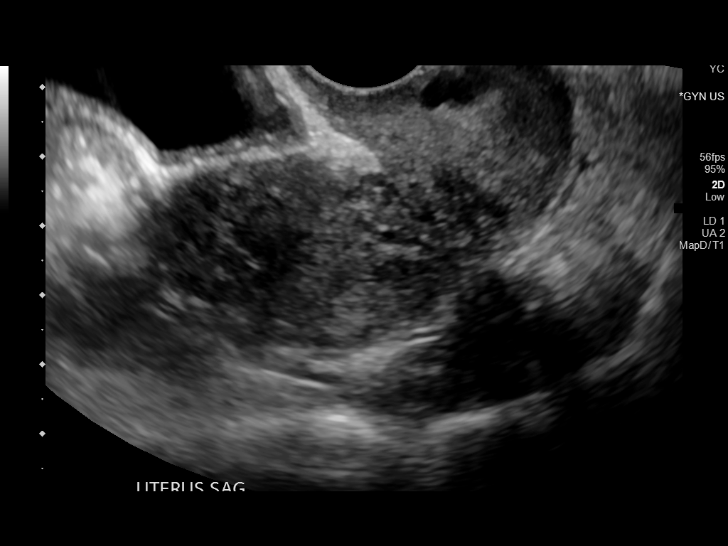
[im 25/100]
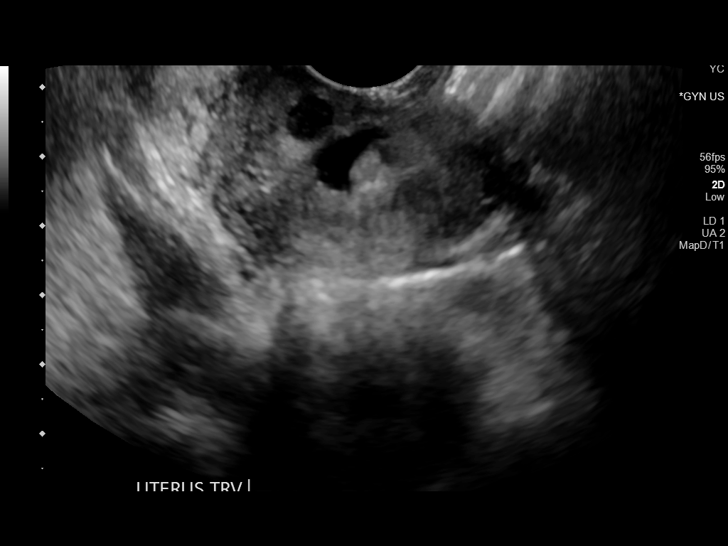
[im 34/100]
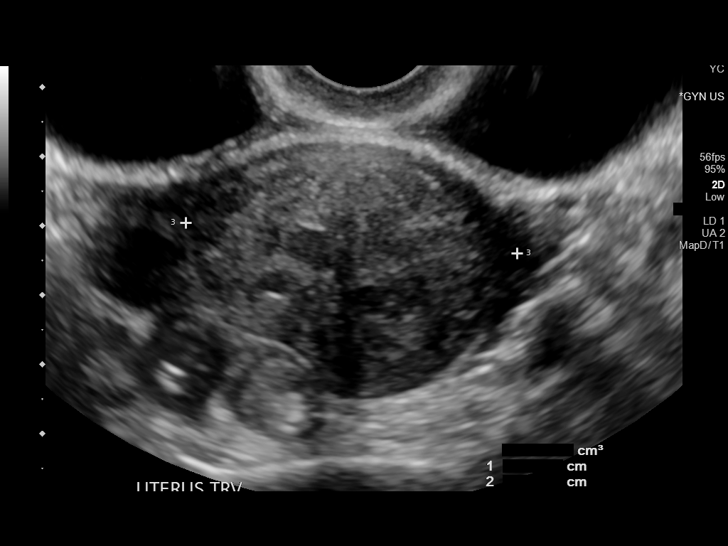
[im 42/100]
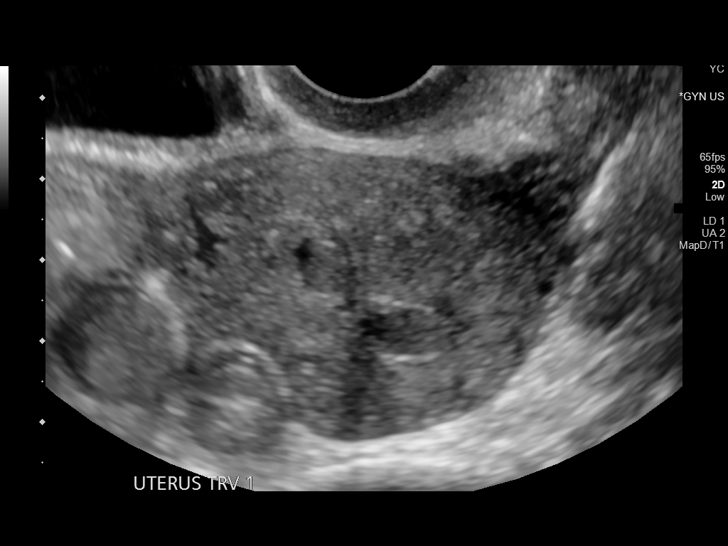
[im 50/100]
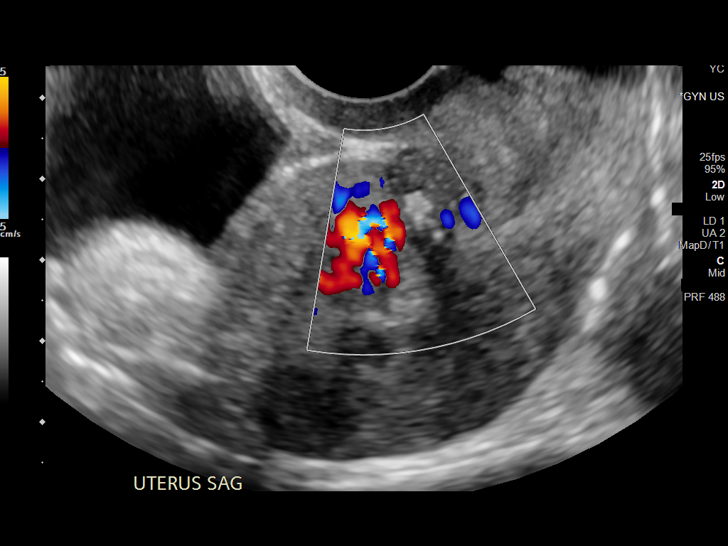
[im 58/100]
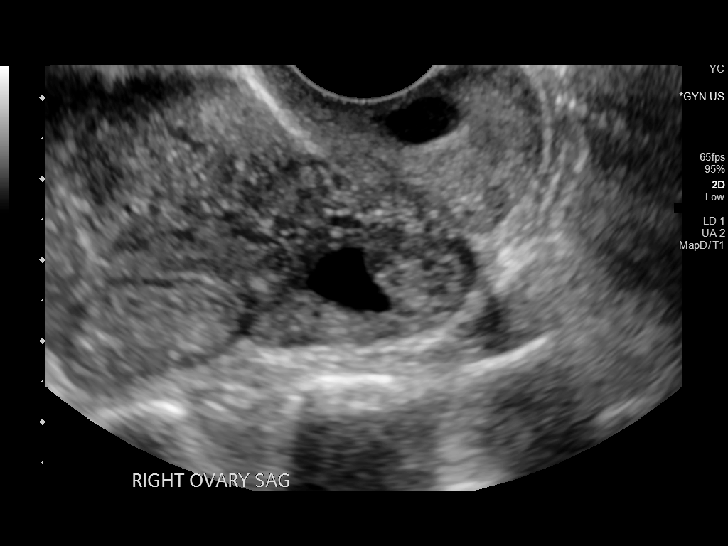
[im 67/100]
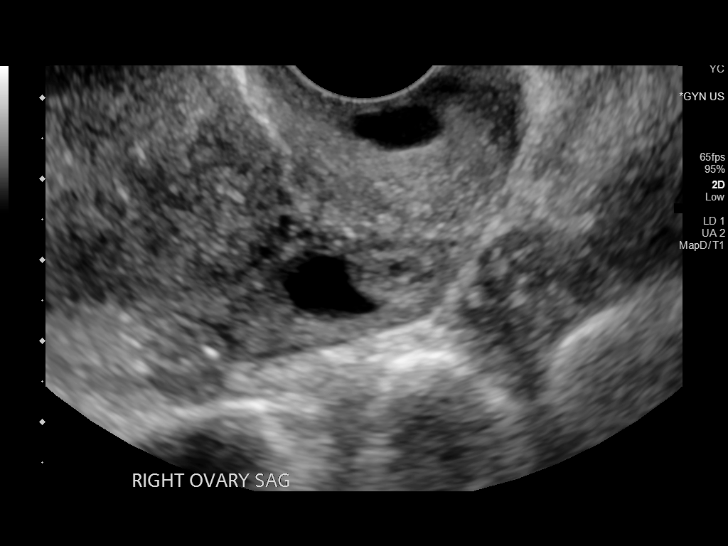
[im 75/100]
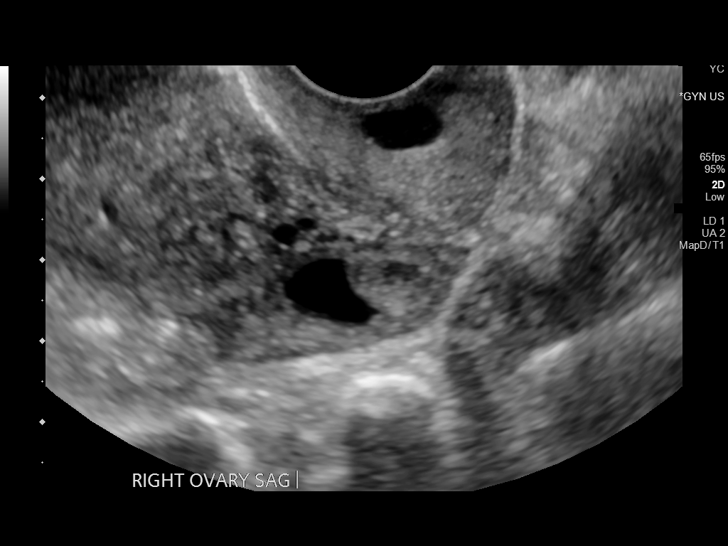
[im 83/100]
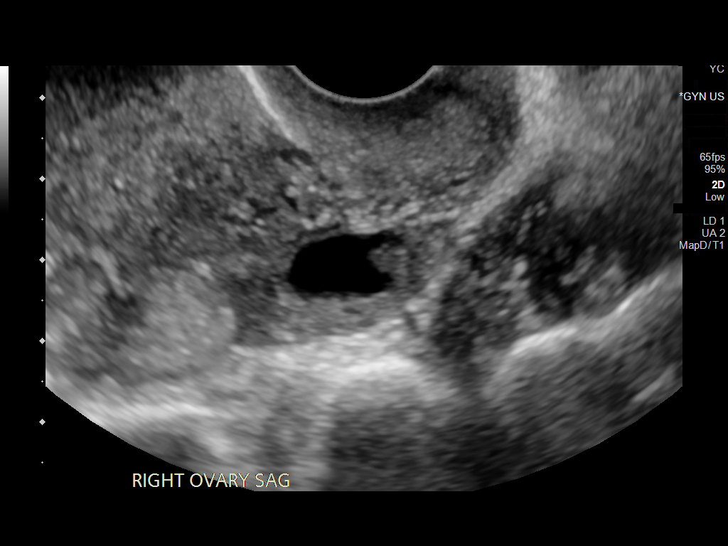
[im 91/100]
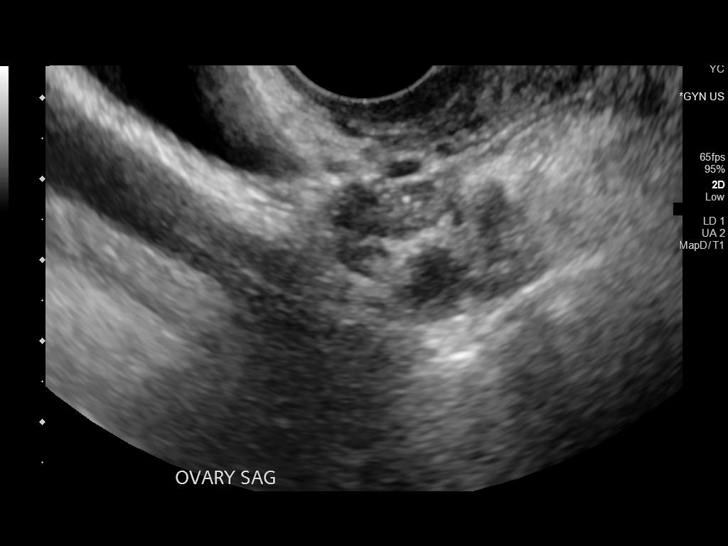
[im 100/100]
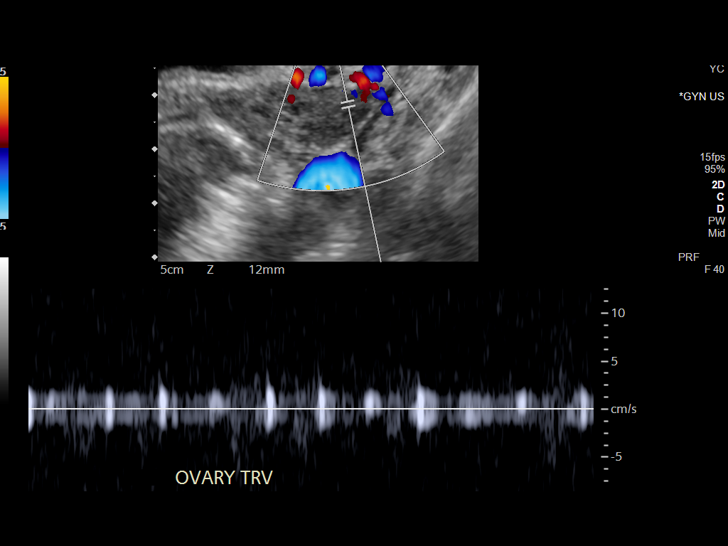

[13 of 25 positions shown; findings below may reference images not displayed]

FINDINGS: Uterus

Measurements: 7.2 x 4.0 x 4.8 cm. = volume: 72 mL. Multiple mildly
hypoechoic lesions are noted scattered throughout the uterus. The
largest of these measures 1.4 cm in the fundus. This would
correspond to areas seen on recent CT examination. Likely related to
small fibroids.

Endometrium

Thickness: 4.2 mm.  No focal abnormality visualized.

Right ovary

Measurements: 3.1 x 1.3 x 2.4 cm. = volume: 5 mL. 1.9 x 0.9 x 1.3 cm
somewhat complex cystic area is noted within the right ovary. This
corresponds to a cystic areas seen on recent CT examination and
corresponds with the patient's given clinical history. This may
represent a simple cyst and adjacent more complex cyst given the
appearance.

Left ovary

Measurements: 1.9 x 0.8 x 1.8 cm. = volume: 1.4 mL. Normal
appearance/no adnexal mass.

Pulsed Doppler evaluation of both ovaries demonstrates normal
low-resistance arterial and venous waveforms.

Other findings

No abnormal free fluid.
IMPRESSION: Multiple mildly hypoechoic lesions are noted throughout the uterus
similar to that seen on prior CT examination likely related to
uterine fibroids.

Changes in the right ovary which may represent a complex cyst or a
simple cyst adjacent to a more echogenic lesion. No internal blood
flow is identified. This may represent a cystic ovarian neoplasm
although no malignant features are seen. Follow-up in 3 months is
recommended for further evaluation.

## 2020-09-15 IMAGING — CT CT ABD-PELV W/ CM
2 of 5 series · 16 of 46 positions shown, 18 images · IV contrast (omnipaque)
Comparison: CT abdomen pelvis dated [DATE].

CLINICAL DATA: 49-year-old female with right lower quadrant
abdominal pain.

EXAM:
CT ABDOMEN AND PELVIS WITH CONTRAST
TECHNIQUE: Multidetector CT imaging of the abdomen and pelvis was performed
using the standard protocol following bolus administration of
intravenous contrast.
CONTRAST:  100mL OMNIPAQUE IOHEXOL 300 MG/ML  SOLN

[Series 2: axial st · axial · 0.74mm/px · z∈[+649,+994]mm · 13 of 81 slices shown, 15 images]
[im 6/81  soft-tissue]
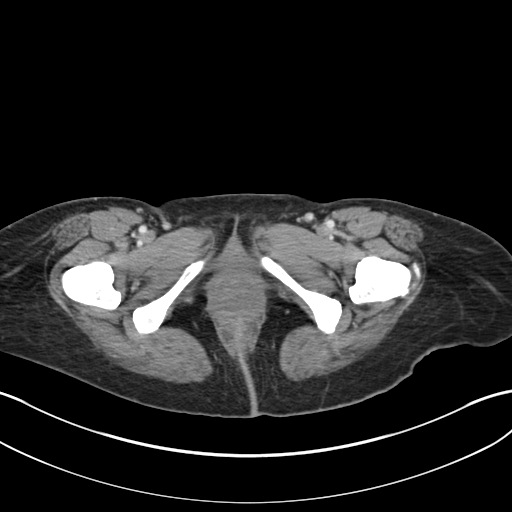
[im 6/81  bone]
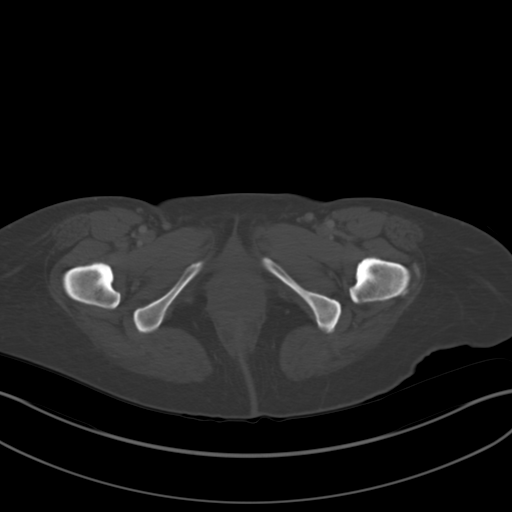
[im 12/81  soft-tissue]
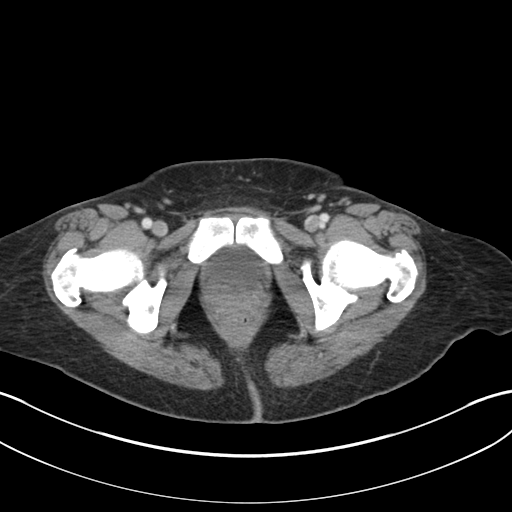
[im 18/81  soft-tissue]
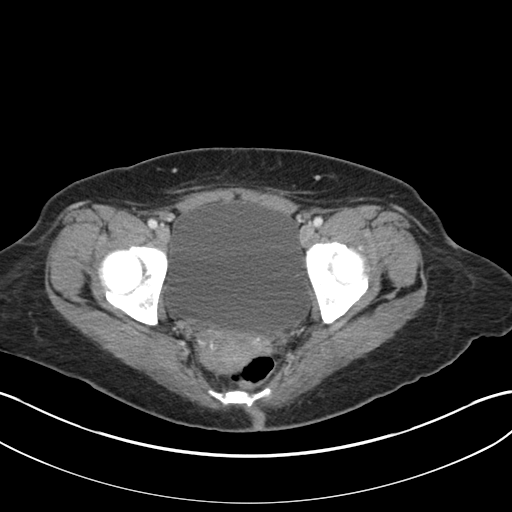
[im 23/81  soft-tissue]
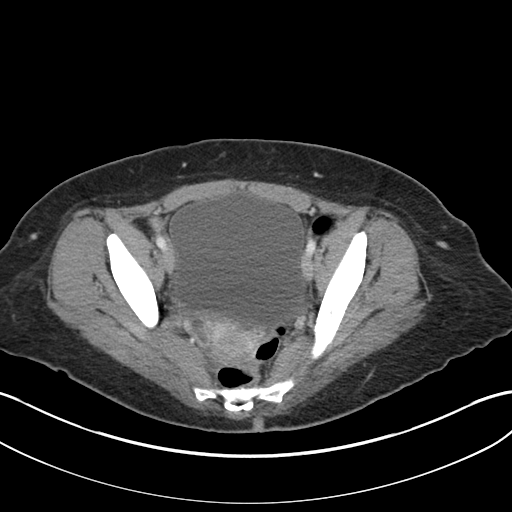
[im 29/81  soft-tissue]
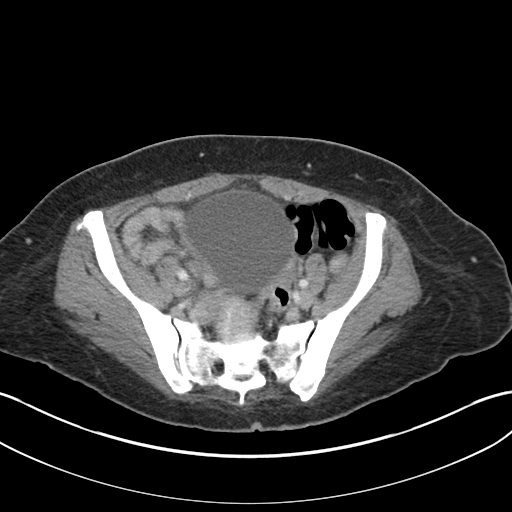
[im 35/81  soft-tissue]
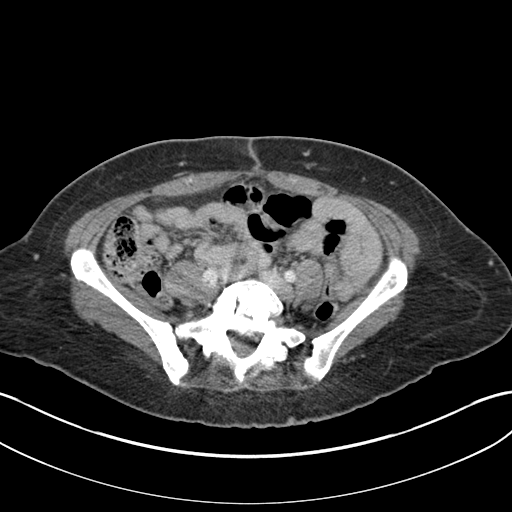
[im 41/81  soft-tissue]
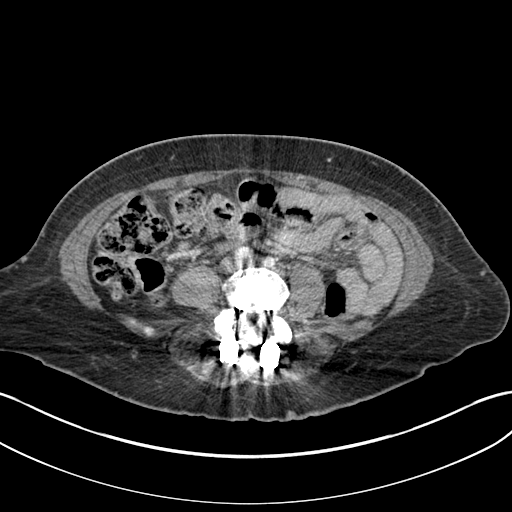
[im 46/81  soft-tissue]
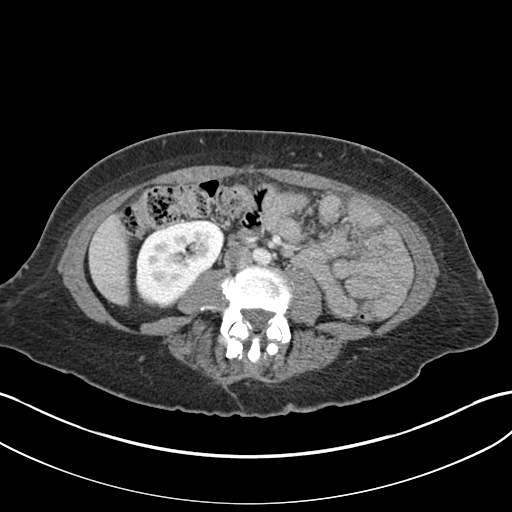
[im 52/81  soft-tissue]
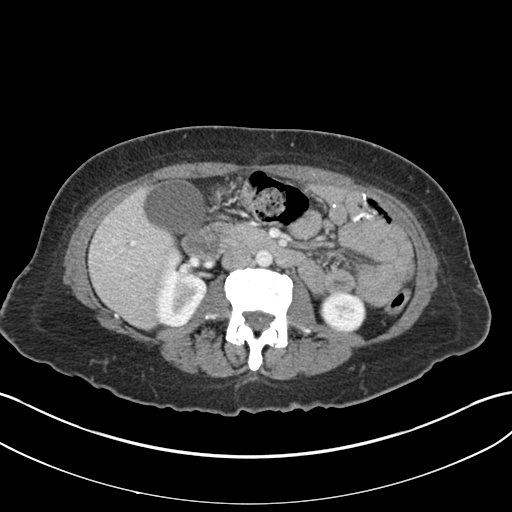
[im 52/81  bone]
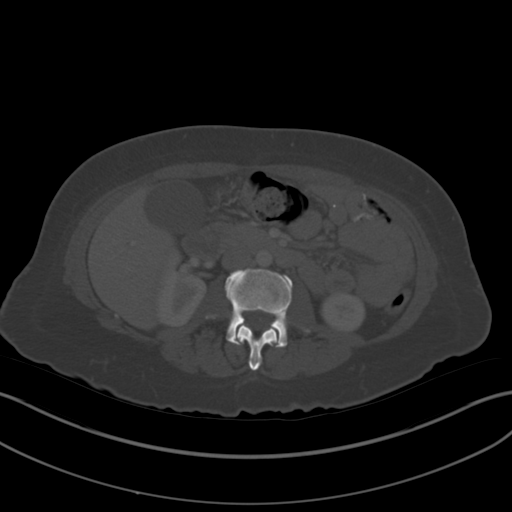
[im 58/81  soft-tissue]
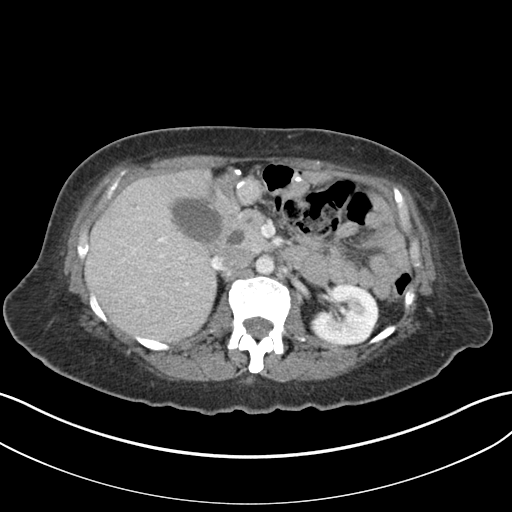
[im 63/81  soft-tissue]
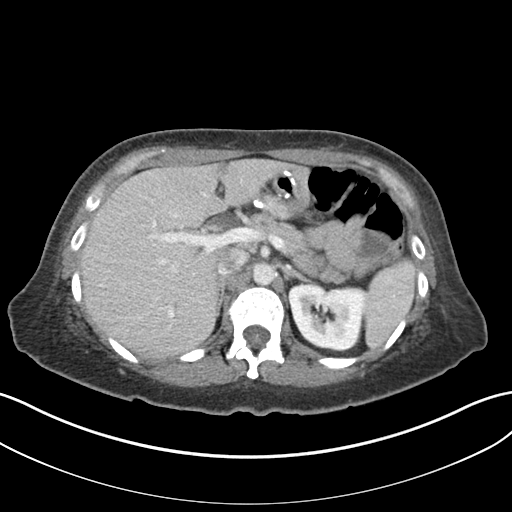
[im 69/81  soft-tissue]
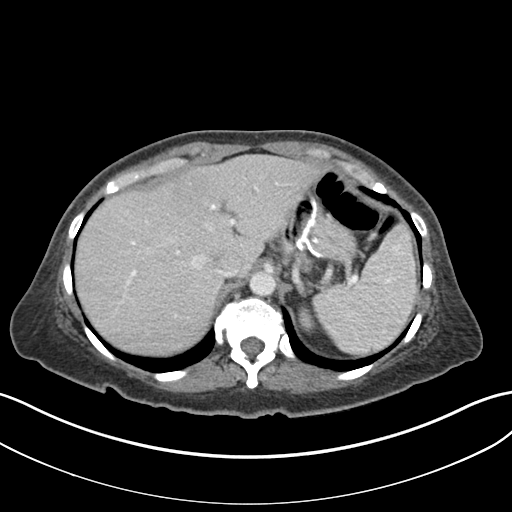
[im 75/81  soft-tissue]
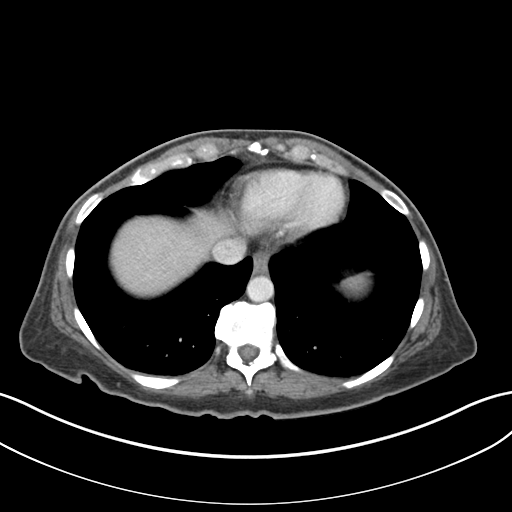

[Series 5: coronal st · coronal · 0.70mm/px · 3 of 109 slices shown]
[im 37/109  soft-tissue]
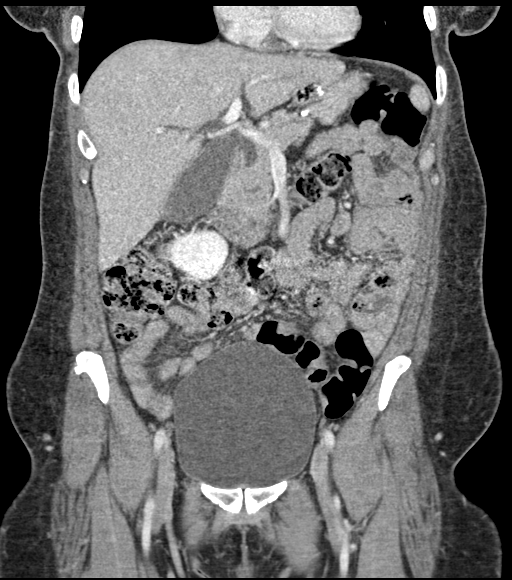
[im 49/109  soft-tissue]
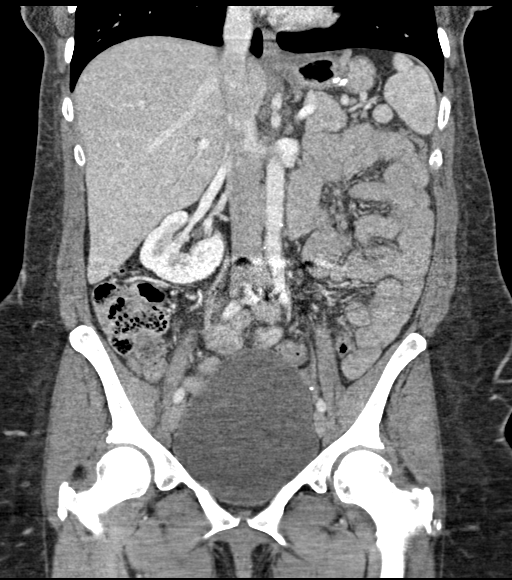
[im 61/109  soft-tissue]
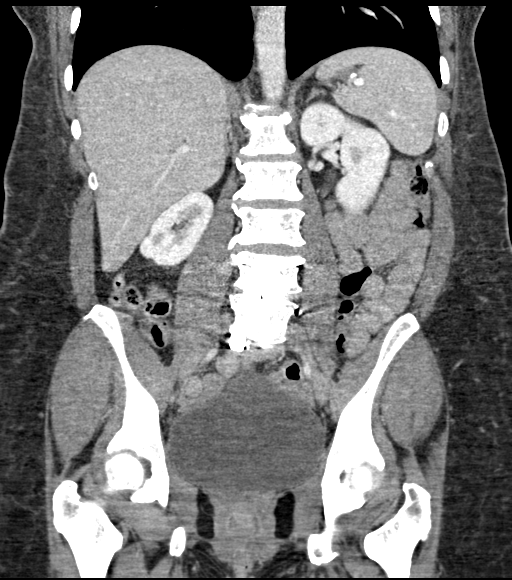

[16 of 46 positions shown; findings below may reference images not displayed]

FINDINGS: Lower chest: The visualized lung bases are clear.

No intra-abdominal free air or free fluid.

Hepatobiliary: The liver is unremarkable. There is mild intrahepatic
biliary dilatation. There is distension of the gallbladder. No
calcified gallstone or pericholecystic fluid. The common bile duct
is mildly dilated measuring 10 mm in diameter.

Pancreas: Unremarkable. No pancreatic ductal dilatation or
surrounding inflammatory changes.

Spleen: Normal in size without focal abnormality.

Adrenals/Urinary Tract: The adrenal glands unremarkable. There is no
hydronephrosis on either side. There is symmetric enhancement and
excretion of contrast by both kidneys. The visualized ureters and
urinary bladder appear unremarkable.

Stomach/Bowel: Postsurgical changes of bowel with multiple
anastomotic sutures. There is no bowel obstruction or active
inflammation. The appendix is normal.

Vascular/Lymphatic: The abdominal aorta and IVC unremarkable. No
portal venous gas. There is no adenopathy.

Reproductive: Several small hypodense uterine lesions, possibly
fibroids. The ovaries are suboptimally evaluated. A 2 cm ovoid
structure in the right hemipelvis ([DATE] represent an ovarian
follicle or cyst. The pelvic structures are better evaluated on the
ultrasound of [DATE]. Follow-up right ovarian structure in 3
months as per recommendation of the ultrasound.

Other: None

Musculoskeletal: Degenerative changes of the spine. L4-S1 disc
spacer and posterior fusion. No acute osseous pathology.
IMPRESSION: 1. No acute intra-abdominal or pelvic pathology. No bowel
obstruction. Normal appendix.
2. Mild intrahepatic and extrahepatic biliary dilatation. No
calcified gallstone or pericholecystic fluid.

## 2020-09-15 MED ORDER — HYDROCODONE-ACETAMINOPHEN 5-325 MG PO TABS: 1.0000 | ORAL_TABLET | Freq: Four times a day (QID) | ORAL | 0 refills | Status: DC | PRN

## 2020-09-15 MED ORDER — HYDROMORPHONE HCL 1 MG/ML IJ SOLN
1.0000 mg | Freq: Once | INTRAMUSCULAR | Status: AC
  Administered 2020-09-15: 1 mg via INTRAVENOUS
  Filled 2020-09-15: qty 1

## 2020-09-15 MED ORDER — SODIUM CHLORIDE (PF) 0.9 % IJ SOLN
INTRAMUSCULAR | Status: AC
  Filled 2020-09-15: qty 50

## 2020-09-15 MED ORDER — SODIUM CHLORIDE 0.9 % IV BOLUS
500.0000 mL | Freq: Once | INTRAVENOUS | Status: AC
  Administered 2020-09-15: 500 mL via INTRAVENOUS

## 2020-09-15 MED ORDER — IOHEXOL 300 MG/ML  SOLN
100.0000 mL | Freq: Once | INTRAMUSCULAR | Status: AC | PRN
  Administered 2020-09-15: 100 mL via INTRAVENOUS

## 2020-09-15 MED ORDER — ONDANSETRON HCL 4 MG/2ML IJ SOLN
4.0000 mg | Freq: Once | INTRAMUSCULAR | Status: AC
  Administered 2020-09-15: 4 mg via INTRAVENOUS
  Filled 2020-09-15: qty 2

## 2020-09-15 MED ORDER — MORPHINE SULFATE (PF) 4 MG/ML IV SOLN
4.0000 mg | Freq: Once | INTRAVENOUS | Status: AC
  Administered 2020-09-15: 4 mg via INTRAVENOUS
  Filled 2020-09-15: qty 1

## 2020-09-15 NOTE — ED Notes (Signed)
Attempt to [facility] for IV

## 2020-09-15 NOTE — Discharge Instructions (Addendum)
Your ultrasound continues to show a right-sided ovarian cyst we don't see any evidence of torsion, it also shows that you have multiple uterine fibroids that could also be contributing to your pain.  You can use prescribed hydrocodone as needed for severe pain, you can also take 500-650 mg of Tylenol in addition to this pain medication, do not take more than this has pain medication already contains Tylenol and taking too much can hurt your liver.  Please call to see if OB/GYN has any sooner follow-up appointments available.  Return for fever, vomiting or new or worsening abdominal pain.

## 2020-09-15 NOTE — ED Triage Notes (Signed)
patient c/o RLQ abdominal pain. Patient states this is her 3rd visit for the same pain. Patient states she began having emesis yesterday. Patient reports that she has a right ovarian cyst.

## 2020-09-15 NOTE — ED Provider Notes (Signed)
[facility]-EMERGENCY DEPT Provider Note   CSN: [id number] Arrival date & time: 09/15/20  1442     History Chief Complaint  Patient presents with  . Abdominal Pain    NELEH MULDOON is a 49 y.o. female.  MILAH RECHT is a 49 y.o. female with hx of right ovarian cyst, hyperlipidemia, GERD, thyroid disease, migraines and arthritis, who presents for evaluation of right lower quadrant abdominal pain. Patient reports she has been dealing with this pain for a few months, has been seen in the ED 3 times prior, and has upcoming follow up with OB/GYN for known ovarian cyst. She states that over the past two days pain has gotten significantly worse and she has had some intermittent vomiting. Normal bowel movements. No fevers or chills. Patient denies urinary symptoms,vaginal bleeding or discharge. Has been using over the counter medications without improvement, unable to take NSAIDs due to prior gastric surgery.        Past Medical History:  Diagnosis Date  . Anxiety   . Arthritis   . Asthma   . GERD (gastroesophageal reflux disease)   . Headache   . Hepatomegaly   . Hx of migraines   . Hyperlipidemia   . Ovarian cyst   . Thyroid disease     Patient Active Problem List   Diagnosis Date Noted  . Ovarian cyst 08/28/2020  . S/P cervical spinal fusion 02/18/2020  . S/P lumbar fusion 09/05/2019  . S/P endometrial ablation - 04/2012 05/29/2012    Past Surgical History:  Procedure Laterality Date  . ANTERIOR CERVICAL DECOMP/DISCECTOMY FUSION N/A 02/18/2020   Procedure: Anterior Cervical Decompression/Discectomy Fusion - Cervical five-Cervcal six;  Surgeon: Tia Alert, MD;  Location: [facility] OR;  Service: Neurosurgery;  Laterality: N/A;  . BTL  07/30/2008  . CARPAL TUNNEL RELEASE Right   . CARPAL TUNNEL RELEASE Left 09/22/2016   Procedure: CARPAL TUNNEL RELEASE, left;  Surgeon: Dairl Ponder, MD;  Location: [facility];  Service: Orthopedics;   Laterality: Left;  . CESAREAN SECTION    . DORSAL COMPARTMENT RELEASE Left 09/22/2016   Procedure: RELEASE DORSAL COMPARTMENT (DEQUERVAIN);  Surgeon: Dairl Ponder, MD;  Location: [facility];  Service: Orthopedics;  Laterality: Left;  . HYSTEROSCOPY W/ ENDOMETRIAL ABLATION    . TUBAL LIGATION    . WISDOM TOOTH EXTRACTION       OB History    Gravida  1   Para  1   Term      Preterm      AB      Living  1     SAB      TAB      Ectopic      Multiple      Live Births              Family History  Problem Relation Age of Onset  . Diabetes Father   . Hypertension Father   . Hyperlipidemia Father   . Thyroid disease Father   . Asthma Father   . Cancer Mother   . Hypertension Mother   . Thyroid disease Mother   . Cancer Sister        CERVICAL  . Thyroid disease Sister   . Asthma Sister   . Asthma Brother   . Breast cancer Maternal Grandmother        in her 50s    Social History   Tobacco Use  . Smoking status: Never Smoker  .  Smokeless tobacco: Never Used  Vaping Use  . Vaping Use: Never used  Substance Use Topics  . Alcohol use: Not Currently  . Drug use: No    Home Medications Prior to Admission medications   Medication Sig Start Date End Date Taking? Authorizing Provider  acetaminophen (TYLENOL) 500 MG tablet Take 1,000 mg by mouth every 6 (six) hours as needed for moderate pain. Patient not taking: Reported on 08/28/2020    [provider]  albuterol (PROVENTIL HFA;VENTOLIN HFA) 108 (90 Base) MCG/ACT inhaler Inhale 2 puffs into the lungs every 4 (four) hours as needed for wheezing or shortness of breath.  Patient not taking: Reported on 08/28/2020    [provider]  Calcium Carb-Cholecalciferol (QC CALCIUM 600 +D3 PO) Take 1 tablet by mouth daily.    [provider]  calcium elemental as carbonate (BARIATRIC TUMS ULTRA) 400 MG chewable tablet Chew 1,000-2,000 mg by mouth 3 (three) times daily as  needed for heartburn (acid reflux/indigestion.).    [provider]  cephALEXin (KEFLEX) 500 MG capsule Take 1 capsule (500 mg total) by mouth 4 (four) times daily. Patient not taking: Reported on 08/28/2020 08/11/20   Terrilee Files, MD  Cholecalciferol (VITAMIN D) 50 MCG (2000 UT) CAPS Take 2,000 Units by mouth every evening.     [provider]  cycloSPORINE (RESTASIS) 0.05 % ophthalmic emulsion Place 1 drop into both eyes 2 (two) times daily as needed (dry/irritated eyes.).     [provider]  EPINEPHrine 0.3 mg/0.3 mL IJ SOAJ injection Inject 0.3 mg into the muscle as needed for anaphylaxis. Patient not taking: Reported on 08/28/2020 11/21/19   [provider]  fluticasone (FLONASE) 50 MCG/ACT nasal spray Place 1 spray into both nostrils daily as needed for allergies.     [provider]  gabapentin (NEURONTIN) 300 MG capsule Take 300 mg by mouth in the morning, at noon, and at bedtime.     [provider]  HYDROcodone-acetaminophen (NORCO) 5-325 MG tablet Take 1 tablet by mouth every 6 (six) hours as needed. 09/15/20   Dartha Lodge, PA-C  methocarbamol (ROBAXIN) 500 MG tablet Take 500 mg by mouth in the morning and at bedtime.     [provider]  Olopatadine HCl (PAZEO) 0.7 % SOLN Place 1 drop into both eyes daily.     [provider]  omeprazole (PRILOSEC) 20 MG capsule Take 20 mg by mouth daily.     [provider]  oxyCODONE-acetaminophen (PERCOCET/ROXICET) 5-325 MG tablet Take 1 tablet by mouth every 6 (six) hours as needed. Patient not taking: Reported on 08/28/2020 02/19/20   Tia Alert, MD  Pediatric Multiple Vit-C-FA (MULTIVITAMIN ANIMAL SHAPES, WITH CA/FA,) with C & FA chewable tablet Chew 1 tablet by mouth 2 (two) times daily.    [provider]  polyethylene glycol (MIRALAX / GLYCOLAX) 17 g packet Take 17 g by mouth daily as needed (constipation.). Patient not taking: Reported on  08/28/2020    [provider]  tiZANidine (ZANAFLEX) 4 MG tablet Take 1 tablet (4 mg total) by mouth every 6 (six) hours as needed for muscle spasms. 07/06/20   Bing Neighbors, FNP    Allergies    Shellfish allergy, Peanut-containing drug products, Sulfa antibiotics, Advil [ibuprofen], and Percocet [oxycodone-acetaminophen]  Review of Systems   Review of Systems  Constitutional: Negative for chills and fever.  HENT: Negative.   Eyes: Negative for visual disturbance.  Respiratory: Negative for cough and shortness of  breath.   Cardiovascular: Negative for chest pain.  Gastrointestinal: Positive for abdominal pain, nausea and vomiting. Negative for diarrhea.  Genitourinary: Negative for dysuria, flank pain, frequency, hematuria, vaginal bleeding and vaginal discharge.  Musculoskeletal: Negative for arthralgias and myalgias.  Skin: Negative for color change and rash.    Physical Exam Updated Vital Signs BP 121/73 (BP Location: Right Arm)   Pulse 69   Temp 98.2 F (36.8 C) (Oral)   Resp 18   Ht 4\' 11"  (1.499 m)   Wt 58.7 kg   SpO2 97%   BMI 26.16 kg/m   Physical Exam Vitals and nursing note reviewed.  Constitutional:      General: She is not in acute distress.    Appearance: She is well-developed. She is not diaphoretic.     Comments: Alert, appears uncomfortable, but is in no acute distress  HENT:     Head: Normocephalic and atraumatic.     Mouth/Throat:     Mouth: Mucous membranes are moist.     Pharynx: Oropharynx is clear.  Eyes:     General:        Right eye: No discharge.        Left eye: No discharge.     Pupils: Pupils are equal, round, and reactive to light.  Cardiovascular:     Rate and Rhythm: Normal rate and regular rhythm.     Heart sounds: Normal heart sounds.  Pulmonary:     Effort: Pulmonary effort is normal. No respiratory distress.     Breath sounds: Normal breath sounds. No wheezing or rales.     Comments: Respirations equal and  unlabored, patient able to speak in full sentences, lungs clear to auscultation bilaterally  Abdominal:     General: Bowel sounds are normal. There is no distension.     Palpations: Abdomen is soft. There is no mass.     Tenderness: There is abdominal tenderness in the right lower quadrant. There is guarding.     Comments: Abdomen is soft, non-distended, bowels sounds present throughout, there is focal tenderness in the right lower quadrant, all other quadrants NTTP, there is guarding in the right lower quadrant.  Musculoskeletal:        General: No deformity.     Cervical back: Neck supple.  Skin:    General: Skin is warm and dry.     Capillary Refill: Capillary refill takes less than 2 seconds.  Neurological:     Mental Status: She is alert.     Coordination: Coordination normal.     Comments: Speech is clear, able to follow commands Moves extremities without ataxia, coordination intact  Psychiatric:        Mood and Affect: Mood normal.        Behavior: Behavior normal.     ED Results / Procedures / Treatments   Labs (all labs ordered are listed, but only abnormal results are displayed) Labs Reviewed  COMPREHENSIVE METABOLIC PANEL - Abnormal; Notable for the following components:      Result Value   Glucose, Bld 105 (*)    ALT 46 (*)    All other components within normal limits  URINALYSIS, ROUTINE W REFLEX MICROSCOPIC - Abnormal; Notable for the following components:   Color, Urine STRAW (*)    Leukocytes,Ua MODERATE (*)    Bacteria, UA RARE (*)    All other components within normal limits  LIPASE, BLOOD  CBC  I-STAT BETA HCG BLOOD, ED ([facility], [facility], [facility] ONLY)  EKG None  Radiology CT ABDOMEN PELVIS W CONTRAST  Result Date: 09/15/2020 CLINICAL DATA:  49 year old female with right lower quadrant abdominal pain. EXAM: CT ABDOMEN AND PELVIS WITH CONTRAST TECHNIQUE: Multidetector CT imaging of the abdomen and pelvis was performed using the standard protocol following bolus  administration of intravenous contrast. CONTRAST:  OMNIPAQUE IOHEXOL 300 MG/ML  SOLN COMPARISON:  CT abdomen pelvis dated 07/13/2020. FINDINGS: Lower chest: The visualized lung bases are clear. No intra-abdominal free air or free fluid. Hepatobiliary: The liver is unremarkable. There is mild intrahepatic biliary dilatation. There is distension of the gallbladder. No calcified gallstone or pericholecystic fluid. The common bile duct is mildly dilated measuring 10 mm in diameter. Pancreas: Unremarkable. No pancreatic ductal dilatation or surrounding inflammatory changes. Spleen: Normal in size without focal abnormality. Adrenals/Urinary Tract: The adrenal glands unremarkable. There is no hydronephrosis on either side. There is symmetric enhancement and excretion of contrast by both kidneys. The visualized ureters and urinary bladder appear unremarkable. Stomach/Bowel: Postsurgical changes of bowel with multiple anastomotic sutures. There is no bowel obstruction or active inflammation. The appendix is normal. Vascular/Lymphatic: The abdominal aorta and IVC unremarkable. No portal venous gas. There is no adenopathy. Reproductive: Several small hypodense uterine lesions, possibly fibroids. The ovaries are suboptimally evaluated. A 2 cm ovoid structure in the right hemipelvis (59/2) may represent an ovarian follicle or cyst. The pelvic structures are better evaluated on the ultrasound of 09/15/2020. Follow-up right ovarian structure in 3 months as per recommendation of the ultrasound. Other: None Musculoskeletal: Degenerative changes of the spine. L4-S1 disc spacer and posterior fusion. No acute osseous pathology. IMPRESSION: 1. No acute intra-abdominal or pelvic pathology. No bowel obstruction. Normal appendix. 2. Mild intrahepatic and extrahepatic biliary dilatation. No calcified gallstone or pericholecystic fluid. Electronically Signed   By: Elgie Collard M.D.   On: 09/15/2020 21:54   US PELVIC COMPLETE W  TRANSVAGINAL AND TORSION R/O  Result Date: 09/15/2020 CLINICAL DATA:  Right lower quadrant pain EXAM: TRANSABDOMINAL AND TRANSVAGINAL ULTRASOUND OF PELVIS DOPPLER ULTRASOUND OF OVARIES TECHNIQUE: Both transabdominal and transvaginal ultrasound examinations of the pelvis were performed. Transabdominal technique was performed for global imaging of the pelvis including uterus, ovaries, adnexal regions, and pelvic cul-de-sac. It was necessary to proceed with endovaginal exam following the transabdominal exam to visualize the ovaries. Color and duplex Doppler ultrasound was utilized to evaluate blood flow to the ovaries. COMPARISON:  07/13/2020 FINDINGS: Uterus Measurements: 7.2 x 4.0 x 4.8 cm. = volume: 72 mL. Multiple mildly hypoechoic lesions are noted scattered throughout the uterus. The largest of these measures 1.4 cm in the fundus. This would correspond to areas seen on recent CT examination. Likely related to small fibroids. Endometrium Thickness: 4.2 mm.  No focal abnormality visualized. Right ovary Measurements: 3.1 x 1.3 x 2.4 cm. = volume: 5 mL. 1.9 x 0.9 x 1.3 cm somewhat complex cystic area is noted within the right ovary. This corresponds to a cystic areas seen on recent CT examination and corresponds with the patient's given clinical history. This may represent a simple cyst and adjacent more complex cyst given the appearance. Left ovary Measurements: 1.9 x 0.8 x 1.8 cm. = volume: 1.4 mL. Normal appearance/no adnexal mass. Pulsed Doppler evaluation of both ovaries demonstrates normal low-resistance arterial and venous waveforms. Other findings No abnormal free fluid. IMPRESSION: Multiple mildly hypoechoic lesions are noted throughout the uterus similar to that seen on prior CT examination likely related to uterine fibroids. Changes in the right ovary which may represent a  complex cyst or a simple cyst adjacent to a more echogenic lesion. No internal blood flow is identified. This may represent a cystic  ovarian neoplasm although no malignant features are seen. Follow-up in 3 months is recommended for further evaluation. Electronically Signed   By: Alcide CleverMark  Lukens M.D.   On: 09/15/2020 19:19     Procedures Procedures (including critical care time)  Medications Ordered in ED Medications  ondansetron (ZOFRAN) injection 4 mg (4 mg Intravenous Given 09/15/20 1816)  sodium chloride 0.9 % bolus 500 mL (0 mLs Intravenous Stopped 09/15/20 1920)  morphine 4 MG/ML injection 4 mg (4 mg Intravenous Given 09/15/20 1816)  HYDROmorphone (DILAUDID) injection 1 mg (1 mg Intravenous Given 09/15/20 1933)  iohexol (OMNIPAQUE) 300 MG/ML solution 100 mL (100 mLs Intravenous Contrast Given 09/15/20 2133)    ED Course  I have reviewed the triage vital signs and the nursing notes.  Pertinent labs & imaging results that were available during my care of the patient were reviewed by me and considered in my medical decision making (see chart for details).    MDM Rules/Calculators/A&P                         Patient presents to the ED with complaints of abdominal pain. Patient nontoxic appearing, in no apparent distress, vitals WNL. On exam patient tender to palpation in the right lower quadrant with guarding. Patient with known right ovarian cyst. Will evaluate with labs and Pelvic US. Analgesics, anti-emetics, and fluids administered.   I have independently ordered, reviewed and interpreted all labs and imaging:  CBC: No leukocytosis, normal hgb CMP: No significant electrolyte derrangements, normal renal and liver function Lipase: WNL UA: No hematuria, no signs of UTI Preg test: Neg  Imaging: Pelvic RU:[id number]:Multiple mildly hypoechoic lesions are noted throughout the uterus similar to that seen on prior CT examination likely related to uterine fibroids. Changes in the right ovary which may represent a complex cyst or a simple cyst adjacent to a more echogenic lesion. No internal blood flow is identified. This may  represent a cystic ovarian neoplasm although no malignant features are seen. No evidence of torsion.  Patient still with severe pain, despite this known ovarian cyst that as been present for at least 3 months, concern given severe persistent pain, will get CT to rule out other abnormalities as cause for worsening pain.  CT: No acute intra-abdominal or pelvic pathology. No bowel obstruction. Normal appendix  On repeat abdominal exam patient without peritoneal signs, doubt cholecystitis, pancreatitis, diverticulitis, appendicitis, bowel obstruction/perforation. Patient tolerating PO in the emergency department. Will discharge home with supportive measures. I discussed results, treatment plan, need for OB/GYN follow-up, and return precautions with the patient. Provided opportunity for questions, patient confirmed understanding and is in agreement with plan.    Final Clinical Impression(s) / ED Diagnoses Final diagnoses:  Right ovarian cyst    Rx / DC Orders ED Discharge Orders         Ordered    HYDROcodone-acetaminophen (NORCO) 5-325 MG tablet  Every 6 hours PRN        09/15/20 2229           Dartha LodgeFord, Maximus Hoffert N, PA-C 09/19/20 1311    Milagros Lollykstra, Richard S, MD 09/20/20 [phone]

## 2020-09-22 ENCOUNTER — Encounter: Payer: Self-pay | Admitting: Obstetrics and Gynecology

## 2020-09-22 ENCOUNTER — Ambulatory Visit (INDEPENDENT_AMBULATORY_CARE_PROVIDER_SITE_OTHER): Payer: Medicaid Other | Admitting: Obstetrics and Gynecology

## 2020-09-22 ENCOUNTER — Other Ambulatory Visit: Payer: Self-pay

## 2020-09-22 VITALS — BP 105/68 | HR 63 | Temp 98.9°F | Ht 59.0 in | Wt 133.5 lb

## 2020-09-22 DIAGNOSIS — R102 Pelvic and perineal pain: Secondary | ICD-10-CM | POA: Diagnosis not present

## 2020-09-22 DIAGNOSIS — N83201 Unspecified ovarian cyst, right side: Secondary | ICD-10-CM

## 2020-09-22 NOTE — Progress Notes (Signed)
   Subjective:  CC: ovarian cyst, pelvic pain  Patient ID: Bridget Stevens, female    DOB: [birth year], 49 y.o.   MRN: [id number]  HPI 49 yo G1P1 seen as follow up to previous visit.  Pt has had RLQ pain for approximately 2 months.  She has a previously seen right ovarian cyst seen on u/s.  Pt was scheduled for repeat u/s, but had worsening pain and was seen in ED.  U/s and CT on 11/29 showed ovarian cyst which was decreasing in size.   No other pathology was noted.  The patient states she has constant pain which is sharp in nature.  The pain is not associated with eating.  Emesis is noted, but there is no constipation or diarrhea.  Heating pad is helpful, but there is no improvement with tylenol.  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  The patient also notes dyspareunia, but she states this was present before her pelvic pain.   Review of Systems  Constitutional: Negative.   HENT: Negative.   Respiratory: Negative.   Cardiovascular: Negative.   Gastrointestinal: Positive for vomiting. Negative for constipation and diarrhea.  Genitourinary: Positive for dyspareunia and pelvic pain.  Neurological: Negative.        Objective:   Physical Exam Constitutional:      Appearance: Normal appearance. She is normal weight.  HENT:     Head: Normocephalic and atraumatic.  Pulmonary:     Effort: Pulmonary effort is normal.     Breath sounds: Normal breath sounds.  Neurological:     Mental Status: She is alert.    Vitals:   09/22/20 1017  BP: 105/68  Pulse: 63  Temp: 98.9 F (37.2 C)         Assessment & Plan:   1. Cyst of right ovary Cyst appears to be resolving and there is no other pelvic pathology to account for the pain  2. Pelvic pain in female No obvious etiology for pain, discussed possible need for diagnostic laparoscopy.  Endometriosis is a possibility, but seems less likely due to advanced age and nearing menopause.  May need open/ Hassan approach due to  previous surgeries.    Warden Fillers, MD Faculty Attending, [facility]

## 2020-09-22 NOTE — Patient Instructions (Signed)
Endometriosis  Endometriosis is a condition in which the tissue that lines the uterus (endometrium) grows outside of its normal location. The tissue may grow in many locations close to the uterus, but it commonly grows on the ovaries, fallopian tubes, vagina, or bowel. When the uterus sheds the endometrium every menstrual cycle, there is bleeding wherever the endometrial tissue is located. This can cause pain because blood is irritating to tissues that are not normally exposed to it. What are the causes? The cause of endometriosis is not known. What increases the risk? You may be more likely to develop endometriosis if you:  Have a family history of endometriosis.  Have never given birth.  Started your period at age 10 or younger.  Have high levels of estrogen in your body.  Were exposed to a certain medicine (diethylstilbestrol) before you were born (in utero).  Had low birth weight.  Were born as a twin, triplet, or other multiple.  Have a BMI of less than 25. BMI is an estimate of body fat and is calculated from height and weight. What are the signs or symptoms? Often, there are no symptoms of this condition. If you do have symptoms, they may:  Vary depending on where your endometrial tissue is growing.  Occur during your menstrual period (most common) or midcycle.  Come and go, or you may go months with no symptoms at all.  Stop with menopause. Symptoms may include:  Pain in the back or abdomen.  Heavier bleeding during periods.  Pain during sex.  Painful bowel movements.  Infertility.  Pelvic pain.  Bleeding more than once a month. How is this diagnosed? This condition is diagnosed based on your symptoms and a physical exam. You may have tests, such as:  Blood tests and urine tests. These may be done to help rule out other possible causes of your symptoms.  Ultrasound, to look for abnormal tissues.  An X-ray of the lower bowel (barium enema).  An  ultrasound that is done through the vagina (transvaginally).  CT scan.  MRI.  Laparoscopy. In this procedure, a lighted, pencil-sized instrument called a laparoscope is inserted into your abdomen through an incision. The laparoscope allows your health care provider to look at the organs inside your body and check for abnormal tissue to confirm the diagnosis. If abnormal tissue is found, your health care provider may remove a small piece of tissue (biopsy) to be examined under a microscope. How is this treated? Treatment for this condition may include:  Medicines to relieve pain, such as NSAIDs.  Hormone therapy. This involves using artificial (synthetic) hormones to reduce endometrial tissue growth. Your health care provider may recommend using a hormonal form of birth control, or other medicines.  Surgery. This may be done to remove abnormal endometrial tissue. ? In some cases, tissue may be removed using a laparoscope and a laser (laparoscopic laser treatment). ? In severe cases, surgery may be done to remove the fallopian tubes, uterus, and ovaries (hysterectomy). Follow these instructions at home:  Take over-the-counter and prescription medicines only as told by your health care provider.  Do not drive or use heavy machinery while taking prescription pain medicine.  Try to avoid activities that cause pain, including sexual activity.  Keep all follow-up visits as told by your health care provider. This is important. Contact a health care provider if:  You have pain in the area between your hip bones (pelvic area) that occurs: ? Before, during, or after your period. ?   In between your period and gets worse during your period. ? During or after sex. ? With bowel movements or urination, especially during your period.  You have problems getting pregnant.  You have a fever. Get help right away if:  You have severe pain that does not get better with medicine.  You have severe  nausea and vomiting, or you cannot eat without vomiting.  You have pain that affects only the lower, right side of your abdomen.  You have abdominal pain that gets worse.  You have abdominal swelling.  You have blood in your stool. This information is not intended to replace advice given to you by your health care provider. Make sure you discuss any questions you have with your health care provider. Document Revised: 09/16/2017 Document Reviewed: 03/06/2016 Elsevier Patient Education  2020 Elsevier Inc. Pelvic Pain, Female Pelvic pain is pain in your lower belly (abdomen), below your belly button and between your hips. The pain may start suddenly (be acute), keep coming back (be recurring), or last a long time (become chronic). Pelvic pain that lasts longer than 6 months is called chronic pelvic pain. There are many causes of pelvic pain. Sometimes the cause of pelvic pain is not known. Follow these instructions at home:   Take over-the-counter and prescription medicines only as told by your doctor.  Rest as told by your doctor.  Do not have sex if it hurts.  Keep a journal of your pelvic pain. Write down: ? When the pain started. ? Where the pain is located. ? What seems to make the pain better or worse, such as food or your period (menstrual cycle). ? Any symptoms you have along with the pain.  Keep all follow-up visits as told by your doctor. This is important. Contact a doctor if:  Medicine does not help your pain.  Your pain comes back.  You have new symptoms.  You have unusual discharge or bleeding from your vagina.  You have a fever or chills.  You are having trouble pooping (constipation).  You have blood in your pee (urine) or poop (stool).  Your pee smells bad.  You feel weak or light-headed. Get help right away if:  You have sudden pain that is very bad.  Your pain keeps getting worse.  You have very bad pain and also have any of these symptoms: ? A  fever. ? Feeling sick to your stomach (nausea). ? Throwing up (vomiting). ? Being very sweaty.  You pass out (lose consciousness). Summary  Pelvic pain is pain in your lower belly (abdomen), below your belly button and between your hips.  There are many possible causes of pelvic pain.  Keep a journal of your pelvic pain. This information is not intended to replace advice given to you by your health care provider. Make sure you discuss any questions you have with your health care provider. Document Revised: 03/22/2018 Document Reviewed: 03/22/2018 Elsevier Patient Education  2020 Elsevier Inc.  

## 2020-09-29 ENCOUNTER — Ambulatory Visit: Payer: Medicaid Other

## 2020-10-03 ENCOUNTER — Ambulatory Visit: Payer: Medicaid Other | Admitting: Obstetrics & Gynecology

## 2020-10-04 ENCOUNTER — Ambulatory Visit: Payer: Medicaid Other | Attending: Internal Medicine

## 2020-10-04 DIAGNOSIS — Z23 Encounter for immunization: Secondary | ICD-10-CM

## 2020-10-04 NOTE — Progress Notes (Signed)
   Covid-19 Vaccination Clinic  Name:  Bridget Stevens    MRN: [id number] DOB: January 16, 1971  10/04/2020  Bridget Stevens was observed post Covid-19 immunization for 15 minutes without incident. She was provided with Vaccine Information Sheet and instruction to access the V-Safe system.   Bridget Stevens was instructed to call 911 with any severe reactions post vaccine: Marland Kitchen Difficulty breathing  . Swelling of face and throat  . A fast heartbeat  . A bad rash all over body  . Dizziness and weakness   Immunizations Administered    Name Date Dose VIS Date Route   Pfizer COVID-19 Vaccine 10/04/2020  9:29 AM 0.3 mL 08/06/2020 Intramuscular   Manufacturer: [employer], [employer]   Lot: [id number]   NDC: [address]-8

## 2020-10-09 ENCOUNTER — Ambulatory Visit (INDEPENDENT_AMBULATORY_CARE_PROVIDER_SITE_OTHER): Payer: Medicaid Other | Admitting: Obstetrics and Gynecology

## 2020-10-09 ENCOUNTER — Other Ambulatory Visit: Payer: Self-pay

## 2020-10-09 ENCOUNTER — Encounter: Payer: Self-pay | Admitting: Obstetrics and Gynecology

## 2020-10-09 DIAGNOSIS — N952 Postmenopausal atrophic vaginitis: Secondary | ICD-10-CM

## 2020-10-09 MED ORDER — ESTROGENS, CONJUGATED 0.625 MG/GM VA CREA: TOPICAL_CREAM | VAGINAL | 12 refills | Status: DC

## 2020-10-09 NOTE — Progress Notes (Signed)
RGYN pt presents for problem visit per notes Abdominal pain. Last seen 09/22/2020 for pelvic pain  last note mentioned possible diagnostic laparoscopy   Had F/U scheduled w/Dr.Arnold appt was canceled.  CC: Pelvic Pain per pt , 7/10 sleeping with heating pad taking OTC Rx for pain. Also notes vaginal dryness and bleeding after intercourse.

## 2020-10-09 NOTE — Patient Instructions (Signed)
Atrophic Vaginitis Atrophic vaginitis is a condition in which the tissues that line the vagina become dry and thin. This condition occurs in women who have stopped having their period. It is caused by a drop in a female hormone (estrogen). This hormone helps:  To keep the vagina moist.  To make a clear fluid. This clear fluid helps: ? To make the vagina ready for sex. ? To protect the vagina from infection. If the lining of the vagina is dry and thin, it may cause irritation, burning, or itchiness. It may also:  Make sex painful.  Make an exam of your vagina painful.  Cause bleeding.  Make you lose interest in sex.  Cause a burning feeling when you pee (urinate).  Cause a Durante or yellow fluid to come from your vagina. Some women do not have symptoms. Follow these instructions at home: Medicines  Take over-the-counter and prescription medicines only as told by your doctor.  Do not use herbs or other medicines unless your doctor says it is okay.  Use medicines for for dryness. These include: ? Oils to make the vagina soft. ? Creams. ? Moisturizers. General instructions  Do not douche.  Do not use products that can make your vagina dry. These include: ? Scented sprays. ? Scented tampons. ? Scented soaps.  Sex can help increase blood flow and soften the tissue in the vagina. If it hurts to have sex: ? Tell your partner. ? Use products to make sex more comfortable. Use these only as told by your doctor. Contact a doctor if you:  Have discharge from the vagina that is different than usual.  Have a bad smell coming from your vagina.  Have new symptoms.  Do not get better.  Get worse. Summary  Atrophic vaginitis is a condition in which the lining of the vagina becomes dry and thin.  This condition affects women who have stopped having their periods.  Treatment may include using products that help make the vagina soft.  Call a doctor if do not get better with  treatment. This information is not intended to replace advice given to you by your health care provider. Make sure you discuss any questions you have with your health care provider. Document Revised: 10/17/2017 Document Reviewed: 10/17/2017 Elsevier Patient Education  2020 Elsevier Inc.  

## 2020-10-09 NOTE — Progress Notes (Signed)
   Subjective:    Patient ID: Bridget Stevens, female    DOB: 1971-02-24, 49 y.o.   MRN: [id number] CC: vaginal dryness HPI Pt returns for follow up.  She notes her right sided pain has diminished, but she still uses a heating pad in the evenings.  She still notes dyspareunia, vaginal dryness and some spotting after intercourse.  Of note the patient and her partner do not use external lubrication.  Pt had a uterine ablation so she has not had menses in a while, but she does remember a period of time having hot flashes.   Review of Systems  Constitutional: Negative.   HENT: Negative.   Eyes: Negative.   Respiratory: Negative.   Cardiovascular: Negative.   Gastrointestinal: Negative.   Genitourinary: Positive for dyspareunia, vaginal bleeding and vaginal pain. Negative for pelvic pain.  Neurological: Negative.   Hematological: Negative.   Psychiatric/Behavioral: Negative.        Objective:   Physical Exam Constitutional:      Appearance: Normal appearance. She is normal weight.  HENT:     Head: Normocephalic and atraumatic.  Cardiovascular:     Rate and Rhythm: Normal rate and regular rhythm.     Heart sounds: Normal heart sounds.  Pulmonary:     Effort: Pulmonary effort is normal.     Breath sounds: Normal breath sounds.  Abdominal:     General: Abdomen is flat.     Palpations: Abdomen is soft. There is mass.     Tenderness: There is no guarding.     Comments: External upper abdomen sore to light touch and movement, at level of the ribs, responds to tenderness with feather light touch, no real pelvic pain  Genitourinary:    Comments: Vaginal atrophy noted Skin:    General: Skin is warm and dry.  Neurological:     Mental Status: She is alert.  Psychiatric:        Mood and Affect: Mood normal.        Behavior: Behavior normal.    Vitals:   10/09/20 0903  BP: (!) 97/58  Pulse: 80         Assessment & Plan:   1. Vaginal atrophy Prescribed vaginal estrogen to  alleviate symptoms, even with estrogen, pt advised to use lubricant - conjugated estrogens (PREMARIN) vaginal cream; 1 gram daily x 2 weeks then 0.5 gram twice weekly  Dispense: 42.5 g; Refill: 12  2. Abdominal pain.  Pt advised to follow up with PCP if right rib pain persists, likely neuro or MSK etiology  3.  Pelvic pain: decreased , consider repeat u/s in 6 months for further evaluation   Follow up on estrogen therapy with virtual visit in 3 months If pain worsens or the situation changes, the patient can be seen sooner.  Low medical decision making  Warden Fillers, MD Faculty Attending, Center for [facility]

## 2020-10-31 NOTE — Telephone Encounter (Signed)
Erroneous encounter

## 2021-04-03 ENCOUNTER — Other Ambulatory Visit: Payer: Self-pay

## 2021-06-05 ENCOUNTER — Other Ambulatory Visit: Payer: Self-pay | Admitting: Neurological Surgery

## 2021-06-05 DIAGNOSIS — M5416 Radiculopathy, lumbar region: Secondary | ICD-10-CM

## 2021-06-12 ENCOUNTER — Other Ambulatory Visit: Payer: Self-pay | Admitting: Internal Medicine

## 2021-06-13 LAB — COMPLETE METABOLIC PANEL WITH GFR
AG Ratio: 1.8 (calc) (ref 1.0–2.5)
ALT: 23 U/L (ref 6–29)
AST: 27 U/L (ref 10–35)
Albumin: 4.1 g/dL (ref 3.6–5.1)
Alkaline phosphatase (APISO): 74 U/L (ref 37–153)
BUN: 18 mg/dL (ref 7–25)
CO2: 26 mmol/L (ref 20–32)
Calcium: 9.3 mg/dL (ref 8.6–10.4)
Chloride: 105 mmol/L (ref 98–110)
Creat: 0.6 mg/dL (ref 0.50–1.03)
Globulin: 2.3 g/dL (calc) (ref 1.9–3.7)
Glucose, Bld: 59 mg/dL — ABNORMAL LOW (ref 65–99)
Potassium: 4.3 mmol/L (ref 3.5–5.3)
Sodium: 142 mmol/L (ref 135–146)
Total Bilirubin: 0.4 mg/dL (ref 0.2–1.2)
Total Protein: 6.4 g/dL (ref 6.1–8.1)
eGFR: 108 mL/min/{1.73_m2} (ref >=60)

## 2021-06-13 LAB — VITAMIN D 25 HYDROXY (VIT D DEFICIENCY, FRACTURES): Vit D, 25-Hydroxy: 57 ng/mL (ref 30–100)

## 2021-06-13 LAB — CBC
HCT: 37.6 % (ref 35.0–45.0)
Hemoglobin: 12.5 g/dL (ref 11.7–15.5)
MCH: 32.5 pg (ref 27.0–33.0)
MCHC: 33.2 g/dL (ref 32.0–36.0)
MCV: 97.7 fL (ref 80.0–100.0)
MPV: 9.8 fL (ref 7.5–12.5)
Platelets: 217 10*3/uL (ref 140–400)
RBC: 3.85 10*6/uL (ref 3.80–5.10)
RDW: 12.7 % (ref 11.0–15.0)
WBC: 5.7 10*3/uL (ref 3.8–10.8)

## 2021-06-13 LAB — LIPID PANEL
Cholesterol: 152 mg/dL (ref <200)
HDL: 56 mg/dL (ref >=50)
LDL Cholesterol (Calc): 79 mg/dL (calc)
Non-HDL Cholesterol (Calc): 96 mg/dL (calc) (ref <130)
Total CHOL/HDL Ratio: 2.7 (calc) (ref <5.0)
Triglycerides: 85 mg/dL (ref <150)

## 2021-06-13 LAB — TSH: TSH: 1.6 mIU/L

## 2021-06-25 ENCOUNTER — Other Ambulatory Visit: Payer: Self-pay | Admitting: Internal Medicine

## 2021-06-25 DIAGNOSIS — Z1231 Encounter for screening mammogram for malignant neoplasm of breast: Secondary | ICD-10-CM

## 2021-07-13 ENCOUNTER — Ambulatory Visit: Payer: Medicaid Other | Admitting: Family Medicine

## 2021-07-13 ENCOUNTER — Other Ambulatory Visit: Payer: Self-pay

## 2021-07-13 ENCOUNTER — Encounter: Payer: Self-pay | Admitting: Family Medicine

## 2021-07-13 ENCOUNTER — Other Ambulatory Visit (HOSPITAL_COMMUNITY)
Admission: RE | Admit: 2021-07-13 | Discharge: 2021-07-13 | Disposition: A | Discharge status: Home / Self Care | Payer: Medicaid Other | Source: Ambulatory Visit | Attending: Family Medicine | Admitting: Family Medicine

## 2021-07-13 DIAGNOSIS — N920 Excessive and frequent menstruation with regular cycle: Secondary | ICD-10-CM | POA: Diagnosis present

## 2021-07-13 NOTE — Progress Notes (Signed)
Pt is in the office reporting AUB.  Pt reports hx of ablation of 2013, reports bleeding with intercourse. Last pap 08-28-2020

## 2021-07-13 NOTE — Progress Notes (Signed)
GYNECOLOGY OFFICE VISIT NOTE  History:   Bridget Stevens is a 50 y.o. G1P1 here today for post-coital bleeding.  Patient states that she has a hx of endometrial ablation in 2013 and has not had periods since then. Recently, she started having bleeding after sex. It is very brief and only lasts for a few hours, up to 1 day on average. She does not have any bleeding outside of this. Her last pap smear in November of 2021 was normal. She reports having hot flashes and such and thinks she is post-menopausal at this point but is not sure. No abnormal discharge or other vaginal irritation. Using vaginal estrogen 2x per week. Wondering why this is happening and if she should just have a hysterectomy at this point.    Past Medical History:  Diagnosis Date   Anxiety    Arthritis    Asthma    GERD (gastroesophageal reflux disease)    Headache    Hepatomegaly    Hx of migraines    Hyperlipidemia    Ovarian cyst    Thyroid disease     Past Surgical History:  Procedure Laterality Date   ANTERIOR CERVICAL DECOMP/DISCECTOMY FUSION N/A 02/18/2020   Procedure: Anterior Cervical Decompression/Discectomy Fusion - Cervical five-Cervcal six;  Surgeon: Tia Alert, MD;  Location: [facility] OR;  Service: Neurosurgery;  Laterality: N/A;   BTL  07/30/2008   CARPAL TUNNEL RELEASE Right    CARPAL TUNNEL RELEASE Left 09/22/2016   Procedure: CARPAL TUNNEL RELEASE, left;  Surgeon: Dairl Ponder, MD;  Location: [facility];  Service: Orthopedics;  Laterality: Left;   CESAREAN SECTION     DORSAL COMPARTMENT RELEASE Left 09/22/2016   Procedure: RELEASE DORSAL COMPARTMENT (DEQUERVAIN);  Surgeon: Dairl Ponder, MD;  Location: [facility];  Service: Orthopedics;  Laterality: Left;   HYSTEROSCOPY W/ ENDOMETRIAL ABLATION     TUBAL LIGATION     WISDOM TOOTH EXTRACTION      The following portions of the patient's history were reviewed and updated as appropriate: allergies, current  medications, past family history, past medical history, past social history, past surgical history and problem list.   Health Maintenance:  Normal pap and negative HRHPV on 08/28/2020.  Normal mammogram on 07/18/2020.   Review of Systems:  Pertinent items noted in HPI and remainder of comprehensive ROS otherwise negative.  Physical Exam:  Vital signs not entered by nursing/medical staff. HDS by physical exam below.  CONSTITUTIONAL: Well-developed, well-nourished female in no acute distress.  CARDIOVASCULAR: Warm and well-perfused. RESPIRATORY: Normal work of breathing on room air, no respiratory distress.  PELVIC: Normal external genitalia without lesions, atrophic vaginal mucosa, cervix unremarkable without lesions, vaginal tissue easily bleeds upon contact with speculum, no abnormal discharge noted. SKIN: No rashes or lesions noted. MUSCULOSKELETAL: Normal range of motion. No LE edema noted. Normal gait. NEUROLOGIC: Alert and oriented to person, place, and time. No focal deficit noted.  PSYCHIATRIC: Normal mood and affect. Normal behavior. Normal judgment and thought content.  Labs and Imaging No results found for this or any previous visit (from the past 168 hour(s)). No results found.    Assessment and Plan:   1. Spotting Brief episodes of spotting after intercourse. S/p endometrial ablation and does not have regular periods/uterine bleeding outside of this. Reports menopausal symptoms though last FSH in 08/2020 not quite yet in post-menopausal range. Exam as above with atrophic vaginal tissue that easily bleeds upon contact with speculum. Suspect this is patient's source of bleeding.  Will rule out infectious cause and obtain pelvic US to assess endometrial lining. Discussed that bleeding is unlikely to be coming from uterine lining, but advised the EMB may be necessary if US shows any abnormalities in this area. Patient has questions about pursuing hysterectomy in general. Discussed that  a hysterectomy would not alleviate bleeding from atrophic vaginal tissue. Patient would still like to discuss hysterectomy. Will have patient follow up with next available GYN provider after additional work up.  - Cervicovaginal ancillary only( [place]) - US PELVIC COMPLETE WITH TRANSVAGINAL; Future  Routine preventative health maintenance measures emphasized. Please refer to After Visit Summary for other counseling recommendations.   Return in about 4 weeks (around 08/10/2021) for follow up with OB/GYN.    Evalina Field, MD OB Fellow, Faculty [facility], Center for [facility] 07/14/2021 5:45 PM

## 2021-07-14 ENCOUNTER — Encounter: Payer: Self-pay | Admitting: Family Medicine

## 2021-07-14 ENCOUNTER — Other Ambulatory Visit: Payer: Self-pay | Admitting: Physician Assistant

## 2021-07-14 DIAGNOSIS — Z1231 Encounter for screening mammogram for malignant neoplasm of breast: Secondary | ICD-10-CM

## 2021-07-15 ENCOUNTER — Other Ambulatory Visit: Payer: Self-pay | Admitting: Family Medicine

## 2021-07-15 DIAGNOSIS — N760 Acute vaginitis: Secondary | ICD-10-CM

## 2021-07-15 DIAGNOSIS — A749 Chlamydial infection, unspecified: Secondary | ICD-10-CM

## 2021-07-15 DIAGNOSIS — B9689 Other specified bacterial agents as the cause of diseases classified elsewhere: Secondary | ICD-10-CM

## 2021-07-15 LAB — CERVICOVAGINAL ANCILLARY ONLY
Bacterial Vaginitis (gardnerella): POSITIVE — AB
Candida Glabrata: NEGATIVE
Candida Vaginitis: NEGATIVE
Chlamydia: POSITIVE — AB
Comment: NEGATIVE
Comment: NEGATIVE
Comment: NEGATIVE
Comment: NEGATIVE
Comment: NEGATIVE
Comment: NORMAL
Neisseria Gonorrhea: NEGATIVE
Trichomonas: NEGATIVE

## 2021-07-15 MED ORDER — METRONIDAZOLE 500 MG PO TABS: 500.0000 mg | ORAL_TABLET | Freq: Two times a day (BID) | ORAL | 0 refills | Status: AC

## 2021-07-15 MED ORDER — DOXYCYCLINE HYCLATE 100 MG PO CAPS: 100.0000 mg | ORAL_CAPSULE | Freq: Two times a day (BID) | ORAL | 0 refills | Status: AC

## 2021-07-16 ENCOUNTER — Encounter: Payer: Self-pay | Admitting: *Deleted

## 2021-07-22 ENCOUNTER — Other Ambulatory Visit: Payer: Medicaid Other

## 2021-08-03 ENCOUNTER — Other Ambulatory Visit: Payer: Self-pay

## 2021-08-03 ENCOUNTER — Ambulatory Visit
Admission: RE | Admit: 2021-08-03 | Discharge: 2021-08-03 | Disposition: A | Discharge status: Home / Self Care | Payer: Medicaid Other | Source: Ambulatory Visit | Attending: Internal Medicine | Admitting: Internal Medicine

## 2021-08-03 DIAGNOSIS — Z1231 Encounter for screening mammogram for malignant neoplasm of breast: Secondary | ICD-10-CM

## 2021-08-10 ENCOUNTER — Other Ambulatory Visit: Payer: Self-pay | Admitting: Physician Assistant

## 2021-08-10 ENCOUNTER — Other Ambulatory Visit: Payer: Self-pay

## 2021-08-10 ENCOUNTER — Ambulatory Visit: Payer: Medicaid Other | Attending: Internal Medicine

## 2021-08-10 ENCOUNTER — Other Ambulatory Visit (HOSPITAL_BASED_OUTPATIENT_CLINIC_OR_DEPARTMENT_OTHER): Payer: Self-pay

## 2021-08-10 ENCOUNTER — Ambulatory Visit: Payer: Medicaid Other | Admitting: Obstetrics and Gynecology

## 2021-08-10 DIAGNOSIS — R928 Other abnormal and inconclusive findings on diagnostic imaging of breast: Secondary | ICD-10-CM

## 2021-08-10 DIAGNOSIS — Z23 Encounter for immunization: Secondary | ICD-10-CM

## 2021-08-10 MED ORDER — PFIZER COVID-19 VAC BIVALENT 30 MCG/0.3ML IM SUSP
INTRAMUSCULAR | 0 refills | Status: DC
Start: 2021-08-10 — End: 2022-05-21
  Filled 2021-08-10: qty 0.3, 1d supply, fill #0

## 2021-08-10 NOTE — Progress Notes (Signed)
   Covid-19 Vaccination Clinic  Name:  Bridget Stevens    MRN: [id number] DOB: 1971-04-27  08/10/2021  [name] was observed post Covid-19 immunization for 15 minutes without incident. She was provided with Vaccine Information Sheet and instruction to access the V-Safe system.   [name] was instructed to call 911 with any severe reactions post vaccine: Difficulty breathing  Swelling of face and throat  A fast heartbeat  A bad rash all over body  Dizziness and weakness   Immunizations Administered     Name Date Dose VIS Date Route   Pfizer Covid-19 Vaccine Bivalent Booster 08/10/2021  3:11 PM 0.3 mL 06/17/2021 Intramuscular   Manufacturer: [employer], [employer]   Lot: [id number]   NDC: [phone]

## 2021-08-13 ENCOUNTER — Ambulatory Visit: Payer: Medicaid Other

## 2021-08-13 ENCOUNTER — Ambulatory Visit
Admission: RE | Admit: 2021-08-13 | Discharge: 2021-08-13 | Disposition: A | Discharge status: Home / Self Care | Payer: Medicaid Other | Source: Ambulatory Visit | Attending: Physician Assistant | Admitting: Physician Assistant

## 2021-08-13 ENCOUNTER — Other Ambulatory Visit: Payer: Self-pay

## 2021-08-13 DIAGNOSIS — R928 Other abnormal and inconclusive findings on diagnostic imaging of breast: Secondary | ICD-10-CM

## 2021-08-13 IMAGING — MG MM DIGITAL DIAGNOSTIC UNILAT*R* W/ TOMO W/ CAD
4 series · 4 of 12 positions shown · non-contrast
Comparison: Previous exam(s).

CLINICAL DATA: The patient was called back for a right breast
asymmetry.

EXAM:
DIGITAL DIAGNOSTIC UNILATERAL RIGHT MAMMOGRAM WITH TOMOSYNTHESIS AND
CAD
TECHNIQUE: Right digital diagnostic mammography and breast tomosynthesis was
performed. The images were evaluated with computer-aided detection.

[R ML synth-2D]
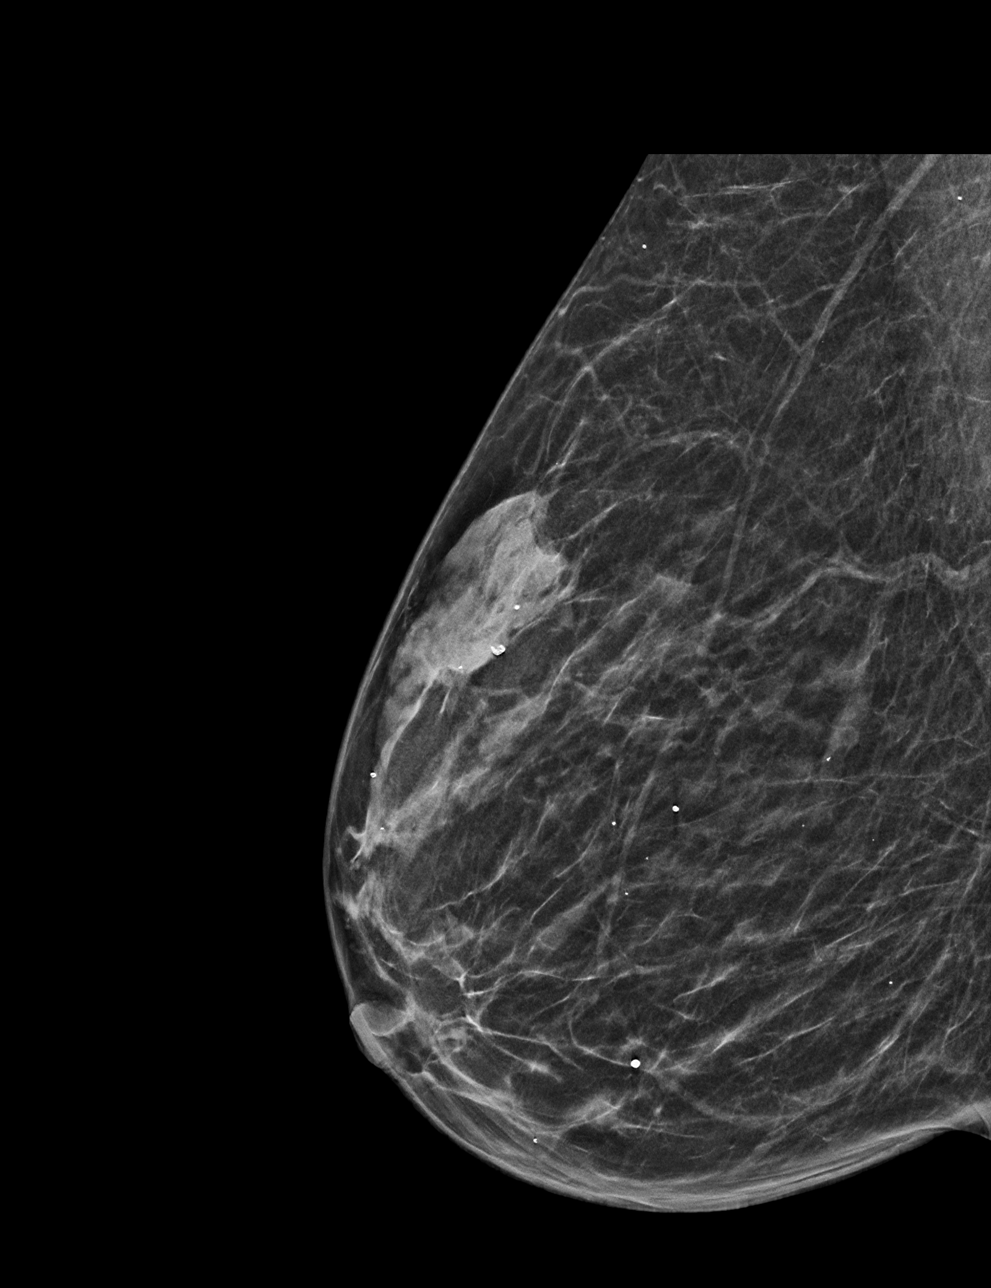

[R MLO synth-2D]
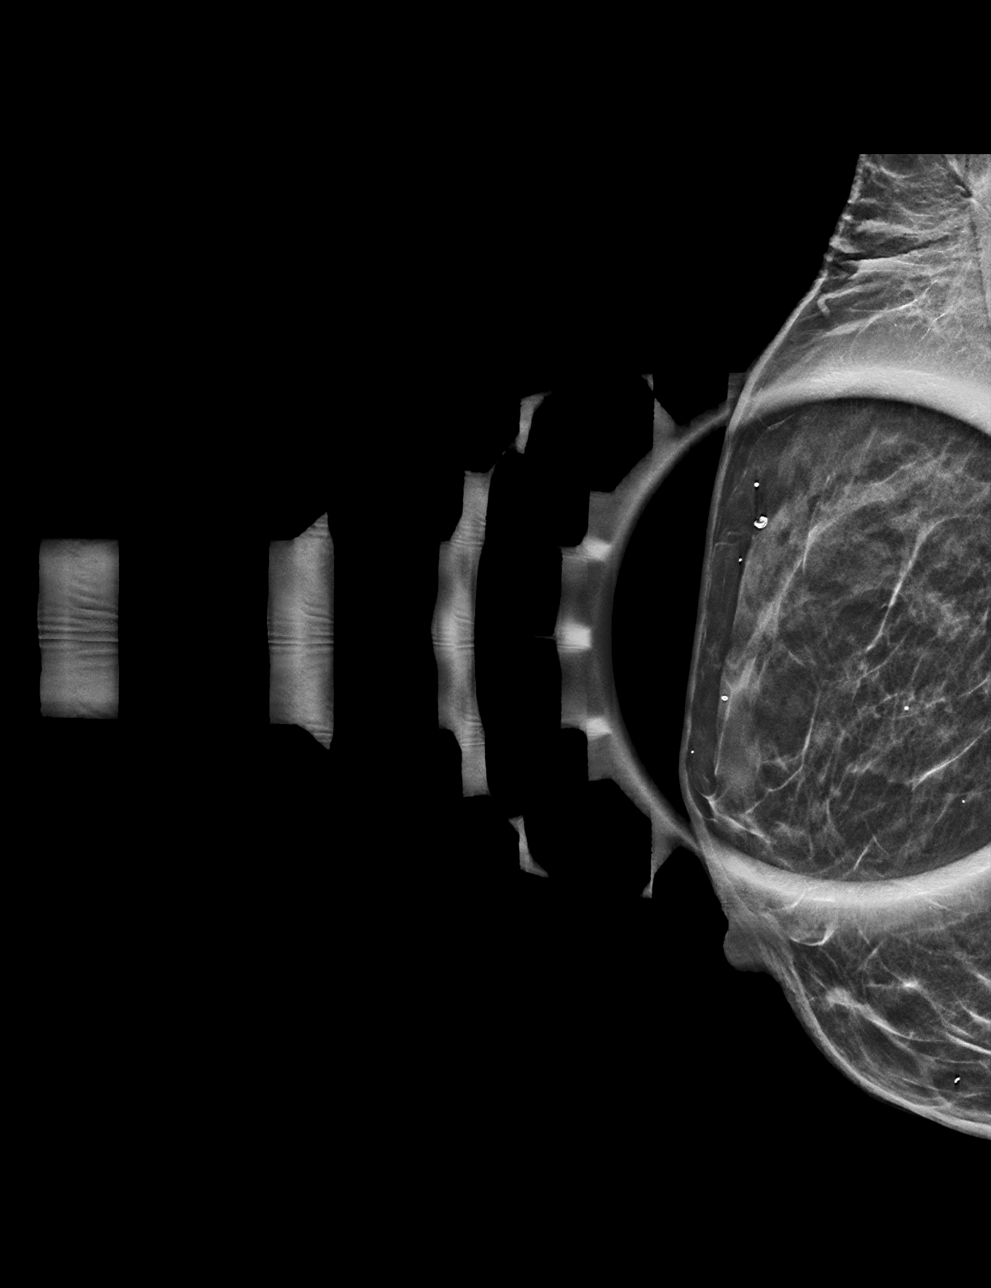

[R ML tomo · tomo slice 21/41.0]
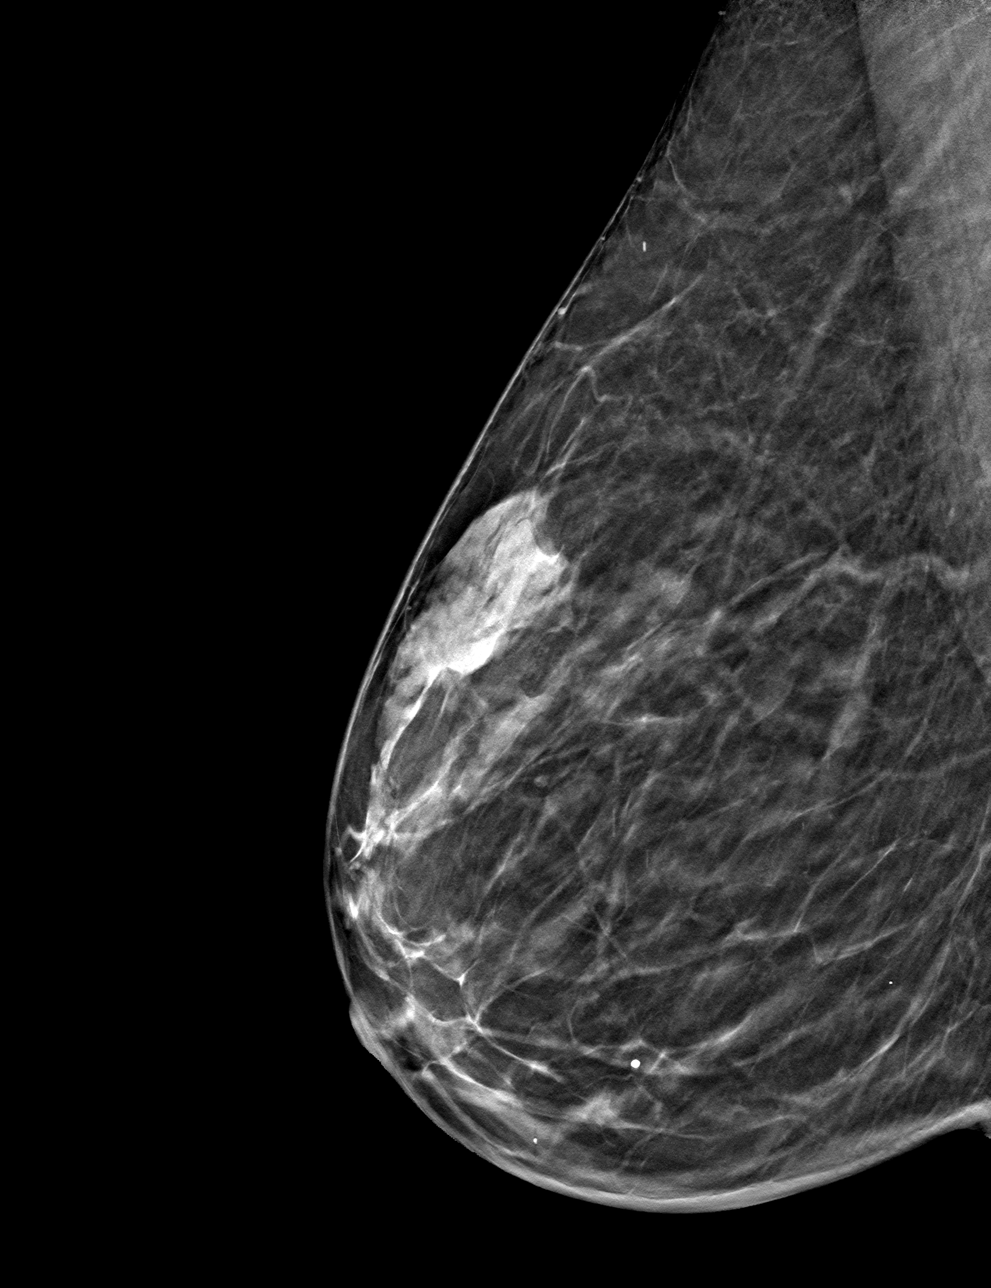

[R MLO tomo · tomo slice 17/32.0]
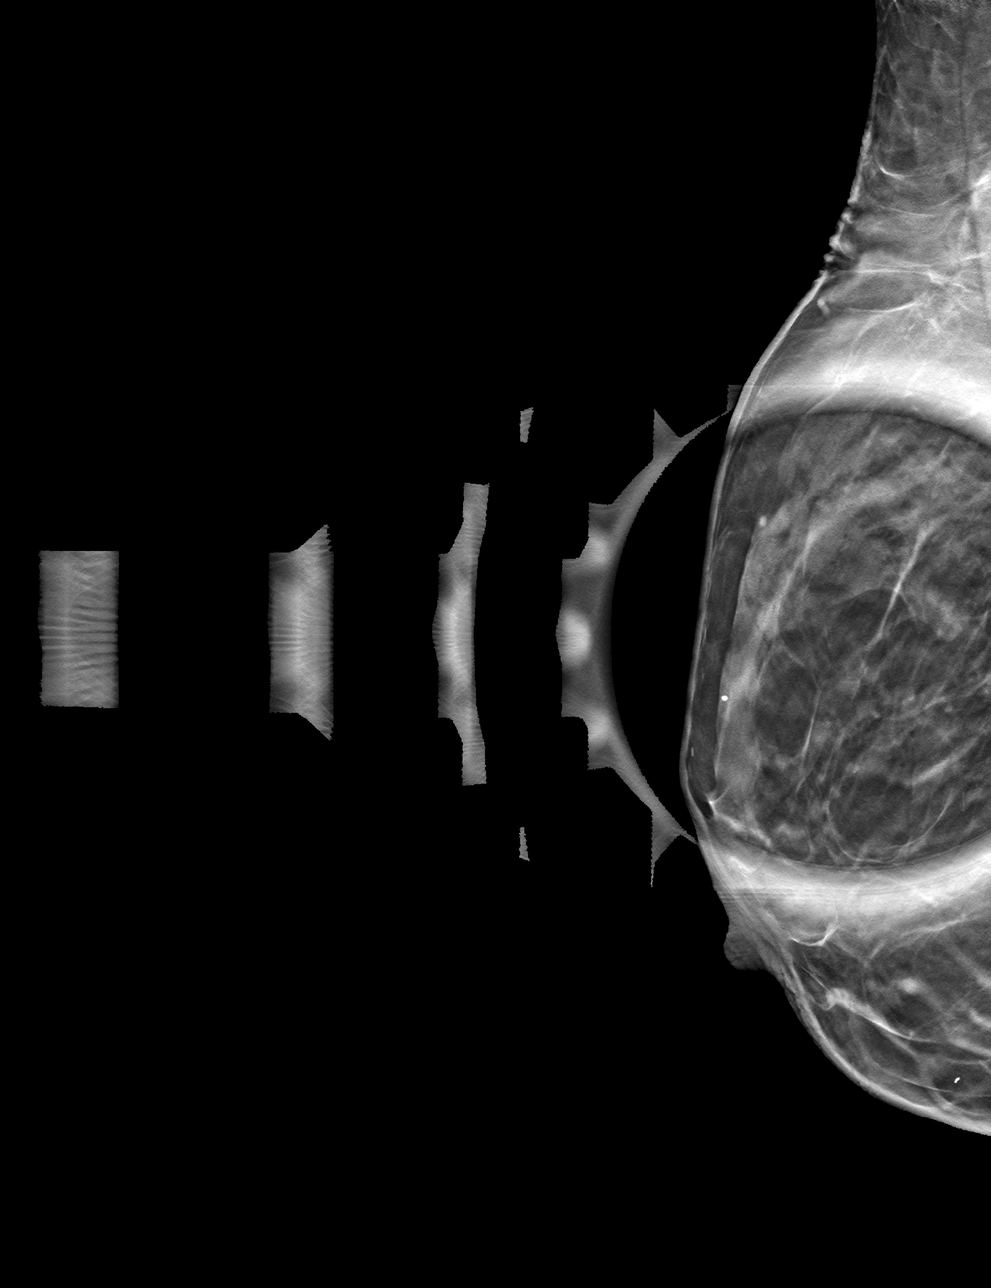

[4 of 12 positions shown; findings below may reference images not displayed]

ACR Breast Density Category b: There are scattered areas of
fibroglandular density.
FINDINGS: The right breast asymmetry resolves on today's imaging.
IMPRESSION: No mammographic evidence of malignancy.

RECOMMENDATION:
Annual screening mammography.

I have discussed the findings and recommendations with the patient.
If applicable, a reminder letter will be sent to the patient
regarding the next appointment.

BI-RADS CATEGORY  2: Benign.

## 2021-08-14 ENCOUNTER — Encounter: Payer: Self-pay | Admitting: Obstetrics & Gynecology

## 2021-08-14 ENCOUNTER — Other Ambulatory Visit (HOSPITAL_COMMUNITY)
Admission: RE | Admit: 2021-08-14 | Discharge: 2021-08-14 | Disposition: A | Discharge status: Home / Self Care | Payer: Medicaid Other | Source: Ambulatory Visit | Attending: Obstetrics and Gynecology | Admitting: Obstetrics and Gynecology

## 2021-08-14 ENCOUNTER — Other Ambulatory Visit: Payer: Self-pay | Admitting: Obstetrics & Gynecology

## 2021-08-14 ENCOUNTER — Ambulatory Visit (INDEPENDENT_AMBULATORY_CARE_PROVIDER_SITE_OTHER): Payer: Medicaid Other | Admitting: Obstetrics & Gynecology

## 2021-08-14 VITALS — BP 101/68 | HR 75 | Wt 132.2 lb

## 2021-08-14 DIAGNOSIS — N951 Menopausal and female climacteric states: Secondary | ICD-10-CM

## 2021-08-14 DIAGNOSIS — A749 Chlamydial infection, unspecified: Secondary | ICD-10-CM | POA: Diagnosis not present

## 2021-08-14 DIAGNOSIS — N924 Excessive bleeding in the premenopausal period: Secondary | ICD-10-CM

## 2021-08-14 DIAGNOSIS — Z9889 Other specified postprocedural states: Secondary | ICD-10-CM

## 2021-08-14 NOTE — Progress Notes (Signed)
Patient ID: Bridget Stevens, female   DOB: 1971/07/26, 50 y.o.   MRN: [id number]  No chief complaint on file. Vaginal spotting, f/u chlamydia infection and treatment  HPI Bridget Stevens is a 50 y.o. female.  G1P1 No LMP recorded. Patient has had an ablation. She was seen for vaginal bleeding last month and was positive for chlamydia. She was treated but her partner was not. Dr. Mathis Fare cancelled the [place] she ordered pending f/u today. HPI  Past Medical History:  Diagnosis Date   Anxiety    Arthritis    Asthma    GERD (gastroesophageal reflux disease)    Headache    Hepatomegaly    Hx of migraines    Hyperlipidemia    Ovarian cyst    Thyroid disease     Past Surgical History:  Procedure Laterality Date   ANTERIOR CERVICAL DECOMP/DISCECTOMY FUSION N/A 02/18/2020   Procedure: Anterior Cervical Decompression/Discectomy Fusion - Cervical five-Cervcal six;  Surgeon: Tia Alert, MD;  Location: [facility] OR;  Service: Neurosurgery;  Laterality: N/A;   BTL  07/30/2008   CARPAL TUNNEL RELEASE Right    CARPAL TUNNEL RELEASE Left 09/22/2016   Procedure: CARPAL TUNNEL RELEASE, left;  Surgeon: Dairl Ponder, MD;  Location: [facility];  Service: Orthopedics;  Laterality: Left;   CESAREAN SECTION     DORSAL COMPARTMENT RELEASE Left 09/22/2016   Procedure: RELEASE DORSAL COMPARTMENT (DEQUERVAIN);  Surgeon: Dairl Ponder, MD;  Location:  SURGERY CENTER;  Service: Orthopedics;  Laterality: Left;   HYSTEROSCOPY W/ ENDOMETRIAL ABLATION     TUBAL LIGATION     WISDOM TOOTH EXTRACTION      Family History  Problem Relation Age of Onset   Diabetes Father    Hypertension Father    Hyperlipidemia Father    Thyroid disease Father    Asthma Father    Cancer Mother    Hypertension Mother    Thyroid disease Mother    Cancer Sister        CERVICAL   Thyroid disease Sister    Asthma Sister    Asthma Brother    Breast cancer Maternal Grandmother        in her 26s    Social  History Social History   Tobacco Use   Smoking status: Never   Smokeless tobacco: Never  Vaping Use   Vaping Use: Never used  Substance Use Topics   Alcohol use: Not Currently   Drug use: No    Allergies  Allergen Reactions   Shellfish Allergy Anaphylaxis   Peanut-Containing Drug Products Swelling    SWELLING REACTION UNSPECIFIED    Sulfa Antibiotics Hives   Advil [Ibuprofen]     Due to gastric surgery   Percocet [Oxycodone-Acetaminophen] Other (See Comments)    PT STATES THAT SHE HAS HEADACHES WITH THIS MED    Current Outpatient Medications  Medication Sig Dispense Refill   Calcium Carb-Cholecalciferol (QC CALCIUM 600 +D3 PO) Take 1 tablet by mouth daily.     calcium elemental as carbonate (BARIATRIC TUMS ULTRA) 400 MG chewable tablet Chew 1,000-2,000 mg by mouth 3 (three) times daily as needed for heartburn (acid reflux/indigestion.).     Cholecalciferol (VITAMIN D) 50 MCG (2000 UT) CAPS Take 2,000 Units by mouth every evening.      COVID-19 mRNA bivalent vaccine, Pfizer, (PFIZER COVID-19 VAC BIVALENT) injection Inject into the muscle. 0.3 mL 0   cycloSPORINE (RESTASIS) 0.05 % ophthalmic emulsion Place 1 drop into both eyes 2 (two) times daily  as needed (dry/irritated eyes.).      EPINEPHrine 0.3 mg/0.3 mL IJ SOAJ injection Inject 0.3 mg into the muscle as needed for anaphylaxis.     gabapentin (NEURONTIN) 300 MG capsule Take 300 mg by mouth in the morning, at noon, and at bedtime.     methocarbamol (ROBAXIN) 500 MG tablet Take 500 mg by mouth in the morning and at bedtime.     omeprazole (PRILOSEC) 20 MG capsule Take 20 mg by mouth daily.     Pediatric Multiple Vit-C-FA (MULTIVITAMIN ANIMAL SHAPES, WITH CA/FA,) with C & FA chewable tablet Chew 1 tablet by mouth 2 (two) times daily.     acetaminophen (TYLENOL) 500 MG tablet Take 1,000 mg by mouth every 6 (six) hours as needed for moderate pain.      albuterol (PROVENTIL HFA;VENTOLIN HFA) 108 (90 Base) MCG/ACT inhaler Inhale 2  puffs into the lungs every 4 (four) hours as needed for wheezing or shortness of breath.      cephALEXin (KEFLEX) 500 MG capsule Take 1 capsule (500 mg total) by mouth 4 (four) times daily. (Patient not taking: Reported on 08/28/2020) 28 capsule 0   conjugated estrogens (PREMARIN) vaginal cream 1 gram daily x 2 weeks then 0.5 gram twice weekly (Patient not taking: Reported on 08/14/2021) 42.5 g 12   fluticasone (FLONASE) 50 MCG/ACT nasal spray Place 1 spray into both nostrils daily as needed for allergies.      HYDROcodone-acetaminophen (NORCO) 5-325 MG tablet Take 1 tablet by mouth every 6 (six) hours as needed. (Patient not taking: Reported on 07/13/2021) 10 tablet 0   Olopatadine HCl (PAZEO) 0.7 % SOLN Place 1 drop into both eyes daily.      oxyCODONE-acetaminophen (PERCOCET/ROXICET) 5-325 MG tablet Take 1 tablet by mouth every 6 (six) hours as needed. (Patient not taking: Reported on 08/28/2020) 30 tablet 0   polyethylene glycol (MIRALAX / GLYCOLAX) 17 g packet Take 17 g by mouth daily as needed (constipation.).  (Patient not taking: No sig reported)     tiZANidine (ZANAFLEX) 4 MG tablet Take 1 tablet (4 mg total) by mouth every 6 (six) hours as needed for muscle spasms. 30 tablet 0   No current facility-administered medications for this visit.    Review of Systems Review of Systems  Constitutional: Negative.   Respiratory: Negative.    Cardiovascular: Negative.   Genitourinary:  Positive for vaginal bleeding.   Blood pressure 101/68, pulse 75, weight 132 lb 3.2 oz (60 kg).  Physical Exam Physical Exam Vitals and nursing note reviewed. Exam conducted with a chaperone present.  Genitourinary:    General: Normal vulva.     Vagina: Vaginal discharge present.     Cervix: Friability present.     Uterus: Normal.      Adnexa: Right adnexa normal and left adnexa normal.    Data Reviewed CT test positive  Assessment Perimenopausal - Plan: FSH  S/P endometrial ablation -  04/2012  Chlamydia infection  Abnormal perimenopausal bleeding - Plan: US PELVIS TRANSVAGINAL NON-OB (TV ONLY)   Plan F/u US result and STD test    Scheryl Darter 08/14/2021, 10:41 AM

## 2021-08-14 NOTE — Addendum Note (Signed)
Addended by: Adam Phenix on: 08/14/2021 12:08 PM   Modules accepted: Orders

## 2021-08-14 NOTE — Progress Notes (Signed)
Follow Up- medication

## 2021-08-15 LAB — FOLLICLE STIMULATING HORMONE: FSH: 26.6 m[IU]/mL

## 2021-08-17 ENCOUNTER — Encounter: Payer: Self-pay | Admitting: *Deleted

## 2021-08-17 ENCOUNTER — Telehealth: Payer: Self-pay | Admitting: *Deleted

## 2021-08-17 LAB — CERVICOVAGINAL ANCILLARY ONLY
Bacterial Vaginitis (gardnerella): POSITIVE — AB
Candida Glabrata: NEGATIVE
Candida Vaginitis: NEGATIVE
Chlamydia: NEGATIVE
Comment: NEGATIVE
Comment: NEGATIVE
Comment: NEGATIVE
Comment: NEGATIVE
Comment: NEGATIVE
Comment: NORMAL
Neisseria Gonorrhea: NEGATIVE
Trichomonas: NEGATIVE

## 2021-08-17 NOTE — Telephone Encounter (Signed)
Returned TC regarding test results. No answer. VM notifying of MyChart message. MyChart message indicating Dr. Debroah Loop would review results and discuss with the patient. Staff message sent to Dr. Debroah Loop.

## 2021-08-18 ENCOUNTER — Ambulatory Visit (HOSPITAL_BASED_OUTPATIENT_CLINIC_OR_DEPARTMENT_OTHER): Payer: Medicaid Other

## 2021-08-19 ENCOUNTER — Other Ambulatory Visit: Payer: Self-pay | Admitting: Obstetrics & Gynecology

## 2021-08-19 DIAGNOSIS — B9689 Other specified bacterial agents as the cause of diseases classified elsewhere: Secondary | ICD-10-CM

## 2021-08-19 MED ORDER — METRONIDAZOLE 500 MG PO TABS: 500.0000 mg | ORAL_TABLET | Freq: Two times a day (BID) | ORAL | 0 refills | Status: AC

## 2021-08-20 ENCOUNTER — Other Ambulatory Visit: Payer: Self-pay | Admitting: Obstetrics & Gynecology

## 2021-08-20 ENCOUNTER — Ambulatory Visit (HOSPITAL_BASED_OUTPATIENT_CLINIC_OR_DEPARTMENT_OTHER)
Admission: RE | Admit: 2021-08-20 | Discharge: 2021-08-20 | Disposition: A | Discharge status: Home / Self Care | Payer: Medicaid Other | Source: Ambulatory Visit | Attending: Obstetrics & Gynecology | Admitting: Obstetrics & Gynecology

## 2021-08-20 ENCOUNTER — Other Ambulatory Visit: Payer: Self-pay

## 2021-08-20 DIAGNOSIS — N924 Excessive bleeding in the premenopausal period: Secondary | ICD-10-CM | POA: Insufficient documentation

## 2021-08-20 IMAGING — US US PELVIS COMPLETE WITH TRANSVAGINAL
1 series · 13 of 25 positions shown · non-contrast
Comparison: [DATE]

CLINICAL DATA: Abnormal perimenopausal bleeding, dysfunctional
uterine bleeding for 1 month, prior endometrial ablation, recent
procedure with gyn

EXAM:
TRANSABDOMINAL AND TRANSVAGINAL ULTRASOUND OF PELVIS
TECHNIQUE: Both transabdominal and transvaginal ultrasound examinations of the
pelvis were performed. Transabdominal technique was performed for
global imaging of the pelvis including uterus, ovaries, adnexal
regions, and pelvic cul-de-sac. It was necessary to proceed with
endovaginal exam following the transabdominal exam to visualize the
endometrium, uterus, and cervix.

[Series 1: us pelvis transvaginal non-ob (tv only) · 90 acquisitions, 13 frames shown]
[im 1/90]
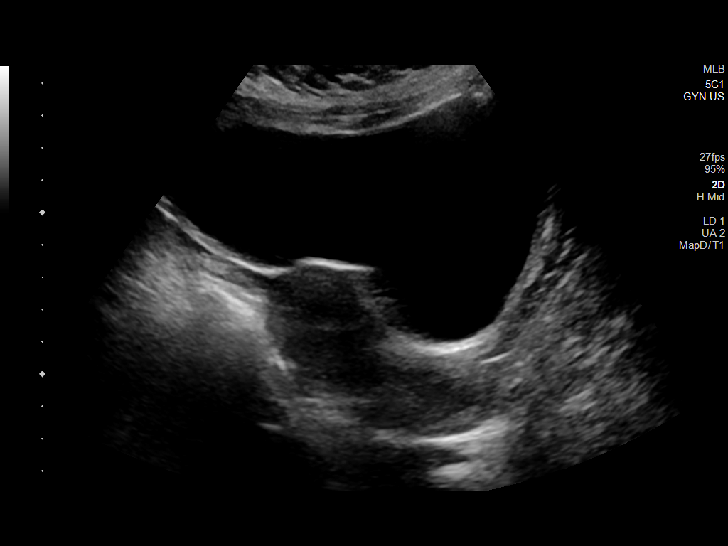
[im 8/90]
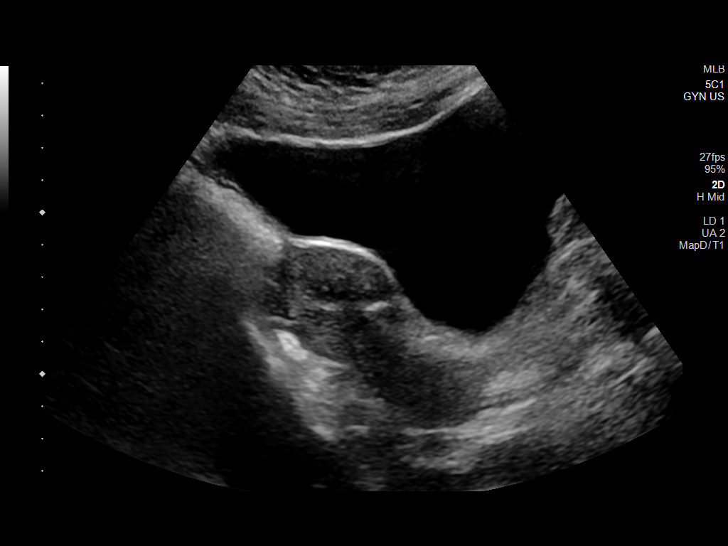
[im 15/90]
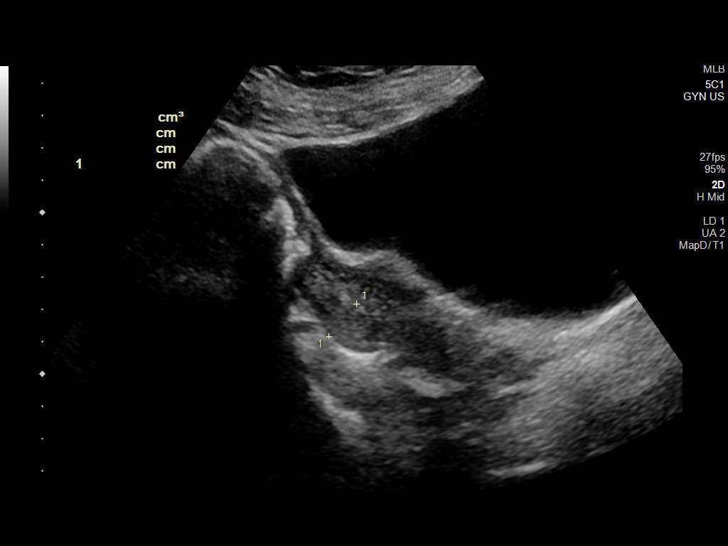
[im 23/90]
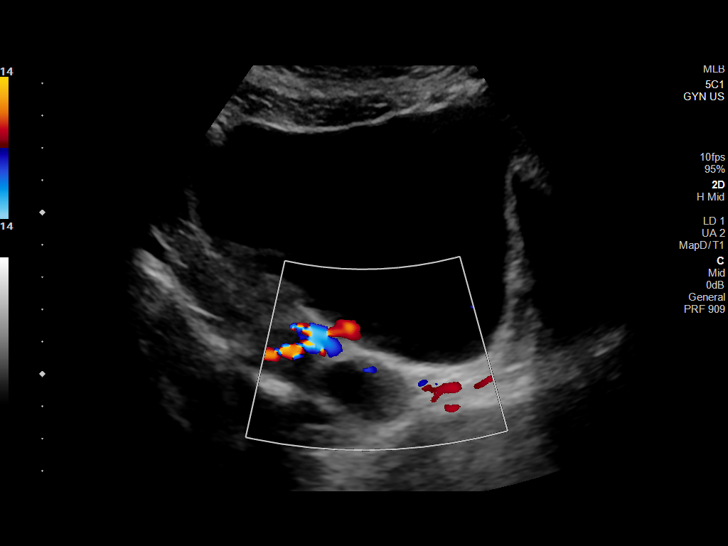
[im 30/90]
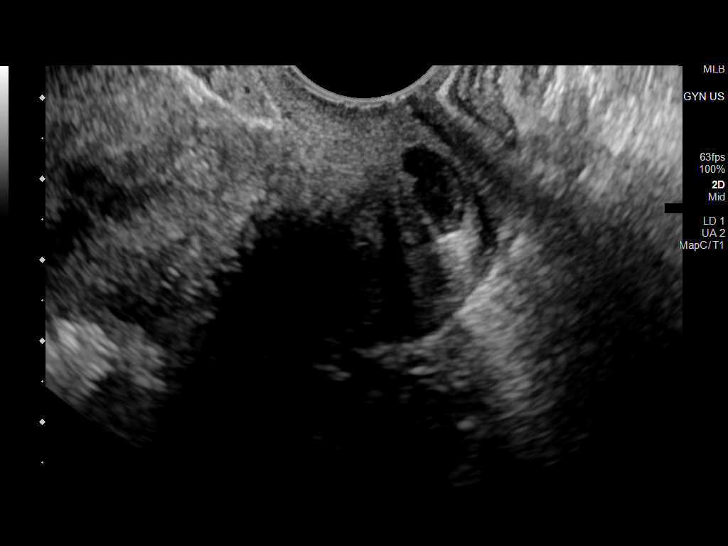
[im 38/90]
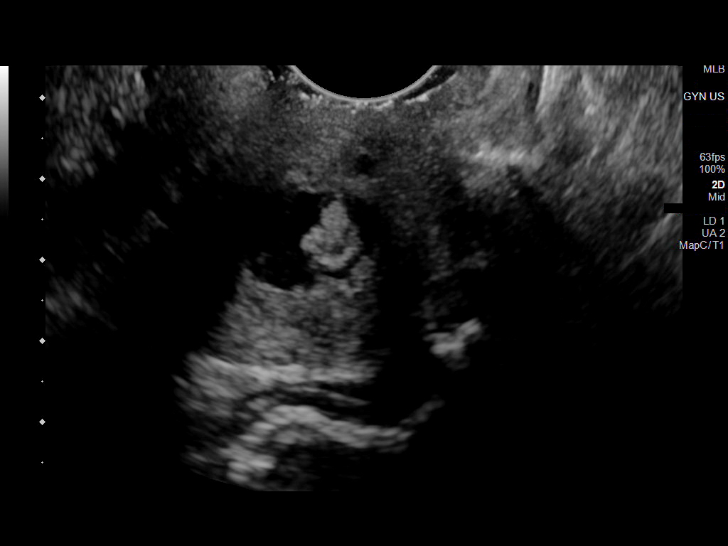
[im 45/90]
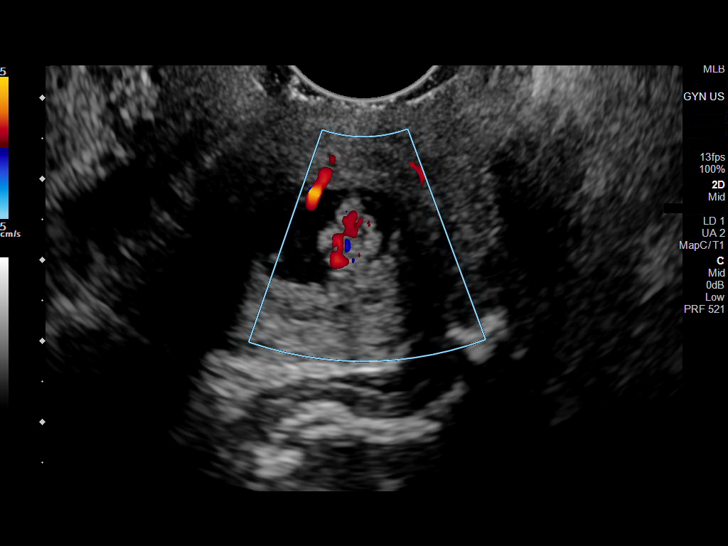
[im 52/90]
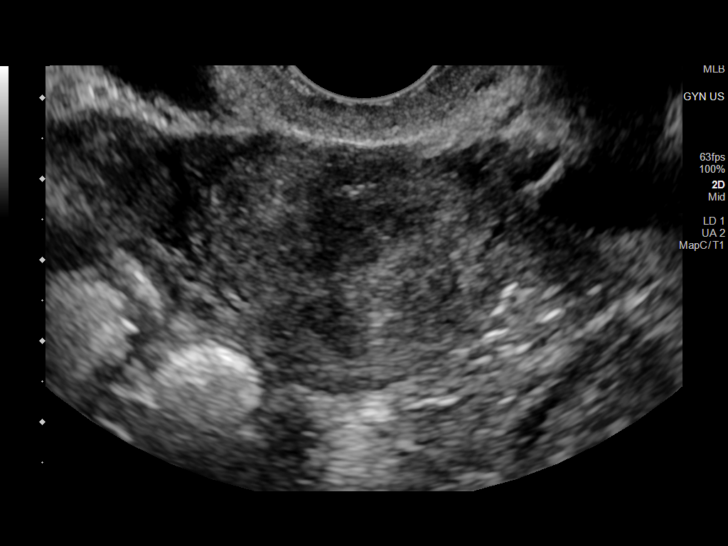
[im 60/90]
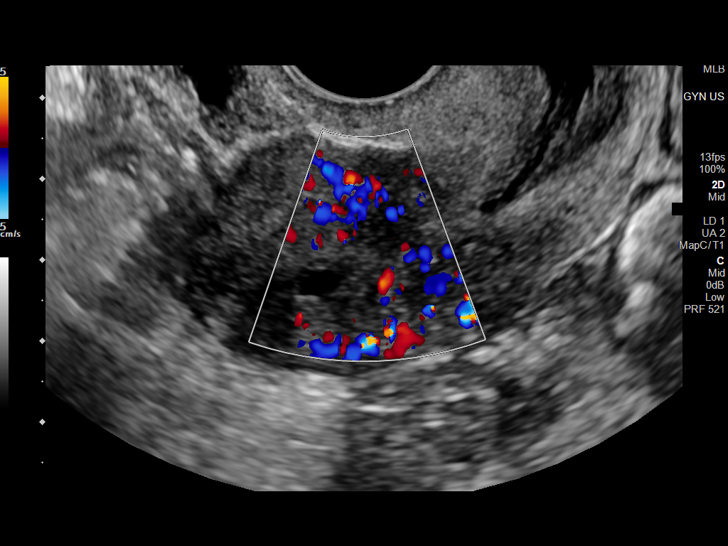
[im 67/90]
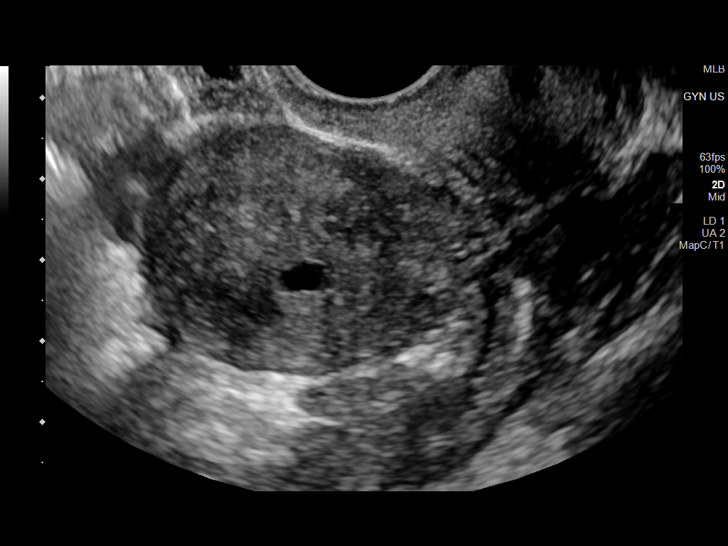
[im 75/90]
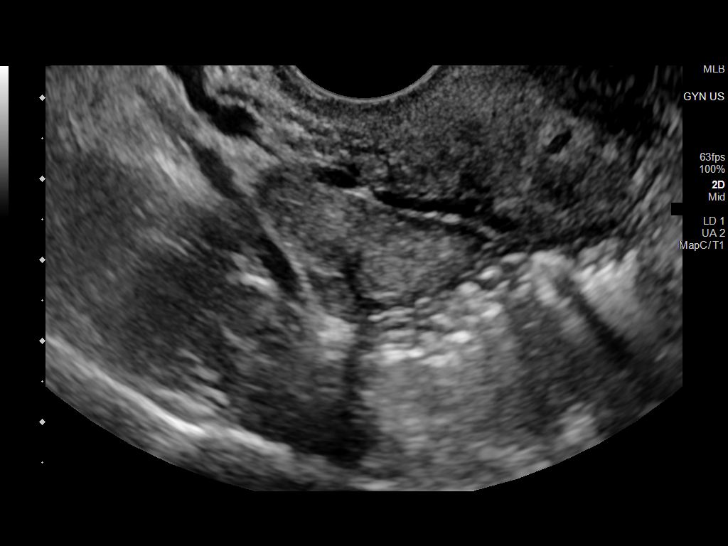
[im 82/90]
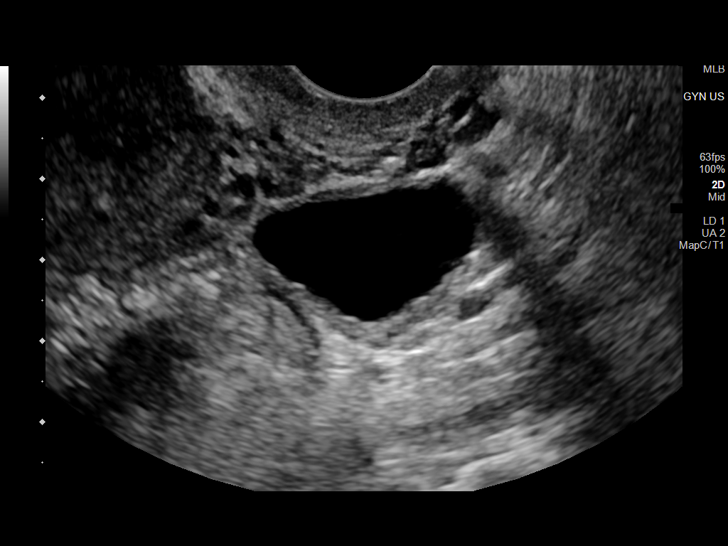
[im 90/90]
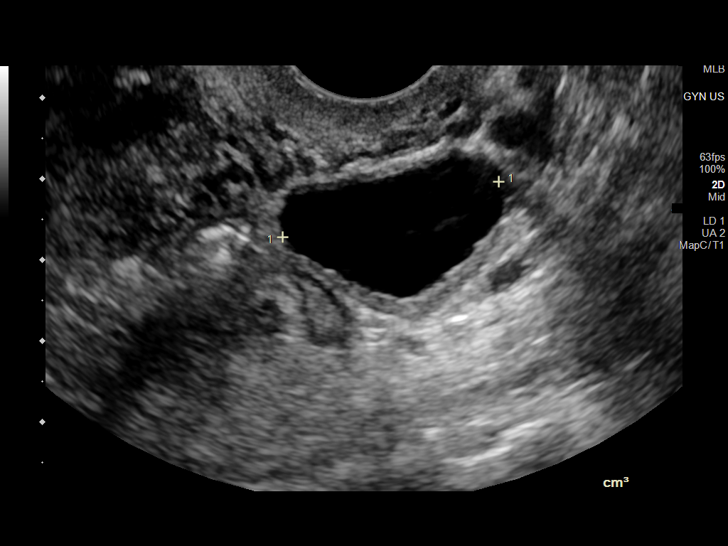

[13 of 25 positions shown; findings below may reference images not displayed]

FINDINGS: Uterus

Measurements: 6.6 x 3.1 x 4.0 cm = volume: 43.1 mL. Anteverted.
Heterogeneous myometrium. No focal mass.

Endometrium

Thickness: 4 mm. Endometrial fluid at upper uterine segment.
Additional small fluid collections at the mid uterus which could
reflect prior endometrial ablation, measure 9 mm and 9 mm in
greatest dimensions. Fluid collection at the cervical canal with a
small polyp 9 x 8 x 4 mm within the canal.

Right ovary

Measurements: 2.5 x 1.1 x 1.2 cm = volume: 1.7 mL. Normal morphology
without mass

Left ovary

Measurements: 4.6 x 2.0 x 3.2 cm = volume: 15.4 mL. Simple appearing
benign functional cyst within LEFT ovary 3.4 cm greatest diameter;
no follow-up imaging recommended.

Other findings

No free pelvic fluid.  No adnexal masses.
IMPRESSION: Simple cyst LEFT ovary 3.4 cm greatest diameter.

Small amount of nonspecific endometrial fluid and a 9 x 8 x 4 mm
diameter polyp at the cervical canal.

Suspected post endometrial ablation changes in the endometrium.

## 2021-08-21 ENCOUNTER — Telehealth: Payer: Self-pay

## 2021-08-21 NOTE — Telephone Encounter (Signed)
Patient called and left a message to see if a provider will review her u/s results with her and her next steps.   Attempted to contact patient. No answer. Left vm for patient to return call to the office

## 2021-08-24 ENCOUNTER — Telehealth: Payer: Self-pay

## 2021-08-24 NOTE — Telephone Encounter (Signed)
Patient called and left message wanting to discuss u/s results.   I returned patients call and advised that she be scheduled for a mychart visit to discuss results and to get has questions answered.  Patient states that she will call back and schedule appt.

## 2021-08-28 ENCOUNTER — Emergency Department (HOSPITAL_COMMUNITY): Payer: Medicaid Other

## 2021-08-28 ENCOUNTER — Emergency Department (HOSPITAL_COMMUNITY)
Admission: EM | Admit: 2021-08-28 | Discharge: 2021-08-29 | Disposition: A | Discharge status: Home / Self Care | Payer: Medicaid Other | Attending: Emergency Medicine | Admitting: Emergency Medicine

## 2021-08-28 ENCOUNTER — Other Ambulatory Visit: Payer: Self-pay

## 2021-08-28 ENCOUNTER — Encounter (HOSPITAL_COMMUNITY): Payer: Self-pay | Admitting: Emergency Medicine

## 2021-08-28 DIAGNOSIS — Z91010 Allergy to peanuts: Secondary | ICD-10-CM | POA: Insufficient documentation

## 2021-08-28 DIAGNOSIS — J45909 Unspecified asthma, uncomplicated: Secondary | ICD-10-CM | POA: Insufficient documentation

## 2021-08-28 DIAGNOSIS — R1031 Right lower quadrant pain: Secondary | ICD-10-CM | POA: Diagnosis present

## 2021-08-28 DIAGNOSIS — R109 Unspecified abdominal pain: Secondary | ICD-10-CM

## 2021-08-28 LAB — CBC WITH DIFFERENTIAL/PLATELET
Abs Immature Granulocytes: 0.01 10*3/uL (ref 0.00–0.07)
Basophils Absolute: 0 10*3/uL (ref 0.0–0.1)
Basophils Relative: 1 %
Eosinophils Absolute: 0.1 10*3/uL (ref 0.0–0.5)
Eosinophils Relative: 1 %
HCT: 41 % (ref 36.0–46.0)
Hemoglobin: 13.7 g/dL (ref 12.0–15.0)
Immature Granulocytes: 0 %
Lymphocytes Relative: 40 %
Lymphs Abs: 2.6 10*3/uL (ref 0.7–4.0)
MCH: 32.2 pg (ref 26.0–34.0)
MCHC: 33.4 g/dL (ref 30.0–36.0)
MCV: 96.5 fL (ref 80.0–100.0)
Monocytes Absolute: 0.6 10*3/uL (ref 0.1–1.0)
Monocytes Relative: 9 %
Neutro Abs: 3.2 10*3/uL (ref 1.7–7.7)
Neutrophils Relative %: 49 %
Platelets: 251 10*3/uL (ref 150–400)
RBC: 4.25 MIL/uL (ref 3.87–5.11)
RDW: 12.5 % (ref 11.5–15.5)
WBC: 6.5 10*3/uL (ref 4.0–10.5)
nRBC: 0 % (ref 0.0–0.2)

## 2021-08-28 LAB — COMPREHENSIVE METABOLIC PANEL
ALT: 24 U/L (ref 0–44)
AST: 24 U/L (ref 15–41)
Albumin: 3.8 g/dL (ref 3.5–5.0)
Alkaline Phosphatase: 69 U/L (ref 38–126)
Anion gap: 8 (ref 5–15)
BUN: 13 mg/dL (ref 6–20)
CO2: 29 mmol/L (ref 22–32)
Calcium: 9.3 mg/dL (ref 8.9–10.3)
Chloride: 99 mmol/L (ref 98–111)
Creatinine, Ser: 0.64 mg/dL (ref 0.44–1.00)
GFR, Estimated: >60 mL/min (ref >60)
Glucose, Bld: 85 mg/dL (ref 70–99)
Potassium: 3.7 mmol/L (ref 3.5–5.1)
Sodium: 136 mmol/L (ref 135–145)
Total Bilirubin: 0.3 mg/dL (ref 0.3–1.2)
Total Protein: 7 g/dL (ref 6.5–8.1)

## 2021-08-28 LAB — URINALYSIS, ROUTINE W REFLEX MICROSCOPIC
Bilirubin Urine: NEGATIVE
Glucose, UA: NEGATIVE mg/dL
Hgb urine dipstick: NEGATIVE
Ketones, ur: NEGATIVE mg/dL
Nitrite: NEGATIVE
Protein, ur: NEGATIVE mg/dL
Specific Gravity, Urine: 1.002 — ABNORMAL LOW (ref 1.005–1.030)
pH: 8 (ref 5.0–8.0)

## 2021-08-28 IMAGING — CT CT RENAL STONE PROTOCOL
2 of 4 series · 16 of 46 positions shown, 18 images · non-contrast
Comparison: Pelvic ultrasound [DATE].  CT [DATE]

CLINICAL DATA: Right-sided flank pain.

EXAM:
CT ABDOMEN AND PELVIS WITHOUT CONTRAST
TECHNIQUE: Multidetector CT imaging of the abdomen and pelvis was performed
following the standard protocol without IV contrast.

[Series 3: renal stone 5.0 · axial · 0.70mm/px · z∈[+1030,+1416]mm · 13 of 85 slices shown, 15 images]
[im 4/85  soft-tissue]
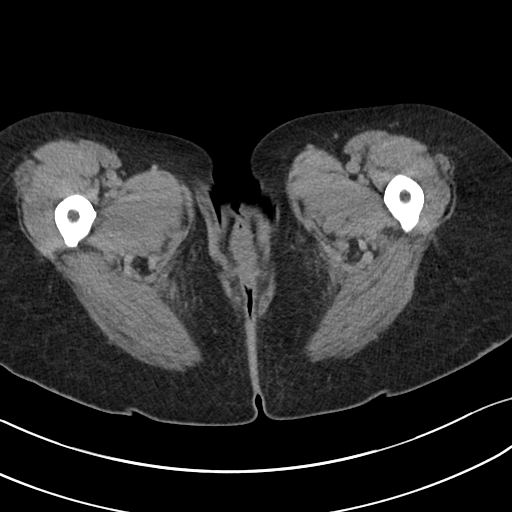
[im 4/85  bone]
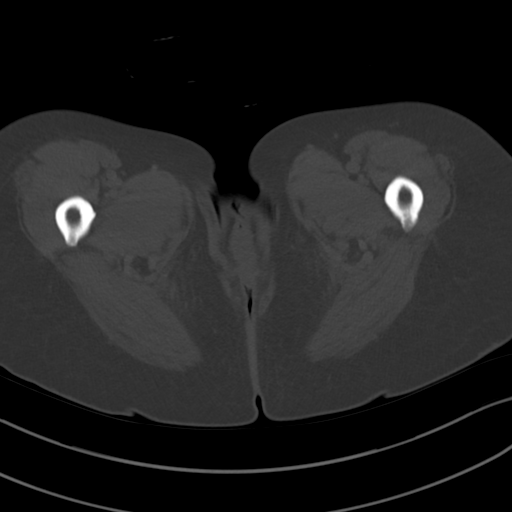
[im 11/85  soft-tissue]
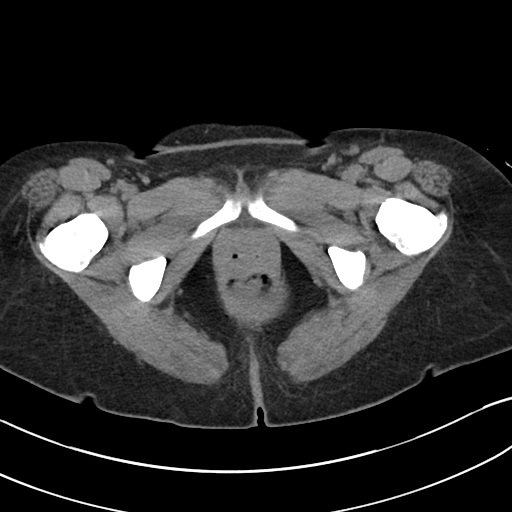
[im 18/85  soft-tissue]
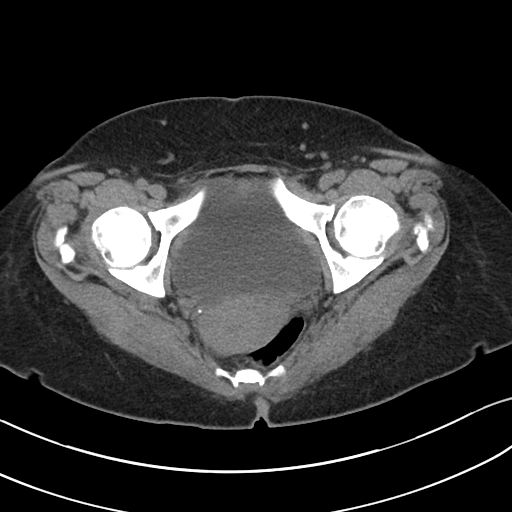
[im 25/85  soft-tissue]
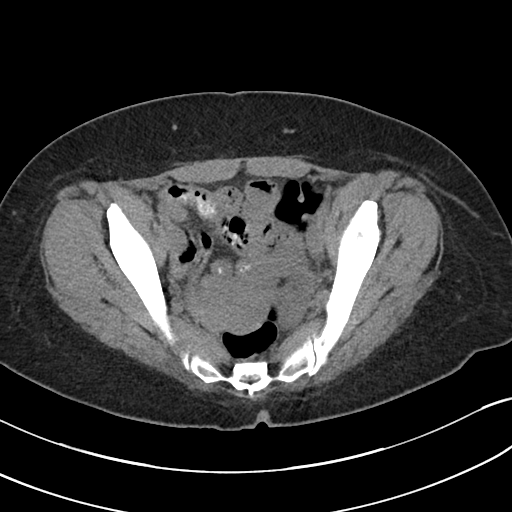
[im 29/85  soft-tissue]
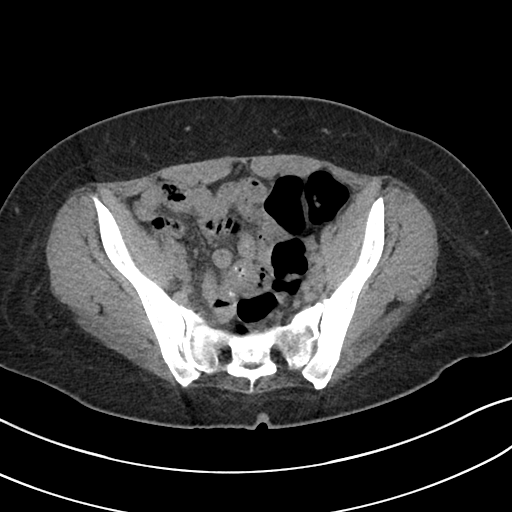
[im 36/85  soft-tissue]
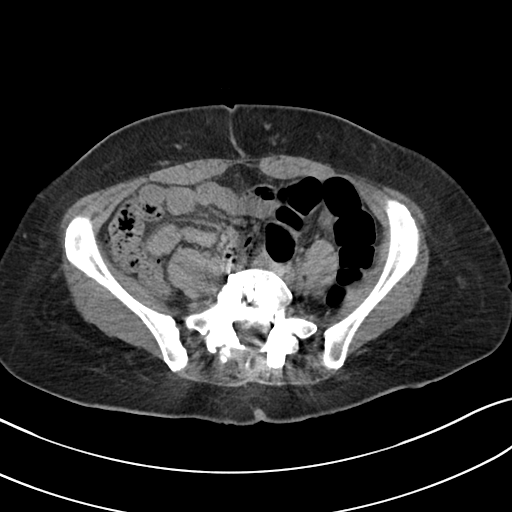
[im 43/85  soft-tissue]
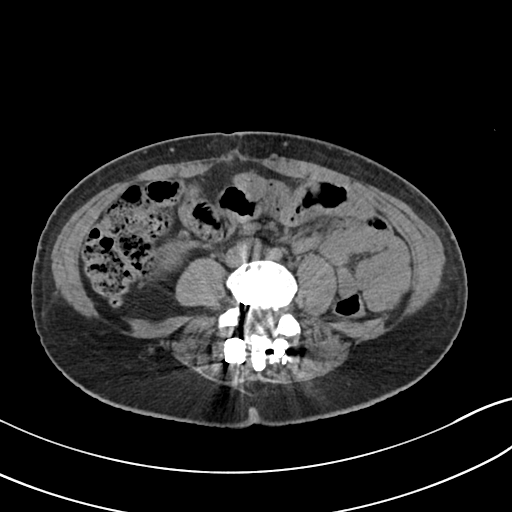
[im 50/85  soft-tissue]
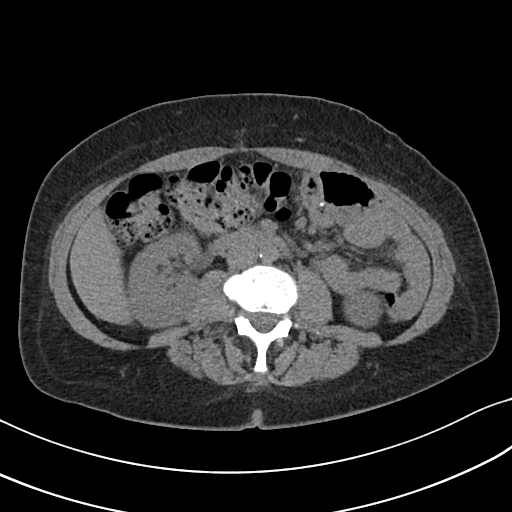
[im 57/85  soft-tissue]
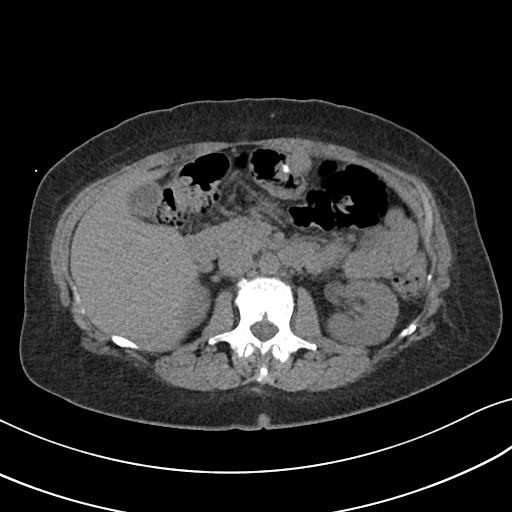
[im 57/85  bone]
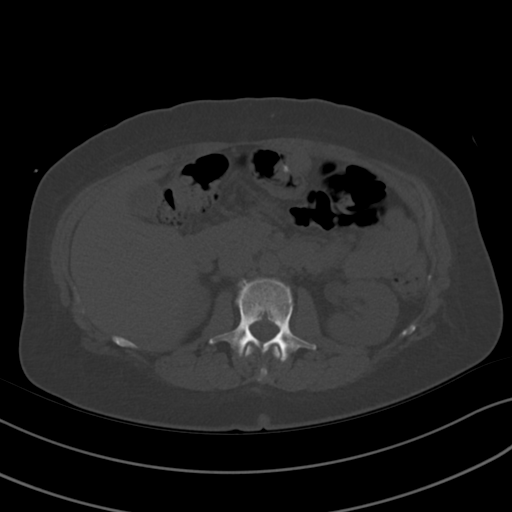
[im 60/85  soft-tissue]
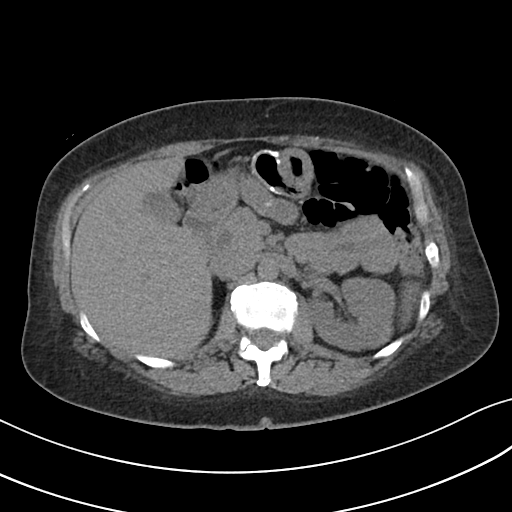
[im 67/85  soft-tissue]
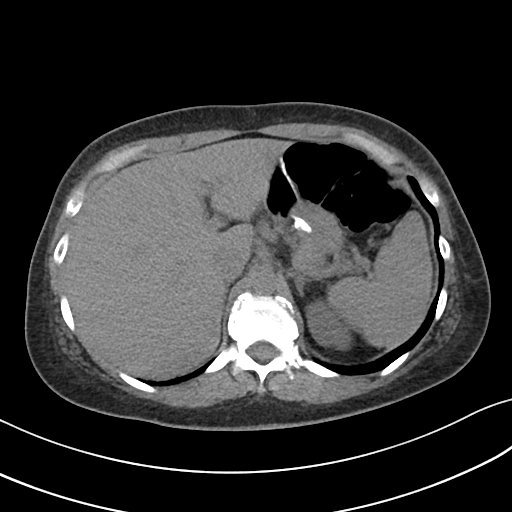
[im 74/85  soft-tissue]
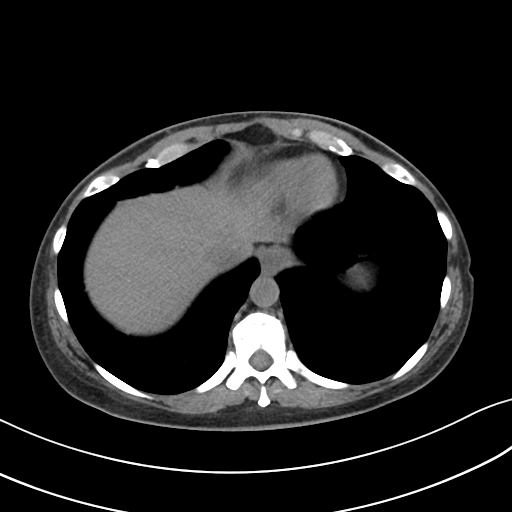
[im 81/85  soft-tissue]
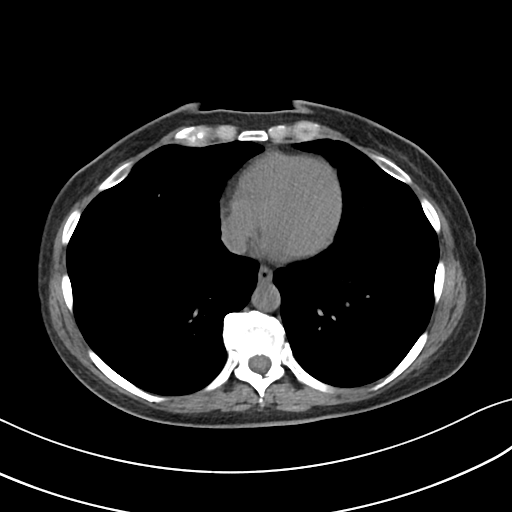

[Series 6: cor · coronal · 0.79mm/px · 3 of 147 slices shown]
[im 49/147  soft-tissue]
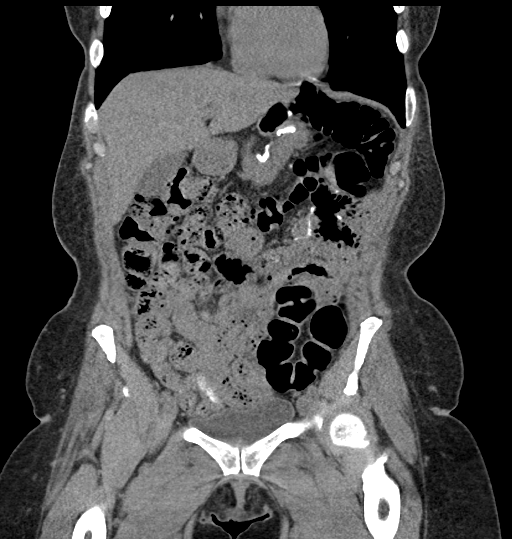
[im 65/147  soft-tissue]
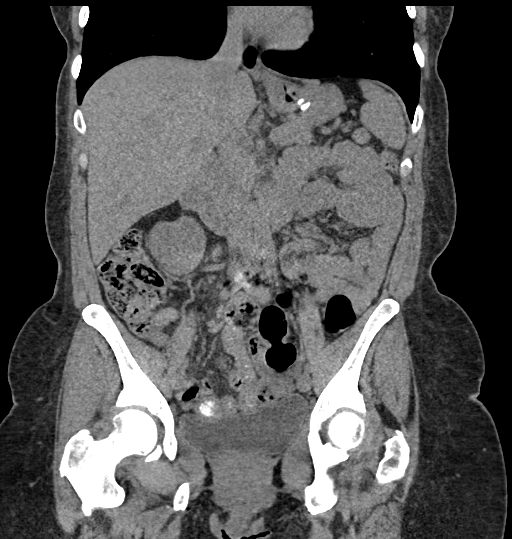
[im 82/147  soft-tissue]
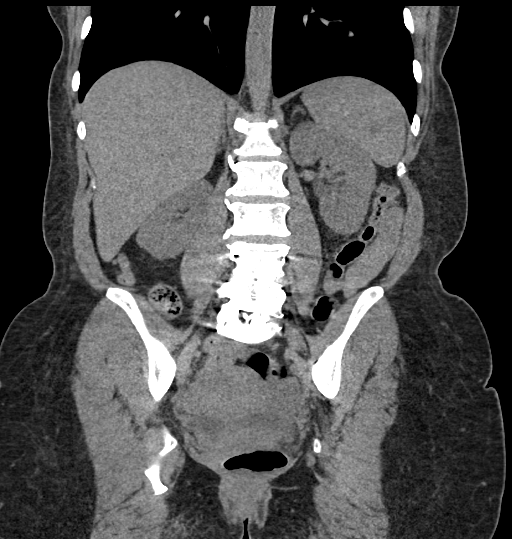

[16 of 46 positions shown; findings below may reference images not displayed]

FINDINGS: Lower chest: No acute airspace disease. No pleural fluid. Heart is
normal in size.

Hepatobiliary: No focal hepatic abnormality on this unenhanced exam.
Partially distended gallbladder. No calcified gallstone. Grossly
stable biliary dilatation allowing for lack of contrast, common bile
duct measures approximately 10 mm at the porta hepatis. This is not
significantly changed. No visualized choledocholithiasis.

Pancreas: No ductal dilatation or inflammation.

Spleen: Normal in size without focal abnormality. Splenule at the
hilum.

Adrenals/Urinary Tract: Normal adrenal glands. No hydronephrosis or
renal calculi. No perinephric edema. No focal renal lesion on this
unenhanced exam. Decompressed ureters without stones along the
course. Unremarkable urinary bladder. No bladder stone.

Stomach/Bowel: Gastric bypass anatomy. Excluded gastric remnant is
decompressed. The Roux limb is minimally patulous but nondilated.
There is no obstruction or inflammation. Normal appendix. Moderate
stool in the proximal colon with small volume of stool distally. No
colonic inflammation.

Vascular/Lymphatic: Mild aortic atherosclerosis. No aortic aneurysm.
No portal venous or mesenteric gas. No bulky abdominopelvic
adenopathy.

Reproductive: Anteverted uterus. The left ovarian cyst on prior
pelvic ultrasound is not confidently visualized on the current exam
due to adjacent bowel loops.

Other: No free air, free fluid, or intra-abdominal fluid collection.

Musculoskeletal: Posterior lumbar fusion L4-S1 with interbody
spacers. Mild degenerative change of both hips. There are no acute
or suspicious osseous abnormalities.
IMPRESSION: 1. No renal stones or obstructive uropathy. No acute abnormality in
the abdomen/pelvis.
2. Gastric bypass anatomy without complication.
3. Dilated common bile duct which is not significantly changed from
CT 1 year ago. No abnormal gallbladder distension or inflammation.
Recommend correlation with LFTs.

Aortic Atherosclerosis ([IW]-[IW]).

## 2021-08-28 MED ORDER — HYDROCODONE-ACETAMINOPHEN 5-325 MG PO TABS: 1.0000 | ORAL_TABLET | Freq: Once | ORAL | Status: DC

## 2021-08-28 NOTE — ED Provider Notes (Signed)
Emergency Medicine Provider Triage Evaluation Note  Bridget Stevens , a 50 y.o. female  was evaluated in triage.  Pt complains of an onset, constant, sharp, right flank pain radiating into right lower quadrant that began 30 minutes prior to arrival.  Patient denies any nausea, vomiting, diarrhea, urinary symptoms.  No history of kidney stones.   Review of Systems  Positive: + R flank pain Negative: - nausea, vomiting, urinary symptoms  Physical Exam  BP 131/76 (BP Location: Right Arm)   Pulse 79   Temp 98.6 F (37 C) (Oral)   Resp 16   SpO2 98%  Gen:   Awake, no distress   Resp:  Normal effort  MSK:   Moves extremities without difficulty  Other:  + right CVA TTP   Medical Decision Making  Medically screening exam initiated at 5:41 PM.  Appropriate orders placed.  Bridget Stevens was informed that the remainder of the evaluation will be completed by another provider, this initial triage assessment does not replace that evaluation, and the importance of remaining in the ED until their evaluation is complete.     Tanda Rockers, PA-C 08/28/21 1743    Ernie Avena, MD 08/28/21 [phone]

## 2021-08-28 NOTE — ED Triage Notes (Signed)
Pt c/o right sided flank pain that radiates around to her RLQ, pain started an hour pta. Denies nausea/vomiting/urinary symptoms.

## 2021-08-29 MED ORDER — SODIUM CHLORIDE 0.9 % IV SOLN
1.0000 g | Freq: Once | INTRAVENOUS | Status: AC
  Administered 2021-08-29: 1 g via INTRAVENOUS
  Filled 2021-08-29: qty 10

## 2021-08-29 MED ORDER — KETOROLAC TROMETHAMINE 15 MG/ML IJ SOLN
15.0000 mg | Freq: Once | INTRAMUSCULAR | Status: AC
  Administered 2021-08-29: 15 mg via INTRAVENOUS
  Filled 2021-08-29: qty 1

## 2021-08-29 MED ORDER — CEPHALEXIN 500 MG PO CAPS: 500.0000 mg | ORAL_CAPSULE | Freq: Four times a day (QID) | ORAL | 0 refills | Status: DC

## 2021-08-29 NOTE — ED Provider Notes (Signed)
[facility] EMERGENCY DEPARTMENT Provider Note   CSN: [id number] Arrival date & time: 08/28/21  1725     History Chief Complaint  Patient presents with   Flank Pain    Bridget Stevens is a 50 y.o. female who presents to the ED due to constant, sharp right flank pain that radiates into her RLQ. Patient states this began suddenly yesterday when she was making dinner and twisting and turning in the kitchen. Patient denies nausea, vomiting, diarrhea, blood in urine, fevers, or urinary symptoms. Patient continues that she was given a dose of antibiotics one week ago by her OBGYN for an "infection" but cannot specify what the infection or antibiotics were that she was given. On chart review it seems the patient was diagnosed with BV and given course of metronidazole. Patient has extensive history of back surgery, gastric bypass and a cyst on her left ovary.    Flank Pain Pertinent negatives include no chest pain, no abdominal pain and no shortness of breath.      Past Medical History:  Diagnosis Date   Anxiety    Arthritis    Asthma    GERD (gastroesophageal reflux disease)    Headache    Hepatomegaly    Hx of migraines    Hyperlipidemia    Ovarian cyst    Thyroid disease     Patient Active Problem List   Diagnosis Date Noted   Vaginal atrophy 10/09/2020   Pelvic pain in female 09/22/2020   Ovarian cyst 08/28/2020   S/P cervical spinal fusion 02/18/2020   S/P lumbar fusion 09/05/2019   S/P endometrial ablation - 04/2012 05/29/2012    Past Surgical History:  Procedure Laterality Date   ANTERIOR CERVICAL DECOMP/DISCECTOMY FUSION N/A 02/18/2020   Procedure: Anterior Cervical Decompression/Discectomy Fusion - Cervical five-Cervcal six;  Surgeon: Eustace Moore, MD;  Location: [facility];  Service: Neurosurgery;  Laterality: N/A;   BTL  07/30/2008   CARPAL TUNNEL RELEASE Right    CARPAL TUNNEL RELEASE Left 09/22/2016   Procedure: CARPAL TUNNEL RELEASE, left;   Surgeon: Charlotte Crumb, MD;  Location: [facility];  Service: Orthopedics;  Laterality: Left;   CESAREAN SECTION     DORSAL COMPARTMENT RELEASE Left 09/22/2016   Procedure: RELEASE DORSAL COMPARTMENT (DEQUERVAIN);  Surgeon: Charlotte Crumb, MD;  Location: [facility];  Service: Orthopedics;  Laterality: Left;   HYSTEROSCOPY W/ ENDOMETRIAL ABLATION     TUBAL LIGATION     WISDOM TOOTH EXTRACTION       OB History     Gravida  1   Para  1   Term      Preterm      AB      Living  1      SAB      IAB      Ectopic      Multiple      Live Births              Family History  Problem Relation Age of Onset   Diabetes Father    Hypertension Father    Hyperlipidemia Father    Thyroid disease Father    Asthma Father    Cancer Mother    Hypertension Mother    Thyroid disease Mother    Cancer Sister        CERVICAL   Thyroid disease Sister    Asthma Sister    Asthma Brother    Breast cancer Maternal Grandmother  in her 68s    Social History   Tobacco Use   Smoking status: Never   Smokeless tobacco: Never  Vaping Use   Vaping Use: Never used  Substance Use Topics   Alcohol use: Not Currently   Drug use: No    Home Medications Prior to Admission medications   Medication Sig Start Date End Date Taking? Authorizing Provider  acetaminophen (TYLENOL) 500 MG tablet Take 1,000 mg by mouth every 6 (six) hours as needed for moderate pain.     [provider]  albuterol (PROVENTIL HFA;VENTOLIN HFA) 108 (90 Base) MCG/ACT inhaler Inhale 2 puffs into the lungs every 4 (four) hours as needed for wheezing or shortness of breath.     [provider]  Calcium Carb-Cholecalciferol (QC CALCIUM 600 +D3 PO) Take 1 tablet by mouth daily.    [provider]  calcium elemental as carbonate (BARIATRIC TUMS ULTRA) 400 MG chewable tablet Chew 1,000-2,000 mg by mouth 3 (three) times daily as needed for heartburn (acid  reflux/indigestion.).    [provider]  Cholecalciferol (VITAMIN D) 50 MCG (2000 UT) CAPS Take 2,000 Units by mouth every evening.     [provider]  COVID-19 mRNA bivalent vaccine, Pfizer, (PFIZER COVID-19 VAC BIVALENT) injection Inject into the muscle. 08/10/21   Carlyle Basques, MD  cycloSPORINE (RESTASIS) 0.05 % ophthalmic emulsion Place 1 drop into both eyes 2 (two) times daily as needed (dry/irritated eyes.).     [provider]  EPINEPHrine 0.3 mg/0.3 mL IJ SOAJ injection Inject 0.3 mg into the muscle as needed for anaphylaxis. 11/21/19   [provider]  gabapentin (NEURONTIN) 300 MG capsule Take 300 mg by mouth in the morning, at noon, and at bedtime.    [provider]  methocarbamol (ROBAXIN) 500 MG tablet Take 500 mg by mouth in the morning and at bedtime.    [provider]  omeprazole (PRILOSEC) 20 MG capsule Take 20 mg by mouth daily.    [provider]  Pediatric Multiple Vit-C-FA (MULTIVITAMIN ANIMAL SHAPES, WITH CA/FA,) with C & FA chewable tablet Chew 1 tablet by mouth 2 (two) times daily.    [provider]  polyethylene glycol (MIRALAX / GLYCOLAX) 17 g packet Take 17 g by mouth daily as needed (constipation.).  Patient not taking: No sig reported    [provider]    Allergies    Shellfish allergy, Peanut-containing drug products, Sulfa antibiotics, Advil [ibuprofen], and Percocet [oxycodone-acetaminophen]  Review of Systems   Review of Systems  Constitutional:  Negative for fever.  Respiratory:  Negative for shortness of breath.   Cardiovascular:  Negative for chest pain.  Gastrointestinal:  Negative for abdominal pain, diarrhea, nausea and vomiting.  Genitourinary:  Positive for flank pain. Negative for decreased urine volume, difficulty urinating, dysuria, hematuria, pelvic pain and vaginal discharge.  All other systems reviewed and are negative.  Physical Exam Updated Vital Signs BP  106/71 (BP Location: Right Arm)   Pulse 63   Temp 98.3 F (36.8 C) (Oral)   Resp 17   SpO2 100%   Physical Exam Constitutional:      General: She is not in acute distress.    Appearance: Normal appearance. She is not ill-appearing.  HENT:     Head: Normocephalic.     Nose: Nose normal.  Eyes:     Extraocular Movements: Extraocular movements intact.     Pupils: Pupils are equal, round, and reactive to light.  Cardiovascular:  Rate and Rhythm: Normal rate and regular rhythm.  Pulmonary:     Effort: Pulmonary effort is normal.     Breath sounds: Normal breath sounds. No wheezing.  Abdominal:     General: Abdomen is flat.     Palpations: Abdomen is soft.     Tenderness: There is abdominal tenderness (RLQ) in the right lower quadrant. There is right CVA tenderness. There is no left CVA tenderness.  Musculoskeletal:     Cervical back: Normal range of motion.  Skin:    General: Skin is warm and dry.     Capillary Refill: Capillary refill takes less than 2 seconds.  Neurological:     General: No focal deficit present.     Mental Status: She is alert and oriented to person, place, and time.  Psychiatric:        Mood and Affect: Mood normal.    ED Results / Procedures / Treatments   Labs (all labs ordered are listed, but only abnormal results are displayed) Labs Reviewed  URINALYSIS, ROUTINE W REFLEX MICROSCOPIC - Abnormal; Notable for the following components:      Result Value   APPearance HAZY (*)    Specific Gravity, Urine 1.002 (*)    Leukocytes,Ua MODERATE (*)    Bacteria, UA RARE (*)    All other components within normal limits  URINE CULTURE  COMPREHENSIVE METABOLIC PANEL  CBC WITH DIFFERENTIAL/PLATELET    EKG None  Radiology CT Renal Stone Study  Result Date: 08/28/2021 CLINICAL DATA:  Right-sided flank pain. EXAM: CT ABDOMEN AND PELVIS WITHOUT CONTRAST TECHNIQUE: Multidetector CT imaging of the abdomen and pelvis was performed following the standard  protocol without IV contrast. COMPARISON:  Pelvic ultrasound 08/20/2021.  CT 09/15/2020 FINDINGS: Lower chest: No acute airspace disease. No pleural fluid. Heart is normal in size. Hepatobiliary: No focal hepatic abnormality on this unenhanced exam. Partially distended gallbladder. No calcified gallstone. Grossly stable biliary dilatation allowing for lack of contrast, common bile duct measures approximately 10 mm at the porta hepatis. This is not significantly changed. No visualized choledocholithiasis. Pancreas: No ductal dilatation or inflammation. Spleen: Normal in size without focal abnormality. Splenule at the hilum. Adrenals/Urinary Tract: Normal adrenal glands. No hydronephrosis or renal calculi. No perinephric edema. No focal renal lesion on this unenhanced exam. Decompressed ureters without stones along the course. Unremarkable urinary bladder. No bladder stone. Stomach/Bowel: Gastric bypass anatomy. Excluded gastric remnant is decompressed. The Roux limb is minimally patulous but nondilated. There is no obstruction or inflammation. Normal appendix. Moderate stool in the proximal colon with small volume of stool distally. No colonic inflammation. Vascular/Lymphatic: Mild aortic atherosclerosis. No aortic aneurysm. No portal venous or mesenteric gas. No bulky abdominopelvic adenopathy. Reproductive: Anteverted uterus. The left ovarian cyst on prior pelvic ultrasound is not confidently visualized on the current exam due to adjacent bowel loops. Other: No free air, free fluid, or intra-abdominal fluid collection. Musculoskeletal: Posterior lumbar fusion L4-S1 with interbody spacers. Mild degenerative change of both hips. There are no acute or suspicious osseous abnormalities. IMPRESSION: 1. No renal stones or obstructive uropathy. No acute abnormality in the abdomen/pelvis. 2. 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 3. Dilated common bile duct which is not significantly changed from CT 1 year ago. No  abnormal gallbladder distension or inflammation. Recommend correlation with LFTs. Aortic Atherosclerosis (ICD10-I70.0). Electronically Signed   By: Keith Rake M.D.   On: 08/28/2021 19:03    Procedures Procedures   Medications Ordered in ED Medications  HYDROcodone-acetaminophen (NORCO/VICODIN) 5-325 MG per  tablet 1 tablet (has no administration in time range)  cefTRIAXone (ROCEPHIN) 1 g in sodium chloride 0.9 % 100 mL IVPB (1 g Intravenous New Bag/Given 08/29/21 0821)  ketorolac (TORADOL) 15 MG/ML injection 15 mg (15 mg Intravenous Given 08/29/21 0820)    ED Course  I have reviewed the triage vital signs and the nursing notes.  Pertinent labs & imaging results that were available during my care of the patient were reviewed by me and considered in my medical decision making (see chart for details).    MDM Rules/Calculators/A&P                          50YOF presents with constant, sharp R flank pain that radiates into her RLQ. Patient states this began yesterday. DiffDx includes pyelonephritis, kidney stone, MSK pain, appendicitis. Patient CT renal stone study is negative for any findings but patient could have passed calculi already. UA significant for rare bacteria and leukocytes, but negative for nitrites. Patient urine will be cultured to assess for any bacterial growth. Patient CT renal stone study also negative for any findings of inflamed appendix and patient does not have any peritoneal signs, anorexia or fever. Patient CBC not significant for any elevated WBC count. Patient will be treated empirically for pyelonephritis due to right sided CVA tenderness. Patient will be treated with 1g of IV ceftriaxone here in the ED, given a dose of Toradol for her back discomfort and sent home on 10 day course of Keflex and will have her urine cultured. Patient care plan discussed with patient who is agreeable to plan. Patient advised to return to ED with any new or worsening symptoms.  Outpatient follow up discussed. Patient stable on discharge.   Final Clinical Impression(s) / ED Diagnoses Final diagnoses:  Right flank pain    Rx / DC Orders ED Discharge Orders     None        Al Decant, [place] 08/29/21 0930    Arby Barrette, MD 09/03/21 [phone]

## 2021-08-29 NOTE — Discharge Instructions (Addendum)
Return to ED with any new or worsening symptoms such as high fevers, worsening pain, vomiting and inability to keep down antibiotic therapy  Take antibiotic prescription for Keflex as directed Follow up with PCP in 10 days if symptoms have not resolved Take alternating Motrin (600mg ) and Tylenol (up to 1000mg ) every 6 hours as needed for pain

## 2021-08-30 LAB — URINE CULTURE

## 2021-09-08 ENCOUNTER — Other Ambulatory Visit: Payer: Self-pay

## 2021-09-08 ENCOUNTER — Ambulatory Visit: Payer: Medicaid Other | Admitting: Obstetrics & Gynecology

## 2021-09-08 VITALS — BP 108/62 | HR 73 | Ht <= 58 in | Wt 138.0 lb

## 2021-09-08 DIAGNOSIS — B379 Candidiasis, unspecified: Secondary | ICD-10-CM | POA: Diagnosis not present

## 2021-09-08 DIAGNOSIS — N939 Abnormal uterine and vaginal bleeding, unspecified: Secondary | ICD-10-CM | POA: Diagnosis not present

## 2021-09-08 DIAGNOSIS — N840 Polyp of corpus uteri: Secondary | ICD-10-CM

## 2021-09-08 MED ORDER — FLUCONAZOLE 150 MG PO TABS: 150.0000 mg | ORAL_TABLET | Freq: Once | ORAL | 0 refills | Status: AC

## 2021-09-08 NOTE — Progress Notes (Signed)
Patient ID: Bridget Stevens, female   DOB: 1971/06/10, 50 y.o.   MRN: [id number]  Chief Complaint  Patient presents with   Gynecologic Exam   F/u for [place], vaginal spotting HPI Bridget Stevens is a 50 y.o. female.  G1P1 No LMP recorded. Patient has had an ablation. She has vaginal irritation after antibiotic for UTI. NO vaginal spotting. [place] result reviewed. HPI  Past Medical History:  Diagnosis Date   Anxiety    Arthritis    Asthma    GERD (gastroesophageal reflux disease)    Headache    Hepatomegaly    Hx of migraines    Hyperlipidemia    Ovarian cyst    Thyroid disease     Past Surgical History:  Procedure Laterality Date   ANTERIOR CERVICAL DECOMP/DISCECTOMY FUSION N/A 02/18/2020   Procedure: Anterior Cervical Decompression/Discectomy Fusion - Cervical five-Cervcal six;  Surgeon: Tia Alert, MD;  Location: [facility];  Service: Neurosurgery;  Laterality: N/A;   BTL  07/30/2008   CARPAL TUNNEL RELEASE Right    CARPAL TUNNEL RELEASE Left 09/22/2016   Procedure: CARPAL TUNNEL RELEASE, left;  Surgeon: Dairl Ponder, MD;  Location: [facility];  Service: Orthopedics;  Laterality: Left;   CESAREAN SECTION     DORSAL COMPARTMENT RELEASE Left 09/22/2016   Procedure: RELEASE DORSAL COMPARTMENT (DEQUERVAIN);  Surgeon: Dairl Ponder, MD;  Location: [facility];  Service: Orthopedics;  Laterality: Left;   HYSTEROSCOPY W/ ENDOMETRIAL ABLATION     TUBAL LIGATION     WISDOM TOOTH EXTRACTION      Family History  Problem Relation Age of Onset   Diabetes Father    Hypertension Father    Hyperlipidemia Father    Thyroid disease Father    Asthma Father    Cancer Mother    Hypertension Mother    Thyroid disease Mother    Cancer Sister        CERVICAL   Thyroid disease Sister    Asthma Sister    Asthma Brother    Breast cancer Maternal Grandmother        in her 59s    Social History Social History   Tobacco Use   Smoking status: Never    Smokeless tobacco: Never  Vaping Use   Vaping Use: Never used  Substance Use Topics   Alcohol use: Not Currently   Drug use: No    Allergies  Allergen Reactions   Shellfish Allergy Anaphylaxis   Peanut-Containing Drug Products Swelling    SWELLING REACTION UNSPECIFIED    Sulfa Antibiotics Hives   Advil [Ibuprofen]     Due to gastric surgery   Percocet [Oxycodone-Acetaminophen] Other (See Comments)    PT STATES THAT SHE HAS HEADACHES WITH THIS MED    Current Outpatient Medications  Medication Sig Dispense Refill   acetaminophen (TYLENOL) 500 MG tablet Take 1,000 mg by mouth every 6 (six) hours as needed for moderate pain.      albuterol (PROVENTIL HFA;VENTOLIN HFA) 108 (90 Base) MCG/ACT inhaler Inhale 2 puffs into the lungs every 4 (four) hours as needed for wheezing or shortness of breath.      Calcium Carb-Cholecalciferol (QC CALCIUM 600 +D3 PO) Take 1 tablet by mouth daily.     calcium elemental as carbonate (BARIATRIC TUMS ULTRA) 400 MG chewable tablet Chew 1,000-2,000 mg by mouth 3 (three) times daily as needed for heartburn (acid reflux/indigestion.).     cephALEXin (KEFLEX) 500 MG capsule Take 1 capsule (500 mg total) by  mouth 4 (four) times daily. 40 capsule 0   Cholecalciferol (VITAMIN D) 50 MCG (2000 UT) CAPS Take 2,000 Units by mouth every evening.      COVID-19 mRNA bivalent vaccine, Pfizer, (PFIZER COVID-19 VAC BIVALENT) injection Inject into the muscle. 0.3 mL 0   cycloSPORINE (RESTASIS) 0.05 % ophthalmic emulsion Place 1 drop into both eyes 2 (two) times daily as needed (dry/irritated eyes.).      EPINEPHrine 0.3 mg/0.3 mL IJ SOAJ injection Inject 0.3 mg into the muscle as needed for anaphylaxis.     fluconazole (DIFLUCAN) 150 MG tablet Take 1 tablet (150 mg total) by mouth once for 1 dose. 1 tablet 0   gabapentin (NEURONTIN) 300 MG capsule Take 300 mg by mouth in the morning, at noon, and at bedtime.     methocarbamol (ROBAXIN) 500 MG tablet Take 500 mg by mouth in the  morning and at bedtime.     omeprazole (PRILOSEC) 20 MG capsule Take 20 mg by mouth daily.     Pediatric Multiple Vit-C-FA (MULTIVITAMIN ANIMAL SHAPES, WITH CA/FA,) with C & FA chewable tablet Chew 1 tablet by mouth 2 (two) times daily.     polyethylene glycol (MIRALAX / GLYCOLAX) 17 g packet Take 17 g by mouth daily as needed (constipation.).     No current facility-administered medications for this visit.    Review of Systems Review of Systems  Gastrointestinal: Negative.   Genitourinary:  Positive for vaginal discharge and vaginal pain. Negative for menstrual problem, pelvic pain and vaginal bleeding.   Blood pressure 108/62, pulse 73, height 4\' 10"  (1.473 m), weight 138 lb (62.6 kg).  Physical Exam  Physical Exam Constitutional:      Appearance: Normal appearance.  Pulmonary:     Effort: Pulmonary effort is normal.  Neurological:     General: No focal deficit present.     Mental Status: She is alert and oriented to person, place, and time.  Psychiatric:        Mood and Affect: Mood normal.        Behavior: Behavior normal.    Data Reviewed showed 9x8 mm polyp, 3 cm left ovarian benign  Assessment Spotting with endometrial polyp has resolved Vaginal yeast symptom Perimenopause, s/p ablation Plan Annual f/u but report if her VB returned. Diflucan for yeast.     [place] 09/08/2021, 10:08 AM

## 2021-11-03 NOTE — Therapy (Signed)
OUTPATIENT PHYSICAL THERAPY EVALUATION   Patient Name: Bridget Stevens MRN: [id number] DOB:1970/11/27, 51 y.o., female Today's Date: 11/04/2021   PT End of Session - 11/04/21 1219     Visit Number 1    Number of Visits 8    Date for PT Re-Evaluation 12/30/21    Authorization Type [employer]    PT Start Time 1215    PT Stop Time 1300    PT Time Calculation (min) 45 min    Activity Tolerance Patient limited by pain    Behavior During Therapy [facility] for tasks assessed/performed             Past Medical History:  Diagnosis Date   Anxiety    Arthritis    Asthma    GERD (gastroesophageal reflux disease)    Headache    Hepatomegaly    Hx of migraines    Hyperlipidemia    Ovarian cyst    Thyroid disease    Past Surgical History:  Procedure Laterality Date   ANTERIOR CERVICAL DECOMP/DISCECTOMY FUSION N/A 02/18/2020   Procedure: Anterior Cervical Decompression/Discectomy Fusion - Cervical five-Cervcal six;  Surgeon: Tia Alert, MD;  Location: [facility] OR;  Service: Neurosurgery;  Laterality: N/A;   BTL  07/30/2008   CARPAL TUNNEL RELEASE Right    CARPAL TUNNEL RELEASE Left 09/22/2016   Procedure: CARPAL TUNNEL RELEASE, left;  Surgeon: Dairl Ponder, MD;  Location: [facility];  Service: Orthopedics;  Laterality: Left;   CESAREAN SECTION     DORSAL COMPARTMENT RELEASE Left 09/22/2016   Procedure: RELEASE DORSAL COMPARTMENT (DEQUERVAIN);  Surgeon: Dairl Ponder, MD;  Location: [facility];  Service: Orthopedics;  Laterality: Left;   HYSTEROSCOPY W/ ENDOMETRIAL ABLATION     TUBAL LIGATION     WISDOM TOOTH EXTRACTION     Patient Active Problem List   Diagnosis Date Noted   Vaginal atrophy 10/09/2020   Pelvic pain in female 09/22/2020   Ovarian cyst 08/28/2020   S/P cervical spinal fusion 02/18/2020   S/P lumbar fusion 09/05/2019   S/P endometrial ablation - 04/2012 05/29/2012    PCP: Lewanda Rife, PA  REFERRING PROVIDER: Tia Alert, MD  REFERRING DIAG: Low back pain, Cervicalgia  THERAPY DIAG:  Cervicalgia  Chronic bilateral low back pain, unspecified whether sciatica present  Muscle weakness (generalized)  Other abnormalities of gait and mobility  ONSET DATE: ongoing for many years   SUBJECTIVE:             SUBJECTIVE STATEMENT: Patient has long history of low back and neck pain. She has had prior surgeries both her neck and lower back, and she tried therapy before her lower back surgery but it did not worl. Her neck started throbbing again a few months ago, and she was told her low back is bothering her because her left knee is still hurting her after that surgery causing her to walk different. Patient states the doctor wants her to have needles to see if that works because otherwise she will need to have surgery again. Patient states the neck pain is pretty constant and the low back pain comes and goes, but both can be really bad and cause her to not be able to move.   PERTINENT HISTORY:  ACDF C5-6 2021, PLIF L4-S1 2020  PAIN:  Are you having pain? Yes NPRS scale: 7-8/10 Pain location: Neck Pain orientation: Right and Left  PAIN TYPE: Chronic, constant Pain description: Sharp, throbbing Aggravating factors: Overdue it with  her arms, lifting Relieving factors: OTC medication  Are you having pain? Yes NPRS scale: 6-7/10 Pain location: Back Pain orientation: Right, Left, and Lower  PAIN TYPE: Chronic, intermittent Pain description: Sharp Aggravating factors: Do too much, walk or go up and down the steps too much Relieving factors: OTC medication, heating pad  PRECAUTIONS: None  WEIGHT BEARING RESTRICTIONS No  FALLS:  Has patient fallen in last 6 months? No  LIVING ENVIRONMENT: Lives with: lives with their family Lives in: House/apartment Stairs: Yes; patient reports she typically uses a ramp entrance Has following equipment at home: None  OCCUPATION: Disability  PLOF:  Independent  PATIENT GOALS: Pain relief and avoid surgery   OBJECTIVE:  DIAGNOSTIC FINDINGS:  X-ray  PATIENT SURVEYS:  Modified Oswestry 29/50  NDI 30/50  SCREENING FOR RED FLAGS: Negative  COGNITION: Overall cognitive status: Within functional limits for tasks assessed     SENSATION:  Light touch: Appears intact  MUSCLE LENGTH: Hamstring muscle length secondary to report of pain  POSTURE:  Rounded shoulder and forward head posture,   PALPATION: Tender to palpation bilateral  LUMBAR AROM  AROM (deg)  11/04/2021  Flexion 50%  Extension <25%  Right lateral flexion 50%  Left lateral flexion 50%  Right rotation 50%  Left rotation 50%   CERVICAL AROM  AROM (deg)  11/04/2021  Flexion 15  Extension 6  Right lateral flexion 10  Left lateral flexion 20  Right rotation 25  Left rotation 30   UE AROM:  Bilateral shoulder AROM grossly WFL  UE MMT:  MMT Right 11/04/2021 Left 11/04/2021  Shoulder flexion 4 4  Periscapular musculature 4-/5 4-/5   LE AROM/PROM:  Difficult to assess, patient with significant left knee flexion and extension range of motion limitations  LE MMT:  MMT Right 11/04/2021 Left 11/04/2021  Hip flexion 4- 4-  Hip extension 3- 3-  Hip abduction 3- 3-  Knee flexion 4 NA due to pain  Knee extension 4 NA due to pain   FUNCTIONAL TESTS:  Not assessed  GAIT: Assistive device utilized: None Level of assistance: Complete Independence Comments: Antalgic on left due to knee pain, left knee valgus, patient remains in a relatively flexed left knee position through gait, flat foot strike on left   TODAY'S TREATMENT  Supine LTR x 10 - partial range due to left knee and low back pain Supine TA activation 10 x 5 sec Seated shoulder blade squeezes 10 x 5 sec Seated chin tuck 10 x 5 sec   PATIENT EDUCATION:  Education details: Exam findings, POC, HEP, dry needling Person educated: Patient Education method: Explanation, Demonstration,  Tactile cues, Verbal cues, and Handouts Education comprehension: verbalized understanding, returned demonstration, verbal cues required, tactile cues required, and needs further education  HOME EXERCISE PROGRAM: Access Code: [email or web address]   ASSESSMENT: CLINICAL IMPRESSION: Patient is a 51 y.o. female who was seen today for physical therapy evaluation and treatment for chronic neck and lower back pain. Evaluation limited due to hight irritability of patient's symptoms and left knee limitations. She has long history of cervical and lumbar pain with previous surgeries. She seems to be pain dominant with hesitation/fear with movement due to expectation of pain onset. Her gait abnormalities due to left knee pain/structural changes are likely contributing to low back pain, but patient also with gross strength and mobility deficits. Objective impairments include Abnormal gait, decreased activity tolerance, decreased ROM, decreased strength, impaired flexibility, postural dysfunction, and pain. These impairments are limiting patient from cleaning, community activity,  driving, meal prep, occupation, laundry, yard work, and shopping. Personal factors including Fitness, Past/current experiences, and Time since onset of injury/illness/exacerbation are also affecting patient's functional outcome. Patient will benefit from skilled PT to address above impairments and improve overall function.  REHAB POTENTIAL: Fair  CLINICAL DECISION MAKING: Stable/uncomplicated  EVALUATION COMPLEXITY: Low   GOALS: Goals reviewed with patient? Yes  SHORT TERM GOALS:  STG Name Target Date Goal status  1 Patient will be I with initial HEP in order to progress with therapy. Baseline: HEP provided at eval 12/02/2021 INITIAL  2 Patient will demonstrate cervical rotation>/= 45 deg bilaterally to improve driving Baseline: 48-88 deg cervical rotation 12/02/2021 INITIAL   LONG TERM GOALS:   LTG Name Target Date Goal status  1  Patient will be I with final HEP to maintain progress from PT. Baseline: HEP provided at eval 12/30/2021 INITIAL  2 Patient will report pain no more than 5/10 for neck and lower back in order to reduce her functional limitation and avoid future surgeries. Baseline: neck pain 7-8/10, low back pain 6-7/10, both can get up to 10/10 12/30/2021 INITIAL  3 Patient will report improvement on NDI to </= 22/50 in order to indicate improvement in neck pain and functional ability with tasks related to her neck Baseline: 30/50 12/30/2021 INITIAL  4 Patient will report improvement on modified ODI to </= 17/50 in order to indicate improvement in low back pain and function Baseline: 29/50 12/30/2021 INITIAL    PLAN: PT FREQUENCY: 1x/week  PT DURATION: 8 weeks  PLANNED INTERVENTIONS: Therapeutic exercises, Therapeutic activity, Neuro Muscular re-education, Balance training, Gait training, Patient/Family education, Joint mobilization, Aquatic Therapy, Dry Needling, Spinal mobilization, Cryotherapy, Moist heat, Taping, and Manual therapy  PLAN FOR NEXT SESSION: Review HEP and progress PRN, manual/dry needling for cervical and/or lumbar regions, progress cervical and lumbar motion as tolerated, light core and postural strengthening as tolerated   Rosana Hoes, PT, DPT, LAT, ATC 11/04/21  1:32 PM Phone: [phone] Fax: [phone]   Check all possible CPT codes: 91505- Therapeutic Exercise, 97112- Neuro Re-education, [phone] - Gait Training, 97140 - Manual Therapy, 97530 - Therapeutic Activities, 97535 - Self Care, and [id number] - Aquatic therapy

## 2021-11-04 ENCOUNTER — Other Ambulatory Visit: Payer: Self-pay

## 2021-11-04 ENCOUNTER — Ambulatory Visit: Payer: Medicaid Other | Attending: Neurological Surgery | Admitting: Physical Therapy

## 2021-11-04 ENCOUNTER — Encounter: Payer: Self-pay | Admitting: Physical Therapy

## 2021-11-04 DIAGNOSIS — M6281 Muscle weakness (generalized): Secondary | ICD-10-CM

## 2021-11-04 DIAGNOSIS — R2689 Other abnormalities of gait and mobility: Secondary | ICD-10-CM

## 2021-11-04 DIAGNOSIS — M542 Cervicalgia: Secondary | ICD-10-CM | POA: Diagnosis present

## 2021-11-04 DIAGNOSIS — M545 Low back pain, unspecified: Secondary | ICD-10-CM | POA: Diagnosis present

## 2021-11-04 DIAGNOSIS — G8929 Other chronic pain: Secondary | ICD-10-CM

## 2021-11-04 NOTE — Patient Instructions (Signed)
Access Code: [email or web address] URL: [email or web address] Date: 11/04/2021 Prepared by: Rosana Hoes  Exercises Supine Transversus Abdominis Bracing - Hands on Stomach - 1-2 x daily - 7 x weekly - 10 reps - 5 seconds hold Supine Lower Trunk Rotation - 1-2 x daily - 7 x weekly - 10 reps - 5 seconds hold Seated Scapular Retraction - 1-2 x daily - 7 x weekly - 10 reps - 5 seconds hold Cervical Retraction with Overpressure - 1-2 x daily - 7 x weekly - 10 reps - 5 seconds hold

## 2021-11-10 ENCOUNTER — Ambulatory Visit: Payer: Medicaid Other | Admitting: Physical Therapy

## 2021-11-11 ENCOUNTER — Other Ambulatory Visit: Payer: Self-pay | Admitting: *Deleted

## 2021-11-11 ENCOUNTER — Telehealth: Payer: Self-pay | Admitting: *Deleted

## 2021-11-11 NOTE — Telephone Encounter (Signed)
TC from patient reporting hot flashes, lower abdominal pain. History of lower abdominal pain. Seen by Dr. Debroah Loop 08/2021 and had [place] for same. Advised making appt to be seen by Dr. Debroah Loop. Advised Tylenol and heat for abdominal pain. 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 Advised patient to seek urgent care if pain does not resolve, gets worse or if she feels her condition is urgent.

## 2021-11-18 ENCOUNTER — Encounter: Payer: Self-pay | Admitting: Obstetrics & Gynecology

## 2021-11-24 ENCOUNTER — Ambulatory Visit: Payer: Medicaid Other | Admitting: Obstetrics & Gynecology

## 2021-11-24 ENCOUNTER — Other Ambulatory Visit (HOSPITAL_COMMUNITY)
Admission: RE | Admit: 2021-11-24 | Discharge: 2021-11-24 | Disposition: A | Discharge status: Home / Self Care | Payer: Medicaid Other | Source: Ambulatory Visit | Attending: Obstetrics & Gynecology | Admitting: Obstetrics & Gynecology

## 2021-11-24 ENCOUNTER — Other Ambulatory Visit: Payer: Self-pay

## 2021-11-24 VITALS — BP 112/67 | HR 63 | Wt 135.0 lb

## 2021-11-24 DIAGNOSIS — Z9889 Other specified postprocedural states: Secondary | ICD-10-CM

## 2021-11-24 DIAGNOSIS — N952 Postmenopausal atrophic vaginitis: Secondary | ICD-10-CM | POA: Diagnosis not present

## 2021-11-24 DIAGNOSIS — R102 Pelvic and perineal pain: Secondary | ICD-10-CM | POA: Diagnosis present

## 2021-11-24 NOTE — Progress Notes (Signed)
Patient ID: Bridget Stevens, female   DOB: 08/15/1971, 51 y.o.   MRN: [id number]  Chief Complaint  Patient presents with   Hot Flashes    HPI Bridget Stevens is a 51 y.o. female.  Patient was seen last week by her PCP and some vaginal spotting was noted and she presented with abdominal-pelvic pain, which she has had for months. She notes bloating after eating wheat products and her doctor recommended wheat free diet.  HPI  Past Medical History:  Diagnosis Date   Anxiety    Arthritis    Asthma    GERD (gastroesophageal reflux disease)    Headache    Hepatomegaly    Hx of migraines    Hyperlipidemia    Ovarian cyst    Thyroid disease     Past Surgical History:  Procedure Laterality Date   ANTERIOR CERVICAL DECOMP/DISCECTOMY FUSION N/A 02/18/2020   Procedure: Anterior Cervical Decompression/Discectomy Fusion - Cervical five-Cervcal six;  Surgeon: Tia Alert, MD;  Location: [facility] OR;  Service: Neurosurgery;  Laterality: N/A;   BTL  07/30/2008   CARPAL TUNNEL RELEASE Right    CARPAL TUNNEL RELEASE Left 09/22/2016   Procedure: CARPAL TUNNEL RELEASE, left;  Surgeon: Dairl Ponder, MD;  Location: [facility];  Service: Orthopedics;  Laterality: Left;   CESAREAN SECTION     DORSAL COMPARTMENT RELEASE Left 09/22/2016   Procedure: RELEASE DORSAL COMPARTMENT (DEQUERVAIN);  Surgeon: Dairl Ponder, MD;  Location: [facility];  Service: Orthopedics;  Laterality: Left;   HYSTEROSCOPY W/ ENDOMETRIAL ABLATION     TUBAL LIGATION     WISDOM TOOTH EXTRACTION      Family History  Problem Relation Age of Onset   Diabetes Father    Hypertension Father    Hyperlipidemia Father    Thyroid disease Father    Asthma Father    Cancer Mother    Hypertension Mother    Thyroid disease Mother    Cancer Sister        CERVICAL   Thyroid disease Sister    Asthma Sister    Asthma Brother    Breast cancer Maternal Grandmother        in her 81s    Social  History Social History   Tobacco Use   Smoking status: Never   Smokeless tobacco: Never  Vaping Use   Vaping Use: Never used  Substance Use Topics   Alcohol use: Not Currently   Drug use: No    Allergies  Allergen Reactions   Shellfish Allergy Anaphylaxis   Peanut-Containing Drug Products Swelling    SWELLING REACTION UNSPECIFIED    Sulfa Antibiotics Hives   Advil [Ibuprofen]     Due to gastric surgery   Percocet [Oxycodone-Acetaminophen] Other (See Comments)    PT STATES THAT SHE HAS HEADACHES WITH THIS MED    Current Outpatient Medications  Medication Sig Dispense Refill   acetaminophen (TYLENOL) 500 MG tablet Take 1,000 mg by mouth every 6 (six) hours as needed for moderate pain.      albuterol (PROVENTIL HFA;VENTOLIN HFA) 108 (90 Base) MCG/ACT inhaler Inhale 2 puffs into the lungs every 4 (four) hours as needed for wheezing or shortness of breath.      Calcium Carb-Cholecalciferol (QC CALCIUM 600 +D3 PO) Take 1 tablet by mouth daily.     calcium elemental as carbonate (BARIATRIC TUMS ULTRA) 400 MG chewable tablet Chew 1,000-2,000 mg by mouth 3 (three) times daily as needed for heartburn (acid reflux/indigestion.).  cephALEXin (KEFLEX) 500 MG capsule Take 1 capsule (500 mg total) by mouth 4 (four) times daily. 40 capsule 0   Cholecalciferol (VITAMIN D) 50 MCG (2000 UT) CAPS Take 2,000 Units by mouth every evening.      COVID-19 mRNA bivalent vaccine, Pfizer, (PFIZER COVID-19 VAC BIVALENT) injection Inject into the muscle. 0.3 mL 0   cycloSPORINE (RESTASIS) 0.05 % ophthalmic emulsion Place 1 drop into both eyes 2 (two) times daily as needed (dry/irritated eyes.).      EPINEPHrine 0.3 mg/0.3 mL IJ SOAJ injection Inject 0.3 mg into the muscle as needed for anaphylaxis.     gabapentin (NEURONTIN) 300 MG capsule Take 300 mg by mouth in the morning, at noon, and at bedtime.     methocarbamol (ROBAXIN) 500 MG tablet Take 500 mg by mouth in the morning and at bedtime.      omeprazole (PRILOSEC) 20 MG capsule Take 20 mg by mouth daily.     Pediatric Multiple Vit-C-FA (MULTIVITAMIN ANIMAL SHAPES, WITH CA/FA,) with C & FA chewable tablet Chew 1 tablet by mouth 2 (two) times daily.     polyethylene glycol (MIRALAX / GLYCOLAX) 17 g packet Take 17 g by mouth daily as needed (constipation.).     No current facility-administered medications for this visit.    Review of Systems Review of Systems  Constitutional: Negative.   Cardiovascular: Negative.   Gastrointestinal:  Positive for abdominal distention and abdominal pain. Negative for diarrhea.  Endocrine:       VMS  Genitourinary:  Positive for pelvic pain. Negative for dysuria and menstrual problem.  Musculoskeletal:  Positive for arthralgias.   Blood pressure 112/67, pulse 63, weight 135 lb (61.2 kg).  Physical Exam Physical Exam Vitals and nursing note reviewed. Exam conducted with a chaperone present.  Constitutional:      Appearance: Normal appearance.  Pulmonary:     Effort: Pulmonary effort is normal.  Abdominal:     General: Abdomen is flat.     Palpations: Abdomen is soft.     Tenderness: There is abdominal tenderness (all quadrants). There is no guarding or rebound.  Genitourinary:    General: Normal vulva.     Vagina: Vaginal discharge and bleeding (scant spotting) present.     Cervix: Discharge present.     Uterus: Normal. Tender.      Adnexa: Right adnexa normal and left adnexa normal.  Neurological:     General: No focal deficit present.     Mental Status: She is alert.    Data Reviewed Narrative & Impression  CLINICAL DATA:  Abnormal perimenopausal bleeding, dysfunctional uterine bleeding for 1 month, prior endometrial ablation, recent procedure with gyn   EXAM: TRANSABDOMINAL AND TRANSVAGINAL ULTRASOUND OF PELVIS   TECHNIQUE: Both transabdominal and transvaginal ultrasound examinations of the pelvis were performed. Transabdominal technique was performed for global imaging  of the pelvis including uterus, ovaries, adnexal regions, and pelvic cul-de-sac. It was necessary to proceed with endovaginal exam following the transabdominal exam to visualize the endometrium, uterus, and cervix.   COMPARISON:  09/15/2020   FINDINGS: Uterus   Measurements: 6.6 x 3.1 x 4.0 cm = volume: 43.1 mL. Anteverted. Heterogeneous myometrium. No focal mass.   Endometrium   Thickness: 4 mm. Endometrial fluid at upper uterine segment. Additional small fluid collections at the mid uterus which could reflect prior endometrial ablation, measure 9 mm and 9 mm in greatest dimensions. Fluid collection at the cervical canal with a small polyp 9 x 8 x 4 mm within  the canal.   Right ovary   Measurements: 2.5 x 1.1 x 1.2 cm = volume: 1.7 mL. Normal morphology without mass   Left ovary   Measurements: 4.6 x 2.0 x 3.2 cm = volume: 15.4 mL. Simple appearing benign functional cyst within LEFT ovary 3.4 cm greatest diameter; no follow-up imaging recommended.   Other findings   No free pelvic fluid.  No adnexal masses.   IMPRESSION: Simple cyst LEFT ovary 3.4 cm greatest diameter.   Small amount of nonspecific endometrial fluid and a 9 x 8 x 4 mm diameter polyp at the cervical canal.   Suspected post endometrial ablation changes in the endometrium.     Electronically Signed   By: Ulyses Southward M.D.   On: 08/21/2021 13:49     Generalized abdominal and pelvic pain with GI sx Assessment STD testing repeated due to discharge and spotting noted  Pelvic pain in female - Plan: Cervicovaginal ancillary only( [place])  S/P endometrial ablation - 04/2012 - Plan: Cervicovaginal ancillary only( [place])   Plan Needs GI f/u as she appears to have possible IBS or gluten intolerance Labs sent today pending    Scheryl Darter 11/24/2021, 1:37 PM

## 2021-11-24 NOTE — Progress Notes (Signed)
Pt in office to follow up about pelvic pain She had a pap, lab work, STD testing and [facility] and [facility] did not find anything abnormal.  Pt had a tubal in 07/2008 Endometrial ablation in 2013 and no periods since.  Pt complain pressure and sharp pain in the pelvic region and bleeding with her lasp PAP

## 2021-11-26 LAB — CERVICOVAGINAL ANCILLARY ONLY
Bacterial Vaginitis (gardnerella): NEGATIVE
Candida Glabrata: NEGATIVE
Candida Vaginitis: NEGATIVE
Chlamydia: NEGATIVE
Comment: NEGATIVE
Comment: NEGATIVE
Comment: NEGATIVE
Comment: NEGATIVE
Comment: NEGATIVE
Comment: NORMAL
Neisseria Gonorrhea: NEGATIVE
Trichomonas: NEGATIVE

## 2021-11-27 ENCOUNTER — Emergency Department (HOSPITAL_COMMUNITY): Payer: Medicaid Other

## 2021-11-27 ENCOUNTER — Encounter (HOSPITAL_COMMUNITY): Payer: Self-pay

## 2021-11-27 ENCOUNTER — Emergency Department (HOSPITAL_COMMUNITY)
Admission: EM | Admit: 2021-11-27 | Discharge: 2021-11-27 | Disposition: A | Discharge status: Home / Self Care | Payer: Medicaid Other | Attending: Emergency Medicine | Admitting: Emergency Medicine

## 2021-11-27 ENCOUNTER — Other Ambulatory Visit: Payer: Self-pay

## 2021-11-27 DIAGNOSIS — Z91010 Allergy to peanuts: Secondary | ICD-10-CM | POA: Insufficient documentation

## 2021-11-27 DIAGNOSIS — K219 Gastro-esophageal reflux disease without esophagitis: Secondary | ICD-10-CM | POA: Insufficient documentation

## 2021-11-27 DIAGNOSIS — Z20822 Contact with and (suspected) exposure to covid-19: Secondary | ICD-10-CM | POA: Diagnosis not present

## 2021-11-27 DIAGNOSIS — R079 Chest pain, unspecified: Secondary | ICD-10-CM

## 2021-11-27 LAB — RESP PANEL BY RT-PCR (FLU A&B, COVID) ARPGX2
Influenza A by PCR: NEGATIVE
Influenza B by PCR: NEGATIVE
SARS Coronavirus 2 by RT PCR: NEGATIVE

## 2021-11-27 LAB — CBC
HCT: 44.4 % (ref 36.0–46.0)
Hemoglobin: 14.9 g/dL (ref 12.0–15.0)
MCH: 32.5 pg (ref 26.0–34.0)
MCHC: 33.6 g/dL (ref 30.0–36.0)
MCV: 96.7 fL (ref 80.0–100.0)
Platelets: 194 10*3/uL (ref 150–400)
RBC: 4.59 MIL/uL (ref 3.87–5.11)
RDW: 13.2 % (ref 11.5–15.5)
WBC: 5.2 10*3/uL (ref 4.0–10.5)
nRBC: 0 % (ref 0.0–0.2)

## 2021-11-27 LAB — TROPONIN I (HIGH SENSITIVITY)
Troponin I (High Sensitivity): <2 ng/L (ref <18)
Troponin I (High Sensitivity): <2 ng/L (ref <18)

## 2021-11-27 LAB — BASIC METABOLIC PANEL
Anion gap: 6 (ref 5–15)
BUN: 13 mg/dL (ref 6–20)
CO2: 31 mmol/L (ref 22–32)
Calcium: 9.7 mg/dL (ref 8.9–10.3)
Chloride: 102 mmol/L (ref 98–111)
Creatinine, Ser: 0.71 mg/dL (ref 0.44–1.00)
GFR, Estimated: >60 mL/min (ref >60)
Glucose, Bld: 91 mg/dL (ref 70–99)
Potassium: 3.9 mmol/L (ref 3.5–5.1)
Sodium: 139 mmol/L (ref 135–145)

## 2021-11-27 LAB — I-STAT BETA HCG BLOOD, ED (MC, WL, AP ONLY): I-stat hCG, quantitative: 5.5 m[IU]/mL — ABNORMAL HIGH (ref <5)

## 2021-11-27 IMAGING — CR DG CHEST 2V
2 series · 2 of 2 positions shown · non-contrast
Comparison: [DATE].

CLINICAL DATA: Chest pain.

EXAM:
CHEST - 2 VIEW

[w chest pa]
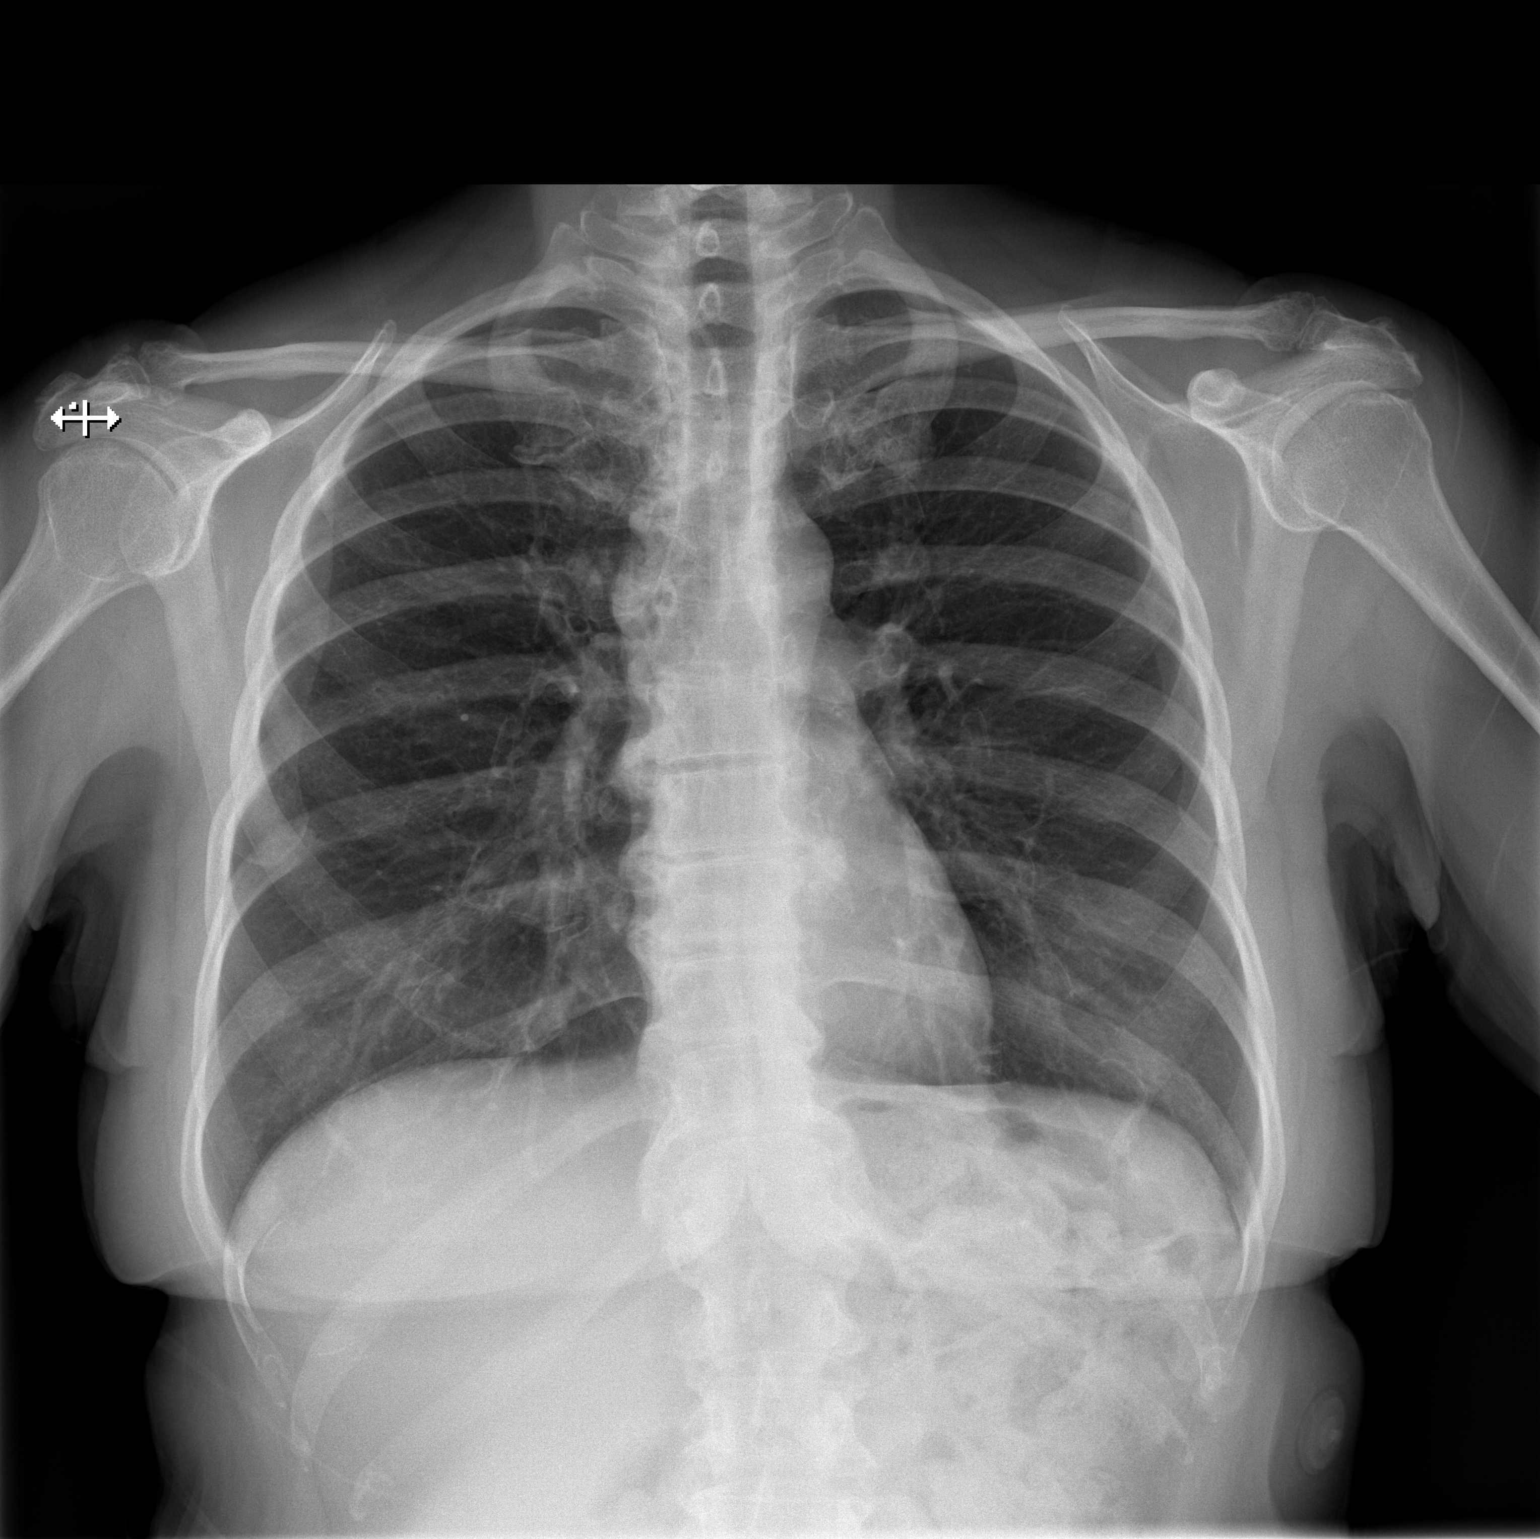

[w chest lat]
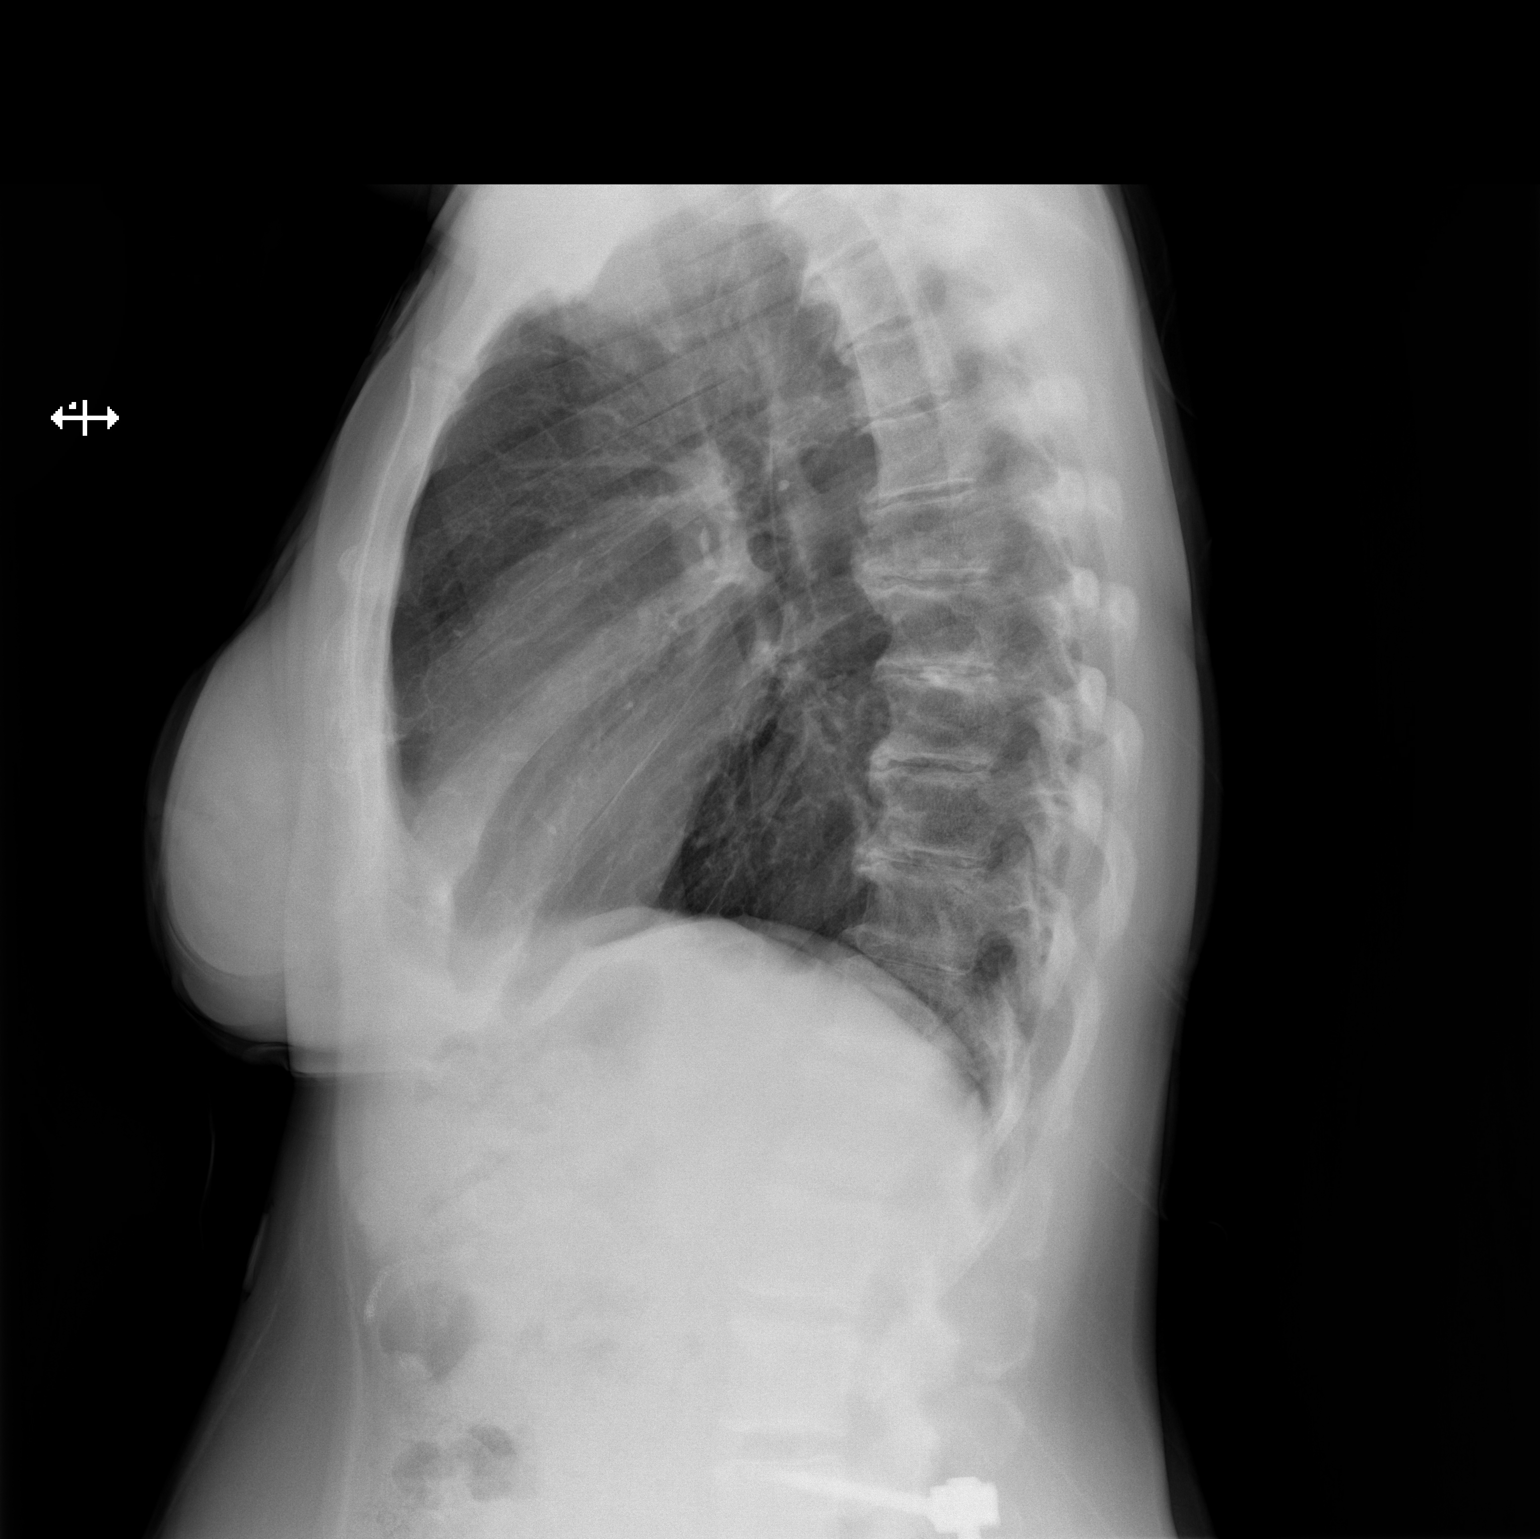

[2 of 2 positions shown; findings below may reference images not displayed]

FINDINGS: The heart size and mediastinal contours are within normal limits.
Both lungs are clear. The visualized skeletal structures are
unremarkable.
IMPRESSION: No active cardiopulmonary disease.

## 2021-11-27 MED ORDER — ALUM & MAG HYDROXIDE-SIMETH 200-200-20 MG/5ML PO SUSP
30.0000 mL | Freq: Once | ORAL | Status: AC
  Administered 2021-11-27: 30 mL via ORAL
  Filled 2021-11-27: qty 30

## 2021-11-27 MED ORDER — LIDOCAINE VISCOUS HCL 2 % MT SOLN
15.0000 mL | Freq: Once | OROMUCOSAL | Status: AC
  Administered 2021-11-27: 15 mL via ORAL
  Filled 2021-11-27: qty 15

## 2021-11-27 MED ORDER — SODIUM CHLORIDE 0.9 % IV BOLUS
1000.0000 mL | Freq: Once | INTRAVENOUS | Status: AC
  Administered 2021-11-27: 1000 mL via INTRAVENOUS

## 2021-11-27 NOTE — ED Triage Notes (Signed)
Patient c/o weakness and intermittent chest pain since yesterday. Patient states she vomited x 1 yesterday.

## 2021-11-27 NOTE — Discharge Instructions (Signed)
Make an appointment to follow-up with your primary care doctor within the next few days.  Return to the emergency may be having worsening symptoms.

## 2021-11-27 NOTE — ED Provider Notes (Signed)
[facility] DEPT Provider Note   CSN: [id number] Arrival date & time: 11/27/21  1013     History  Chief Complaint  Patient presents with   Chest Pain   Weakness    [facility] is a 51 y.o. female.  Patient is a 51 year old female who presents with chest pain and weakness.  She said she started having some pain in the center of her chest yesterday.  She also feels some reflux type symptoms.  The pain is nonradiating.  Its been fairly constant since yesterday.  No associated symptoms.  No exertional symptoms.  No pleuritic symptoms.  No associate abdominal pain.  She said she is just felt generally tired.  She had 1 episode of nausea and vomiting yesterday but has not had any since that time.  No fevers.  No cough or cold symptoms.  No dizziness.  No headache.      Home Medications Prior to Admission medications   Medication Sig Start Date End Date Taking? Authorizing Provider  acetaminophen (TYLENOL) 500 MG tablet Take 1,000 mg by mouth every 6 (six) hours as needed for moderate pain.     [provider]  albuterol (PROVENTIL HFA;VENTOLIN HFA) 108 (90 Base) MCG/ACT inhaler Inhale 2 puffs into the lungs every 4 (four) hours as needed for wheezing or shortness of breath.     [provider]  Calcium Carb-Cholecalciferol (QC CALCIUM 600 +D3 PO) Take 1 tablet by mouth daily.    [provider]  calcium elemental as carbonate (BARIATRIC TUMS ULTRA) 400 MG chewable tablet Chew 1,000-2,000 mg by mouth 3 (three) times daily as needed for heartburn (acid reflux/indigestion.).    [provider]  cephALEXin (KEFLEX) 500 MG capsule Take 1 capsule (500 mg total) by mouth 4 (four) times daily. 08/29/21   Azucena Cecil, PA-C  Cholecalciferol (VITAMIN D) 50 MCG (2000 UT) CAPS Take 2,000 Units by mouth every evening.     [provider]  COVID-19 mRNA bivalent vaccine, Pfizer, (PFIZER COVID-19 VAC BIVALENT) injection  Inject into the muscle. 08/10/21   Carlyle Basques, MD  cycloSPORINE (RESTASIS) 0.05 % ophthalmic emulsion Place 1 drop into both eyes 2 (two) times daily as needed (dry/irritated eyes.).     [provider]  EPINEPHrine 0.3 mg/0.3 mL IJ SOAJ injection Inject 0.3 mg into the muscle as needed for anaphylaxis. 11/21/19   [provider]  gabapentin (NEURONTIN) 300 MG capsule Take 300 mg by mouth in the morning, at noon, and at bedtime.    [provider]  methocarbamol (ROBAXIN) 500 MG tablet Take 500 mg by mouth in the morning and at bedtime.    [provider]  omeprazole (PRILOSEC) 20 MG capsule Take 20 mg by mouth daily.    [provider]  Pediatric Multiple Vit-C-FA (MULTIVITAMIN ANIMAL SHAPES, WITH CA/FA,) with C & FA chewable tablet Chew 1 tablet by mouth 2 (two) times daily.    [provider]  polyethylene glycol (MIRALAX / GLYCOLAX) 17 g packet Take 17 g by mouth daily as needed (constipation.).    [provider]      Allergies    Shellfish allergy, Peanut-containing drug products, Sulfa antibiotics, Advil [ibuprofen], and Percocet [oxycodone-acetaminophen]    Review of Systems   Review of Systems  Constitutional:  Positive for fatigue. Negative for chills, diaphoresis and fever.  HENT:  Negative for congestion, rhinorrhea and sneezing.   Eyes: Negative.   Respiratory:  Negative for cough, chest tightness  and shortness of breath.   Cardiovascular:  Positive for chest pain. Negative for leg swelling.  Gastrointestinal:  Negative for abdominal pain, blood in stool, diarrhea, nausea and vomiting.  Genitourinary:  Negative for difficulty urinating, flank pain, frequency and hematuria.  Musculoskeletal:  Negative for arthralgias and back pain.  Skin:  Negative for rash.  Neurological:  Negative for dizziness, speech difficulty, weakness, numbness and headaches.   Physical Exam Updated Vital Signs BP 102/71    Pulse 75     Temp 98.7 F (37.1 C) (Oral)    Resp 15    Ht 4\' 10"  (1.473 m)    Wt 59.4 kg    SpO2 100%    BMI 27.38 kg/m  Physical Exam Constitutional:      Appearance: She is well-developed.  HENT:     Head: Normocephalic and atraumatic.  Eyes:     Pupils: Pupils are equal, round, and reactive to light.  Cardiovascular:     Rate and Rhythm: Normal rate and regular rhythm.     Heart sounds: Normal heart sounds.  Pulmonary:     Effort: Pulmonary effort is normal. No respiratory distress.     Breath sounds: Normal breath sounds. No wheezing or rales.  Chest:     Chest wall: No tenderness.  Abdominal:     General: Bowel sounds are normal.     Palpations: Abdomen is soft.     Tenderness: There is no abdominal tenderness. There is no guarding or rebound.  Musculoskeletal:        General: Normal range of motion.     Cervical back: Normal range of motion and neck supple.     Comments: No edema or calf tenderness  Lymphadenopathy:     Cervical: No cervical adenopathy.  Skin:    General: Skin is warm and dry.     Findings: No rash.  Neurological:     Mental Status: She is alert and oriented to person, place, and time.    ED Results / Procedures / Treatments   Labs (all labs ordered are listed, but only abnormal results are displayed) Labs Reviewed  I-STAT BETA HCG BLOOD, ED ([facility], [facility], [facility] ONLY) - Abnormal; Notable for the following components:      Result Value   I-stat hCG, quantitative 5.5 (*)    All other components within normal limits  RESP PANEL BY RT-PCR (FLU A&B, COVID) ARPGX2  BASIC METABOLIC PANEL  CBC  TROPONIN I (HIGH SENSITIVITY)  TROPONIN I (HIGH SENSITIVITY)    EKG EKG Interpretation  Date/Time:  Friday November 27 2021 10:24:54 EST Ventricular Rate:  69 PR Interval:  160 QRS Duration: 88 QT Interval:  352 QTC Calculation: 377 R Axis:   75 Text Interpretation: Sinus rhythm Consider left atrial enlargement Low voltage, precordial leads since last tracing no  significant change Confirmed by Malvin Johns [phone]) on 11/27/2021 1:02:42 PM  Radiology DG Chest 2 View  Result Date: 11/27/2021 CLINICAL DATA:  Chest pain. EXAM: CHEST - 2 VIEW COMPARISON:  September 03, 2019. FINDINGS: The heart size and mediastinal contours are within normal limits. Both lungs are clear. The visualized skeletal structures are unremarkable. IMPRESSION: No active cardiopulmonary disease. Electronically Signed   By: Marijo Conception M.D.   On: 11/27/2021 10:54    Procedures Procedures    Medications Ordered in ED Medications  sodium chloride 0.9 % bolus 1,000 mL (1,000 mLs Intravenous New Bag/Given 11/27/21 1422)  alum & mag hydroxide-simeth (MAALOX/MYLANTA) 200-200-20 MG/5ML suspension 30 mL (  30 mLs Oral Given 11/27/21 1422)    And  lidocaine (XYLOCAINE) 2 % viscous mouth solution 15 mL (15 mLs Oral Given 11/27/21 1422)    ED Course/ Medical Decision Making/ A&P                           Medical Decision Making Amount and/or Complexity of Data Reviewed Labs: ordered. Radiology: ordered.  Risk OTC drugs. Prescription drug management.   Patient is a 51 year old female who presents to feeling fatigued.  She also has some chest pain in the center of her chest.  No exertional symptoms.  No other symptoms that sound more concerning for ACS.  Her EKG does not show any ischemic changes.  She has had 2 negative troponins.  Given her other reflux symptoms, I feel that it likely is related to that.  She was given GI cocktail with some improvement in symptoms.  She was given IV fluids and feels generally better.  She has no focal neurologic symptoms.  Her labs are reviewed by me and are nonconcerning.  Of note her i-STAT hCG came back minimally elevated.  She is status post ablation so this is most likely an erroneous value.  No anemia.  No significant electrolyte abnormalities.  She does not have any current indication for hospitalization.  Her vital signs are stable.  She was  discharged home in good condition.  She was encouraged to follow-up with her PCP.  Return precautions are given.  Final Clinical Impression(s) / ED Diagnoses Final diagnoses:  Chest pain, unspecified type  Gastroesophageal reflux disease, unspecified whether esophagitis present    Rx / DC Orders ED Discharge Orders     None         Malvin Johns, MD 11/27/21 [phone]

## 2021-12-28 NOTE — Therapy (Incomplete)
OUTPATIENT PHYSICAL THERAPY TREATMENT NOTE   Patient Name: Br[facility] MRN: [id number] DOB:[birth year], 51 y.o., female Today's Date: 12/28/2021  PCP: Karie Soda, PA REFERRING PROVIDER: Eleonore Chiquito*    Past Medical History:  Diagnosis Date   Anxiety    Arthritis    Asthma    GERD (gastroesophageal reflux disease)    Headache    Hepatomegaly    Hx of migraines    Hyperlipidemia    Ovarian cyst    Thyroid disease    Past Surgical History:  Procedure Laterality Date   ANTERIOR CERVICAL DECOMP/DISCECTOMY FUSION N/A 02/18/2020   Procedure: Anterior Cervical Decompression/Discectomy Fusion - Cervical five-Cervcal six;  Surgeon: Eustace Moore, MD;  Location: [facility];  Service: Neurosurgery;  Laterality: N/A;   BACK SURGERY     BTL  07/30/2008   CARPAL TUNNEL RELEASE Right    CARPAL TUNNEL RELEASE Left 09/22/2016   Procedure: CARPAL TUNNEL RELEASE, left;  Surgeon: Charlotte Crumb, MD;  Location: [facility];  Service: Orthopedics;  Laterality: Left;   CESAREAN SECTION     DORSAL COMPARTMENT RELEASE Left 09/22/2016   Procedure: RELEASE DORSAL COMPARTMENT (DEQUERVAIN);  Surgeon: Charlotte Crumb, MD;  Location: [facility];  Service: Orthopedics;  Laterality: Left;   ELBOW SURGERY Right    gastric by pass     HYSTEROSCOPY W/ ENDOMETRIAL ABLATION     KNEE SURGERY Left    meniscus tear and a spur   TUBAL LIGATION     WISDOM TOOTH EXTRACTION     Patient Active Problem List   Diagnosis Date Noted   Vaginal atrophy 10/09/2020   Pelvic pain in female 09/22/2020   Ovarian cyst 08/28/2020   S/P cervical spinal fusion 02/18/2020   S/P lumbar fusion 09/05/2019   S/P endometrial ablation - 04/2012 05/29/2012    REFERRING PROVIDER: Eustace Moore, MD   REFERRING DIAG: Low back pain, Cervicalgia  THERAPY DIAG:  No diagnosis found.  PERTINENT HISTORY: ACDF C5-6 2021, PLIF L4-S1 2020  PRECAUTIONS: None  SUBJECTIVE: ***  PAIN:   Are you having pain? Yes NPRS scale: 7-8/10 Pain location: Neck Pain orientation: Right and Left  PAIN TYPE: Chronic, constant Pain description: Sharp, throbbing Aggravating factors: Overdue it with her arms, lifting Relieving factors: OTC medication   Are you having pain? Yes NPRS scale: 6-7/10 Pain location: Back Pain orientation: Right, Left, and Lower  PAIN TYPE: Chronic, intermittent Pain description: Sharp Aggravating factors: Do too much, walk or go up and down the steps too much Relieving factors: OTC medication, heating pad  PATIENT GOALS: Pain relief and avoid surgery   OBJECTIVE:  PATIENT SURVEYS:  Modified Oswestry 29/50  NDI 30/50  MUSCLE LENGTH: Hamstring muscle length secondary to report of pain   POSTURE:  Rounded shoulder and forward head posture,    PALPATION: Tender to palpation bilateral   LUMBAR AROM   AROM (deg)   11/04/2021  Flexion 50%  Extension <25%  Right lateral flexion 50%  Left lateral flexion 50%  Right rotation 50%  Left rotation 50%    CERVICAL AROM   AROM (deg)   11/04/2021  Flexion 15  Extension 6  Right lateral flexion 10  Left lateral flexion 20  Right rotation 25  Left rotation 30   UE MMT:   MMT Right 11/04/2021 Left 11/04/2021  Shoulder flexion 4 4  Periscapular musculature 4-/5 4-/5    LE AROM/PROM:   Difficult to assess, patient with significant left knee flexion and  extension range of motion limitations   LE MMT:   MMT Right 11/04/2021 Left 11/04/2021  Hip flexion 4- 4-  Hip extension 3- 3-  Hip abduction 3- 3-  Knee flexion 4 NA due to pain  Knee extension 4 NA due to pain   GAIT: Assistive device utilized: None Level of assistance: Complete Independence Comments: Antalgic on left due to knee pain, left knee valgus, patient remains in a relatively flexed left knee position through gait, flat foot strike on left     TODAY'S TREATMENT  [facility] Adult PT Treatment:                                                 DATE: 12/29/2021 Therapeutic Exercise: *** Manual Therapy: ***   [facility] Adult PT Treatment:                                                DATE: 11/04/2021 Therapeutic Exercise: Supine LTR x 10 - partial range due to left knee and low back pain Supine TA activation 10 x 5 sec Seated shoulder blade squeezes 10 x 5 sec Seated chin tuck 10 x 5 sec   PATIENT EDUCATION:  Education details: HEP Person educated: Patient Education method: Consulting civil engineer, Media planner, Corporate treasurer cues, Verbal cues, and Handouts Education comprehension: verbalized understanding, returned demonstration, verbal cues required, tactile cues required, and needs further education   HOME EXERCISE PROGRAM: Access Code: [email or web address]     ASSESSMENT: CLINICAL IMPRESSION: Patient is a 51 y.o. female who was seen today for physical therapy evaluation and treatment for chronic neck and lower back pain. Evaluation limited due to hight irritability of patient's symptoms and left knee limitations. She has long history of cervical and lumbar pain with previous surgeries. She seems to be pain dominant with hesitation/fear with movement due to expectation of pain onset. Her gait abnormalities due to left knee pain/structural changes are likely contributing to low back pain, but patient also with gross strength and mobility deficits.    Objective impairments include Abnormal gait, decreased activity tolerance, decreased ROM, decreased strength, impaired flexibility, postural dysfunction, and pain.     GOALS: Goals reviewed with patient? Yes   SHORT TERM GOALS:   STG Name Target Date Goal status  1 Patient will be I with initial HEP in order to progress with therapy. Baseline: HEP provided at eval 12/02/2021 INITIAL  2 Patient will demonstrate cervical rotation>/= 45 deg bilaterally to improve driving Baseline: [id number] deg cervical rotation 12/02/2021 INITIAL    LONG TERM GOALS:    LTG Name Target Date Goal status  1  Patient will be I with final HEP to maintain progress from PT. Baseline: HEP provided at eval 12/30/2021 INITIAL  2 Patient will report pain no more than 5/10 for neck and lower back in order to reduce her functional limitation and avoid future surgeries. Baseline: neck pain 7-8/10, low back pain 6-7/10, both can get up to 10/10 12/30/2021 INITIAL  3 Patient will report improvement on NDI to </= 22/50 in order to indicate improvement in neck pain and functional ability with tasks related to her neck Baseline: 30/50 12/30/2021 INITIAL  4 Patient will report improvement on modified ODI to </= 17/50 in order to  indicate improvement in low back pain and function Baseline: 29/50 12/30/2021 INITIAL      PLAN: PT FREQUENCY: 1x/week   PT DURATION: 8 weeks   PLANNED INTERVENTIONS: Therapeutic exercises, Therapeutic activity, Neuro Muscular re-education, Balance training, Gait training, Patient/Family education, Joint mobilization, Aquatic Therapy, Dry Needling, Spinal mobilization, Cryotherapy, Moist heat, Taping, and Manual therapy   PLAN FOR NEXT SESSION: Review HEP and progress PRN, manual/dry needling for cervical and/or lumbar regions, progress cervical and lumbar motion as tolerated, light core and postural strengthening as tolerated    Bobette Mo, PT 12/28/2021, 2:06 PM

## 2021-12-29 ENCOUNTER — Encounter: Payer: Self-pay | Admitting: Physical Therapy

## 2021-12-29 ENCOUNTER — Ambulatory Visit: Payer: Medicaid Other | Attending: Neurological Surgery | Admitting: Physical Therapy

## 2021-12-29 ENCOUNTER — Other Ambulatory Visit: Payer: Self-pay

## 2021-12-29 DIAGNOSIS — G8929 Other chronic pain: Secondary | ICD-10-CM | POA: Diagnosis present

## 2021-12-29 DIAGNOSIS — M545 Low back pain, unspecified: Secondary | ICD-10-CM | POA: Diagnosis present

## 2021-12-29 DIAGNOSIS — R2689 Other abnormalities of gait and mobility: Secondary | ICD-10-CM | POA: Diagnosis present

## 2021-12-29 DIAGNOSIS — M6281 Muscle weakness (generalized): Secondary | ICD-10-CM | POA: Diagnosis present

## 2021-12-29 DIAGNOSIS — M542 Cervicalgia: Secondary | ICD-10-CM | POA: Diagnosis not present

## 2021-12-29 NOTE — Patient Instructions (Signed)
Access Code: [email or web address] ?URL: [email or web address] ?Date: 12/29/2021 ?Prepared by: Rosana Hoes ? ?Exercises ?Supine Transversus Abdominis Bracing - Hands on Stomach - 1-2 x daily - 7 x weekly - 10 reps - 5 seconds hold ?Supine Lower Trunk Rotation - 1-2 x daily - 7 x weekly - 10 reps - 5 seconds hold ?Seated Scapular Retraction - 1-2 x daily - 7 x weekly - 10 reps - 5 seconds hold ?Cervical Retraction with Overpressure - 1-2 x daily - 7 x weekly - 10 reps - 5 seconds hold ?Gentle Upper Trap Stretch - 1-2 x daily - 7 x weekly - 3 reps - 20 seconds hold ?Gentle Levator Scapulae Stretch - 1-2 x daily - 7 x weekly - 3 reps - 20 seconds hold ? ?Trigger Point Dry Needling ? ?What is Trigger Point Dry Needling (DN)? ?DN is a physical therapy technique used to treat muscle pain and dysfunction. Specifically, DN helps deactivate muscle trigger points (muscle knots).  ?A thin filiform needle is used to penetrate the skin and stimulate the underlying trigger point. The goal is for a local twitch response (LTR) to occur and for the trigger point to relax. No medication of any kind is injected during the procedure.  ? ?What Does Trigger Point Dry Needling Feel Like?  ?The procedure feels different for each individual patient. Some patients report that they do not actually feel the needle enter the skin and overall the process is not painful. Very mild bleeding may occur. However, many patients feel a deep cramping in the muscle in which the needle was inserted. This is the local twitch response.  ? ?How Will I feel after the treatment? ?Soreness is normal, and the onset of soreness may not occur for a few hours. Typically this soreness does not last longer than two days.  ?Bruising is uncommon, however; ice can be used to decrease any possible bruising.  ?In rare cases feeling tired or nauseous after the treatment is normal. In addition, your symptoms may get worse before they get better, this period will  typically not last longer than 24 hours.  ? ?What Can I do After My Treatment? ?Increase your hydration by drinking more water for the next 24 hours. ?You may place ice or heat on the areas treated that have become sore, however, do not use heat on inflamed or bruised areas. Heat often brings more relief post needling. ?You can continue your regular activities, but vigorous activity is not recommended initially after the treatment for 24 hours. ?DN is best combined with other physical therapy such as strengthening, stretching, and other therapies.   ?

## 2022-01-04 NOTE — Therapy (Signed)
?OUTPATIENT PHYSICAL THERAPY TREATMENT NOTE ? ? ?Patient Name: Bridget Stevens ?MRN: [id number] ?DOB:1971/02/10, 51 y.o., female ?Today's Date: 01/05/2022 ? ?PCP: Karie Soda, PA ?REFERRING PROVIDER: Eustace Moore, MD ? ? PT End of Session - 01/05/22 1136   ? ? Visit Number 3   ? Number of Visits 9   ? Date for PT Re-Evaluation 02/16/22   ? Authorization Type [employer]   ? Authorization Time Period 12/29/21  - 02/16/2022   ? Authorization - Visit Number 2   ? Authorization - Number of Visits 8   ? PT Start Time [id number]   ? PT Stop Time 1210   ? PT Time Calculation (min) 39 min   ? Activity Tolerance Patient tolerated treatment well   ? Behavior During Therapy [facility] for tasks assessed/performed   ? ?  ?  ? ?  ? ? ? ?Past Medical History:  ?Diagnosis Date  ? Anxiety   ? Arthritis   ? Asthma   ? GERD (gastroesophageal reflux disease)   ? Headache   ? Hepatomegaly   ? Hx of migraines   ? Hyperlipidemia   ? Ovarian cyst   ? Thyroid disease   ? ?Past Surgical History:  ?Procedure Laterality Date  ? ANTERIOR CERVICAL DECOMP/DISCECTOMY FUSION N/A 02/18/2020  ? Procedure: Anterior Cervical Decompression/Discectomy Fusion - Cervical five-Cervcal six;  Surgeon: Eustace Moore, MD;  Location: [place];  Service: Neurosurgery;  Laterality: N/A;  ? BACK SURGERY    ? BTL  07/30/2008  ? CARPAL TUNNEL RELEASE Right   ? CARPAL TUNNEL RELEASE Left 09/22/2016  ? Procedure: CARPAL TUNNEL RELEASE, left;  Surgeon: Charlotte Crumb, MD;  Location: [place];  Service: Orthopedics;  Laterality: Left;  ? CESAREAN SECTION    ? DORSAL COMPARTMENT RELEASE Left 09/22/2016  ? Procedure: RELEASE DORSAL COMPARTMENT (DEQUERVAIN);  Surgeon: Charlotte Crumb, MD;  Location: [place];  Service: Orthopedics;  Laterality: Left;  ? ELBOW SURGERY Right   ? gastric by pass    ? HYSTEROSCOPY W/ ENDOMETRIAL ABLATION    ? KNEE SURGERY Left   ? meniscus tear and a spur  ? TUBAL LIGATION    ? WISDOM TOOTH EXTRACTION    ? ?Patient  Active Problem List  ? Diagnosis Date Noted  ? Vaginal atrophy 10/09/2020  ? Pelvic pain in female 09/22/2020  ? Ovarian cyst 08/28/2020  ? S/P cervical spinal fusion 02/18/2020  ? S/P lumbar fusion 09/05/2019  ? S/P endometrial ablation - 04/2012 05/29/2012  ? ? ?REFERRING PROVIDER: Eustace Moore, MD ?  ?REFERRING DIAG: Low back pain, Cervicalgia ? ?THERAPY DIAG:  ?Cervicalgia ? ?Chronic bilateral low back pain, unspecified whether sciatica present ? ?Muscle weakness (generalized) ? ?Other abnormalities of gait and mobility ? ?PERTINENT HISTORY: ACDF C5-6 2021, PLIF L4-S1 2020 ? ?PRECAUTIONS: None ? ?SUBJECTIVE: Patient reports she has been busy the past few days. She is feeling about the same. The dry needling helped for about a day and then the pain came back. ? ?PAIN:  ?Are you having pain? Yes ?NPRS scale: 8-9/10 ?Pain location: Neck ?Pain orientation: Right and Left  ?PAIN TYPE: Chronic, constant ?Pain description: Sharp, throbbing ?Aggravating factors: Overdue it with her arms, lifting ?Relieving factors: OTC medication ?  ?Are you having pain? Yes ?NPRS scale: 6-7/10 ?Pain location: Back ?Pain orientation: Right, Left, and Lower  ?PAIN TYPE: Chronic, intermittent ?Pain description: Hervey Ard ?Aggravating factors: Do too much, walk or go up and down the steps  too much ?Relieving factors: OTC medication, heating pad ? ?PATIENT GOALS: Pain relief and avoid surgery ? ? ?OBJECTIVE: (BOLDED MEASURES ASSESSED THIS VISIT, ALL OTHERS ASSESSED AT INITIAL EVALUATION) ?PATIENT SURVEYS:  ?Modified Oswestry 20/50 - 12/29/2021 (29/50 at evalaution) ?NDI  36/50 - 12/29/2021 (30/50 at evaluation) ? ?MUSCLE LENGTH: ?Hamstring muscle length secondary to report of pain ?  ?POSTURE:  ?Rounded shoulder and forward head posture  ?  ?PALPATION: ?Tender to light palpation right > left upper trap, cervical paraspinals  ?  ?LUMBAR AROM ?  ?AROM (deg)   ?11/04/2021  ?Flexion 50%  ?Extension <25%  ?Right lateral flexion 50%  ?Left lateral  flexion 50%  ?Right rotation 50%  ?Left rotation 50%  ?  ?CERVICAL AROM ?  ?AROM (deg)   ?11/04/2021  ?12/29/2021  ?Flexion 15 15  ?Extension 6 5  ?Right lateral flexion 10 10  ?Left lateral flexion 20 20  ?Right rotation 25 25  ?Left rotation 30 30  ? ?UE MMT: ?  ?MMT Right ?11/04/2021 Left ?11/04/2021  ?Shoulder flexion 4 4  ?Periscapular musculature 4-/5 4-/5  ?  ?LE AROM/PROM: ?Difficult to assess, patient with significant left knee flexion and extension range of motion limitations ?  ?LE MMT: ?  ?MMT Right ?11/04/2021 Left ?11/04/2021  ?Hip flexion 4- 4-  ?Hip extension 3- 3-  ?Hip abduction 3- 3-  ?Knee flexion 4 NA due to pain  ?Knee extension 4 NA due to pain  ? ?GAIT: ?Assistive device utilized: None ?Level of assistance: Complete Independence ?Comments: Antalgic on left due to knee pain, left knee valgus, patient remains in a relatively flexed left knee position through gait, flat foot strike on left ?  ?  ?TODAY'S TREATMENT  ?[facility] Adult PT Treatment:                                                DATE: 01/05/2022 ?Therapeutic Exercise: ?NuStep L5 x 5 min with UE/LE while taking subjective ?Supine cervical retraction 2 x 5 with 5 sec ?Supine horizontal abduction with yellow 2 x 10 ?Seated upper trap and levator scap stretch 2 x 20 sec each ?Seated double ER and scap retraction with yellow 2 x 10 ?Seated shoulder blade squeezes 10 x 5 sec ?Row with red 2 x 10 ?Manual Therapy: ?Suboccipital release with gentle manual traction x 5 bouts ? ? ?[place] Adult PT Treatment:                                                DATE: 12/29/2021 ?Therapeutic Exercise: ?UBE L1 x 4 min (2 fwd/bwd) ?Seated upper trap and levator scap stretch 3 x 20 sec each ?Seated shoulder blade squeezes 10 x 5 sec ?Seated chin tuck 10 x 5 sec ?Review of previous HEP to ensure correct technique for home ?Manual Therapy: ?Skilled palpation and monitoring of muscle tension while performing TPDN treatment ?STM right upper trap region ?Suboccipital release  with gentle manual traction x 3 bouts ? Trigger Point Dry Needling Treatment: ?Pre-treatment instruction: Patient instructed on dry needling rationale, procedures, and possible side effects including pain during treatment (achy,cramping feeling), bruising, drop of blood, lightheadedness, nausea, sweating. ?Patient Consent Given: Yes ?Education handout provided: Yes ?Muscles treated: Right upper trap and  suboccipitals  ?Needle size and number: .30x54mm x 2 and .25x43mm x 1 ?Electrical stimulation performed: No ?Parameters: N/A ?Treatment response/outcome: Twitch response elicited and Palpable decrease in muscle tension ?Post-treatment instructions: Patient instructed to expect possible mild to moderate muscle soreness later today and/or tomorrow. Patient instructed in methods to reduce muscle soreness and to continue prescribed HEP. If patient was dry needled over the lung field, patient was instructed on signs and symptoms of pneumothorax and, however unlikely, to see immediate medical attention should they occur. Patient was also educated on signs and symptoms of infection and to seek medical attention should they occur. Patient verbalized understanding of these instructions and education. ? ?[place] Adult PT Treatment:                                                DATE: 11/04/2021 ?Therapeutic Exercise: ?Supine LTR x 10 - partial range due to left knee and low back pain ?Supine TA activation 10 x 5 sec ?Seated shoulder blade squeezes 10 x 5 sec ?Seated chin tuck 10 x 5 sec ?  ?PATIENT EDUCATION:  ?Education details: HEP update ?Person educated: Patient ?Education method: Explanation, Demonstration, Tactile cues, Verbal cues, and Handouts ?Education comprehension: verbalized understanding, returned demonstration, verbal cues required, tactile cues required, and needs further education ?  ?HOME EXERCISE PROGRAM: ?Access Code: [email or web address] ?  ?  ?ASSESSMENT: ?CLINICAL IMPRESSION: ?Patient tolerated therapy well with no  adverse effects. She denies further TPDN treatment this visit because she did not feel it benefited her after last visit. She continues to report majority of symptoms related to neck this visit. Therapy focus

## 2022-01-05 ENCOUNTER — Other Ambulatory Visit: Payer: Self-pay

## 2022-01-05 ENCOUNTER — Ambulatory Visit: Payer: Medicaid Other | Admitting: Physical Therapy

## 2022-01-05 ENCOUNTER — Encounter: Payer: Self-pay | Admitting: Physical Therapy

## 2022-01-05 DIAGNOSIS — M542 Cervicalgia: Secondary | ICD-10-CM

## 2022-01-05 DIAGNOSIS — R2689 Other abnormalities of gait and mobility: Secondary | ICD-10-CM

## 2022-01-05 DIAGNOSIS — M6281 Muscle weakness (generalized): Secondary | ICD-10-CM

## 2022-01-05 DIAGNOSIS — M545 Low back pain, unspecified: Secondary | ICD-10-CM

## 2022-01-05 NOTE — Patient Instructions (Signed)
Access Code: [email or web address] ?URL: [email or web address] ?Date: 01/05/2022 ?Prepared by: Rosana Hoes ? ?Exercises ?Supine Transversus Abdominis Bracing - Hands on Stomach - 1-2 x daily - 7 x weekly - 10 reps - 5 seconds hold ?Supine Lower Trunk Rotation - 1-2 x daily - 7 x weekly - 10 reps - 5 seconds hold ?Seated Scapular Retraction - 1-2 x daily - 7 x weekly - 10 reps - 5 seconds hold ?Cervical Retraction with Overpressure - 1-2 x daily - 7 x weekly - 10 reps - 5 seconds hold ?Gentle Upper Trap Stretch - 1-2 x daily - 7 x weekly - 3 reps - 20 seconds hold ?Gentle Levator Scapulae Stretch - 1-2 x daily - 7 x weekly - 3 reps - 20 seconds hold ?Standing Row with Anchored Resistance - 1-2 x daily - 7 x weekly - 2 sets - 10 reps ? ?

## 2022-01-11 NOTE — Therapy (Signed)
?OUTPATIENT PHYSICAL THERAPY TREATMENT NOTE ? ? ?Patient Name: Bridget Stevens ?MRN: [id number] ?DOB:05/31/1971, 51 y.o., female ?Today's Date: 01/12/2022 ? ?PCP: Karie Soda, PA ?REFERRING PROVIDER: Eustace Moore, MD ? ? PT End of Session - 01/12/22 1131   ? ? Visit Number 4   ? Number of Visits 9   ? Date for PT Re-Evaluation 02/16/22   ? Authorization Type [employer]   ? Authorization Time Period 12/29/21  - 02/16/2022   ? Authorization - Visit Number 3   ? Authorization - Number of Visits 8   ? PT Start Time 1130   ? PT Stop Time 1210   ? PT Time Calculation (min) 40 min   ? Activity Tolerance Patient tolerated treatment well   ? Behavior During Therapy [facility] for tasks assessed/performed   ? ?  ?  ? ?  ? ? ? ? ?Past Medical History:  ?Diagnosis Date  ? Anxiety   ? Arthritis   ? Asthma   ? GERD (gastroesophageal reflux disease)   ? Headache   ? Hepatomegaly   ? Hx of migraines   ? Hyperlipidemia   ? Ovarian cyst   ? Thyroid disease   ? ?Past Surgical History:  ?Procedure Laterality Date  ? ANTERIOR CERVICAL DECOMP/DISCECTOMY FUSION N/A 02/18/2020  ? Procedure: Anterior Cervical Decompression/Discectomy Fusion - Cervical five-Cervcal six;  Surgeon: Eustace Moore, MD;  Location: [place];  Service: Neurosurgery;  Laterality: N/A;  ? BACK SURGERY    ? BTL  07/30/2008  ? CARPAL TUNNEL RELEASE Right   ? CARPAL TUNNEL RELEASE Left 09/22/2016  ? Procedure: CARPAL TUNNEL RELEASE, left;  Surgeon: Charlotte Crumb, MD;  Location: [place];  Service: Orthopedics;  Laterality: Left;  ? CESAREAN SECTION    ? DORSAL COMPARTMENT RELEASE Left 09/22/2016  ? Procedure: RELEASE DORSAL COMPARTMENT (DEQUERVAIN);  Surgeon: Charlotte Crumb, MD;  Location: [place];  Service: Orthopedics;  Laterality: Left;  ? ELBOW SURGERY Right   ? gastric by pass    ? HYSTEROSCOPY W/ ENDOMETRIAL ABLATION    ? KNEE SURGERY Left   ? meniscus tear and a spur  ? TUBAL LIGATION    ? WISDOM TOOTH EXTRACTION    ? ?Patient  Active Problem List  ? Diagnosis Date Noted  ? Vaginal atrophy 10/09/2020  ? Pelvic pain in female 09/22/2020  ? Ovarian cyst 08/28/2020  ? S/P cervical spinal fusion 02/18/2020  ? S/P lumbar fusion 09/05/2019  ? S/P endometrial ablation - 04/2012 05/29/2012  ? ? ?REFERRING PROVIDER: Eustace Moore, MD ?  ?REFERRING DIAG: Low back pain, Cervicalgia ? ?THERAPY DIAG:  ?Cervicalgia ? ?Chronic bilateral low back pain, unspecified whether sciatica present ? ?Muscle weakness (generalized) ? ?Other abnormalities of gait and mobility ? ?PERTINENT HISTORY: ACDF C5-6 2021, PLIF L4-S1 2020 ? ?PRECAUTIONS: None ? ?SUBJECTIVE: Patient reports she has been busy the past few days. She is feeling about the same. The dry needling helped for about a day and then the pain came back. ? ?PAIN:  ?Are you having pain? Yes ?NPRS scale: 8-9/10 ?Pain location: Neck ?Pain orientation: Right and Left  ?PAIN TYPE: Chronic, constant ?Pain description: Sharp, throbbing ?Aggravating factors: Overdue it with her arms, lifting ?Relieving factors: OTC medication ?  ?Are you having pain? Yes ?NPRS scale: 6-7/10 ?Pain location: Back ?Pain orientation: Right, Left, and Lower  ?PAIN TYPE: Chronic, intermittent ?Pain description: Hervey Ard ?Aggravating factors: Do too much, walk or go up and down the  steps too much ?Relieving factors: OTC medication, heating pad ? ?PATIENT GOALS: Pain relief and avoid surgery ? ? ?OBJECTIVE:  ?PATIENT SURVEYS:  ?Modified Oswestry 20/50 - 12/29/2021 (29/50 at evalaution) ?NDI  36/50 - 12/29/2021 (30/50 at evaluation) ? ?MUSCLE LENGTH: ?Hamstring muscle length secondary to report of pain ?  ?POSTURE:  ?Rounded shoulder and forward head posture  ?  ?PALPATION: ?Tender to light palpation right > left upper trap, cervical paraspinals  ?  ?LUMBAR AROM ?  ?AROM (deg)   ?11/04/2021  ?Flexion 50%  ?Extension <25%  ?Right lateral flexion 50%  ?Left lateral flexion 50%  ?Right rotation 50%  ?Left rotation 50%  ?  ?CERVICAL AROM ?  ?AROM  (deg)   ?11/04/2021  ?12/29/2021  ?01/12/2022  ?Flexion 15 15   ?Extension 6 5   ?Right lateral flexion 10 10 15   ?Left lateral flexion 20 20 20   ?Right rotation 25 25 25   ?Left rotation 30 30 30   ? ?UE MMT: ?  ?MMT Right ?11/04/2021 Left ?11/04/2021  ?Shoulder flexion 4 4  ?Periscapular musculature 4-/5 4-/5  ?  ?LE AROM/PROM: ?Difficult to assess, patient with significant left knee flexion and extension range of motion limitations ?  ?LE MMT: ?  ?MMT Right ?11/04/2021 Left ?11/04/2021  ?Hip flexion 4- 4-  ?Hip extension 3- 3-  ?Hip abduction 3- 3-  ?Knee flexion 4 NA due to pain  ?Knee extension 4 NA due to pain  ? ?GAIT: ?Assistive device utilized: None ?Level of assistance: Complete Independence ?Comments: Antalgic on left due to knee pain, left knee valgus, patient remains in a relatively flexed left knee position through gait, flat foot strike on left ?  ?  ?TODAY'S TREATMENT  ?[facility] Adult PT Treatment:                                                DATE: 01/12/2022 ?Therapeutic Exercise: ?NuStep L5 x 5 min with UE/LE while taking subjective ?Sidelying thoracic rotation x 10 each ?Supine cervical retraction x 10 with 5 sec ?Supine horizontal abduction with red 2 x 10 ?Supine serratus press with 4# 2 x 10 ?Seated double ER and scap retraction with red 2 x 10 ?Row with red 2 x 10 ?Extension with red 2 x 10 ?Manual Therapy: ?Suboccipital release with gentle manual traction x 5 bouts ?Passive upper trap and levator scap stretch ? ? ?[facility] Adult PT Treatment:                                                DATE: 01/05/2022 ?Therapeutic Exercise: ?NuStep L5 x 5 min with UE/LE while taking subjective ?Supine cervical retraction 2 x 5 with 5 sec ?Supine horizontal abduction with yellow 2 x 10 ?Seated upper trap and levator scap stretch 2 x 20 sec each ?Seated double ER and scap retraction with yellow 2 x 10 ?Seated shoulder blade squeezes 10 x 5 sec ?Row with red 2 x 10 ?Manual Therapy: ?Suboccipital release with gentle manual  traction x 5 bouts ? ?[facility] Adult PT Treatment:  DATE: 12/29/2021 ?Therapeutic Exercise: ?UBE L1 x 4 min (2 fwd/bwd) ?Seated upper trap and levator scap stretch 3 x 20 sec each ?Seated shoulder blade squeezes 10 x 5 sec ?Seated chin tuck 10 x 5 sec ?Review of previous HEP to ensure correct technique for home ?Manual Therapy: ?Skilled palpation and monitoring of muscle tension while performing TPDN treatment ?STM right upper trap region ?Suboccipital release with gentle manual traction x 3 bouts ? Trigger Point Dry Needling Treatment: ?Pre-treatment instruction: Patient instructed on dry needling rationale, procedures, and possible side effects including pain during treatment (achy,cramping feeling), bruising, drop of blood, lightheadedness, nausea, sweating. ?Patient Consent Given: Yes ?Education handout provided: Yes ?Muscles treated: Right upper trap and suboccipitals  ?Needle size and number: .30x58mm x 2 and .25x4mm x 1 ?Electrical stimulation performed: No ?Parameters: N/A ?Treatment response/outcome: Twitch response elicited and Palpable decrease in muscle tension ?Post-treatment instructions: Patient instructed to expect possible mild to moderate muscle soreness later today and/or tomorrow. Patient instructed in methods to reduce muscle soreness and to continue prescribed HEP. If patient was dry needled over the lung field, patient was instructed on signs and symptoms of pneumothorax and, however unlikely, to see immediate medical attention should they occur. Patient was also educated on signs and symptoms of infection and to seek medical attention should they occur. Patient verbalized understanding of these instructions and education. ? ?[place] Adult PT Treatment:                                                DATE: 11/04/2021 ?Therapeutic Exercise: ?Supine LTR x 10 - partial range due to left knee and low back pain ?Supine TA activation 10 x 5 sec ?Seated shoulder  blade squeezes 10 x 5 sec ?Seated chin tuck 10 x 5 sec ?  ?PATIENT EDUCATION:  ?Education details: HEP ?Person educated: Patient ?Education method: Explanation, Demonstration, Tactile cues, Verbal cues ?Ed

## 2022-01-12 ENCOUNTER — Other Ambulatory Visit: Payer: Self-pay

## 2022-01-12 ENCOUNTER — Encounter: Payer: Self-pay | Admitting: Physical Therapy

## 2022-01-12 ENCOUNTER — Ambulatory Visit: Payer: Medicaid Other | Admitting: Physical Therapy

## 2022-01-12 DIAGNOSIS — M542 Cervicalgia: Secondary | ICD-10-CM

## 2022-01-12 DIAGNOSIS — R2689 Other abnormalities of gait and mobility: Secondary | ICD-10-CM

## 2022-01-12 DIAGNOSIS — M545 Low back pain, unspecified: Secondary | ICD-10-CM

## 2022-01-12 DIAGNOSIS — M6281 Muscle weakness (generalized): Secondary | ICD-10-CM

## 2022-01-18 NOTE — Therapy (Addendum)
?OUTPATIENT PHYSICAL THERAPY TREATMENT NOTE ? ?DISCHARGE ? ? ?Patient Name: Bridget Stevens ?MRN: [id number] ?DOB:[birth year], 51 y.o., female ?Today's Date: 01/19/2022 ? ?PCP: Karie Soda, PA ?REFERRING PROVIDER: Eustace Moore, MD ? ? PT End of Session - 01/19/22 1020   ? ? Visit Number 5   ? Number of Visits 9   ? Date for PT Re-Evaluation 02/16/22   ? Authorization Type [employer]   ? Authorization Time Period 12/29/21  - 02/16/2022   ? Authorization - Visit Number 4   ? Authorization - Number of Visits 8   ? PT Start Time 1001   ? PT Stop Time 1043   ? PT Time Calculation (min) 42 min   ? Activity Tolerance Patient tolerated treatment well   ? Behavior During Therapy [facility] for tasks assessed/performed   ? ?  ?  ? ?  ? ? ? ? ? ?Past Medical History:  ?Diagnosis Date  ? Anxiety   ? Arthritis   ? Asthma   ? GERD (gastroesophageal reflux disease)   ? Headache   ? Hepatomegaly   ? Hx of migraines   ? Hyperlipidemia   ? Ovarian cyst   ? Thyroid disease   ? ?Past Surgical History:  ?Procedure Laterality Date  ? ANTERIOR CERVICAL DECOMP/DISCECTOMY FUSION N/A 02/18/2020  ? Procedure: Anterior Cervical Decompression/Discectomy Fusion - Cervical five-Cervcal six;  Surgeon: Eustace Moore, MD;  Location: [place];  Service: Neurosurgery;  Laterality: N/A;  ? BACK SURGERY    ? BTL  07/30/2008  ? CARPAL TUNNEL RELEASE Right   ? CARPAL TUNNEL RELEASE Left 09/22/2016  ? Procedure: CARPAL TUNNEL RELEASE, left;  Surgeon: Charlotte Crumb, MD;  Location: [place];  Service: Orthopedics;  Laterality: Left;  ? CESAREAN SECTION    ? DORSAL COMPARTMENT RELEASE Left 09/22/2016  ? Procedure: RELEASE DORSAL COMPARTMENT (DEQUERVAIN);  Surgeon: Charlotte Crumb, MD;  Location: [place];  Service: Orthopedics;  Laterality: Left;  ? ELBOW SURGERY Right   ? gastric by pass    ? HYSTEROSCOPY W/ ENDOMETRIAL ABLATION    ? KNEE SURGERY Left   ? meniscus tear and a spur  ? TUBAL LIGATION    ? WISDOM TOOTH EXTRACTION     ? ?Patient Active Problem List  ? Diagnosis Date Noted  ? Vaginal atrophy 10/09/2020  ? Pelvic pain in female 09/22/2020  ? Ovarian cyst 08/28/2020  ? S/P cervical spinal fusion 02/18/2020  ? S/P lumbar fusion 09/05/2019  ? S/P endometrial ablation - 04/2012 05/29/2012  ? ? ?REFERRING PROVIDER: Eustace Moore, MD ?  ?REFERRING DIAG: Low back pain, Cervicalgia ? ?THERAPY DIAG:  ?Cervicalgia ? ?Chronic bilateral low back pain, unspecified whether sciatica present ? ?Muscle weakness (generalized) ? ?Other abnormalities of gait and mobility ? ?PERTINENT HISTORY: ACDF C5-6 2021, PLIF L4-S1 2020 ? ?PRECAUTIONS: None ? ?SUBJECTIVE: Patient reports she is doing about the same. Her pain really never changes, she continues to have high pain levels and feels she needs an MRI to see what is going on that is causing her pain. ? ?PAIN:  ?Are you having pain? Yes ?NPRS scale: 7-8/10 ?Pain location: Neck ?Pain orientation: Right and Left  ?PAIN TYPE: Chronic, constant ?Pain description: Sharp, throbbing ?Aggravating factors: Overdue it with her arms, lifting ?Relieving factors: OTC medication ?  ?Are you having pain? Yes ?NPRS scale: 6-7/10 ?Pain location: Back ?Pain orientation: Right, Left, and Lower  ?PAIN TYPE: Chronic, intermittent ?Pain description: [place] Ar[place] ?Aggravating factors:  Do too much, walk or go up and down the steps too much ?Relieving factors: OTC medication, heating pad ? ?PATIENT GOALS: Pain relief and avoid surgery ? ? ?OBJECTIVE:  ?PATIENT SURVEYS:  ?Modified Oswestry 20/50 - 01/19/2022 (20/50 - 12/29/2021; 29/50 at evalaution) ?NDI  27/50 - 01/19/2022 (36/50 - 12/29/2021; 30/50 at evaluation) ? ?POSTURE:  ?Rounded shoulder and forward head posture  ?  ?PALPATION: ?Tender to light palpation right > left upper trap, cervical paraspinals  ?  ?LUMBAR AROM ?  ?AROM (deg)   ?11/04/2021  ?01/19/2022  ?Flexion 50% 75%  ?Extension <25% 25%  ?Right lateral flexion 50% 50%  ?Left lateral flexion 50% 50%  ?Right rotation 50% 75%   ?Left rotation 50% 75%  ?  ?CERVICAL AROM ?  ?AROM (deg)   ?11/04/2021  ?12/29/2021  ?01/12/2022  ?01/19/2022  ?Flexion 15 15  40  ?Extension $RemoveBef'6 5  20  'YFmWOMMech$ ?Right lateral flexion $RemoveBeforeD'10 10 15 25  'nriWtxcsahHjKx$ ?Left lateral flexion $RemoveBeforeD'20 20 20 20  'KkBxuQxlEecCHb$ ?Right rotation $RemoveBeforeD'25 25 25 'vemDJLFuWMlQtS$ 40  ?Left rotation $RemoveBefore'30 30 30 'xrJoLlISiBRcG$ 35  ? ?UE MMT: ?  ?MMT Right ?11/04/2021 Left ?11/04/2021 Rt / Lt ?01/19/2022  ?Shoulder flexion $RemoveBeforeDEI'4 4 4 'RQBLhdMzUqjbPXOm$ / 4  ?Periscapular musculature 4-/5 4-/5 4- / 4-  ?  ?LE MMT: ?  ?MMT Right ?11/04/2021 Left ?11/04/2021 Rt / Lt ?01/19/2022  ?Hip flexion 4- 4-   ?Hip extension 3- 3-   ?Hip abduction 3- 3-   ?Knee flexion 4 NA due to pain   ?Knee extension 4 NA due to pain   ? ?GAIT: ?Assistive device utilized: None ?Level of assistance: Complete Independence ?Comments: Antalgic on left due to knee pain, left knee valgus, patient remains in a relatively flexed left knee position through gait, flat foot strike on left ?  ?  ?TODAY'S TREATMENT  ?[facility] PT Treatment:                                                DATE: 01/19/2022 ?Therapeutic Exercise: ?NuStep L5 x 5 min with UE/LE while taking subjective ?Seated upper trap and levator scap stretch 2 x 20 sec each ?Supine cervical retraction 2 x 10 with 5 sec ?Supine horizontal abduction with red 2 x 10 ?Supine serratus press with 5# 2 x 10 ?Seated double ER and scap retraction with red 2 x 10 ?Seated chin tuck with overpressure 2 x 10 ?Row with green 2 x 10 ?Extension with red 2 x 10 ? ? ?[facility] Adult PT Treatment:                                                DATE: 01/12/2022 ?Therapeutic Exercise: ?NuStep L5 x 5 min with UE/LE while taking subjective ?Sidelying thoracic rotation x 10 each ?Supine cervical retraction x 10 with 5 sec ?Supine horizontal abduction with red 2 x 10 ?Supine serratus press with 4# 2 x 10 ?Seated double ER and scap retraction with red 2 x 10 ?Row with red 2 x 10 ?Extension with red 2 x 10 ?Manual Therapy: ?Suboccipital release with gentle manual traction x 5 bouts ?Passive upper trap and levator  scap stretch ? ?[facility] Adult PT Treatment:  DATE: 01/05/2022 ?Therapeutic Exercise: ?NuStep L5 x 5 min with UE/LE while taking subjective ?Supine cervical retraction 2 x 5 with 5 sec ?Supine horizontal abduction with yellow 2 x 10 ?Seated upper trap and levator scap stretch 2 x 20 sec each ?Seated double ER and scap retraction with yellow 2 x 10 ?Seated shoulder blade squeezes 10 x 5 sec ?Row with red 2 x 10 ?Manual Therapy: ?Suboccipital release with gentle manual traction x 5 bouts ?  ?PATIENT EDUCATION:  ?Education details: HEP, progress toward goals, follow-up with referring provider ?Person educated: Patient ?Education method: Explanation, Demonstration, Tactile cues, Verbal cues ?Education comprehension: verbalized understanding, returned demonstration, verbal cues required, tactile cues required, and needs further education ?  ?HOME EXERCISE PROGRAM: ?Access Code: [email or web address] ?  ?  ?ASSESSMENT: ?CLINICAL IMPRESSION: ?Patient tolerated therapy well with no adverse effects. She continues to report high levels of neck and back pain, but does demonstrate overall improvement in her neck motion and reports gradual improvement in her functional level on patient surveys. She denies any real improvement in her symptoms despite progress in other areas so at this point will have patient follow-up with her referring provider in order to determine next steps and patient can return to therapy based on doctor's request. Patient would benefit from continued skilled PT to progress her mobility and strength in order to reduce pain and maximize functional ability. ?  ?Objective impairments include Abnormal gait, decreased activity tolerance, decreased ROM, decreased strength, impaired flexibility, postural dysfunction, and pain.  ?  ? ?GOALS: ?Goals reviewed with patient? Yes ?  ?SHORT TERM GOALS: ?  ?STG Name Target Date Goal status  ?1 Patient will be I with initial HEP in order to  progress with therapy. ?Baseline: HEP provided at eval ?12/29/2021: Reviewed HEP from eval, update stretching ?01/19/2022: patient reports independence with current HEP 01/25/2022 MET  ?2 Patient will demonstrat

## 2022-01-19 ENCOUNTER — Encounter: Payer: Self-pay | Admitting: Physical Therapy

## 2022-01-19 ENCOUNTER — Ambulatory Visit: Payer: Medicaid Other | Attending: Neurological Surgery | Admitting: Physical Therapy

## 2022-01-19 ENCOUNTER — Other Ambulatory Visit: Payer: Self-pay

## 2022-01-19 DIAGNOSIS — M545 Low back pain, unspecified: Secondary | ICD-10-CM | POA: Diagnosis present

## 2022-01-19 DIAGNOSIS — M6281 Muscle weakness (generalized): Secondary | ICD-10-CM | POA: Diagnosis present

## 2022-01-19 DIAGNOSIS — R2689 Other abnormalities of gait and mobility: Secondary | ICD-10-CM | POA: Diagnosis present

## 2022-01-19 DIAGNOSIS — M542 Cervicalgia: Secondary | ICD-10-CM

## 2022-01-19 DIAGNOSIS — G8929 Other chronic pain: Secondary | ICD-10-CM | POA: Diagnosis present

## 2022-01-19 NOTE — Patient Instructions (Signed)
Access Code: [email or web address] ?URL: [email or web address] ?Date: 01/19/2022 ?Prepared by: Hilda Blades ? ?Exercises ?- Supine Transversus Abdominis Bracing - Hands on Stomach  - 1 x daily - 7 x weekly - 10 reps - 5 seconds hold ?- Supine Lower Trunk Rotation  - 1 x daily - 7 x weekly - 10 reps - 5 seconds hold ?- Supine Shoulder Horizontal Abduction with Resistance  - 1 x daily - 7 x weekly - 2 sets - 10 reps ?- Cervical Retraction with Overpressure  - 1 x daily - 7 x weekly - 2 sets - 10 reps - 5 seconds hold ?- Gentle Upper Trap Stretch  - 1 x daily - 7 x weekly - 3 reps - 20 seconds hold ?- Gentle Levator Scapulae Stretch  - 1 x daily - 7 x weekly - 3 reps - 20 seconds hold ?- Standing Row with Anchored Resistance  - 1 x daily - 7 x weekly - 2 sets - 10 reps ?- Scapular Retraction with Resistance Advanced  - 1 x daily - 7 x weekly - 2 sets - 10 reps ?- Shoulder External Rotation and Scapular Retraction with Resistance  - 1 x daily - 7 x weekly - 2 sets - 10 reps ?

## 2022-01-29 ENCOUNTER — Other Ambulatory Visit: Payer: Self-pay | Admitting: Student

## 2022-01-29 DIAGNOSIS — M542 Cervicalgia: Secondary | ICD-10-CM

## 2022-02-10 ENCOUNTER — Other Ambulatory Visit: Payer: Self-pay

## 2022-02-13 ENCOUNTER — Ambulatory Visit
Admission: RE | Admit: 2022-02-13 | Discharge: 2022-02-13 | Disposition: A | Discharge status: Home / Self Care | Payer: Medicaid Other | Source: Ambulatory Visit | Attending: Student | Admitting: Student

## 2022-02-13 DIAGNOSIS — M542 Cervicalgia: Secondary | ICD-10-CM

## 2022-02-13 IMAGING — MR MR CERVICAL SPINE W/O CM
4 of 5 series · 30 of 48 positions shown · non-contrast
Comparison: Radiography [DATE].  MRI [DATE].

CLINICAL DATA: Previous cervical fusion.  Neck pain and headache.

EXAM:
MRI CERVICAL SPINE WITHOUT CONTRAST
TECHNIQUE: Multiplanar, multisequence MR imaging of the cervical spine was
performed. No intravenous contrast was administered.

[Series 2: T2 · sagittal · 3.0mm · 0.66mm/px · 8 of 15 slices shown (1 of 2)]
[im 1/15]
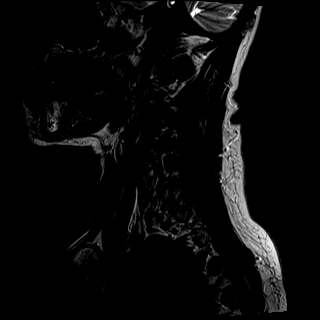
[im 3/15]
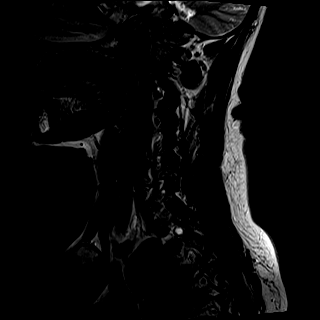
[im 5/15]
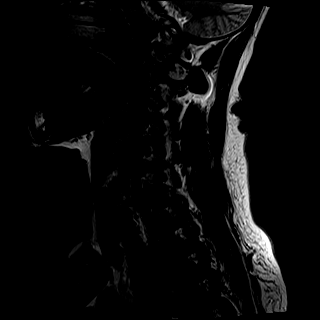
[im 7/15]
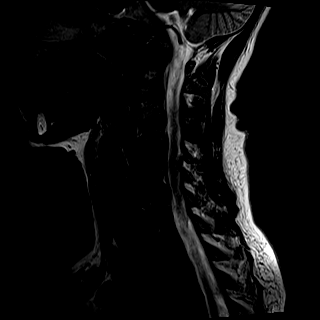
[im 9/15]
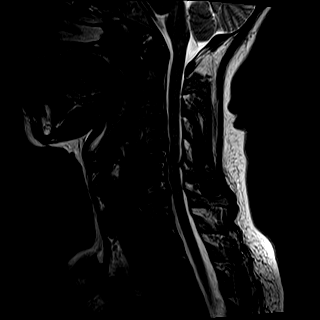
[im 11/15]
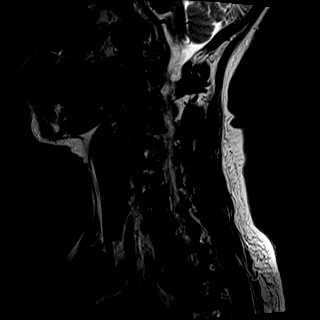
[im 13/15]
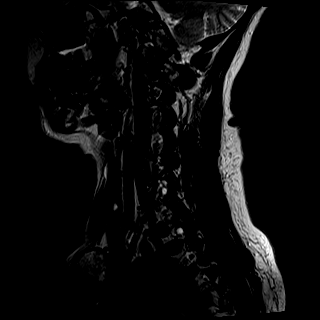
[im 15/15]
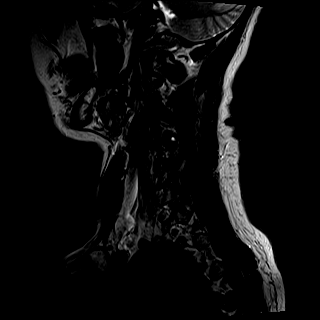

[Series 3: T1 · sagittal · 3.0mm · 0.41mm/px · 7 of 15 slices shown]
[im 1/15]
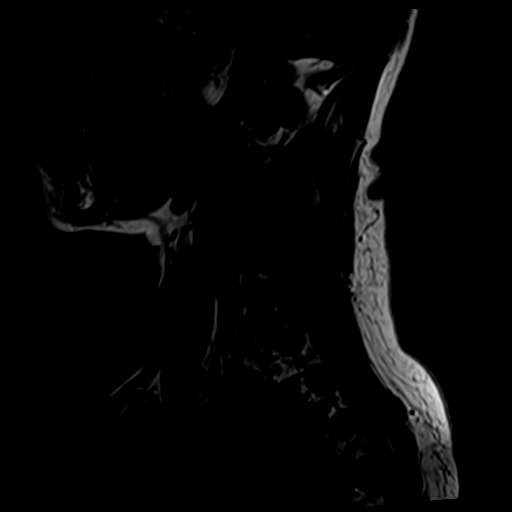
[im 3/15]
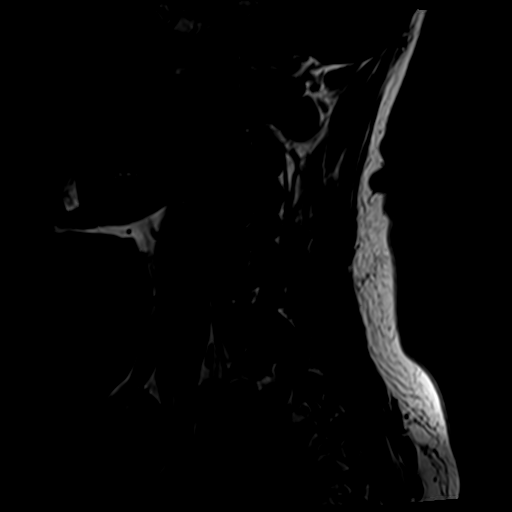
[im 5/15]
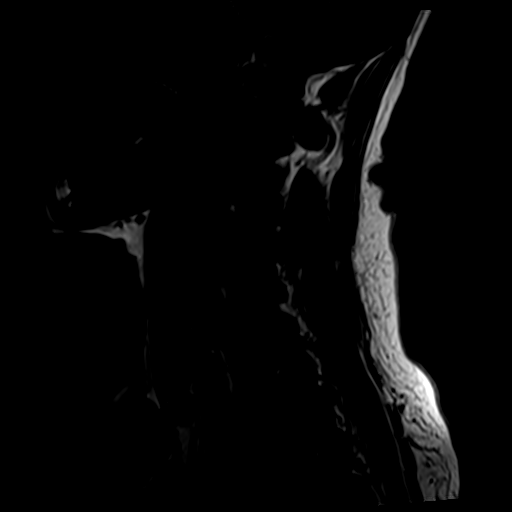
[im 8/15]
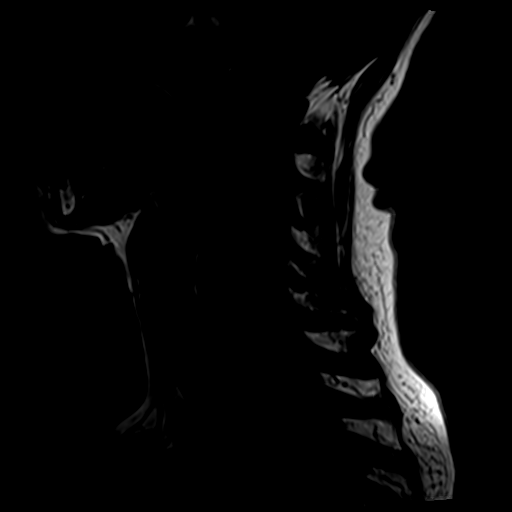
[im 10/15]
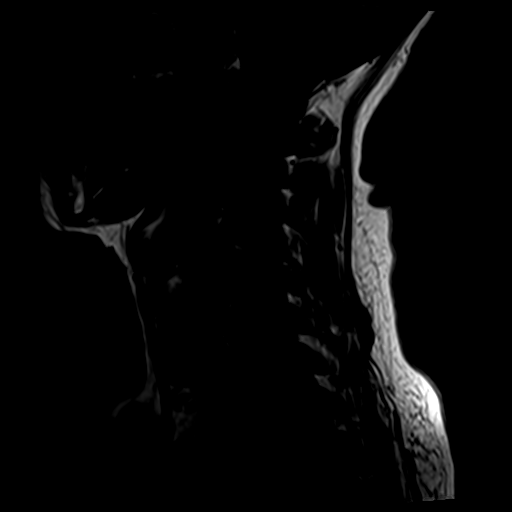
[im 12/15]
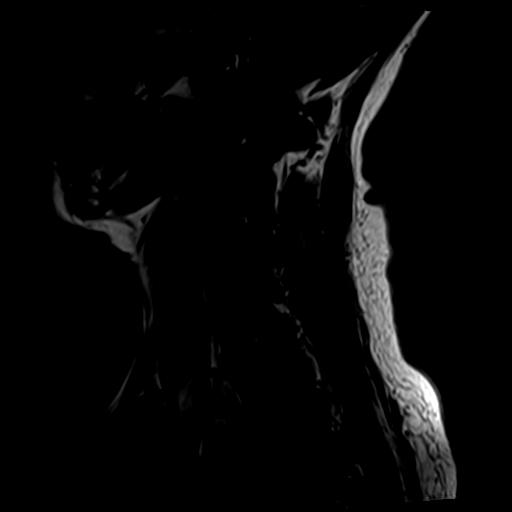
[im 15/15]
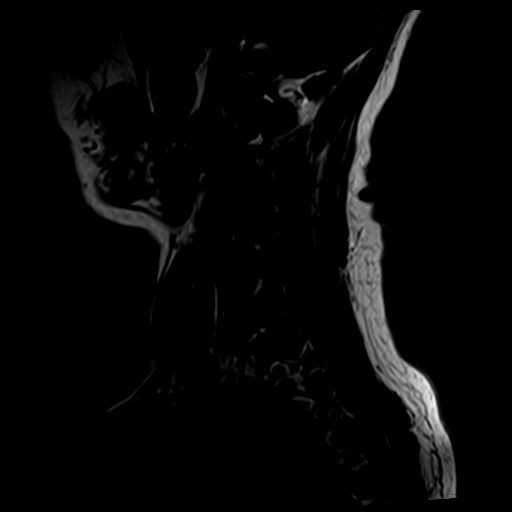

[Series 4: tir sag · sagittal · 3.0mm · 0.41mm/px · 6 of 15 slices shown]
[im 1/15]
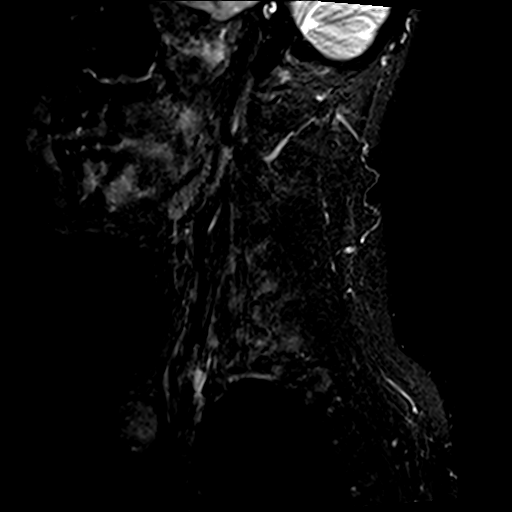
[im 3/15]
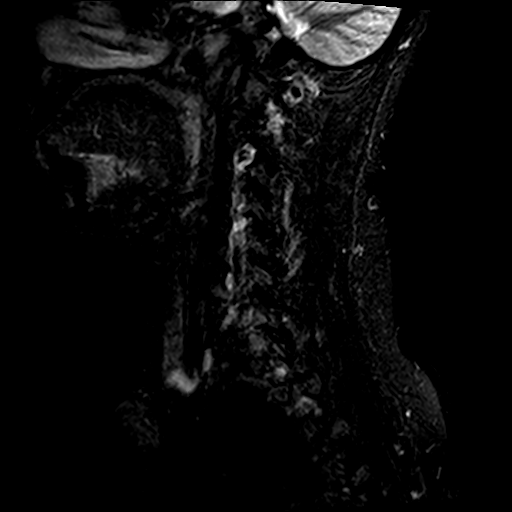
[im 5/15]
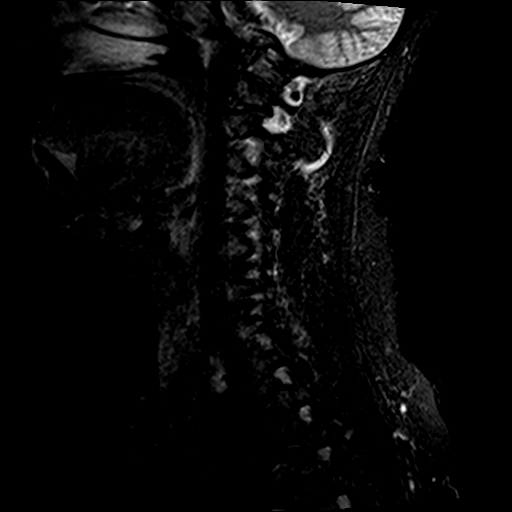
[im 8/15]
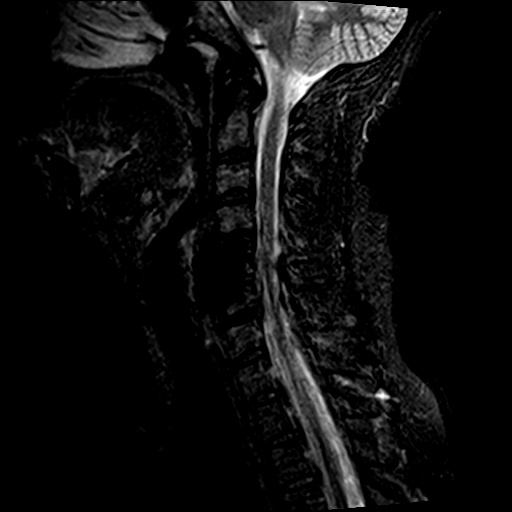
[im 10/15]
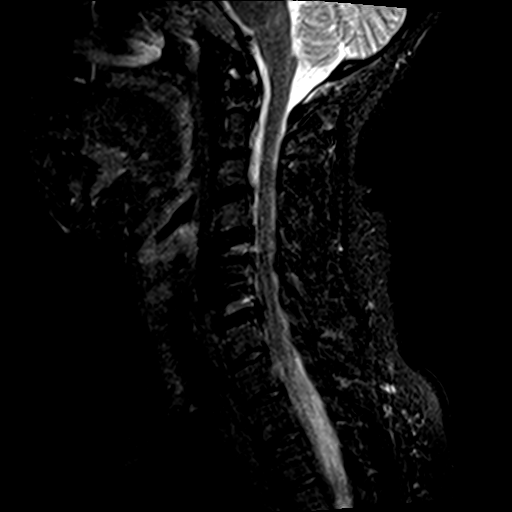
[im 12/15]
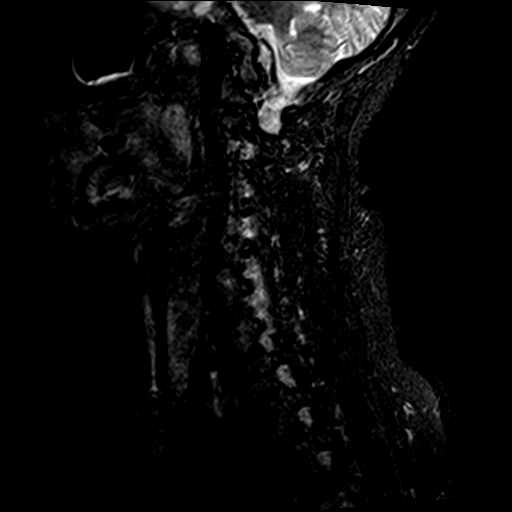

[Series 6: T2 · axial · 3.0mm · 0.70mm/px · z∈[-82,+15]mm · 9 of 27 slices shown (2 of 2)]
[im 1/27]
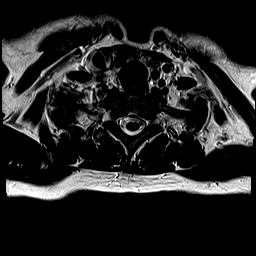
[im 5/27]
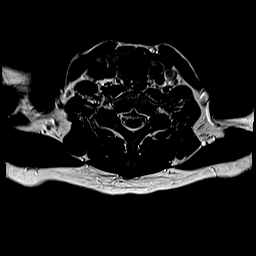
[im 9/27]
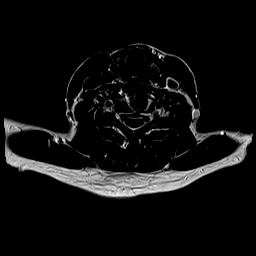
[im 11/27]
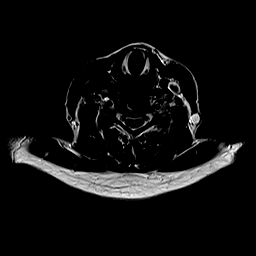
[im 14/27]
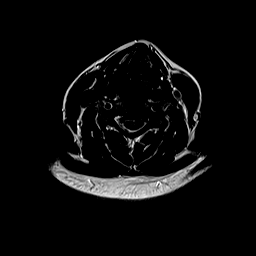
[im 16/27]
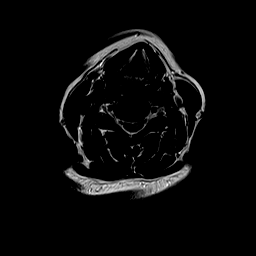
[im 18/27]
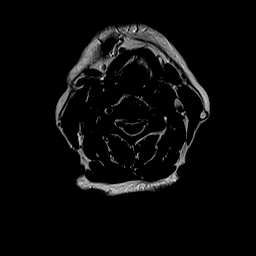
[im 22/27]
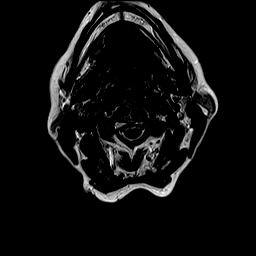
[im 27/27]
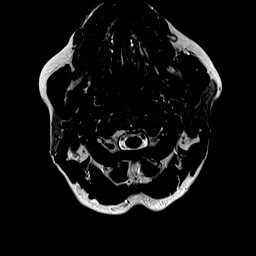

[30 of 48 positions shown; findings below may reference images not displayed]

FINDINGS: Alignment: Normal

Vertebrae: Previous ACDF C5-6.  Otherwise normal.

Cord: No cord compression or focal cord lesion. No evidence of
myelopathy.

Posterior Fossa, vertebral arteries, paraspinal tissues: Negative

Disc levels:

Foramen magnum, C1-2 and C2-3 are normal.

C3-4: Minimal disc bulge. Facet osteoarthritis more pronounced on
the right, but without edema or effusion. No compressive canal or
foraminal narrowing.

C4-5: Mild bulging of the disc. No compressive canal or foraminal
narrowing. No facet arthropathy.

C5-6: Previous ACDF has a good appearance with sufficient patency of
the canal and foramina.

C6-7: Chronic disc bulge with mild narrowing of the ventral
subarachnoid space and mild foraminal narrowing right more than left
but without likely neural compression. No apparent change.

C7-T1: Mild facet osteoarthritis. No stenosis. No joint effusion or
edema.
IMPRESSION: Good appearance at the previous ACDF level C5-6.

No change in mild degenerative spondylosis at C4-5 and C6-7 compared
to the study [DATE]. Mild right foraminal narrowing at C6-7
but without likely neural compression.

Right-sided facet osteoarthritis at C3-4 and bilateral facet
osteoarthritis at C7-T1, but without joint effusions or bone edema
at those locations.

## 2022-02-21 ENCOUNTER — Other Ambulatory Visit: Payer: Self-pay | Admitting: Obstetrics and Gynecology

## 2022-02-21 DIAGNOSIS — N952 Postmenopausal atrophic vaginitis: Secondary | ICD-10-CM

## 2022-03-22 ENCOUNTER — Other Ambulatory Visit: Payer: Self-pay

## 2022-03-22 ENCOUNTER — Emergency Department (HOSPITAL_COMMUNITY)
Admission: EM | Admit: 2022-03-22 | Discharge: 2022-03-23 | Disposition: A | Discharge status: Home / Self Care | Payer: Medicaid Other | Attending: Emergency Medicine | Admitting: Emergency Medicine

## 2022-03-22 ENCOUNTER — Encounter (HOSPITAL_COMMUNITY): Payer: Self-pay

## 2022-03-22 DIAGNOSIS — N12 Tubulo-interstitial nephritis, not specified as acute or chronic: Secondary | ICD-10-CM | POA: Diagnosis not present

## 2022-03-22 DIAGNOSIS — Z91010 Allergy to peanuts: Secondary | ICD-10-CM | POA: Diagnosis not present

## 2022-03-22 DIAGNOSIS — E876 Hypokalemia: Secondary | ICD-10-CM | POA: Diagnosis not present

## 2022-03-22 DIAGNOSIS — R109 Unspecified abdominal pain: Secondary | ICD-10-CM | POA: Diagnosis present

## 2022-03-22 DIAGNOSIS — J45909 Unspecified asthma, uncomplicated: Secondary | ICD-10-CM | POA: Diagnosis not present

## 2022-03-22 DIAGNOSIS — R059 Cough, unspecified: Secondary | ICD-10-CM | POA: Insufficient documentation

## 2022-03-22 DIAGNOSIS — R519 Headache, unspecified: Secondary | ICD-10-CM | POA: Insufficient documentation

## 2022-03-22 DIAGNOSIS — R770 Abnormality of albumin: Secondary | ICD-10-CM | POA: Diagnosis not present

## 2022-03-22 DIAGNOSIS — Z20822 Contact with and (suspected) exposure to covid-19: Secondary | ICD-10-CM | POA: Insufficient documentation

## 2022-03-22 LAB — URINALYSIS, ROUTINE W REFLEX MICROSCOPIC
Bilirubin Urine: NEGATIVE
Glucose, UA: NEGATIVE mg/dL
Hgb urine dipstick: NEGATIVE
Ketones, ur: 5 mg/dL — AB
Nitrite: NEGATIVE
Protein, ur: 30 mg/dL — AB
Specific Gravity, Urine: 1.011 (ref 1.005–1.030)
pH: 6 (ref 5.0–8.0)

## 2022-03-22 LAB — CBC WITH DIFFERENTIAL/PLATELET
Abs Immature Granulocytes: 0.12 10*3/uL — ABNORMAL HIGH (ref 0.00–0.07)
Basophils Absolute: 0 10*3/uL (ref 0.0–0.1)
Basophils Relative: 0 %
Eosinophils Absolute: 0 10*3/uL (ref 0.0–0.5)
Eosinophils Relative: 0 %
HCT: 36.7 % (ref 36.0–46.0)
Hemoglobin: 12 g/dL (ref 12.0–15.0)
Immature Granulocytes: 1 %
Lymphocytes Relative: 11 %
Lymphs Abs: 1.1 10*3/uL (ref 0.7–4.0)
MCH: 32.2 pg (ref 26.0–34.0)
MCHC: 32.7 g/dL (ref 30.0–36.0)
MCV: 98.4 fL (ref 80.0–100.0)
Monocytes Absolute: 1.3 10*3/uL — ABNORMAL HIGH (ref 0.1–1.0)
Monocytes Relative: 14 %
Neutro Abs: 7.1 10*3/uL (ref 1.7–7.7)
Neutrophils Relative %: 74 %
Platelets: 173 10*3/uL (ref 150–400)
RBC: 3.73 MIL/uL — ABNORMAL LOW (ref 3.87–5.11)
RDW: 13.8 % (ref 11.5–15.5)
WBC: 9.6 10*3/uL (ref 4.0–10.5)
nRBC: 0 % (ref 0.0–0.2)

## 2022-03-22 LAB — COMPREHENSIVE METABOLIC PANEL
ALT: 30 U/L (ref 0–44)
AST: 29 U/L (ref 15–41)
Albumin: 3.1 g/dL — ABNORMAL LOW (ref 3.5–5.0)
Alkaline Phosphatase: 119 U/L (ref 38–126)
Anion gap: 11 (ref 5–15)
BUN: 11 mg/dL (ref 6–20)
CO2: 23 mmol/L (ref 22–32)
Calcium: 8.8 mg/dL — ABNORMAL LOW (ref 8.9–10.3)
Chloride: 102 mmol/L (ref 98–111)
Creatinine, Ser: 0.9 mg/dL (ref 0.44–1.00)
GFR, Estimated: >60 mL/min (ref >60)
Glucose, Bld: 108 mg/dL — ABNORMAL HIGH (ref 70–99)
Potassium: 3.2 mmol/L — ABNORMAL LOW (ref 3.5–5.1)
Sodium: 136 mmol/L (ref 135–145)
Total Bilirubin: 1 mg/dL (ref 0.3–1.2)
Total Protein: 6.7 g/dL (ref 6.5–8.1)

## 2022-03-22 LAB — LIPASE, BLOOD: Lipase: 25 U/L (ref 11–51)

## 2022-03-22 MED ORDER — ACETAMINOPHEN 325 MG PO TABS
650.0000 mg | ORAL_TABLET | Freq: Four times a day (QID) | ORAL | Status: DC | PRN
Start: 2022-03-22 — End: 2022-03-23
  Administered 2022-03-23: 650 mg via ORAL
  Filled 2022-03-22 (×2): qty 2

## 2022-03-22 NOTE — ED Provider Triage Note (Signed)
Emergency Medicine Provider Triage Evaluation Note  Bridget Stevens , a 51 y.o. female  was evaluated in triage.  Pt complains of abdominal pain and fever  Review of Systems  Positive: fever Negative: No dairrhea   Physical Exam  BP 109/64 (BP Location: Right Arm)   Pulse (!) 104   Temp (!) 101.1 F (38.4 C) (Oral)   Resp 18   SpO2 96%  Gen:   Awake, no distress   Resp:  Normal effort  MSK:   Moves extremities without difficulty  Other:    Medical Decision Making  Medically screening exam initiated at 7:14 PM.  Appropriate orders placed.  Bridget Stevens was informed that the remainder of the evaluation will be completed by another provider, this initial triage assessment does not replace that evaluation, and the importance of remaining in the ED until their evaluation is complete.     Elson Areas, [place] 03/22/22 3570

## 2022-03-22 NOTE — ED Triage Notes (Signed)
Pt reports since Friday she has been experiencing fever, back, stomach, lightheaded and feeling weak all over. Pt is febrile in triage.

## 2022-03-23 ENCOUNTER — Emergency Department (HOSPITAL_COMMUNITY): Payer: Medicaid Other

## 2022-03-23 LAB — RESP PANEL BY RT-PCR (FLU A&B, COVID) ARPGX2
Influenza A by PCR: NEGATIVE
Influenza B by PCR: NEGATIVE
SARS Coronavirus 2 by RT PCR: NEGATIVE

## 2022-03-23 LAB — LACTIC ACID, PLASMA: Lactic Acid, Venous: 1 mmol/L (ref 0.5–1.9)

## 2022-03-23 IMAGING — CR DG CHEST 2V
2 series · 2 of 2 positions shown · non-contrast
Comparison: [DATE]

CLINICAL DATA: Cough with weakness and fever

EXAM:
CHEST - 2 VIEW

[chest lat]
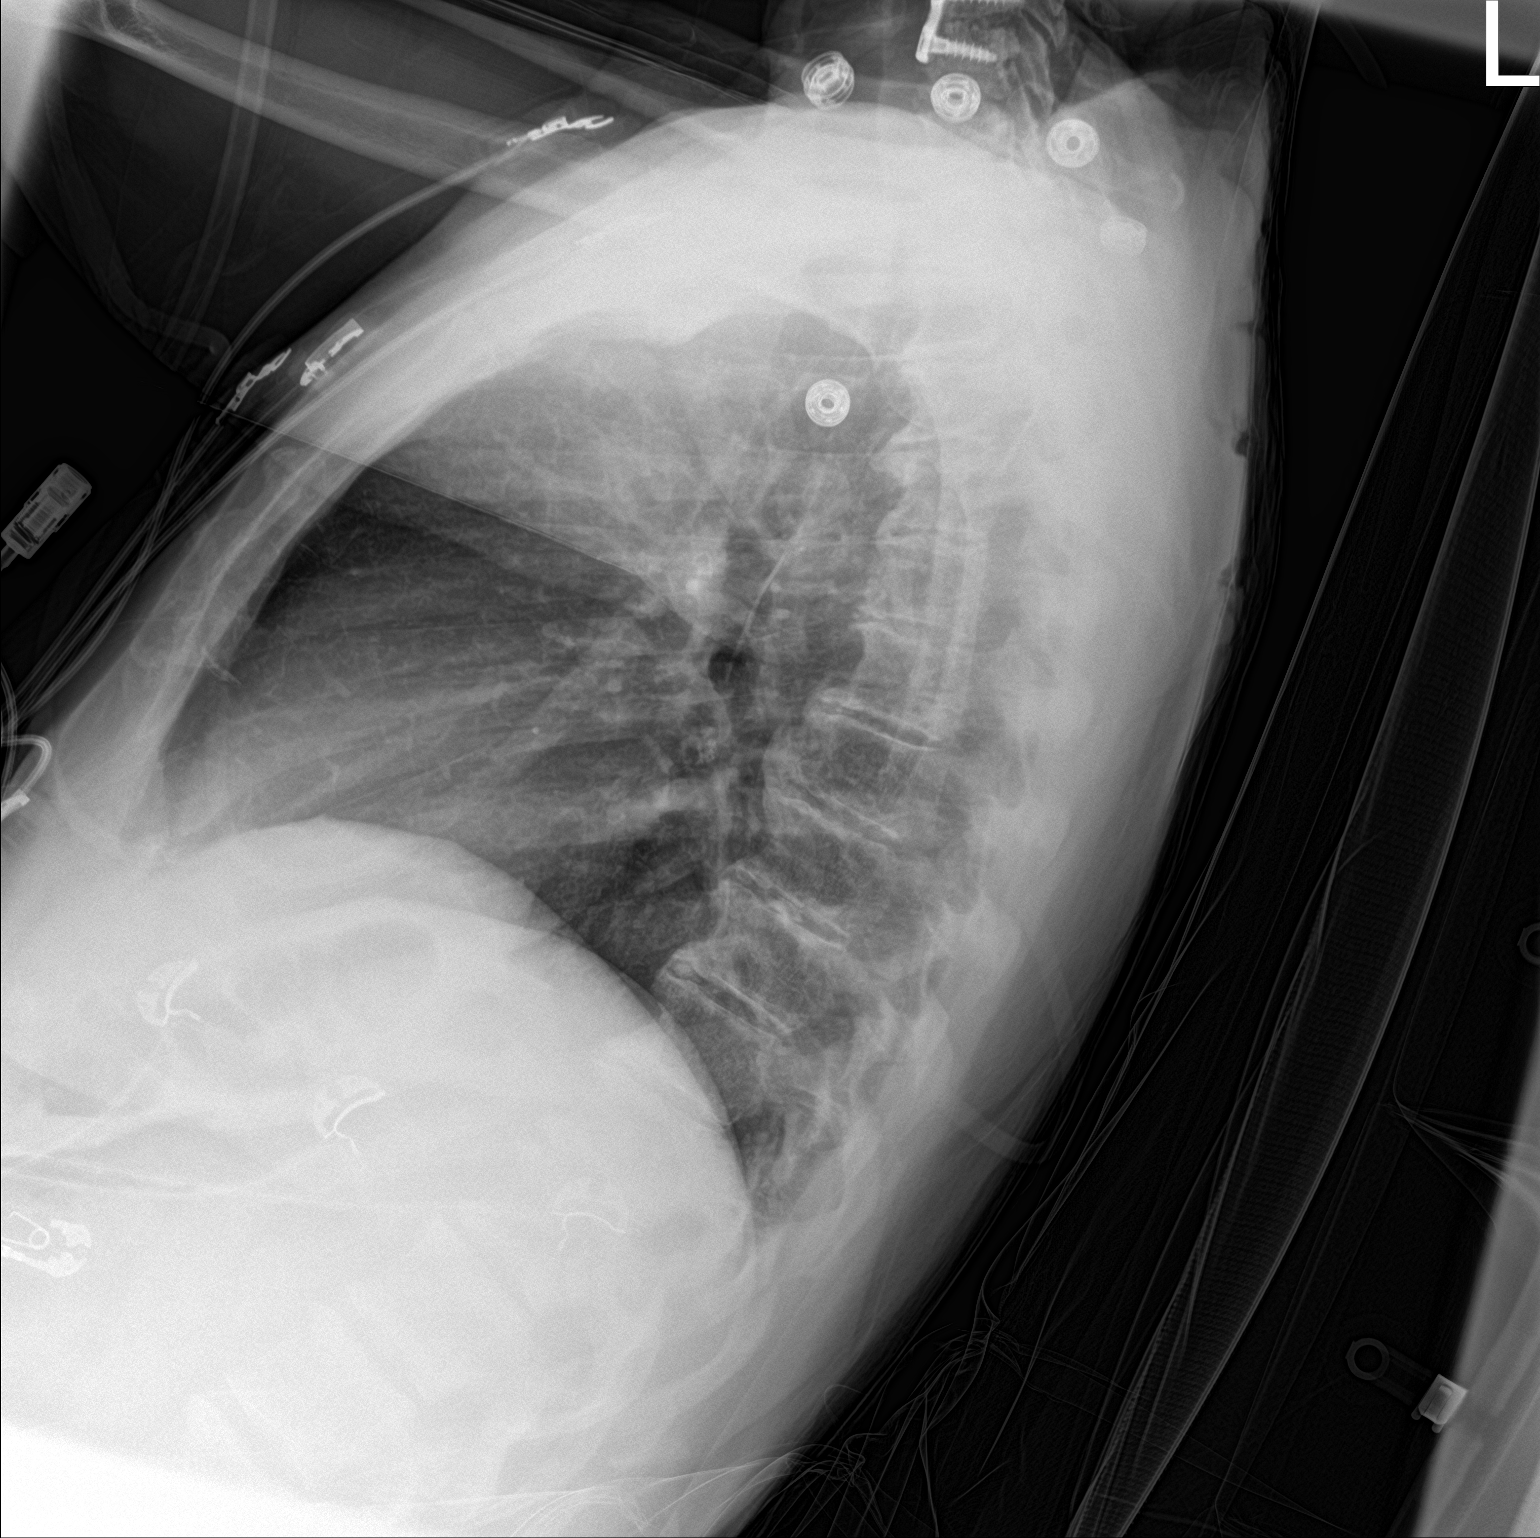

[chest ap]
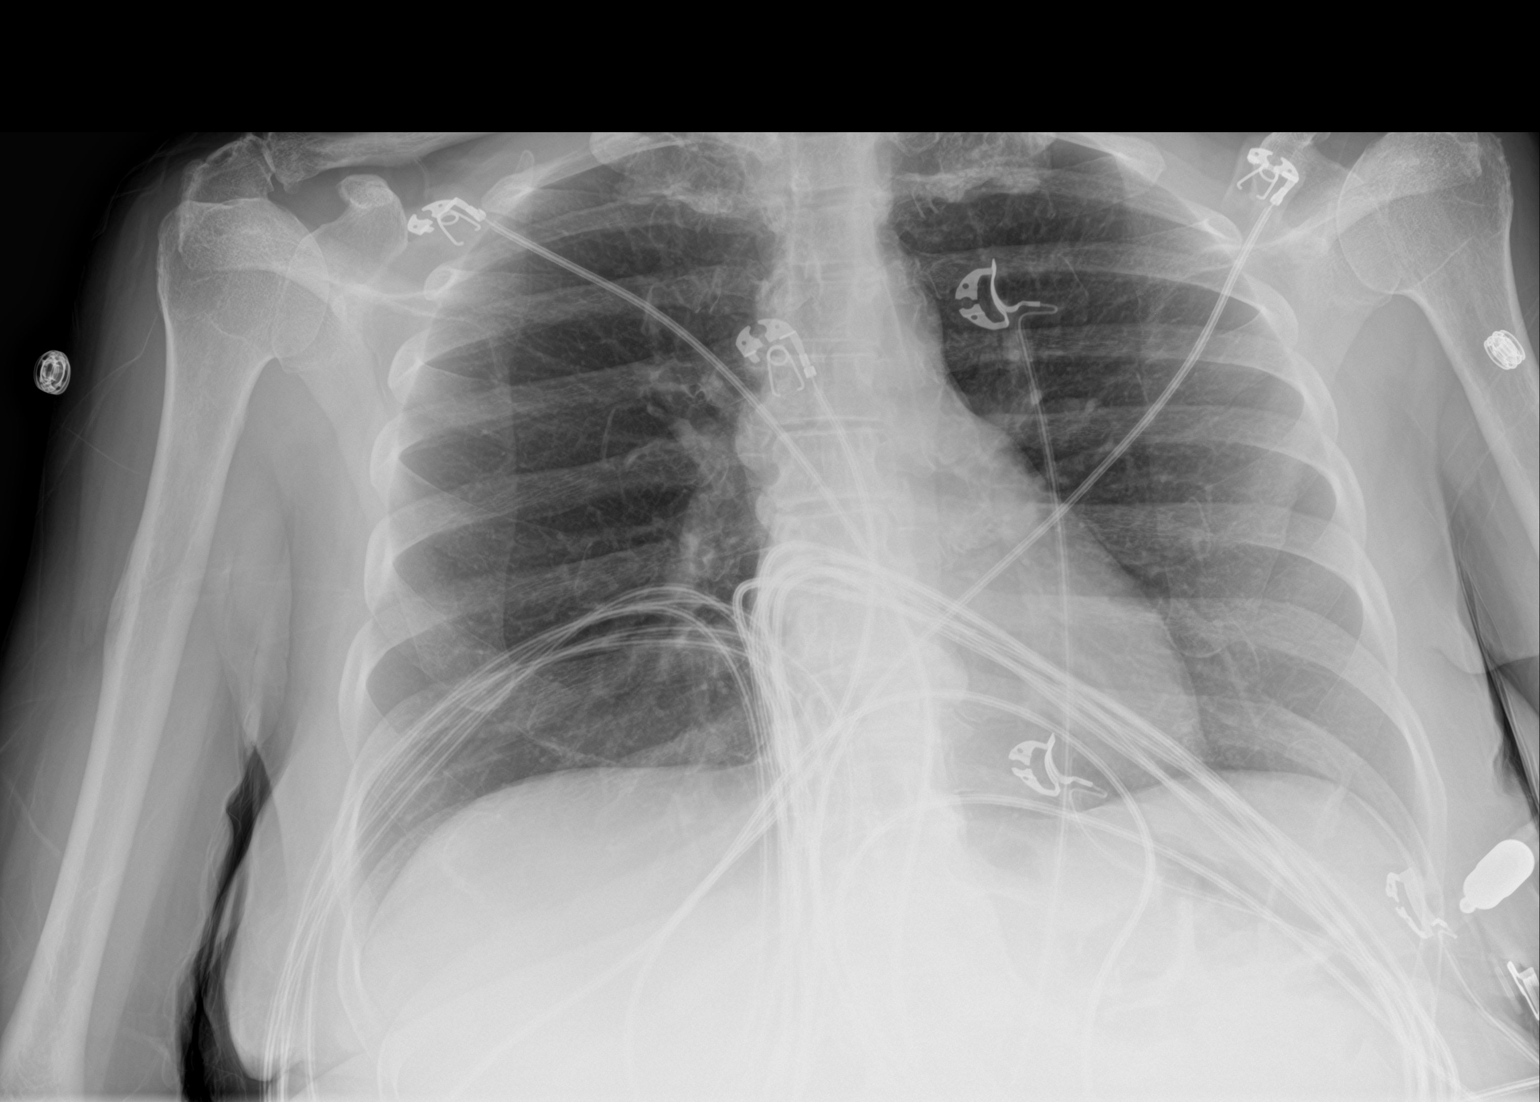

[2 of 2 positions shown; findings below may reference images not displayed]

FINDINGS: Artifact from EKG leads.

Normal heart size and mediastinal contours. No acute infiltrate or
edema. No effusion or pneumothorax. No acute osseous findings. Bulky
degenerative endplate spurring.
IMPRESSION: Negative for pneumonia.

## 2022-03-23 IMAGING — CT CT ABD-PELV W/ CM
2 of 5 series · 16 of 46 positions shown, 18 images · IV contrast (APPLIED)
Comparison: [DATE]

CLINICAL DATA: Abdominal pain.  Fever.

EXAM:
CT ABDOMEN AND PELVIS WITH CONTRAST
TECHNIQUE: Multidetector CT imaging of the abdomen and pelvis was performed
using the standard protocol following bolus administration of
intravenous contrast.

[Series 3: abd/ pelvis 5.0 i30f 2 · axial · 0.79mm/px · z∈[+1010,+1390]mm · 13 of 88 slices shown, 15 images]
[im 6/88  soft-tissue]
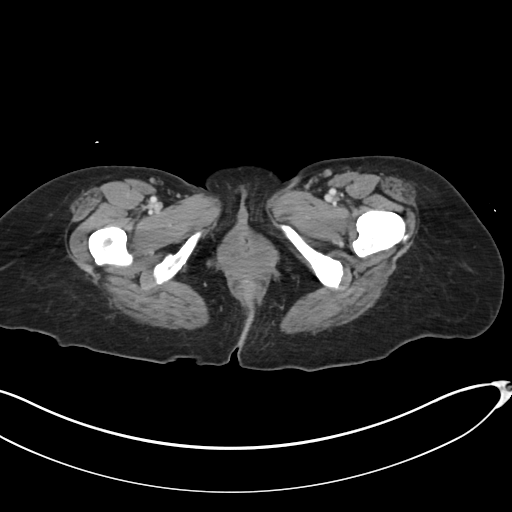
[im 6/88  bone]
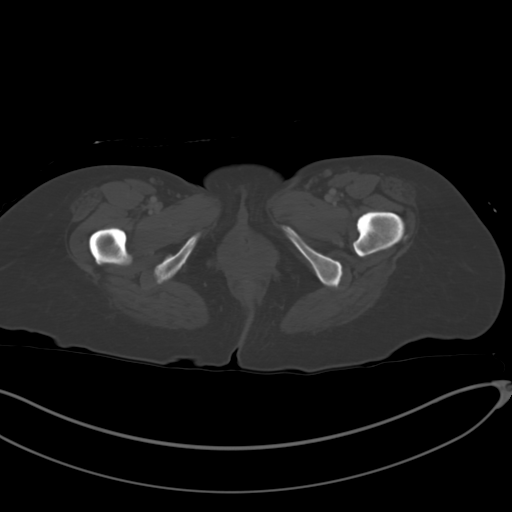
[im 11/88  soft-tissue]
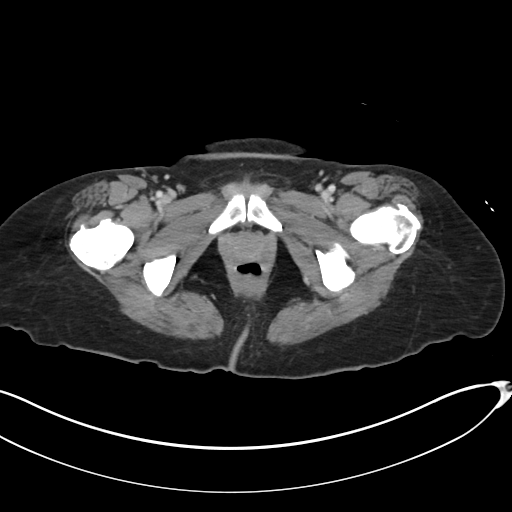
[im 21/88  soft-tissue]
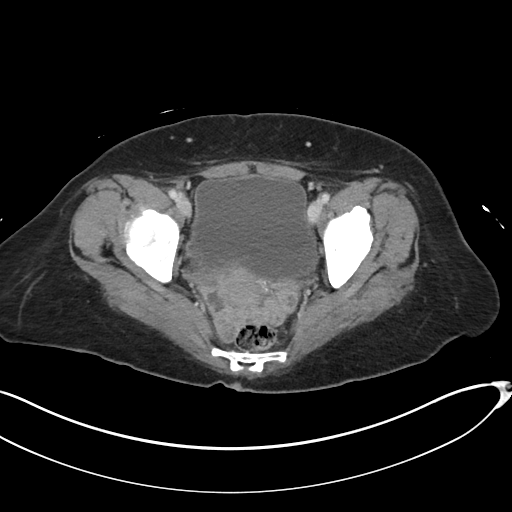
[im 26/88  soft-tissue]
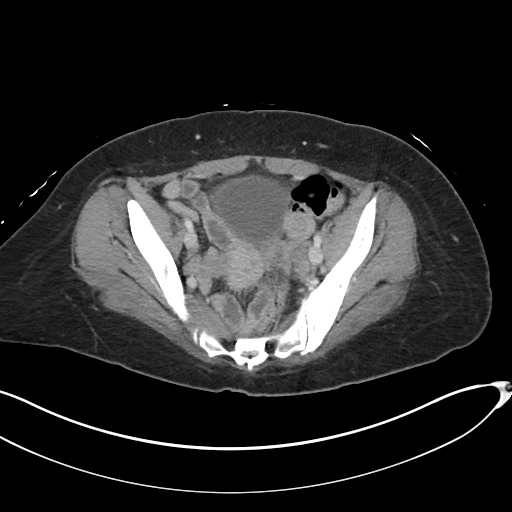
[im 31/88  soft-tissue]
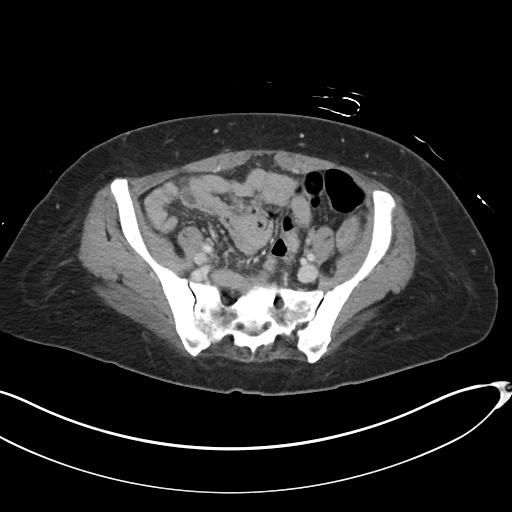
[im 36/88  soft-tissue]
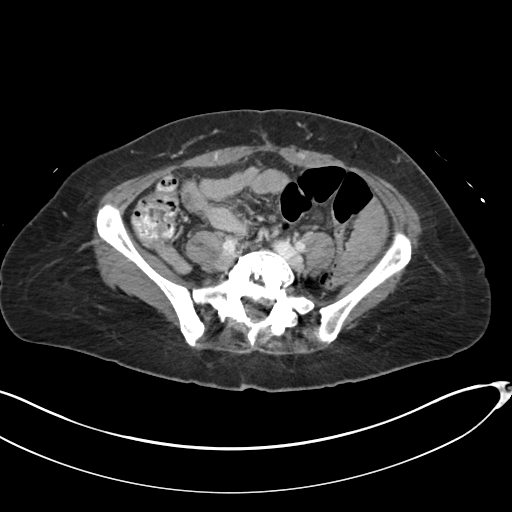
[im 47/88  soft-tissue]
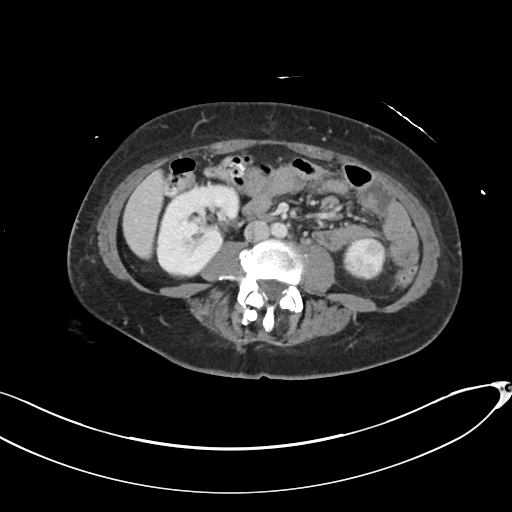
[im 52/88  soft-tissue]
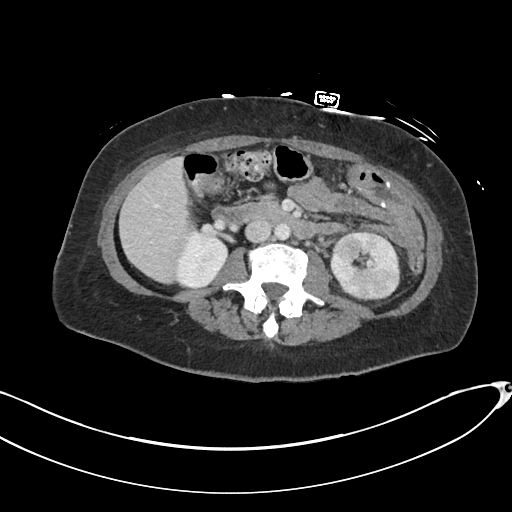
[im 57/88  soft-tissue]
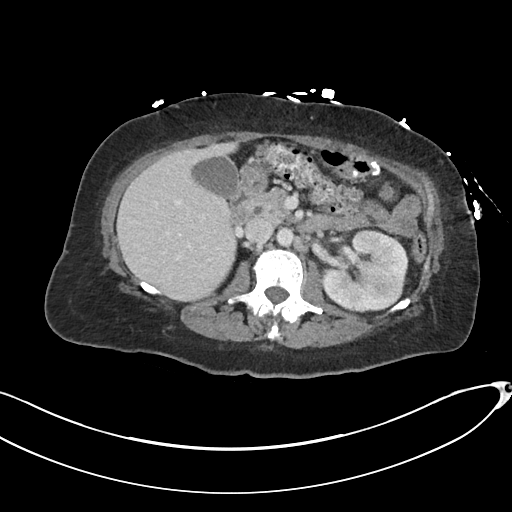
[im 57/88  bone]
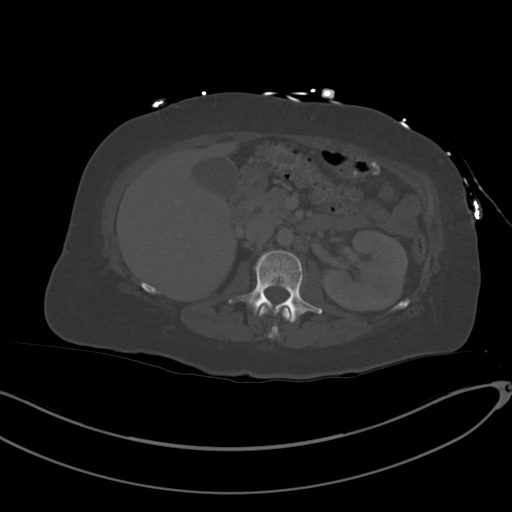
[im 62/88  soft-tissue]
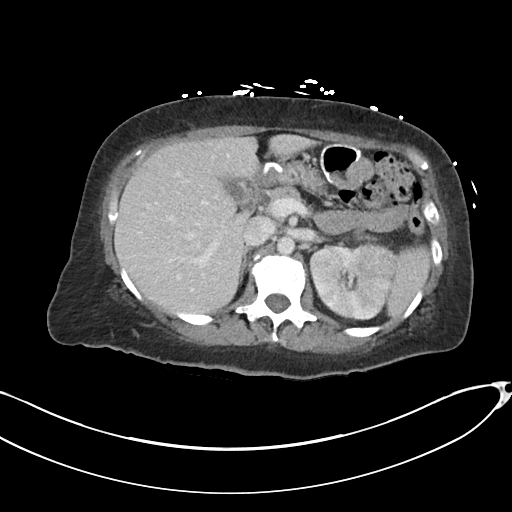
[im 67/88  soft-tissue]
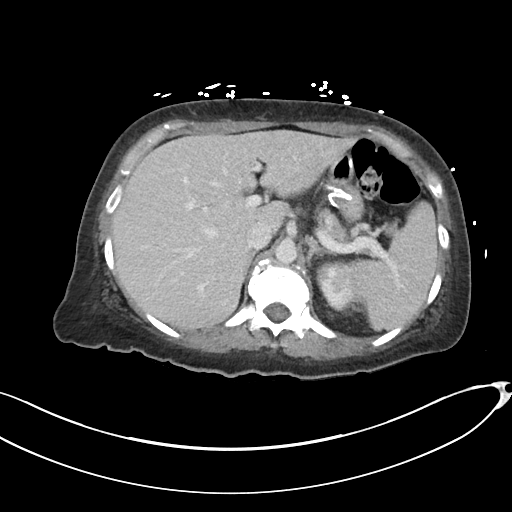
[im 77/88  soft-tissue]
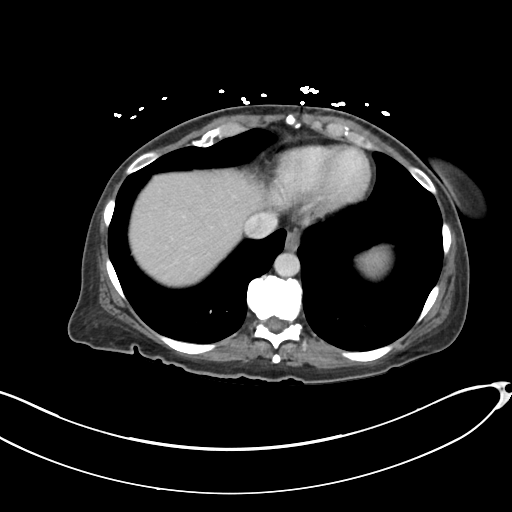
[im 82/88  soft-tissue]
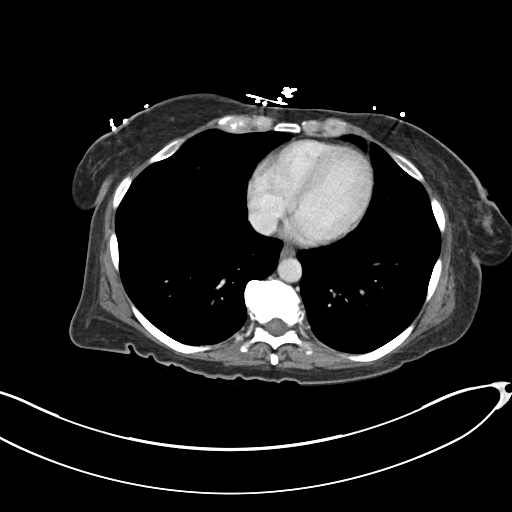

[Series 6: coronal soft tissue · coronal · 0.78mm/px · 3 of 84 slices shown]
[im 28/84  soft-tissue]
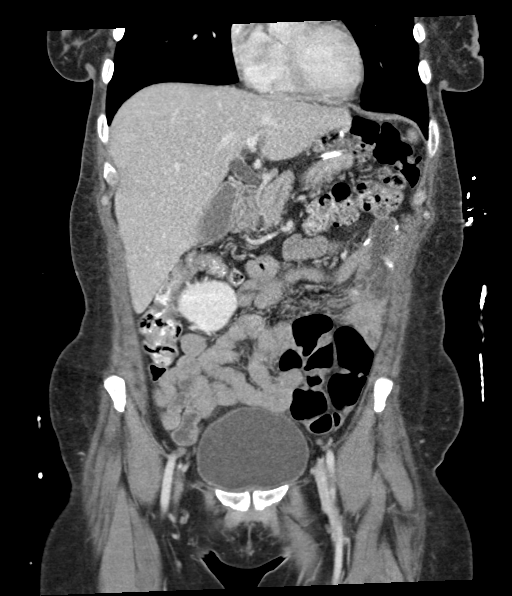
[im 37/84  soft-tissue]
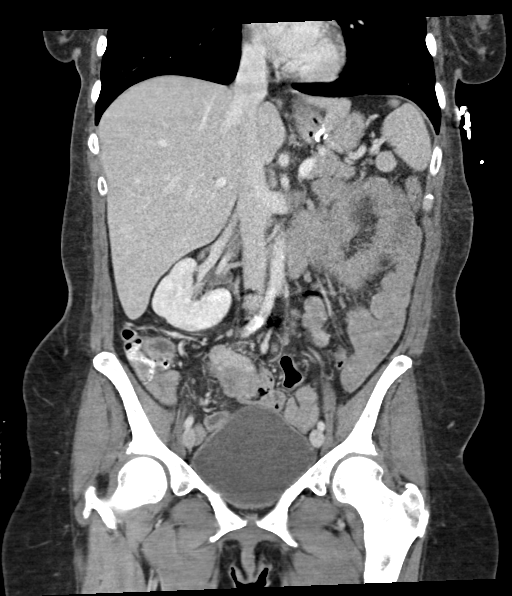
[im 47/84  soft-tissue]
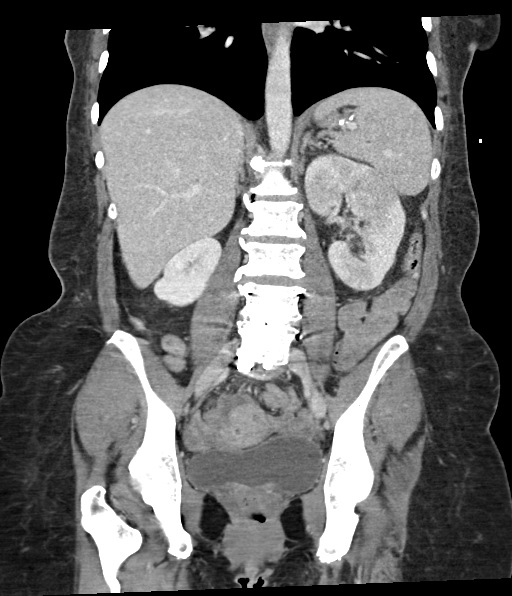

[16 of 46 positions shown; findings below may reference images not displayed]

RADIATION DOSE REDUCTION: This exam was performed according to the
departmental dose-optimization program which includes automated
exposure control, adjustment of the mA and/or kV according to
patient size and/or use of iterative reconstruction technique.

CONTRAST:  100mL OMNIPAQUE IOHEXOL 300 MG/ML  SOLN
FINDINGS: Lower chest: No acute abnormality.

Hepatobiliary: No focal liver abnormality. Gallbladder appears
normal. Fusiform dilatation of the CBD measures up to 9 mm.

Pancreas: Unremarkable. No pancreatic ductal dilatation or
surrounding inflammatory changes.

Spleen: Normal in size without focal abnormality.

Adrenals/Urinary Tract: Normal adrenal glands.

Normal appearance of the right kidney without signs of mass,
nephrolithiasis or hydronephrosis.

Asymmetric edema with striated nephrograms is noted involving the
left kidney compatible with acute pyelonephritis. No mass,
nephrolithiasis or hydronephrosis identified. Urinary bladder is
unremarkable.

Stomach/Bowel: Postoperative changes from prior gastric bypass
surgery. The appendix is visualized and appears normal. No bowel
wall thickening, inflammation, or distension.

Vascular/Lymphatic: Aortic atherosclerosis. No aneurysm. No signs of
abdominopelvic adenopathy.

Reproductive: Uterus and bilateral adnexa are unremarkable.

Other: No significant free fluid or fluid collections. No signs of
pneumoperitoneum.

Musculoskeletal: No acute findings. Status post posterior hardware
fixation and interbody fusion from L4 through S1.
IMPRESSION: 1. Findings compatible with acute left-sided pyelonephritis.
2. Fusiform dilatation of the CBD measures up to 9 mm. No
obstructing stone or mass noted.
3. Aortic Atherosclerosis ([RY]-[RY]).

## 2022-03-23 MED ORDER — POTASSIUM CHLORIDE CRYS ER 20 MEQ PO TBCR
40.0000 meq | EXTENDED_RELEASE_TABLET | Freq: Once | ORAL | Status: AC
  Administered 2022-03-23: 40 meq via ORAL
  Filled 2022-03-23: qty 2

## 2022-03-23 MED ORDER — POTASSIUM CHLORIDE CRYS ER 20 MEQ PO TBCR: 20.0000 meq | EXTENDED_RELEASE_TABLET | Freq: Every day | ORAL | 0 refills | Status: DC

## 2022-03-23 MED ORDER — SODIUM CHLORIDE 0.9 % IV BOLUS
1000.0000 mL | Freq: Once | INTRAVENOUS | Status: AC
  Administered 2022-03-23: 1000 mL via INTRAVENOUS

## 2022-03-23 MED ORDER — IOHEXOL 300 MG/ML  SOLN
100.0000 mL | Freq: Once | INTRAMUSCULAR | Status: AC | PRN
  Administered 2022-03-23: 100 mL via INTRAVENOUS

## 2022-03-23 MED ORDER — CEFPODOXIME PROXETIL 200 MG PO TABS: 200.0000 mg | ORAL_TABLET | Freq: Two times a day (BID) | ORAL | 0 refills | Status: DC

## 2022-03-23 MED ORDER — CEFTRIAXONE SODIUM 1 G IJ SOLR
1.0000 g | Freq: Once | INTRAMUSCULAR | Status: AC
  Administered 2022-03-23: 1 g via INTRAVENOUS
  Filled 2022-03-23: qty 10

## 2022-03-23 NOTE — ED Provider Notes (Signed)
[facility] EMERGENCY DEPARTMENT Provider Note   CSN: [id number] Arrival date & time: 03/22/22  1818     History  Chief Complaint  Patient presents with   Weakness    Bridget Stevens is a 51 y.o. female with a history of asthma, GERD, hyperlipidemia, gastric bypass and prior tubal ligation who presents to the ED with complaints of malaise x 5 days. Patient reports fever to 102, generalized weakness, body aches, bilateral flank pain, abdominal pain, mild headaches, and some cough. Abdominal pain is constant, crampy, no alleviating/aggravating factors. Notes some urinary frequency, has been trying to stay well hydrated. Denies congestion, ear pain, dyspnea, nausea, vomiting, diarrhea, dysuria, or vaginal bleeding/discharge. Denies incontinence, numbness, saddle anesthesia, or hx of IVDU.   HPI     Home Medications Prior to Admission medications   Medication Sig Start Date End Date Taking? Authorizing Provider  acetaminophen (TYLENOL) 500 MG tablet Take 1,000 mg by mouth every 6 (six) hours as needed for moderate pain.     [provider]  albuterol (PROVENTIL HFA;VENTOLIN HFA) 108 (90 Base) MCG/ACT inhaler Inhale 2 puffs into the lungs every 4 (four) hours as needed for wheezing or shortness of breath.     [provider]  Calcium Carb-Cholecalciferol (QC CALCIUM 600 +D3 PO) Take 1 tablet by mouth daily.    [provider]  calcium elemental as carbonate (BARIATRIC TUMS ULTRA) 400 MG chewable tablet Chew 1,000-2,000 mg by mouth 3 (three) times daily as needed for heartburn (acid reflux/indigestion.).    [provider]  cephALEXin (KEFLEX) 500 MG capsule Take 1 capsule (500 mg total) by mouth 4 (four) times daily. 08/29/21   Al Decant, PA-C  Cholecalciferol (VITAMIN D) 50 MCG (2000 UT) CAPS Take 2,000 Units by mouth every evening.     [provider]  COVID-19 mRNA bivalent vaccine, Pfizer, (PFIZER COVID-19 VAC  BIVALENT) injection Inject into the muscle. 08/10/21   Judyann Munson, MD  cycloSPORINE (RESTASIS) 0.05 % ophthalmic emulsion Place 1 drop into both eyes 2 (two) times daily as needed (dry/irritated eyes.).     [provider]  EPINEPHrine 0.3 mg/0.3 mL IJ SOAJ injection Inject 0.3 mg into the muscle as needed for anaphylaxis. 11/21/19   [provider]  gabapentin (NEURONTIN) 300 MG capsule Take 300 mg by mouth in the morning, at noon, and at bedtime.    [provider]  methocarbamol (ROBAXIN) 500 MG tablet Take 500 mg by mouth in the morning and at bedtime.    [provider]  omeprazole (PRILOSEC) 20 MG capsule Take 20 mg by mouth daily.    [provider]  Pediatric Multiple Vit-C-FA (MULTIVITAMIN ANIMAL SHAPES, WITH CA/FA,) with C & FA chewable tablet Chew 1 tablet by mouth 2 (two) times daily.    [provider]  polyethylene glycol (MIRALAX / GLYCOLAX) 17 g packet Take 17 g by mouth daily as needed (constipation.).    [provider]      Allergies    Shellfish allergy, Peanut-containing drug products, Sulfa antibiotics, Advil [ibuprofen], and Percocet [oxycodone-acetaminophen]    Review of Systems   Review of Systems  Constitutional:  Positive for fatigue and fever.  Respiratory:  Positive for cough. Negative for shortness of breath.   Cardiovascular:  Negative for chest pain.  Gastrointestinal:  Positive for abdominal pain. Negative for diarrhea, nausea and vomiting.  Genitourinary:  Positive for flank pain and frequency. Negative for dysuria, vaginal bleeding and vaginal discharge.  Musculoskeletal:  Positive for back pain and myalgias.  Neurological:  Positive for weakness. Negative for syncope and numbness.       Negative for incontinence/saddle anesthesia.   All other systems reviewed and are negative.  Physical Exam Updated Vital Signs BP 104/61 (BP Location: Left Leg)   Pulse 96   Temp (!) 100.7 F (38.2 C)  (Oral)   Resp 16   SpO2 97%  Physical Exam Vitals and nursing note reviewed.  Constitutional:      General: She is not in acute distress.    Appearance: She is well-developed. She is not toxic-appearing.  HENT:     Head: Normocephalic and atraumatic.  Eyes:     General:        Right eye: No discharge.        Left eye: No discharge.     Conjunctiva/sclera: Conjunctivae normal.  Cardiovascular:     Rate and Rhythm: Normal rate and regular rhythm.  Pulmonary:     Effort: No respiratory distress.     Breath sounds: Normal breath sounds. No wheezing or rales.  Abdominal:     General: There is no distension.     Palpations: Abdomen is soft.     Tenderness: There is abdominal tenderness (generalized). There is right CVA tenderness and left CVA tenderness. There is no guarding or rebound.  Musculoskeletal:     Cervical back: Neck supple. No rigidity.  Skin:    General: Skin is warm and dry.  Neurological:     Mental Status: She is alert.     Comments: Alert. Clear speech. Strength & sensation grossly intact to bilateral upper extremities.   Psychiatric:        Behavior: Behavior normal.    ED Results / Procedures / Treatments   Labs (all labs ordered are listed, but only abnormal results are displayed) Labs Reviewed  CBC WITH DIFFERENTIAL/PLATELET - Abnormal; Notable for the following components:      Result Value   RBC 3.73 (*)    Monocytes Absolute 1.3 (*)    Abs Immature Granulocytes 0.12 (*)    All other components within normal limits  COMPREHENSIVE METABOLIC PANEL - Abnormal; Notable for the following components:   Potassium 3.2 (*)    Glucose, Bld 108 (*)    Calcium 8.8 (*)    Albumin 3.1 (*)    All other components within normal limits  URINALYSIS, ROUTINE W REFLEX MICROSCOPIC - Abnormal; Notable for the following components:   APPearance HAZY (*)    Ketones, ur 5 (*)    Protein, ur 30 (*)    Leukocytes,Ua MODERATE (*)    Bacteria, UA MANY (*)    Non Squamous  Epithelial 0-5 (*)    All other components within normal limits  RESP PANEL BY RT-PCR (FLU A&B, COVID) ARPGX2  CULTURE, BLOOD (ROUTINE X 2)  CULTURE, BLOOD (ROUTINE X 2)  URINE CULTURE  LIPASE, BLOOD  LACTIC ACID, PLASMA  LACTIC ACID, PLASMA    EKG None  Radiology DG Chest 2 View  Result Date: 03/23/2022 CLINICAL DATA:  Cough with weakness and fever EXAM: CHEST - 2 VIEW COMPARISON:  11/27/2021 FINDINGS: Artifact from EKG leads. Normal heart size and mediastinal contours. No acute infiltrate or edema. No effusion or pneumothorax. No acute osseous findings. Bulky degenerative endplate spurring. IMPRESSION: Negative for pneumonia. Electronically Signed   By: Tiburcio PeaJonathan  Watts M.D.   On: 03/23/2022 06:17   CT Abdomen Pelvis W Contrast  Result Date: 03/23/2022 CLINICAL DATA:  Abdominal pain.  Fever. EXAM: CT ABDOMEN AND PELVIS WITH CONTRAST TECHNIQUE: Multidetector CT imaging of the abdomen and pelvis was performed using the standard protocol following bolus administration of intravenous contrast. RADIATION DOSE REDUCTION: This exam was performed according to the departmental dose-optimization program which includes automated exposure control, adjustment of the mA and/or kV according to patient size and/or use of iterative reconstruction technique. CONTRAST:  OMNIPAQUE IOHEXOL 300 MG/ML  SOLN COMPARISON:  08/28/2021 FINDINGS: Lower chest: No acute abnormality. Hepatobiliary: No focal liver abnormality. Gallbladder appears normal. Fusiform dilatation of the CBD measures up to 9 mm. Pancreas: Unremarkable. No pancreatic ductal dilatation or surrounding inflammatory changes. Spleen: Normal in size without focal abnormality. Adrenals/Urinary Tract: Normal adrenal glands. Normal appearance of the right kidney without signs of mass, nephrolithiasis or hydronephrosis. Asymmetric edema with striated nephrograms is noted involving the left kidney compatible with acute pyelonephritis. No mass,  nephrolithiasis or hydronephrosis identified. Urinary bladder is unremarkable. Stomach/Bowel: Postoperative changes from prior gastric bypass surgery. The appendix is visualized and appears normal. No bowel wall thickening, inflammation, or distension. Vascular/Lymphatic: Aortic atherosclerosis. No aneurysm. No signs of abdominopelvic adenopathy. Reproductive: Uterus and bilateral adnexa are unremarkable. Other: No significant free fluid or fluid collections. No signs of pneumoperitoneum. Musculoskeletal: No acute findings. Status post posterior hardware fixation and interbody fusion from L4 through S1. IMPRESSION: 1. Findings compatible with acute left-sided pyelonephritis. 2. Fusiform dilatation of the CBD measures up to 9 mm. No obstructing stone or mass noted. 3. Aortic Atherosclerosis (ICD10-I70.0). Electronically Signed   By: Signa Kell M.D.   On: 03/23/2022 06:36    Procedures Procedures    Medications Ordered in ED Medications  acetaminophen (TYLENOL) tablet 650 mg (has no administration in time range)  cefTRIAXone (ROCEPHIN) 1 g in sodium chloride 0.9 % 100 mL IVPB (has no administration in time range)    ED Course/ Medical Decision Making/ A&P                           Medical Decision Making Amount and/or Complexity of Data Reviewed Labs: ordered. Radiology: ordered.  Risk Prescription drug management.   Patient presents to the ED with complaints of generally not feeling well w/ fever, this involves an extensive number of treatment options, and is a complaint that carries with it a high risk of complications and morbidity. Nontoxic, vitals fever and likely initial resultant tachycardia- improved on my exam. Bilateral CVA tenderness and generalized abdominal tenderness present.   Additional history obtained:  Chart & nursing note reviewed.   Lab Tests:  I viewed & interpreted labs including:  CBC: Unremarkable.  CMP: hypokalemia, mild hypocalcemia and hypoalbuminemia  also noted.  Lipase: WNL UA: Concerning for infection.   Meets SIRS criteria w/ infected urine- will check lactic acid, blood cxs, covid/flu, and imaging. Rocephin started for suspected urinary source.  Covid/flu: Negative  Imaging Studies:  I ordered and viewed the following imaging, agree with radiologist impression:  CXR: Negative for pneumonia.  CT A/P: 1. Findings compatible with acute left-sided pyelonephritis. 2. Fusiform dilatation of the CBD measures up to 9 mm. No obstructing stone or mass noted. 3. Aortic Atherosclerosis  ---> In regards to patient's common bile duct dilation, reviewed prior imaging including her CT renal stone study from November 2022, at that time, bile duct measured larger at 10 mm, her LFTs are normal, no findings of obstruction.  ED Course:  Work-up with findings consistent with acute pyelonephritis, no leukocytosis  and lactic acid is normal.  I discussed options of management with the patient including discharge home on oral antibiotics versus admission, patient would prefer to go home which I feel is reasonable.  She is tolerating p.o. in the emergency department, fever improved, will discharge home on oral antibiotics, patient has received a dose of IV ceftriaxone in the emergency department, her urine was sent for culture.  I discussed results, treatment plan, need for follow-up, and return precautions with the patient. Provided opportunity for questions, patient confirmed understanding and is in agreement with plan.    Portions of this note were generated with Scientist, clinical (histocompatibility and immunogenetics). Dictation errors may occur despite best attempts at proofreading.   Final Clinical Impression(s) / ED Diagnoses Final diagnoses:  Pyelonephritis  Hypokalemia    Rx / DC Orders ED Discharge Orders          Ordered    cefpodoxime (VANTIN) 200 MG tablet  2 times daily        03/23/22 0644    potassium chloride SA (KLOR-CON M) 20 MEQ tablet  Daily        03/23/22  0644              Cherly Anderson, PA-C 03/23/22 2201    Shon Baton, MD 03/29/22 1155

## 2022-03-23 NOTE — Discharge Instructions (Addendum)
You were seen in the emergency department today for fever. Your CT scan showed findings of pyelonephritis- which is a kidney infection- please see attached hand out.  We are starting you on antibiotics.  You were given a dose of IV antibiotics in the emergency department.  We are sending you home with a prescription for cefpodoxime which is an antibiotic to continue to treat your infection.  Please take Tylenol per over-the-counter dosing to help with pain and fevers. Your potassium was mildly low- please include potassium rich foods in your diet, we are also sending you home with a few days of a supplement., have this rechecked by your primary care provider as well as your albumin as this was also low.  Your CT scan also showed that your common bile duct was mildly enlarged but you do not have any gallstones- please discuss with your primary care provider.   We have prescribed you new medication(s) today. Discuss the medications prescribed today with your pharmacist as they can have adverse effects and interactions with your other medicines including over the counter and prescribed medications. Seek medical evaluation if you start to experience new or abnormal symptoms after taking one of these medicines, seek care immediately if you start to experience difficulty breathing, feeling of your throat closing, facial swelling, or rash as these could be indications of a more serious allergic reaction  Please follow-up with your primary care provider within 3 days.  Return to the emergency department for new or worsening symptoms including but not limited to new or worsening pain, inability to keep fluids down, passing out, fever after 48 hours of antibiotics, or any other concerns.

## 2022-03-27 ENCOUNTER — Inpatient Hospital Stay (HOSPITAL_COMMUNITY)
Admission: EM | Admit: 2022-03-27 | Discharge: 2022-03-31 | DRG: 872 | Disposition: A | Discharge status: Home / Self Care | Payer: Medicaid Other | Attending: Internal Medicine | Admitting: Internal Medicine

## 2022-03-27 ENCOUNTER — Other Ambulatory Visit: Payer: Self-pay

## 2022-03-27 ENCOUNTER — Telehealth (HOSPITAL_BASED_OUTPATIENT_CLINIC_OR_DEPARTMENT_OTHER): Payer: Self-pay | Admitting: Emergency Medicine

## 2022-03-27 DIAGNOSIS — Z9851 Tubal ligation status: Secondary | ICD-10-CM | POA: Diagnosis not present

## 2022-03-27 DIAGNOSIS — N10 Acute tubulo-interstitial nephritis: Secondary | ICD-10-CM | POA: Diagnosis present

## 2022-03-27 DIAGNOSIS — Z8349 Family history of other endocrine, nutritional and metabolic diseases: Secondary | ICD-10-CM | POA: Diagnosis not present

## 2022-03-27 DIAGNOSIS — E785 Hyperlipidemia, unspecified: Secondary | ICD-10-CM | POA: Diagnosis present

## 2022-03-27 DIAGNOSIS — Z803 Family history of malignant neoplasm of breast: Secondary | ICD-10-CM

## 2022-03-27 DIAGNOSIS — Z79899 Other long term (current) drug therapy: Secondary | ICD-10-CM

## 2022-03-27 DIAGNOSIS — J4520 Mild intermittent asthma, uncomplicated: Secondary | ICD-10-CM

## 2022-03-27 DIAGNOSIS — Z9884 Bariatric surgery status: Secondary | ICD-10-CM

## 2022-03-27 DIAGNOSIS — G8929 Other chronic pain: Secondary | ICD-10-CM | POA: Diagnosis present

## 2022-03-27 DIAGNOSIS — Z91013 Allergy to seafood: Secondary | ICD-10-CM

## 2022-03-27 DIAGNOSIS — Z83438 Family history of other disorder of lipoprotein metabolism and other lipidemia: Secondary | ICD-10-CM | POA: Diagnosis not present

## 2022-03-27 DIAGNOSIS — J45909 Unspecified asthma, uncomplicated: Secondary | ICD-10-CM | POA: Diagnosis present

## 2022-03-27 DIAGNOSIS — R5383 Other fatigue: Secondary | ICD-10-CM | POA: Diagnosis present

## 2022-03-27 DIAGNOSIS — Z888 Allergy status to other drugs, medicaments and biological substances status: Secondary | ICD-10-CM | POA: Diagnosis not present

## 2022-03-27 DIAGNOSIS — Z881 Allergy status to other antibiotic agents status: Secondary | ICD-10-CM

## 2022-03-27 DIAGNOSIS — R61 Generalized hyperhidrosis: Secondary | ICD-10-CM | POA: Diagnosis not present

## 2022-03-27 DIAGNOSIS — Z8249 Family history of ischemic heart disease and other diseases of the circulatory system: Secondary | ICD-10-CM

## 2022-03-27 DIAGNOSIS — R7881 Bacteremia: Secondary | ICD-10-CM | POA: Diagnosis not present

## 2022-03-27 DIAGNOSIS — Z833 Family history of diabetes mellitus: Secondary | ICD-10-CM

## 2022-03-27 DIAGNOSIS — Z885 Allergy status to narcotic agent status: Secondary | ICD-10-CM | POA: Diagnosis not present

## 2022-03-27 DIAGNOSIS — Z825 Family history of asthma and other chronic lower respiratory diseases: Secondary | ICD-10-CM

## 2022-03-27 DIAGNOSIS — K219 Gastro-esophageal reflux disease without esophagitis: Secondary | ICD-10-CM | POA: Diagnosis present

## 2022-03-27 DIAGNOSIS — Z91010 Allergy to peanuts: Secondary | ICD-10-CM

## 2022-03-27 DIAGNOSIS — N12 Tubulo-interstitial nephritis, not specified as acute or chronic: Secondary | ICD-10-CM | POA: Diagnosis not present

## 2022-03-27 DIAGNOSIS — B9620 Unspecified Escherichia coli [E. coli] as the cause of diseases classified elsewhere: Secondary | ICD-10-CM | POA: Diagnosis present

## 2022-03-27 LAB — CBC WITH DIFFERENTIAL/PLATELET
Abs Immature Granulocytes: 0.08 10*3/uL — ABNORMAL HIGH (ref 0.00–0.07)
Basophils Absolute: 0 10*3/uL (ref 0.0–0.1)
Basophils Relative: 0 %
Eosinophils Absolute: 0.1 10*3/uL (ref 0.0–0.5)
Eosinophils Relative: 1 %
HCT: 35.2 % — ABNORMAL LOW (ref 36.0–46.0)
Hemoglobin: 11.5 g/dL — ABNORMAL LOW (ref 12.0–15.0)
Immature Granulocytes: 1 %
Lymphocytes Relative: 15 %
Lymphs Abs: 1.5 10*3/uL (ref 0.7–4.0)
MCH: 32.2 pg (ref 26.0–34.0)
MCHC: 32.7 g/dL (ref 30.0–36.0)
MCV: 98.6 fL (ref 80.0–100.0)
Monocytes Absolute: 0.8 10*3/uL (ref 0.1–1.0)
Monocytes Relative: 8 %
Neutro Abs: 7.4 10*3/uL (ref 1.7–7.7)
Neutrophils Relative %: 75 %
Platelets: 407 10*3/uL — ABNORMAL HIGH (ref 150–400)
RBC: 3.57 MIL/uL — ABNORMAL LOW (ref 3.87–5.11)
RDW: 14 % (ref 11.5–15.5)
WBC: 9.9 10*3/uL (ref 4.0–10.5)
nRBC: 0 % (ref 0.0–0.2)

## 2022-03-27 LAB — BLOOD CULTURE ID PANEL (REFLEXED) - BCID2

## 2022-03-27 LAB — LACTIC ACID, PLASMA: Lactic Acid, Venous: 1.4 mmol/L (ref 0.5–1.9)

## 2022-03-27 LAB — BASIC METABOLIC PANEL
Anion gap: 7 (ref 5–15)
BUN: 10 mg/dL (ref 6–20)
CO2: 30 mmol/L (ref 22–32)
Calcium: 9.1 mg/dL (ref 8.9–10.3)
Chloride: 107 mmol/L (ref 98–111)
Creatinine, Ser: 0.68 mg/dL (ref 0.44–1.00)
GFR, Estimated: >60 mL/min (ref >60)
Glucose, Bld: 108 mg/dL — ABNORMAL HIGH (ref 70–99)
Potassium: 4.6 mmol/L (ref 3.5–5.1)
Sodium: 144 mmol/L (ref 135–145)

## 2022-03-27 LAB — HEPATIC FUNCTION PANEL
ALT: 24 U/L (ref 0–44)
AST: 23 U/L (ref 15–41)
Albumin: 3 g/dL — ABNORMAL LOW (ref 3.5–5.0)
Alkaline Phosphatase: 134 U/L — ABNORMAL HIGH (ref 38–126)
Bilirubin, Direct: <0.1 mg/dL (ref 0.0–0.2)
Total Bilirubin: 0.4 mg/dL (ref 0.3–1.2)
Total Protein: 7.5 g/dL (ref 6.5–8.1)

## 2022-03-27 LAB — MAGNESIUM: Magnesium: 2.6 mg/dL — ABNORMAL HIGH (ref 1.7–2.4)

## 2022-03-27 MED ORDER — ACETAMINOPHEN 500 MG PO TABS: 1000.0000 mg | ORAL_TABLET | Freq: Once | ORAL | Status: AC

## 2022-03-27 MED ORDER — ACETAMINOPHEN 325 MG PO TABS
650.0000 mg | ORAL_TABLET | Freq: Four times a day (QID) | ORAL | Status: DC | PRN
  Administered 2022-03-28 – 2022-03-31 (×8): 650 mg via ORAL
  Filled 2022-03-27 (×8): qty 2

## 2022-03-27 MED ORDER — ACETAMINOPHEN 500 MG PO TABS
ORAL_TABLET | ORAL | Status: AC
  Administered 2022-03-27: 1000 mg via ORAL
  Filled 2022-03-27: qty 2

## 2022-03-27 MED ORDER — ALBUTEROL SULFATE (2.5 MG/3ML) 0.083% IN NEBU: 2.5000 mg | INHALATION_SOLUTION | RESPIRATORY_TRACT | Status: DC | PRN

## 2022-03-27 MED ORDER — SODIUM CHLORIDE 0.9 % IV BOLUS
1000.0000 mL | Freq: Once | INTRAVENOUS | Status: AC
  Administered 2022-03-27: 1000 mL via INTRAVENOUS

## 2022-03-27 MED ORDER — SODIUM CHLORIDE 0.9 % IV SOLN
2.0000 g | Freq: Once | INTRAVENOUS | Status: AC
  Administered 2022-03-27: 2 g via INTRAVENOUS
  Filled 2022-03-27: qty 20

## 2022-03-27 MED ORDER — SODIUM CHLORIDE 0.9 % IV SOLN
2.0000 g | INTRAVENOUS | Status: DC
  Administered 2022-03-28 – 2022-03-29 (×2): 2 g via INTRAVENOUS
  Filled 2022-03-27 (×2): qty 20

## 2022-03-27 MED ORDER — ALBUTEROL SULFATE HFA 108 (90 BASE) MCG/ACT IN AERS: 2.0000 | INHALATION_SPRAY | RESPIRATORY_TRACT | Status: DC | PRN

## 2022-03-27 MED ORDER — PANTOPRAZOLE SODIUM 40 MG IV SOLR
40.0000 mg | Freq: Every day | INTRAVENOUS | Status: DC
  Administered 2022-03-27: 40 mg via INTRAVENOUS
  Filled 2022-03-27: qty 10

## 2022-03-27 MED ORDER — ACETAMINOPHEN 650 MG RE SUPP: 650.0000 mg | Freq: Four times a day (QID) | RECTAL | Status: DC | PRN

## 2022-03-27 MED ORDER — SODIUM CHLORIDE 0.9 % IV SOLN: INTRAVENOUS | Status: AC

## 2022-03-27 MED ORDER — GABAPENTIN 300 MG PO CAPS
300.0000 mg | ORAL_CAPSULE | Freq: Three times a day (TID) | ORAL | Status: DC
  Administered 2022-03-27 – 2022-03-31 (×11): 300 mg via ORAL
  Filled 2022-03-27 (×11): qty 1

## 2022-03-27 NOTE — Subjective & Objective (Signed)
Was seen in the emergency department on 22 March 2022 for pyelonephritis originally presents with 5 days of fever chills body aches flank pain.  Blood cultures and urine cultures were obtained at the time patient went home after work-up showed CT scan showing acute left-sided pyelonephritis no stone no mass noted although the common bile duct noted to be a enlarged to 9 mm which is chronic LFTs abdominal normal she was given 1 dose of ceftriaxone IV in the emergency department and discharged home on Vantin 200 mg 2 times a day she has been taking her medications Continued to have intermittent fevers.  Blood cultures turned out to be positive and patient was instructed to come back to emergency department She still has diffuse fatigue and abdominal discomfort no nausea no vomiting but does have decreased p.o. intake

## 2022-03-27 NOTE — Assessment & Plan Note (Addendum)
Blood cultures grew Enterobacter oralis and E. Coli ID is awre rec rocephin IV Repeat blood cultures observe on tele

## 2022-03-27 NOTE — ED Provider Notes (Signed)
[facility] DEPT Provider Note   CSN: [id number] Arrival date & time: 03/27/22  1521     History  No chief complaint on file.   Bridget Stevens is a 51 y.o. female.  51 year old female with prior medical history as detailed below presents for evaluation.  Patient was seen on June 6.  She was diagnosed with pyelonephritis.  She was discharged home on Vantin.  Patient received a call today telling her to come back to the ED secondary to positive blood cultures.  Patient complains of continued fatigue and diffuse abdominal discomfort.  She denies any vomiting.  She reports continued fevers.  She reports decreased p.o. intake.  She is compliant with previously prescribed Vantin.  The history is provided by the patient and medical records.  Illness Location:  Diagnosed with pyelonephritis on June 6, on Vantin, returning now secondary to positive blood cultures from June 6. Severity:  Moderate Onset quality:  Gradual Duration:  4 days Timing:  Rare Progression:  Worsening Chronicity:  New      Home Medications Prior to Admission medications   Medication Sig Start Date End Date Taking? Authorizing Provider  acetaminophen (TYLENOL) 500 MG tablet Take 1,000 mg by mouth every 6 (six) hours as needed for moderate pain.     [provider]  albuterol (PROVENTIL HFA;VENTOLIN HFA) 108 (90 Base) MCG/ACT inhaler Inhale 2 puffs into the lungs every 4 (four) hours as needed for wheezing or shortness of breath.     [provider]  Calcium Carb-Cholecalciferol (QC CALCIUM 600 +D3 PO) Take 1 tablet by mouth daily.    [provider]  calcium elemental as carbonate (BARIATRIC TUMS ULTRA) 400 MG chewable tablet Chew 1,000-2,000 mg by mouth 3 (three) times daily as needed for heartburn (acid reflux/indigestion.).    [provider]  cefpodoxime (VANTIN) 200 MG tablet Take 1 tablet (200 mg total) by mouth 2 (two) times daily. 03/23/22    Petrucelli, Samantha R, PA-C  cephALEXin (KEFLEX) 500 MG capsule Take 1 capsule (500 mg total) by mouth 4 (four) times daily. 08/29/21   Azucena Cecil, PA-C  Cholecalciferol (VITAMIN D) 50 MCG (2000 UT) CAPS Take 2,000 Units by mouth every evening.     [provider]  COVID-19 mRNA bivalent vaccine, Pfizer, (PFIZER COVID-19 VAC BIVALENT) injection Inject into the muscle. 08/10/21   Carlyle Basques, MD  cycloSPORINE (RESTASIS) 0.05 % ophthalmic emulsion Place 1 drop into both eyes 2 (two) times daily as needed (dry/irritated eyes.).     [provider]  EPINEPHrine 0.3 mg/0.3 mL IJ SOAJ injection Inject 0.3 mg into the muscle as needed for anaphylaxis. 11/21/19   [provider]  gabapentin (NEURONTIN) 300 MG capsule Take 300 mg by mouth in the morning, at noon, and at bedtime.    [provider]  methocarbamol (ROBAXIN) 500 MG tablet Take 500 mg by mouth in the morning and at bedtime.    [provider]  omeprazole (PRILOSEC) 20 MG capsule Take 20 mg by mouth daily.    [provider]  Pediatric Multiple Vit-C-FA (MULTIVITAMIN ANIMAL SHAPES, WITH CA/FA,) with C & FA chewable tablet Chew 1 tablet by mouth 2 (two) times daily.    [provider]  polyethylene glycol (MIRALAX / GLYCOLAX) 17 g packet Take 17 g by mouth daily as needed (constipation.).    [provider]  potassium chloride SA (KLOR-CON M) 20 MEQ tablet Take 1 tablet (20 mEq total) by mouth daily.  03/23/22   Petrucelli, Samantha R, PA-C      Allergies    Shellfish allergy, Peanut-containing drug products, Sulfa antibiotics, Advil [ibuprofen], and Percocet [oxycodone-acetaminophen]    Review of Systems   Review of Systems  All other systems reviewed and are negative.   Physical Exam Updated Vital Signs BP 116/73   Pulse 80   Temp 98.9 F (37.2 C) (Oral)   Resp 16   Ht 4\' 10"  (1.473 m)   Wt 60.8 kg   SpO2 100%   BMI 28.01 kg/m  Physical  Exam Vitals and nursing note reviewed.  Constitutional:      General: She is not in acute distress.    Appearance: Normal appearance. She is well-developed.  HENT:     Head: Normocephalic and atraumatic.  Eyes:     Conjunctiva/sclera: Conjunctivae normal.     Pupils: Pupils are equal, round, and reactive to light.  Cardiovascular:     Rate and Rhythm: Normal rate and regular rhythm.     Heart sounds: Normal heart sounds.  Pulmonary:     Effort: Pulmonary effort is normal. No respiratory distress.     Breath sounds: Normal breath sounds.  Abdominal:     General: There is no distension.     Palpations: Abdomen is soft.     Tenderness: There is no abdominal tenderness.  Musculoskeletal:        General: No deformity. Normal range of motion.     Cervical back: Normal range of motion and neck supple.  Skin:    General: Skin is warm and dry.  Neurological:     General: No focal deficit present.     Mental Status: She is alert and oriented to person, place, and time.     ED Results / Procedures / Treatments   Labs (all labs ordered are listed, but only abnormal results are displayed) Labs Reviewed  BASIC METABOLIC PANEL - Abnormal; Notable for the following components:      Result Value   Glucose, Bld 108 (*)    All other components within normal limits  CBC WITH DIFFERENTIAL/PLATELET - Abnormal; Notable for the following components:   RBC 3.57 (*)    Hemoglobin 11.5 (*)    HCT 35.2 (*)    Platelets 407 (*)    Abs Immature Granulocytes 0.08 (*)    All other components within normal limits  CULTURE, BLOOD (ROUTINE X 2)  CULTURE, BLOOD (ROUTINE X 2)  URINALYSIS, ROUTINE W REFLEX MICROSCOPIC    EKG None  Radiology No results found.  Procedures Procedures    Medications Ordered in ED Medications  sodium chloride 0.9 % bolus 1,000 mL (has no administration in time range)  cefTRIAXone (ROCEPHIN) 2 g in sodium chloride 0.9 % 100 mL IVPB (has no administration in time  range)    ED Course/ Medical Decision Making/ A&P                           Medical Decision Making   Medical Screen Complete  This patient presented to the ED with complaint of positive blood cultures, abdominal pain, fatigue, fever, pyelonephritis.  This complaint involves an extensive number of treatment options. The initial differential diagnosis includes, but is not limited to, metabolic abnormality, bacteremia, etc.  This presentation is: Acute, Self-Limited, Previously Undiagnosed, Uncertain Prognosis, Complicated, Systemic Symptoms, and Threat to Life/Bodily Function  Patient seen on June 6 for evaluation.  She was diagnosed with pyelonephritis.  Started on [place].  Patient called back secondary to positive blood cultures.  Patient with continued complaints of malaise, fatigue, crampy abdominal discomfort, fever.  Case discussed with ID Dr. Graylon Good.  She agrees with plan for admission and and transition to IV Rocephin.  Patient understands plan of care.  Hospitalist service aware case and will evaluate for admission.      Additional history obtained:  External records from outside sources obtained and reviewed including prior ED visits and prior Inpatient records.    Lab Tests:  I ordered and personally interpreted labs.  The pertinent results include: CBC, BMP, blood cultures, UA  Cardiac Monitoring:  The patient was maintained on a cardiac monitor.  I personally viewed and interpreted the cardiac monitor which showed an underlying rhythm of: NSR   Medicines ordered:  I ordered medication including Rocephin for pyelonephritis Reevaluation of the patient after these medicines showed that the patient: improved   Problem List / ED Course:  Pyelonephritis, positive blood cultures   Reevaluation:  After the interventions noted above, I reevaluated the patient and found that they have: stayed the same   Disposition:  After consideration of the  diagnostic results and the patients response to treatment, I feel that the patent would benefit from admission.          Final Clinical Impression(s) / ED Diagnoses Final diagnoses:  Pyelonephritis    Rx / DC Orders ED Discharge Orders     None         Valarie Merino, MD 03/27/22 1831

## 2022-03-27 NOTE — ED Provider Triage Note (Signed)
PatientEmergency Medicine Provider Triage Evaluation Note  Bridget Stevens , a 51 y.o. female  was evaluated in triage.  Pt complains of abnormal labs onset today.  She notes that she was called and told to come to the emergency department for IV antibiotics due to abnormal labs.  Recently evaluated in the emergency department and diagnosed with pyelonephritis and treated with prescription Vantin.  She has been compliant with the antibiotics.  Denies fever, chills, chest pain, shortness of breath.  Review of Systems  Positive: As per HPI above Negative:   Physical Exam  BP 114/62 (BP Location: Left Arm)   Pulse 88   Temp 98.9 F (37.2 C) (Oral)   Resp 18   Ht 4\' 10"  (1.473 m)   Wt 60.8 kg   SpO2 98%   BMI 28.01 kg/m  Gen:   Awake, no distress   Resp:  Normal effort  MSK:   Moves extremities without difficulty  Other:    Medical Decision Making  Medically screening exam initiated at 3:55 PM.  Appropriate orders placed.  Bridget Stevens was informed that the remainder of the evaluation will be completed by another provider, this initial triage assessment does not replace that evaluation, and the importance of remaining in the ED until their evaluation is complete.  Work-up initiated   Azuree Minish A, PA-C 03/27/22 1555

## 2022-03-27 NOTE — Assessment & Plan Note (Signed)
continue albuterol prn

## 2022-03-27 NOTE — H&P (Signed)
[name]:[id number] DOB: 08/08/1971 DOA: 03/27/2022     PCP: Lewanda Rife, PA      Patient arrived to ER on 03/27/22 at 1521 Referred by Attending Therisa Doyne, MD   Patient coming from:    home Lives    With  SO    Chief Complaint: Positive blood cultures   HPI: Bridget Stevens is a 51 y.o. female with medical history significant of GERD HLD gastric bypass tubal ligation    Presented with   positive blood cultures persistent fatigue Was seen in the emergency department on 22 March 2022 for pyelonephritis originally presents with 5 days of fever chills body aches flank pain.  Blood cultures and urine cultures were obtained at the time patient went home after work-up showed CT scan showing acute left-sided pyelonephritis no stone no mass noted although the common bile duct noted to be a enlarged to 9 mm which is chronic LFTs abdominal normal she was given 1 dose of ceftriaxone IV in the emergency department and discharged home on Vantin 200 mg 2 times a day she has been taking her medications Continued to have intermittent fevers.  Blood cultures turned out to be positive and patient was instructed to come back to emergency department She still has diffuse fatigue and abdominal discomfort no nausea no vomiting but does have decreased p.o. intake      Lab Results  Component Value Date   SARSCOV2NAA NEGATIVE 03/23/2022   SARSCOV2NAA NEGATIVE 11/27/2021   SARSCOV2NAA NEGATIVE 07/13/2020   SARSCOV2NAA POSITIVE (A) 06/09/2020     Regarding pertinent Chronic problems:     Hyperlipidemia -not on any statins Lipid Panel     Component Value Date/Time   CHOL 152 06/12/2021 0000   TRIG 85 06/12/2021 0000   HDL 56 06/12/2021 0000   CHOLHDL 2.7 06/12/2021 0000   LDLCALC 79 06/12/2021 0000         Asthma -well   controlled on home inhalers/ nebs      While in ER: Given a dose of Rocephin after discussion with ID who recommends admission reculturing blood  given IV antibiotics In ER febrile up to 101.3  Otherwise vitals are stable not meeting sepsis criteria    Following Medications were ordered in ER: Medications  cefTRIAXone (ROCEPHIN) 2 g in sodium chloride 0.9 % 100 mL IVPB (2 g Intravenous New Bag/Given 03/27/22 1833)  sodium chloride 0.9 % bolus 1,000 mL (1,000 mLs Intravenous New Bag/Given 03/27/22 1834)    _______________________________________________________ ER Provider Called:   ID Dr. Ilsa Iha They Recommend admit to medicine on IV Rocephin and observation repeat blood cultures     ED Triage Vitals  Enc Vitals Group     BP 03/27/22 1532 114/62     Pulse Rate 03/27/22 1532 88     Resp 03/27/22 1532 18     Temp 03/27/22 1532 98.9 F (37.2 C)     Temp Source 03/27/22 1532 Oral     SpO2 03/27/22 1532 98 %     Weight 03/27/22 1536 134 lb (60.8 kg)     Height 03/27/22 1536  (1.473 m)     Head Circumference --      Peak Flow --      Pain Score 03/27/22 1535 0     Pain Loc --      Pain Edu? --      Excl. in GC? --   TMAX(24)@     _________________________________________ Significant initial  Findings:  Abnormal Labs Reviewed  BASIC METABOLIC PANEL - Abnormal; Notable for the following components:      Result Value   Glucose, Bld 108 (*)    All other components within normal limits  CBC WITH DIFFERENTIAL/PLATELET - Abnormal; Notable for the following components:   RBC 3.57 (*)    Hemoglobin 11.5 (*)    HCT 35.2 (*)    Platelets 407 (*)    Abs Immature Granulocytes 0.08 (*)    All other components within normal limits      The recent clinical data is shown below. Vitals:   03/27/22 1536 03/27/22 1730 03/27/22 1800 03/27/22 1830  BP:  116/73 110/68 120/67  Pulse:  80 82 79  Resp:  16  15  Temp:      TempSrc:      SpO2:  100% 100% 98%  Weight: 60.8 kg     Height:  (1.473 m)         WBC     Component Value Date/Time   WBC 9.9 03/27/2022 1614   LYMPHSABS 1.5 03/27/2022 1614   MONOABS 0.8  03/27/2022 1614   EOSABS 0.1 03/27/2022 1614   BASOSABS 0.0 03/27/2022 1614    Lactic Acid, Venous    Component Value Date/Time   LATICACIDVEN 1.0 03/23/2022 0535      Results for orders placed or performed during the hospital encounter of 03/22/22  Resp Panel by RT-PCR (Flu A&B, Covid) Anterior Nasal Swab     Status: None   Collection Time: 03/23/22  5:21 AM   Specimen: Anterior Nasal Swab  Result Value Ref Range Status   SARS Coronavirus 2 by RT PCR NEGATIVE NEGATIVE Final         Influenza A by PCR NEGATIVE NEGATIVE Final   Influenza B by PCR NEGATIVE NEGATIVE Final        Blood culture (routine x 2)     Status: None (Preliminary result)   Collection Time: 03/23/22  5:35 AM   Specimen: BLOOD  Result Value Ref Range Status   Specimen Description BLOOD RIGHT ANTECUBITAL  Final   Special Requests   Final    BOTTLES DRAWN AEROBIC AND ANAEROBIC Blood Culture results may not be optimal due to an excessive volume of blood received in culture bottles   Culture  Setup Time   Final    GRAM NEGATIVE RODS ANAEROBIC BOTTLE ONLY CRITICAL RESULT CALLED TO, READ BACK BY AND VERIFIED WITH: RN Dorian Furnace 1304 [id number] FCP Performed at [facility], [address] [place], [place] [address]    Culture GRAM NEGATIVE RODS  Final   Report Status PENDING  Incomplete  Blood Culture ID Panel (Reflexed)     Status: Abnormal   Collection Time: 03/23/22  5:35 AM  Result Value Ref Range Status   Enterococcus faecalis NOT DETECTED NOT DETECTED Final   Enterococcus Faecium NOT DETECTED NOT DETECTED Final   Listeria monocytogenes NOT DETECTED NOT DETECTED Final   Staphylococcus species NOT DETECTED NOT DETECTED Final   Staphylococcus aureus (BCID) NOT DETECTED NOT DETECTED Final   Staphylococcus epidermidis NOT DETECTED NOT DETECTED Final   Staphylococcus lugdunensis NOT DETECTED NOT DETECTED Final   Streptococcus species NOT DETECTED NOT DETECTED Final   Streptococcus agalactiae NOT DETECTED NOT  DETECTED Final   Streptococcus pneumoniae NOT DETECTED NOT DETECTED Final   Streptococcus pyogenes NOT DETECTED NOT DETECTED Final   A.calcoaceticus-baumannii NOT DETECTED NOT DETECTED Final   Bacteroides fragilis NOT DETECTED NOT DETECTED Final  Enterobacterales DETECTED (A) NOT DETECTED Final    Comment: Enterobacterales represent a large order of gram negative bacteria, not a single organism. CRITICAL RESULT CALLED TO, READ BACK BY AND VERIFIED WITH: RN SOPHIE G 1304 [id number] FCP    Enterobacter cloacae complex NOT DETECTED NOT DETECTED Final   Escherichia coli DETECTED (A) NOT DETECTED Final    Comment: CRITICAL RESULT CALLED TO, READ BACK BY AND VERIFIED WITH: RN SOPHIE G 1304 [id number] FCP    Klebsiella aerogenes NOT DETECTED NOT DETECTED Final   Klebsiella oxytoca NOT DETECTED NOT DETECTED Final   Klebsiella pneumoniae NOT DETECTED NOT DETECTED Final   Proteus species NOT DETECTED NOT DETECTED Final   Salmonella species NOT DETECTED NOT DETECTED Final   Serratia marcescens NOT DETECTED NOT DETECTED Final   Haemophilus influenzae NOT DETECTED NOT DETECTED Final   Neisseria meningitidis NOT DETECTED NOT DETECTED Final   Pseudomonas aeruginosa NOT DETECTED NOT DETECTED Final   Stenotrophomonas maltophilia NOT DETECTED NOT DETECTED Final   Candida albicans NOT DETECTED NOT DETECTED Final   Candida auris NOT DETECTED NOT DETECTED Final   Candida glabrata NOT DETECTED NOT DETECTED Final   Candida krusei NOT DETECTED NOT DETECTED Final   Candida parapsilosis NOT DETECTED NOT DETECTED Final   Candida tropicalis NOT DETECTED NOT DETECTED Final   Cryptococcus neoformans/gattii NOT DETECTED NOT DETECTED Final   CTX-M ESBL NOT DETECTED NOT DETECTED Final   Carbapenem resistance IMP NOT DETECTED NOT DETECTED Final   Carbapenem resistance KPC NOT DETECTED NOT DETECTED Final   Carbapenem resistance NDM NOT DETECTED NOT DETECTED Final   Carbapenem resist OXA 48 LIKE NOT DETECTED NOT DETECTED  Final   Carbapenem resistance VIM NOT DETECTED NOT DETECTED Final    Comment: Performed at [facility], [address] [place], [place] [address]  Blood culture (routine x 2)     Status: None (Preliminary result)   Collection Time: 03/23/22  5:36 AM   Specimen: BLOOD  Result Value Ref Range Status   Specimen Description BLOOD LEFT ANTECUBITAL  Final   Special Requests   Final    BOTTLES DRAWN AEROBIC AND ANAEROBIC Blood Culture adequate volume   Culture   Final    NO GROWTH 4 DAYS Performed at [facility], [address], [place], [place] [address]    Report Status PENDING  Incomplete     _______________________________________________ Hospitalist was called for admission for bacteremia    The following Work up has been ordered so far:  Orders Placed This Encounter  Procedures   Blood culture (routine x 2)   Basic metabolic panel   CBC with Differential   Urinalysis, Routine w reflex microscopic   Hepatic function panel   Magnesium   Cardiac monitoring   Consult to infectious diseases  Pos blood cultures   Consult to hospitalist  Pos blood cx   Saline lock IV   Admit to Inpatient (patient's expected length of stay will be greater than 2 midnights or inpatient only procedure)     OTHER Significant initial  Findings:  labs showing:    Recent Labs  Lab 03/22/22 1919 03/27/22 1614  NA 136 144  K 3.2* 4.6  CO2 23 30  GLUCOSE 108* 108*  BUN 11 10  CREATININE 0.90 0.68  CALCIUM 8.8* 9.1    Cr   stable,    Lab Results  Component Value Date   CREATININE 0.68 03/27/2022   CREATININE 0.90 03/22/2022   CREATININE 0.71 11/27/2021  Recent Labs  Lab 03/22/22 1919  AST 29  ALT 30  ALKPHOS 119  BILITOT 1.0  PROT 6.7  ALBUMIN 3.1*   Lab Results  Component Value Date   CALCIUM 9.1 03/27/2022    Plt: Lab Results  Component Value Date   PLT 407 (H) 03/27/2022      COVID-19 Labs  No results for input(s): "DDIMER", "FERRITIN", "LDH",  "CRP" in the last 72 hours.  Lab Results  Component Value Date   SARSCOV2NAA NEGATIVE 03/23/2022   SARSCOV2NAA NEGATIVE 11/27/2021   SARSCOV2NAA NEGATIVE 07/13/2020   SARSCOV2NAA POSITIVE (A) 06/09/2020       Recent Labs  Lab 03/22/22 1919 03/27/22 1614  WBC 9.6 9.9  NEUTROABS 7.1 7.4  HGB 12.0 11.5*  HCT 36.7 35.2*  MCV 98.4 98.6  PLT 173 407*    HG/HCT   stable,      Component Value Date/Time   HGB 11.5 (L) 03/27/2022 1614   HCT 35.2 (L) 03/27/2022 1614   MCV 98.6 03/27/2022 1614      Recent Labs  Lab 03/22/22 1919  LIPASE 25        Cultures:    Component Value Date/Time   SDES BLOOD LEFT ANTECUBITAL 03/23/2022 0536   SPECREQUEST  03/23/2022 0536    BOTTLES DRAWN AEROBIC AND ANAEROBIC Blood Culture adequate volume   CULT  03/23/2022 0536    NO GROWTH 4 DAYS Performed at [facility], [address] [place], [place] [address]    REPTSTATUS PENDING 03/23/2022 6045     Radiological Exams on Admission: No results found. _______________________________________________________________________________________________________ Latest  Blood pressure 120/67, pulse 79, temperature 98.9 F (37.2 C), temperature source Oral, resp. rate 15, height  (1.473 m), weight 60.8 kg, SpO2 98 %.   Vitals  labs and radiology finding personally reviewed  Review of Systems:    Pertinent positives include:   chills, fatigue,  Constitutional:  No weight loss, night sweats, Fevers, weight loss  HEENT:  No headaches, Difficulty swallowing,Tooth/dental problems,Sore throat,  No sneezing, itching, ear ache, nasal congestion, post nasal drip,  Cardio-vascular:  No chest pain, Orthopnea, PND, anasarca, dizziness, palpitations.no Bilateral lower extremity swelling  GI:  No heartburn, indigestion, abdominal pain, nausea, vomiting, diarrhea, change in bowel habits, loss of appetite, melena, blood in stool, hematemesis Resp:  no shortness of breath at rest. No dyspnea  on exertion, No excess mucus, no productive cough, No non-productive cough, No coughing up of blood.No change in color of mucus.No wheezing. Skin:  no rash or lesions. No jaundice GU:  no dysuria, change in color of urine, no urgency or frequency. No straining to urinate.  No flank pain.  Musculoskeletal:  No joint pain or no joint swelling. No decreased range of motion. No back pain.  Psych:  No change in mood or affect. No depression or anxiety. No memory loss.  Neuro: no localizing neurological complaints, no tingling, no weakness, no double vision, no gait abnormality, no slurred speech, no confusion  All systems reviewed and apart from HOPI all are negative _______________________________________________________________________________________________ Past Medical History:   Past Medical History:  Diagnosis Date   Anxiety    Arthritis    Asthma    GERD (gastroesophageal reflux disease)    Headache    Hepatomegaly    Hx of migraines    Hyperlipidemia    Ovarian cyst    Thyroid disease       Past Surgical History:  Procedure Laterality Date   ANTERIOR CERVICAL  DECOMP/DISCECTOMY FUSION N/A 02/18/2020   Procedure: Anterior Cervical Decompression/Discectomy Fusion - Cervical five-Cervcal six;  Surgeon: Tia AlertJones, David S, MD;  [facility];  Service: Neurosurgery;  Laterality: N/A;   BACK SURGERY     BTL  07/30/2008   CARPAL TUNNEL RELEASE Right    CARPAL TUNNEL RELEASE Left 09/22/2016   Procedure: CARPAL TUNNEL RELEASE, left;  Surgeon: Dairl PonderMatthew Weingold, MD;  Location: [facility];  Service: Orthopedics;  Laterality: Left;   CESAREAN SECTION     DORSAL COMPARTMENT RELEASE Left 09/22/2016   Procedure: RELEASE DORSAL COMPARTMENT (DEQUERVAIN);  Surgeon: Dairl PonderMatthew Weingold, MD;  Location: [facility];  Service: Orthopedics;  Laterality: Left;   ELBOW SURGERY Right    gastric by pass     HYSTEROSCOPY W/ ENDOMETRIAL ABLATION     KNEE SURGERY Left     meniscus tear and a spur   TUBAL LIGATION     WISDOM TOOTH EXTRACTION      Social History:  Ambulatory   independently      reports that she has never smoked. She has never used smokeless tobacco. She reports that she does not currently use alcohol. She reports that she does not use drugs.     Family History:   Family History  Problem Relation Age of Onset   Diabetes Father    Hypertension Father    Hyperlipidemia Father    Thyroid disease Father    Asthma Father    Cancer Mother    Hypertension Mother    Thyroid disease Mother    Cancer Sister        CERVICAL   Thyroid disease Sister    Asthma Sister    Asthma Brother    Breast cancer Maternal Grandmother        in her 6080s   ______________________________________________________________________________________________ Allergies: Allergies  Allergen Reactions   Shellfish Allergy Anaphylaxis   Peanut-Containing Drug Products Swelling    SWELLING REACTION UNSPECIFIED    Sulfa Antibiotics Hives   Advil [Ibuprofen]     Due to gastric surgery   Percocet [Oxycodone-Acetaminophen] Other (See Comments)    PT STATES THAT SHE HAS HEADACHES WITH THIS MED     Prior to Admission medications   Medication Sig Start Date End Date Taking? Authorizing Provider  acetaminophen (TYLENOL) 500 MG tablet Take 1,000 mg by mouth every 6 (six) hours as needed for moderate pain.    Yes [provider]  albuterol (PROVENTIL HFA;VENTOLIN HFA) 108 (90 Base) MCG/ACT inhaler Inhale 2 puffs into the lungs every 4 (four) hours as needed for wheezing or shortness of breath.    Yes [provider]  Butalbital-Acetaminophen 50-300 MG TABS Take 1 tablet by mouth every 6 (six) hours as needed (migraines). 11/30/21  Yes [provider]  Calcium Carb-Cholecalciferol (QC CALCIUM 600 +D3 PO) Take 1 tablet by mouth daily.   Yes [provider]  calcium elemental as carbonate (BARIATRIC TUMS ULTRA) 400 MG chewable tablet  Chew 1,000-2,000 mg by mouth 3 (three) times daily as needed for heartburn (acid reflux/indigestion.).   Yes [provider]  cefpodoxime (VANTIN) 200 MG tablet Take 1 tablet (200 mg total) by mouth 2 (two) times daily. 03/23/22  Yes Petrucelli, Pleas KochSamantha R, PA-C  Cholecalciferol (VITAMIN D) 50 MCG (2000 UT) CAPS Take 2,000 Units by mouth every evening.    Yes [provider]  cycloSPORINE (RESTASIS) 0.05 % ophthalmic emulsion Place 1 drop into both eyes 2 (two) times daily as needed (dry/irritated eyes.).  Yes [provider]  EPINEPHrine 0.3 mg/0.3 mL IJ SOAJ injection Inject 0.3 mg into the muscle as needed for anaphylaxis. 11/21/19  Yes [provider]  famotidine (PEPCID) 40 MG tablet Take 40 mg by mouth at bedtime. 03/14/22  Yes [provider]  FLAREX 0.1 % ophthalmic suspension Apply 1 drop to eye 2 (two) times daily as needed (itching eyes). 02/19/22  Yes [provider]  gabapentin (NEURONTIN) 300 MG capsule Take 300 mg by mouth in the morning, at noon, and at bedtime.   Yes [provider]  omeprazole (PRILOSEC) 40 MG capsule Take 40 mg by mouth daily. 02/19/22  Yes [provider]  Pediatric Multiple Vit-C-FA (MULTIVITAMIN ANIMAL SHAPES, WITH CA/FA,) with C & FA chewable tablet Chew 1 tablet by mouth 2 (two) times daily.   Yes [provider]  polyethylene glycol (MIRALAX / GLYCOLAX) 17 g packet Take 17 g by mouth daily as needed (constipation.).   Yes [provider]  cephALEXin (KEFLEX) 500 MG capsule Take 1 capsule (500 mg total) by mouth 4 (four) times daily. Patient not taking: Reported on 03/27/2022 08/29/21   Al Decant, PA-C  COVID-19 mRNA bivalent vaccine, Pfizer, (PFIZER COVID-19 VAC BIVALENT) injection Inject into the muscle. 08/10/21   Judyann Munson, MD  methocarbamol (ROBAXIN) 500 MG tablet Take 500 mg by mouth in the morning and at bedtime.    [provider]  potassium  chloride SA (KLOR-CON M) 20 MEQ tablet Take 1 tablet (20 mEq total) by mouth daily. Patient not taking: Reported on 03/27/2022 03/23/22   Cherly Anderson, PA-C    ___________________________________________________________________________________________________ Physical Exam:    03/27/2022    6:30 PM 03/27/2022    6:00 PM 03/27/2022    5:30 PM  Vitals with BMI  Systolic 120 110 952  Diastolic 67 68 73  Pulse 79 82 80     1. General:  in No  Acute distress   Chronically ill   -appearing 2. Psychological: Alert and   Oriented 3. Head/ENT:  Dry Mucous Membranes                          Head Non traumatic, neck supple                          Poor Dentition 4. SKIN: decreased Skin turgor,  Skin clean Dry and intact no rash 5. Heart: Regular rate and rhythm no  Murmur, no Rub or gallop 6. Lungs: no wheezes or crackles   7. Abdomen: Soft,  non-tender, Non distended bowel sounds present 8. Lower extremities: no clubbing, cyanosis, no  edema 9. Neurologically Grossly intact, moving all 4 extremities equally   10. MSK: Normal range of motion    Chart has been reviewed  ______________________________________________________________________________________________  Assessment/Plan 51 y.o. female with medical history significant of GERD HLD gastric bypass tubal ligation  Admitted for  bacteremia    Present on Admission:  Bacteremia  GERD (gastroesophageal reflux disease)  Acute pyelonephritis  Asthma     Bacteremia Blood cultures grew Enterobacter oralis and E. Coli ID is awre rec rocephin IV Repeat blood cultures observe on tele  GERD (gastroesophageal reflux disease) Continue PPI  Acute pyelonephritis Recent CT done showing no abscess Will treat with IV antibiotics if symptoms persist consider  re imaging to evaluate for perinephric abscess   Asthma continue albuterol prn   Other plan as per orders.  DVT prophylaxis:  SCD    Code Status:    Code  Status: Prior FULL CODE  as per patient     I had personally discussed CODE STATUS with patient      Family Communication:   Family not at  Bedside    Disposition Plan:           To home once workup is complete and patient is stable   Following barriers for discharge:                                                           Afebrile, white count improving able to transition to PO antibiotics                            Will need consultants to evaluate patient prior to discharge                       Consults called:    ID aware  Admission status:  ED Disposition     ED Disposition  Admit   Condition  --   Comment  Hospital Area[facility] [[id number]]  Level of Care: Telemetry [5]  Admit to tele based on following criteria: Other see comments  Comments: bacteremia  May admit patient to [facility] or [facility] if equivalent level of care is available:: No  Covid Evaluation: Asymptomatic - no recent exposure (last 10 days) testing not required  Diagnosis: Bacteremia [790.7.ICD-9-CM]  Admitting Physician: Therisa Doyne [[id number]]  Attending Physician: Therisa Doyne [[id number]]  Estimated length of stay: past midnight tomorrow  Certification:: I certify this patient will need inpatient services for at least 2 midnights           inpatient     I Expect 2 midnight stay secondary to severity of patient's current illness need for inpatient interventions justified by the following:    Severe lab/radiological/exam abnormalities including:    bacteremia    That are currently affecting medical management.   I expect  patient to be hospitalized for 2 midnights requiring inpatient medical care.  Patient is at high risk for adverse outcome (such as loss of life or disability) if not treated.  Indication for inpatient stay as follows:  bacteremia   Need for IV antibiotics, IV fluids,     Level of care     tele  For 12H    Lab Results  Component  Value Date   SARSCOV2NAA NEGATIVE 03/23/2022    Bridget Stevens 03/27/2022, 7:46 PM    [facility]     after 2 AM please page floor coverage PA If 7AM-7PM, please contact the day team taking care of the patient using [email or web address]   Patient was evaluated in the context of the global COVID-19 pandemic, which necessitated consideration that the patient might be at risk for infection with the SARS-CoV-2 virus that causes COVID-19. Institutional protocols and algorithms that pertain to the evaluation of patients at risk for COVID-19 are in a state of rapid change based on information released by regulatory bodies including the CDC and federal and state organizations. These policies and algorithms were followed during the patient's care.

## 2022-03-27 NOTE — ED Triage Notes (Signed)
Patient reports she was told by [facility] to come to ed for abnormal labs. Patient unaware of what labs. Chart shows positive blood cultures from 03/23/22. Denies pain in triage.

## 2022-03-27 NOTE — Assessment & Plan Note (Signed)
Recent CT done showing no abscess Will treat with IV antibiotics if symptoms persist consider  re imaging to evaluate for perinephric abscess

## 2022-03-27 NOTE — Assessment & Plan Note (Signed)
Continue PPI ?

## 2022-03-28 ENCOUNTER — Encounter (HOSPITAL_COMMUNITY): Payer: Self-pay | Admitting: Internal Medicine

## 2022-03-28 DIAGNOSIS — R7881 Bacteremia: Secondary | ICD-10-CM | POA: Diagnosis not present

## 2022-03-28 LAB — COMPREHENSIVE METABOLIC PANEL
ALT: 20 U/L (ref 0–44)
AST: 18 U/L (ref 15–41)
Albumin: 2.7 g/dL — ABNORMAL LOW (ref 3.5–5.0)
Alkaline Phosphatase: 109 U/L (ref 38–126)
Anion gap: 7 (ref 5–15)
BUN: 8 mg/dL (ref 6–20)
CO2: 28 mmol/L (ref 22–32)
Calcium: 8.1 mg/dL — ABNORMAL LOW (ref 8.9–10.3)
Chloride: 107 mmol/L (ref 98–111)
Creatinine, Ser: 0.69 mg/dL (ref 0.44–1.00)
GFR, Estimated: >60 mL/min (ref >60)
Glucose, Bld: 99 mg/dL (ref 70–99)
Potassium: 3.7 mmol/L (ref 3.5–5.1)
Sodium: 142 mmol/L (ref 135–145)
Total Bilirubin: 0.3 mg/dL (ref 0.3–1.2)
Total Protein: 6.7 g/dL (ref 6.5–8.1)

## 2022-03-28 LAB — URINALYSIS, ROUTINE W REFLEX MICROSCOPIC
Bilirubin Urine: NEGATIVE
Glucose, UA: NEGATIVE mg/dL
Hgb urine dipstick: NEGATIVE
Ketones, ur: NEGATIVE mg/dL
Nitrite: NEGATIVE
Protein, ur: NEGATIVE mg/dL
Specific Gravity, Urine: 1.008 (ref 1.005–1.030)
pH: 6 (ref 5.0–8.0)

## 2022-03-28 LAB — CBC
HCT: 33 % — ABNORMAL LOW (ref 36.0–46.0)
Hemoglobin: 10.8 g/dL — ABNORMAL LOW (ref 12.0–15.0)
MCH: 32.1 pg (ref 26.0–34.0)
MCHC: 32.7 g/dL (ref 30.0–36.0)
MCV: 98.2 fL (ref 80.0–100.0)
Platelets: 383 10*3/uL (ref 150–400)
RBC: 3.36 MIL/uL — ABNORMAL LOW (ref 3.87–5.11)
RDW: 13.8 % (ref 11.5–15.5)
WBC: 8.3 10*3/uL (ref 4.0–10.5)
nRBC: 0 % (ref 0.0–0.2)

## 2022-03-28 LAB — CULTURE, BLOOD (ROUTINE X 2)
Culture: NO GROWTH
Special Requests: ADEQUATE

## 2022-03-28 LAB — MRSA NEXT GEN BY PCR, NASAL: MRSA by PCR Next Gen: NOT DETECTED

## 2022-03-28 LAB — HIV ANTIBODY (ROUTINE TESTING W REFLEX): HIV Screen 4th Generation wRfx: NONREACTIVE

## 2022-03-28 LAB — LACTIC ACID, PLASMA: Lactic Acid, Venous: 1.5 mmol/L (ref 0.5–1.9)

## 2022-03-28 LAB — MAGNESIUM: Magnesium: 2.3 mg/dL (ref 1.7–2.4)

## 2022-03-28 MED ORDER — SENNOSIDES-DOCUSATE SODIUM 8.6-50 MG PO TABS
2.0000 | ORAL_TABLET | Freq: Every day | ORAL | Status: DC
Start: 2022-03-28 — End: 2022-03-31
  Administered 2022-03-28 – 2022-03-30 (×3): 2 via ORAL
  Filled 2022-03-28 (×3): qty 2

## 2022-03-28 MED ORDER — HYDROMORPHONE HCL 1 MG/ML IJ SOLN
0.5000 mg | INTRAMUSCULAR | Status: DC | PRN
  Administered 2022-03-29 – 2022-03-30 (×4): 0.5 mg via INTRAVENOUS
  Filled 2022-03-28 (×4): qty 0.5

## 2022-03-28 MED ORDER — POLYETHYLENE GLYCOL 3350 17 G PO PACK: 17.0000 g | PACK | Freq: Every day | ORAL | Status: DC | PRN

## 2022-03-28 MED ORDER — OXYCODONE HCL 5 MG PO TABS
5.0000 mg | ORAL_TABLET | Freq: Four times a day (QID) | ORAL | Status: DC | PRN
  Administered 2022-03-28 – 2022-03-30 (×3): 5 mg via ORAL
  Filled 2022-03-28 (×3): qty 1

## 2022-03-28 MED ORDER — PANTOPRAZOLE SODIUM 40 MG PO TBEC
40.0000 mg | DELAYED_RELEASE_TABLET | Freq: Every day | ORAL | Status: DC
  Administered 2022-03-28 – 2022-03-30 (×3): 40 mg via ORAL
  Filled 2022-03-28 (×3): qty 1

## 2022-03-28 NOTE — Progress Notes (Signed)
Pt admitted to unit. Pt oriented to room and call bell. No fevers at this time. No complaints of pain.

## 2022-03-28 NOTE — Progress Notes (Signed)
PROGRESS NOTE  Bridget Stevens [id number] DOB: 27-Jan-1971 DOA: 03/27/2022 PCP: Karie Soda, PA  HPI/Recap of past 24 hours: Bridget Stevens is a 51 y.o. female with medical history significant of C5-C6, L4-L5 S1 spinal surgeries, GERD, HLD, gastric bypass tubal ligation who presented with positive blood cultures persistent fatigue, acute left-sided pyelonephritis seen on CT scan.  No acute musculoskeletal findings on CT scan.  Blood cultures turned out to be positive from previous visit from the ED, the patient was instructed to come back to emergency department.  Blood cultures obtained on 03/23/2022 grew E. coli, Enterobacterales, susceptibilities are pending.  03/28/2022: Patient was seen and examined at bedside.  She reports back pain.  Reviewed admission CT scan no acute findings from musculoskeletal perspective.  We will continue to closely monitor.  Assessment/Plan: Principal Problem:   Bacteremia Active Problems:   GERD (gastroesophageal reflux disease)   Acute pyelonephritis   Asthma  Enterobacterales//E. coli bacteremia, POA Blood cultures obtained on 03/23/2022. Likely from urinary source Findings consistent with acute left pyelonephritis on CT scan. MRSA screening test negative Follow blood cultures obtained on 03/27/2022  Acute left pyelonephritis, seen on CT scan Started on Rocephin 2 g daily, continue Follow sensitivities to narrow down antibiotics Analgesics as needed with bowel regimen  GERD Continue p.o. PPI, Protonix 40 mg daily  Chronic back pain/history of cervical spine/lumbar spine/S1 spinal surgery Closely monitor symptomatology in the setting of bacteremia No acute findings seen on contrast CT scan abdomen and pelvis The patient had a recent cervical spine MRI without contrast on 02/13/2022 without acute findings.  Acute blood loss anemia, dilutional? Presented with hemoglobin of 12, downtrending to 10.8 No overt bleeding Continue to monitor  H&H   Code Status: Full code  Family Communication: None at bedside  Disposition Plan: Likely will discharge home Tuesday morning   Consultants: None.  Procedures: None.  Antimicrobials: Rocephin 2 g daily  DVT prophylaxis: SCDs  Status is: Inpatient The patient requires at least 2 midnights for further evaluation and treatment of present condition.    Objective: Vitals:   03/28/22 0031 03/28/22 0342 03/28/22 0816 03/28/22 1308  BP: 111/68 (!) 110/57 123/63 113/76  Pulse: 86 91 82 85  Resp: 18 17 18 18   Temp: 98.3 F (36.8 C) 98.5 F (36.9 C) 98.8 F (37.1 C) 97.8 F (36.6 C)  TempSrc: Oral Oral Oral Oral  SpO2: 100% 98% 95% 98%  Weight:      Height:        Intake/Output Summary (Last 24 hours) at 03/28/2022 1452 Last data filed at 03/28/2022 0900 Gross per 24 hour  Intake 1440 ml  Output --  Net 1440 ml   Filed Weights   03/27/22 1536  Weight: 60.8 kg    Exam:  General: 51 y.o. year-old female well developed well nourished in no acute distress.  Alert and oriented x3. Cardiovascular: Regular rate and rhythm with no rubs or gallops.  No thyromegaly or JVD noted.   Respiratory: Clear to auscultation with no wheezes or rales. Good inspiratory effort. Abdomen: Soft nontender nondistended with normal bowel sounds x4 quadrants. Musculoskeletal: No lower extremity edema. 2/4 pulses in all 4 extremities.  Tenderness with palpation of cervical spine and lumbar spine areas. Skin: No ulcerative lesions noted or rashes, Psychiatry: Mood is appropriate for condition and setting   Data Reviewed: CBC: Recent Labs  Lab 03/22/22 1919 03/27/22 1614 03/28/22 0022  WBC 9.6 9.9 8.3  NEUTROABS 7.1 7.4  --  HGB 12.0 11.5* 10.8*  HCT 36.7 35.2* 33.0*  MCV 98.4 98.6 98.2  PLT 173 407* [id number]   Basic Metabolic Panel: Recent Labs  Lab 03/22/22 1919 03/27/22 1614 03/28/22 0022  NA 136 144 142  K 3.2* 4.6 3.7  CL 102 107 107  CO2 23 30 28   GLUCOSE 108* 108*  99  BUN 11 10 8   CREATININE 0.90 0.68 0.69  CALCIUM 8.8* 9.1 8.1*  MG  --  2.6* 2.3   GFR: Estimated Creatinine Clearance: 63.5 mL/min (by C-G formula based on SCr of 0.69 mg/dL). Liver Function Tests: Recent Labs  Lab 03/22/22 1919 03/27/22 1614 03/28/22 0022  AST 29 23 18   ALT 30 24 20   ALKPHOS 119 134* 109  BILITOT 1.0 0.4 0.3  PROT 6.7 7.5 6.7  ALBUMIN 3.1* 3.0* 2.7*   Recent Labs  Lab 03/22/22 1919  LIPASE 25   No results for input(s): "AMMONIA" in the last 168 hours. Coagulation Profile: No results for input(s): "INR", "PROTIME" in the last 168 hours. Cardiac Enzymes: No results for input(s): "CKTOTAL", "CKMB", "CKMBINDEX", "TROPONINI" in the last 168 hours. BNP (last 3 results) No results for input(s): "PROBNP" in the last 8760 hours. HbA1C: No results for input(s): "HGBA1C" in the last 72 hours. CBG: No results for input(s): "GLUCAP" in the last 168 hours. Lipid Profile: No results for input(s): "CHOL", "HDL", "LDLCALC", "TRIG", "CHOLHDL", "LDLDIRECT" in the last 72 hours. Thyroid Function Tests: No results for input(s): "TSH", "T4TOTAL", "FREET4", "T3FREE", "THYROIDAB" in the last 72 hours. Anemia Panel: No results for input(s): "VITAMINB12", "FOLATE", "FERRITIN", "TIBC", "IRON", "RETICCTPCT" in the last 72 hours. Urine analysis:    Component Value Date/Time   COLORURINE YELLOW 03/27/2022 2329   APPEARANCEUR HAZY (A) 03/27/2022 2329   LABSPEC 1.008 03/27/2022 2329   PHURINE 6.0 03/27/2022 2329   GLUCOSEU NEGATIVE 03/27/2022 2329   HGBUR NEGATIVE 03/27/2022 2329   BILIRUBINUR NEGATIVE 03/27/2022 2329   KETONESUR NEGATIVE 03/27/2022 2329   PROTEINUR NEGATIVE 03/27/2022 2329   UROBILINOGEN 0.2 10/29/2019 1233   NITRITE NEGATIVE 03/27/2022 2329   LEUKOCYTESUR LARGE (A) 03/27/2022 2329   Sepsis Labs: @LABRCNTIP (procalcitonin:4,lacticidven:4)  ) Recent Results (from the past 240 hour(s))  Resp Panel by RT-PCR (Flu A&B, Covid) Anterior Nasal Swab      Status: None   Collection Time: 03/23/22  5:21 AM   Specimen: Anterior Nasal Swab  Result Value Ref Range Status   SARS Coronavirus 2 by RT PCR NEGATIVE NEGATIVE Final    Comment: (NOTE) SARS-CoV-2 target nucleic acids are NOT DETECTED.  The SARS-CoV-2 RNA is generally detectable in upper respiratory specimens during the acute phase of infection. The lowest concentration of SARS-CoV-2 viral copies this assay can detect is 138 copies/mL. A negative result does not preclude SARS-Cov-2 infection and should not be used as the sole basis for treatment or other patient management decisions. A negative result may occur with  improper specimen collection/handling, submission of specimen other than nasopharyngeal swab, presence of viral mutation(s) within the areas targeted by this assay, and inadequate number of viral copies(<138 copies/mL). A negative result must be combined with clinical observations, patient history, and epidemiological information. The expected result is Negative.  Fact Sheet for Patients:  EntrepreneurPulse.com.au  Fact Sheet for Healthcare Providers:  IncredibleEmployment.be  This test is no t yet approved or cleared by the [place] FDA and  has been authorized for detection and/or diagnosis of SARS-CoV-2 by FDA under an Emergency Use Authorization (EUA). This EUA will remain  in effect (meaning this test can be used) for the duration of the COVID-19 declaration under Section 564(b)(1) of the Act, 21 U.S.C.section 360bbb-3(b)(1), unless the authorization is terminated  or revoked sooner.       Influenza A by PCR NEGATIVE NEGATIVE Final   Influenza B by PCR NEGATIVE NEGATIVE Final    Comment: (NOTE) The Xpert Xpress SARS-CoV-2/FLU/RSV plus assay is intended as an aid in the diagnosis of influenza from Nasopharyngeal swab specimens and should not be used as a sole basis for treatment. Nasal washings and aspirates are  unacceptable for Xpert Xpress SARS-CoV-2/FLU/RSV testing.  Fact Sheet for Patients: EntrepreneurPulse.com.au  Fact Sheet for Healthcare Providers: IncredibleEmployment.be  This test is not yet approved or cleared by the [place] FDA and has been authorized for detection and/or diagnosis of SARS-CoV-2 by FDA under an Emergency Use Authorization (EUA). This EUA will remain in effect (meaning this test can be used) for the duration of the COVID-19 declaration under Section 564(b)(1) of the Act, 21 U.S.C. section 360bbb-3(b)(1), unless the authorization is terminated or revoked.  Performed at [facility], [place] [address], [place], [place] [address]   Blood culture (routine x 2)     Status: Abnormal (Preliminary result)   Collection Time: 03/23/22  5:35 AM   Specimen: BLOOD  Result Value Ref Range Status   Specimen Description BLOOD RIGHT ANTECUBITAL  Final   Special Requests   Final    BOTTLES DRAWN AEROBIC AND ANAEROBIC Blood Culture results may not be optimal due to an excessive volume of blood received in culture bottles   Culture  Setup Time   Final    GRAM NEGATIVE RODS ANAEROBIC BOTTLE ONLY CRITICAL RESULT CALLED TO, READ BACK BY AND VERIFIED WITH: RN SOPHIE G 1304 [id number] FCP    Culture (A)  Final    ESCHERICHIA COLI SUSCEPTIBILITIES TO FOLLOW Performed at [facility], [place] [address] [place], [place] [address]    Report Status PENDING  Incomplete  Blood Culture ID Panel (Reflexed)     Status: Abnormal   Collection Time: 03/23/22  5:35 AM  Result Value Ref Range Status   Enterococcus faecalis NOT DETECTED NOT DETECTED Final   Enterococcus Faecium NOT DETECTED NOT DETECTED Final   Listeria monocytogenes NOT DETECTED NOT DETECTED Final   Staphylococcus species NOT DETECTED NOT DETECTED Final   Staphylococcus aureus (BCID) NOT DETECTED NOT DETECTED Final   Staphylococcus epidermidis NOT DETECTED NOT DETECTED Final    Staphylococcus lugdunensis NOT DETECTED NOT DETECTED Final   Streptococcus species NOT DETECTED NOT DETECTED Final   Streptococcus agalactiae NOT DETECTED NOT DETECTED Final   Streptococcus pneumoniae NOT DETECTED NOT DETECTED Final   Streptococcus pyogenes NOT DETECTED NOT DETECTED Final   A.calcoaceticus-baumannii NOT DETECTED NOT DETECTED Final   Bacteroides fragilis NOT DETECTED NOT DETECTED Final   Enterobacterales DETECTED (A) NOT DETECTED Final    Comment: Enterobacterales represent a large order of gram negative bacteria, not a single organism. CRITICAL RESULT CALLED TO, READ BACK BY AND VERIFIED WITH: RN SOPHIE G 1304 [id number] FCP    Enterobacter cloacae complex NOT DETECTED NOT DETECTED Final   Escherichia coli DETECTED (A) NOT DETECTED Final    Comment: CRITICAL RESULT CALLED TO, READ BACK BY AND VERIFIED WITH: RN SOPHIE G 1304 [id number] FCP    Klebsiella aerogenes NOT DETECTED NOT DETECTED Final   Klebsiella oxytoca NOT DETECTED NOT DETECTED Final   Klebsiella pneumoniae NOT DETECTED NOT DETECTED Final   Proteus species  NOT DETECTED NOT DETECTED Final   Salmonella species NOT DETECTED NOT DETECTED Final   Serratia marcescens NOT DETECTED NOT DETECTED Final   Haemophilus influenzae NOT DETECTED NOT DETECTED Final   Neisseria meningitidis NOT DETECTED NOT DETECTED Final   Pseudomonas aeruginosa NOT DETECTED NOT DETECTED Final   Stenotrophomonas maltophilia NOT DETECTED NOT DETECTED Final   Candida albicans NOT DETECTED NOT DETECTED Final   Candida auris NOT DETECTED NOT DETECTED Final   Candida glabrata NOT DETECTED NOT DETECTED Final   Candida krusei NOT DETECTED NOT DETECTED Final   Candida parapsilosis NOT DETECTED NOT DETECTED Final   Candida tropicalis NOT DETECTED NOT DETECTED Final   Cryptococcus neoformans/gattii NOT DETECTED NOT DETECTED Final   CTX-M ESBL NOT DETECTED NOT DETECTED Final   Carbapenem resistance IMP NOT DETECTED NOT DETECTED Final   Carbapenem  resistance KPC NOT DETECTED NOT DETECTED Final   Carbapenem resistance NDM NOT DETECTED NOT DETECTED Final   Carbapenem resist OXA 48 LIKE NOT DETECTED NOT DETECTED Final   Carbapenem resistance VIM NOT DETECTED NOT DETECTED Final    Comment: Performed at [facility], [address], [place], [place] [address]  Blood culture (routine x 2)     Status: None (Preliminary result)   Collection Time: 03/23/22  5:36 AM   Specimen: BLOOD  Result Value Ref Range Status   Specimen Description BLOOD LEFT ANTECUBITAL  Final   Special Requests   Final    BOTTLES DRAWN AEROBIC AND ANAEROBIC Blood Culture adequate volume   Culture   Final    NO GROWTH 4 DAYS Performed at [facility], [place] [address] [place], [place] [address]    Report Status PENDING  Incomplete  MRSA Next Gen by PCR, Nasal     Status: None   Collection Time: 03/27/22 11:29 PM   Specimen: Nasal Swab  Result Value Ref Range Status   MRSA by PCR Next Gen NOT DETECTED NOT DETECTED Final    Comment: (NOTE) The GeneXpert MRSA Assay (FDA approved for NASAL specimens only), is one component of a comprehensive MRSA colonization surveillance program. It is not intended to diagnose MRSA infection nor to guide or monitor treatment for MRSA infections. Test performance is not FDA approved in patients less than 56 years old. Performed at [facility], [place] [address], [place], [place] [address]       Studies: No results found.  Scheduled Meds:  gabapentin  300 mg Oral TID   pantoprazole  40 mg Oral QHS    Continuous Infusions:  cefTRIAXone (ROCEPHIN)  IV       LOS: 1 day     Kayleen Memos, MD [facility] Pager [phone]  If 7PM-7AM, please contact night-coverage [email or web address] Password [facility] 03/28/2022, 2:52 PM

## 2022-03-29 DIAGNOSIS — R7881 Bacteremia: Secondary | ICD-10-CM | POA: Diagnosis not present

## 2022-03-29 LAB — CULTURE, BLOOD (ROUTINE X 2)

## 2022-03-29 NOTE — Progress Notes (Signed)
PROGRESS NOTE  Bridget SineCarolyn S Hubka ONG:[id number] DOB: 08/28/1971 DOA: 03/27/2022 PCP: Lewanda RifeStokes, [place], PA  HPI/Recap of past 24 hours: Bridget Stevens is a 51 y.o. female with medical history significant of C5-C6, L4-L5 S1 spinal surgeries, GERD, HLD, gastric bypass tubal ligation who presented with positive blood cultures persistent fatigue, acute left-sided pyelonephritis seen on CT scan.  No acute musculoskeletal findings on CT scan.  Blood cultures turned out to be positive from previous visit from the ED, the patient was instructed to come back to emergency department.  Blood cultures obtained on 03/23/2022 grew E. coli, Enterobacterales, susceptibilities are pending.  03/29/2022: Hurts all over.  Has night sweats  Assessment/Plan: Principal Problem:   Bacteremia Active Problems:   GERD (gastroesophageal reflux disease)   Acute pyelonephritis   Asthma  Enterobacterales//E. coli bacteremia, POA Blood cultures obtained on 03/23/2022. Likely from urinary source Findings consistent with acute left pyelonephritis on CT scan. MRSA screening test negative Follow blood cultures obtained on 03/27/2022  Acute left pyelonephritis, seen on CT scan Started on Rocephin 2 g daily, continue Follow sensitivities to narrow down antibiotics Analgesics as needed with bowel regimen  GERD Continue p.o. PPI, Protonix 40 mg daily  Chronic back pain/history of cervical spine/lumbar spine/S1 spinal surgery Closely monitor symptomatology in the setting of bacteremia No acute findings seen on contrast CT scan abdomen and pelvis The patient had a recent cervical spine MRI without contrast on 02/13/2022 without acute findings.  Acute blood loss anemia, dilutional? Presented with hemoglobin of 12, downtrending to 10.8 No overt bleeding Continue to monitor H&H   Code Status: Full code  Family Communication: None at bedside  Disposition Plan: Likely will discharge home Tuesday  morning   Consultants: None.  Procedures: None.  Antimicrobials: Rocephin 2 g daily  DVT prophylaxis: SCDs  Status is: Inpatient The patient requires at least 2 midnights for further evaluation and treatment of present condition.    Objective: Vitals:   03/28/22 1936 03/29/22 0345 03/29/22 0637 03/29/22 1331  BP: 112/62 108/66  112/67  Pulse: 88 76  82  Resp: 18 17  15   Temp: 100 F (37.8 C) 97.7 F (36.5 C) 100.3 F (37.9 C) 97.9 F (36.6 C)  TempSrc: Oral Oral    SpO2: 100% 100%  100%  Weight:      Height:        Intake/Output Summary (Last 24 hours) at 03/29/2022 1731 Last data filed at 03/29/2022 1300 [place] per 24 hour  Intake 820 ml  Output --  Net 820 ml   Filed Weights   03/27/22 1536  Weight: 60.8 kg    Exam:  No significant changes  General: 51 y.o. year-old female well developed well nourished in no acute distress.  Alert and oriented x3. Cardiovascular: Regular rate and rhythm with no rubs or gallops.  No thyromegaly or JVD noted.   Respiratory: Clear to auscultation with no wheezes or rales. Good inspiratory effort. Abdomen: Soft nontender nondistended with normal bowel sounds x4 quadrants. Musculoskeletal: No lower extremity edema. 2/4 pulses in all 4 extremities.  Tenderness with palpation of cervical spine and lumbar spine areas. Skin: No ulcerative lesions noted or rashes, Psychiatry: Mood is appropriate for condition and setting   Data Reviewed: CBC: Recent Labs  Lab 03/22/22 1919 03/27/22 1614 03/28/22 0022  WBC 9.6 9.9 8.3  NEUTROABS 7.1 7.4  --   HGB 12.0 11.5* 10.8*  HCT 36.7 35.2* 33.0*  MCV 98.4 98.6 98.2  PLT 173 407* 383  Basic Metabolic Panel: Recent Labs  Lab 03/22/22 1919 03/27/22 1614 03/28/22 0022  NA 136 144 142  K 3.2* 4.6 3.7  CL 102 107 107  CO2 GLUCOSE 108* 108* 99  BUN CREATININE 0.90 0.68 0.69  CALCIUM 8.8* 9.1 8.1*  MG  --  2.6* 2.3   GFR: Estimated Creatinine Clearance:  63.5 mL/min (by C-G formula based on SCr of 0.69 mg/dL). Liver Function Tests: Recent Labs  Lab 03/22/22 1919 03/27/22 1614 03/28/22 0022  AST ALT ALKPHOS 119 134* 109  BILITOT 1.0 0.4 0.3  PROT 6.7 7.5 6.7  ALBUMIN 3.1* 3.0* 2.7*   Recent Labs  Lab 03/22/22 1919  LIPASE 25   No results for input(s): "AMMONIA" in the last 168 hours. Coagulation Profile: No results for input(s): "INR", "PROTIME" in the last 168 hours. Cardiac Enzymes: No results for input(s): "CKTOTAL", "CKMB", "CKMBINDEX", "TROPONINI" in the last 168 hours. BNP (last 3 results) No results for input(s): "PROBNP" in the last 8760 hours. HbA1C: No results for input(s): "HGBA1C" in the last 72 hours. CBG: No results for input(s): "GLUCAP" in the last 168 hours. Lipid Profile: No results for input(s): "CHOL", "HDL", "LDLCALC", "TRIG", "CHOLHDL", "LDLDIRECT" in the last 72 hours. Thyroid Function Tests: No results for input(s): "TSH", "T4TOTAL", "FREET4", "T3FREE", "THYROIDAB" in the last 72 hours. Anemia Panel: No results for input(s): "VITAMINB12", "FOLATE", "FERRITIN", "TIBC", "IRON", "RETICCTPCT" in the last 72 hours. Urine analysis:    Component Value Date/Time   COLORURINE YELLOW 03/27/2022 2329   APPEARANCEUR HAZY (A) 03/27/2022 2329   LABSPEC 1.008 03/27/2022 2329   PHURINE 6.0 03/27/2022 2329   GLUCOSEU NEGATIVE 03/27/2022 2329   HGBUR NEGATIVE 03/27/2022 2329   BILIRUBINUR NEGATIVE 03/27/2022 2329   KETONESUR NEGATIVE 03/27/2022 2329   PROTEINUR NEGATIVE 03/27/2022 2329   UROBILINOGEN 0.2 10/29/2019 1233   NITRITE NEGATIVE 03/27/2022 2329   LEUKOCYTESUR LARGE (A) 03/27/2022 2329   Sepsis Labs: (procalcitonin:4,lacticidven:4)  ) Recent Results (from the past 240 hour(s))  Resp Panel by RT-PCR (Flu A&B, Covid) Anterior Nasal Swab     Status: None   Collection Time: 03/23/22  5:21 AM   Specimen: Anterior Nasal Swab  Result Value Ref Range Status   SARS  Coronavirus 2 by RT PCR NEGATIVE NEGATIVE Final    Comment: (NOTE) SARS-CoV-2 target nucleic acids are NOT DETECTED.  The SARS-CoV-2 RNA is generally detectable in upper respiratory specimens during the acute phase of infection. The lowest concentration of SARS-CoV-2 viral copies this assay can detect is 138 copies/mL. A negative result does not preclude SARS-Cov-2 infection and should not be used as the sole basis for treatment or other patient management decisions. A negative result may occur with  improper specimen collection/handling, submission of specimen other than nasopharyngeal swab, presence of viral mutation(s) within the areas targeted by this assay, and inadequate number of viral copies(<138 copies/mL). A negative result must be combined with clinical observations, patient history, and epidemiological information. The expected result is Negative.  Fact Sheet for Patients:  BloggerCourse.com  Fact Sheet for Healthcare Providers:  SeriousBroker.it  This test is no t yet approved or cleared by the [place] FDA and  has been authorized for detection and/or diagnosis of SARS-CoV-2 by FDA under an Emergency Use Authorization (EUA). This EUA will remain  in effect (meaning this test can be used) for the duration of the COVID-19 declaration under Section 564(b)(1) of the Act,  21 U.S.C.section 360bbb-3(b)(1), unless the authorization is terminated  or revoked sooner.       Influenza A by PCR NEGATIVE NEGATIVE Final   Influenza B by PCR NEGATIVE NEGATIVE Final    Comment: (NOTE) The Xpert Xpress SARS-CoV-2/FLU/RSV plus assay is intended as an aid in the diagnosis of influenza from Nasopharyngeal swab specimens and should not be used as a sole basis for treatment. Nasal washings and aspirates are unacceptable for Xpert Xpress SARS-CoV-2/FLU/RSV testing.  Fact Sheet for  Patients: BloggerCourse.com  Fact Sheet for Healthcare Providers: SeriousBroker.it  This test is not yet approved or cleared by the [place] FDA and has been authorized for detection and/or diagnosis of SARS-CoV-2 by FDA under an Emergency Use Authorization (EUA). This EUA will remain in effect (meaning this test can be used) for the duration of the COVID-19 declaration under Section 564(b)(1) of the Act, 21 U.S.C. section 360bbb-3(b)(1), unless the authorization is terminated or revoked.  Performed at [facility], [address], [place], [place] [address]   Blood culture (routine x 2)     Status: Abnormal   Collection Time: 03/23/22  5:35 AM   Specimen: BLOOD  Result Value Ref Range Status   Specimen Description BLOOD RIGHT ANTECUBITAL  Final   Special Requests   Final    BOTTLES DRAWN AEROBIC AND ANAEROBIC Blood Culture results may not be optimal due to an excessive volume of blood received in culture bottles   Culture  Setup Time   Final    GRAM NEGATIVE RODS ANAEROBIC BOTTLE ONLY CRITICAL RESULT CALLED TO, READ BACK BY AND VERIFIED WITH: RN Dorian Furnace 1304 [id number] FCP Performed at [facility], [address] [place], [place] [address]    Culture ESCHERICHIA COLI (A)  Final   Report Status 03/29/2022 FINAL  Final   Organism ID, Bacteria ESCHERICHIA COLI  Final      Susceptibility   Escherichia coli - MIC*    AMPICILLIN <=2 SENSITIVE Sensitive     CEFAZOLIN <=4 SENSITIVE Sensitive     CEFEPIME <=0.12 SENSITIVE Sensitive     CEFTAZIDIME <=1 SENSITIVE Sensitive     CEFTRIAXONE <=0.25 SENSITIVE Sensitive     CIPROFLOXACIN <=0.25 SENSITIVE Sensitive     GENTAMICIN <=1 SENSITIVE Sensitive     IMIPENEM <=0.25 SENSITIVE Sensitive     TRIMETH/SULFA <=20 SENSITIVE Sensitive     AMPICILLIN/SULBACTAM <=2 SENSITIVE Sensitive     PIP/TAZO <=4 SENSITIVE Sensitive     * ESCHERICHIA COLI  Blood Culture ID Panel  (Reflexed)     Status: Abnormal   Collection Time: 03/23/22  5:35 AM  Result Value Ref Range Status   Enterococcus faecalis NOT DETECTED NOT DETECTED Final   Enterococcus Faecium NOT DETECTED NOT DETECTED Final   Listeria monocytogenes NOT DETECTED NOT DETECTED Final   Staphylococcus species NOT DETECTED NOT DETECTED Final   Staphylococcus aureus (BCID) NOT DETECTED NOT DETECTED Final   Staphylococcus epidermidis NOT DETECTED NOT DETECTED Final   Staphylococcus lugdunensis NOT DETECTED NOT DETECTED Final   Streptococcus species NOT DETECTED NOT DETECTED Final   Streptococcus agalactiae NOT DETECTED NOT DETECTED Final   Streptococcus pneumoniae NOT DETECTED NOT DETECTED Final   Streptococcus pyogenes NOT DETECTED NOT DETECTED Final   A.calcoaceticus-baumannii NOT DETECTED NOT DETECTED Final   Bacteroides fragilis NOT DETECTED NOT DETECTED Final   Enterobacterales DETECTED (A) NOT DETECTED Final    Comment: Enterobacterales represent a large order of gram negative bacteria, not a single organism. CRITICAL RESULT  CALLED TO, READ BACK BY AND VERIFIED WITH: RN SOPHIE G 1304 [id number] FCP    Enterobacter cloacae complex NOT DETECTED NOT DETECTED Final   Escherichia coli DETECTED (A) NOT DETECTED Final    Comment: CRITICAL RESULT CALLED TO, READ BACK BY AND VERIFIED WITH: RN SOPHIE G 1304 [id number] FCP    Klebsiella aerogenes NOT DETECTED NOT DETECTED Final   Klebsiella oxytoca NOT DETECTED NOT DETECTED Final   Klebsiella pneumoniae NOT DETECTED NOT DETECTED Final   Proteus species NOT DETECTED NOT DETECTED Final   Salmonella species NOT DETECTED NOT DETECTED Final   Serratia marcescens NOT DETECTED NOT DETECTED Final   Haemophilus influenzae NOT DETECTED NOT DETECTED Final   Neisseria meningitidis NOT DETECTED NOT DETECTED Final   Pseudomonas aeruginosa NOT DETECTED NOT DETECTED Final   Stenotrophomonas maltophilia NOT DETECTED NOT DETECTED Final   Candida albicans NOT DETECTED NOT DETECTED  Final   Candida auris NOT DETECTED NOT DETECTED Final   Candida glabrata NOT DETECTED NOT DETECTED Final   Candida krusei NOT DETECTED NOT DETECTED Final   Candida parapsilosis NOT DETECTED NOT DETECTED Final   Candida tropicalis NOT DETECTED NOT DETECTED Final   Cryptococcus neoformans/gattii NOT DETECTED NOT DETECTED Final   CTX-M ESBL NOT DETECTED NOT DETECTED Final   Carbapenem resistance IMP NOT DETECTED NOT DETECTED Final   Carbapenem resistance KPC NOT DETECTED NOT DETECTED Final   Carbapenem resistance NDM NOT DETECTED NOT DETECTED Final   Carbapenem resist OXA 48 LIKE NOT DETECTED NOT DETECTED Final   Carbapenem resistance VIM NOT DETECTED NOT DETECTED Final    Comment: Performed at [facility], [address] [place], [place] [address]  Blood culture (routine x 2)     Status: None   Collection Time: 03/23/22  5:36 AM   Specimen: BLOOD  Result Value Ref Range Status   Specimen Description BLOOD LEFT ANTECUBITAL  Final   Special Requests   Final    BOTTLES DRAWN AEROBIC AND ANAEROBIC Blood Culture adequate volume   Culture   Final    NO GROWTH 5 DAYS Performed at [facility], [address] [place], [place] [address]    Report Status 03/28/2022 FINAL  Final  Blood culture (routine x 2)     Status: None (Preliminary result)   Collection Time: 03/27/22  4:15 PM   Specimen: BLOOD  Result Value Ref Range Status   Specimen Description   Final    BLOOD RIGHT ANTECUBITAL Performed at [facility], [address], [place], [place] [address]    Special Requests   Final    BOTTLES DRAWN AEROBIC AND ANAEROBIC Blood Culture results may not be optimal due to an excessive volume of blood received in culture bottles Performed at [facility], [address], [place], [place] [address]    Culture   Final    NO GROWTH 2 DAYS Performed at [facility], [address], [place], [place] [address]    Report Status PENDING   Incomplete  Blood culture (routine x 2)     Status: None (Preliminary result)   Collection Time: 03/27/22  5:24 PM   Specimen: BLOOD  Result Value Ref Range Status   Specimen Description   Final    BLOOD SITE NOT SPECIFIED Performed at [facility], [address], [place], [place] [address]    Special Requests   Final    BOTTLES DRAWN AEROBIC AND ANAEROBIC Blood Culture adequate volume  Performed at [facility], [address], [place], [place] [address]    Culture   Final    NO GROWTH 2 DAYS Performed at [facility], [address] [place], [place] [address]    Report Status PENDING  Incomplete  MRSA Next Gen by PCR, Nasal     Status: None   Collection Time: 03/27/22 11:29 PM   Specimen: Nasal Swab  Result Value Ref Range Status   MRSA by PCR Next Gen NOT DETECTED NOT DETECTED Final    Comment: (NOTE) The GeneXpert MRSA Assay (FDA approved for NASAL specimens only), is one component of a comprehensive MRSA colonization surveillance program. It is not intended to diagnose MRSA infection nor to guide or monitor treatment for MRSA infections. Test performance is not FDA approved in patients less than 33 years old. Performed at [facility], [address], [place], [place] [address]       Studies: No results found.  Scheduled Meds:  gabapentin  300 mg Oral TID   pantoprazole  40 mg Oral QHS   senna-docusate  2 tablet Oral QHS    Continuous Infusions:  cefTRIAXone (ROCEPHIN)  IV 2 g (03/28/22 1707)     LOS: 2 days     Darlin Drop, MD [facility] Pager [phone]  If 7PM-7AM, please contact night-coverage [email or web address] Password [facility] 03/29/2022, 5:31 PM

## 2022-03-29 NOTE — Progress Notes (Signed)
  Transition of Care [facility]) Screening Note   Patient Details  Name: Bridget Stevens Date of Birth: November 04, 1970   Transition of [facility]) CM/SW Contact:    Otelia Santee, LCSW Phone Number: 03/29/2022, 11:03 AM    Transition of Care Department [facility]) has reviewed patient and no TOC needs have been identified at this time. We will continue to monitor patient advancement through interdisciplinary progression rounds. If new patient transition needs arise, please place a TOC consult.

## 2022-03-30 ENCOUNTER — Encounter (HOSPITAL_COMMUNITY): Payer: Self-pay | Admitting: Internal Medicine

## 2022-03-30 DIAGNOSIS — R7881 Bacteremia: Secondary | ICD-10-CM | POA: Diagnosis not present

## 2022-03-30 DIAGNOSIS — Z9884 Bariatric surgery status: Secondary | ICD-10-CM

## 2022-03-30 DIAGNOSIS — B9620 Unspecified Escherichia coli [E. coli] as the cause of diseases classified elsewhere: Secondary | ICD-10-CM

## 2022-03-30 LAB — CBC WITH DIFFERENTIAL/PLATELET
Abs Immature Granulocytes: 0.07 10*3/uL (ref 0.00–0.07)
Basophils Absolute: 0 10*3/uL (ref 0.0–0.1)
Basophils Relative: 0 %
Eosinophils Absolute: 0.1 10*3/uL (ref 0.0–0.5)
Eosinophils Relative: 1 %
HCT: 36.8 % (ref 36.0–46.0)
Hemoglobin: 12.1 g/dL (ref 12.0–15.0)
Immature Granulocytes: 1 %
Lymphocytes Relative: 16 %
Lymphs Abs: 1.6 10*3/uL (ref 0.7–4.0)
MCH: 32.3 pg (ref 26.0–34.0)
MCHC: 32.9 g/dL (ref 30.0–36.0)
MCV: 98.1 fL (ref 80.0–100.0)
Monocytes Absolute: 1 10*3/uL (ref 0.1–1.0)
Monocytes Relative: 10 %
Neutro Abs: 7.6 10*3/uL (ref 1.7–7.7)
Neutrophils Relative %: 72 %
Platelets: 523 10*3/uL — ABNORMAL HIGH (ref 150–400)
RBC: 3.75 MIL/uL — ABNORMAL LOW (ref 3.87–5.11)
RDW: 13 % (ref 11.5–15.5)
WBC: 10.4 10*3/uL (ref 4.0–10.5)
nRBC: 0 % (ref 0.0–0.2)

## 2022-03-30 LAB — COMPREHENSIVE METABOLIC PANEL
ALT: 66 U/L — ABNORMAL HIGH (ref 0–44)
AST: 61 U/L — ABNORMAL HIGH (ref 15–41)
Albumin: 3 g/dL — ABNORMAL LOW (ref 3.5–5.0)
Alkaline Phosphatase: 196 U/L — ABNORMAL HIGH (ref 38–126)
Anion gap: 10 (ref 5–15)
BUN: 14 mg/dL (ref 6–20)
CO2: 31 mmol/L (ref 22–32)
Calcium: 9 mg/dL (ref 8.9–10.3)
Chloride: 100 mmol/L (ref 98–111)
Creatinine, Ser: 0.74 mg/dL (ref 0.44–1.00)
GFR, Estimated: >60 mL/min (ref >60)
Glucose, Bld: 132 mg/dL — ABNORMAL HIGH (ref 70–99)
Potassium: 4.2 mmol/L (ref 3.5–5.1)
Sodium: 141 mmol/L (ref 135–145)
Total Bilirubin: 0.4 mg/dL (ref 0.3–1.2)
Total Protein: 7.4 g/dL (ref 6.5–8.1)

## 2022-03-30 LAB — PHOSPHORUS: Phosphorus: 4.3 mg/dL (ref 2.5–4.6)

## 2022-03-30 LAB — MAGNESIUM: Magnesium: 2.3 mg/dL (ref 1.7–2.4)

## 2022-03-30 MED ORDER — CEFADROXIL 500 MG PO CAPS
1000.0000 mg | ORAL_CAPSULE | Freq: Two times a day (BID) | ORAL | Status: DC
  Administered 2022-03-30 – 2022-03-31 (×2): 1000 mg via ORAL
  Filled 2022-03-30 (×2): qty 2

## 2022-03-30 NOTE — Progress Notes (Signed)
PROGRESS NOTE  Bridget Stevens TFT:[id number] DOB: 18-Nov-1970 DOA: 03/27/2022 PCP: Lewanda Rife, PA  HPI/Recap of past 24 hours: Bridget Stevens is a 51 y.o. female with medical history significant of C5-C6, L4-L5 S1 spinal surgeries, GERD, HLD, gastric bypass tubal ligation who presented with positive blood cultures, persistent fatigue, sweats, acute left-sided pyelonephritis seen on CT scan.  No acute musculoskeletal findings on CT scan.  Blood cultures obtained on 03/23/2022 grew E. coli, pansensitive.  Fever and leukocytosis have resolved.  However the patient is skeptical about going home.  ID consulted to assist with management.  Seen by Dr. Luciana Axe, ID, recommended additional 7 days of cefadroxil after completion of 3 days of Rocephin.  Okay to go home.   03/30/2022: States she hurts all over.  On exam tender point diffusely, suspect undiagnosed fibromyalgia.  Assessment/Plan: Principal Problem:   Bacteremia Active Problems:   GERD (gastroesophageal reflux disease)   Acute pyelonephritis   Asthma  E. coli bacteremia, pansensitive, POA Blood cultures obtained on 03/23/2022. Likely from urinary source, acute left pyelonephritis seen on CT scan. Blood cultures obtained on 03/27/2022 negative to date. Seen by Dr. Luciana Axe, ID, recommended additional 7 days of cefadroxil after completion of 3 days of Rocephin.  Okay to go home.  Acute left pyelonephritis, seen on CT scan Received 3 days of Rocephin. Complete total 10 days of antibiotics with additional 7 days of cefadroxil Analgesics as needed with bowel regimen  GERD Continue p.o. PPI, Protonix 40 mg daily  Chronic back pain/history of cervical spine/lumbar spine/S1 spinal surgery No acute findings seen on contrast CT scan abdomen and pelvis The patient had a recent cervical spine MRI without contrast on 02/13/2022 without acute findings.  Resolved acute blood loss anemia, likely erroneous Presented with hemoglobin of 12,  down-trended to 10.8 on 03/28/2022 No overt bleeding Repeated hemoglobin 12.1 on 03/30/2022.   Code Status: Full code  Family Communication: None at bedside  Disposition Plan: Likely will discharge home 03/31/2022.   Consultants: Infectious disease  Procedures: None.  Antimicrobials: Rocephin 2 g daily, completed 3 days Switched to cefadroxil on 03/30/2022.  DVT prophylaxis: SCDs  Status is: Inpatient The patient requires at least 2 midnights for further evaluation and treatment of present condition.    Objective: Vitals:   03/29/22 2300 03/30/22 0404 03/30/22 0543 03/30/22 1508  BP:  109/70  112/66  Pulse:  81  74  Resp:  16  16  Temp: 98.6 F (37 C) 99 F (37.2 C) 99.5 F (37.5 C) 98.8 F (37.1 C)  TempSrc: Oral Oral Oral Oral  SpO2:  98%  100%  Weight:      Height:        Intake/Output Summary (Last 24 hours) at 03/30/2022 1657 Last data filed at 03/30/2022 1300 Gross per 24 hour  Intake 720 ml  Output --  Net 720 ml   Filed Weights   03/27/22 1536  Weight: 60.8 kg    Exam:   General: 51 y.o. year-old female well-developed well-nourished in no acute distress.  She is alert and oriented x3.   Cardiovascular: Regular rate and rhythm no rubs or gallops.   Respiratory: Clear to auscultation no wheezes or rales.   Abdomen: Soft noted normal bowel sounds present. Musculoskeletal: No lower extremity edema bilaterally.   Skin: No ulcerative lesions noted. Psychiatry: Mood is appropriate for condition and setting.   Data Reviewed: CBC: Recent Labs  Lab 03/27/22 1614 03/28/22 0022 03/30/22 0457  WBC 9.9 8.3 10.4  NEUTROABS 7.4  --  7.6  HGB 11.5* 10.8* 12.1  HCT 35.2* 33.0* 36.8  MCV 98.6 98.2 98.1  PLT 407* 383 523*   Basic Metabolic Panel: Recent Labs  Lab 03/27/22 1614 03/28/22 0022 03/30/22 0457  NA 144 142 141  K 4.6 3.7 4.2  CL 107 107 100  CO2 GLUCOSE 108* 99 132*  BUN CREATININE 0.68 0.69 0.74  CALCIUM 9.1  8.1* 9.0  MG 2.6* 2.3 2.3  PHOS  --   --  4.3   GFR: Estimated Creatinine Clearance: 63.5 mL/min (by C-G formula based on SCr of 0.74 mg/dL). Liver Function Tests: Recent Labs  Lab 03/27/22 1614 03/28/22 0022 03/30/22 0457  AST 23 18 61*  ALT 24 20 66*  ALKPHOS 134* 109 196*  BILITOT 0.4 0.3 0.4  PROT 7.5 6.7 7.4  ALBUMIN 3.0* 2.7* 3.0*   No results for input(s): "LIPASE", "AMYLASE" in the last 168 hours.  No results for input(s): "AMMONIA" in the last 168 hours. Coagulation Profile: No results for input(s): "INR", "PROTIME" in the last 168 hours. Cardiac Enzymes: No results for input(s): "CKTOTAL", "CKMB", "CKMBINDEX", "TROPONINI" in the last 168 hours. BNP (last 3 results) No results for input(s): "PROBNP" in the last 8760 hours. HbA1C: No results for input(s): "HGBA1C" in the last 72 hours. CBG: No results for input(s): "GLUCAP" in the last 168 hours. Lipid Profile: No results for input(s): "CHOL", "HDL", "LDLCALC", "TRIG", "CHOLHDL", "LDLDIRECT" in the last 72 hours. Thyroid Function Tests: No results for input(s): "TSH", "T4TOTAL", "FREET4", "T3FREE", "THYROIDAB" in the last 72 hours. Anemia Panel: No results for input(s): "VITAMINB12", "FOLATE", "FERRITIN", "TIBC", "IRON", "RETICCTPCT" in the last 72 hours. Urine analysis:    Component Value Date/Time   COLORURINE YELLOW 03/27/2022 2329   APPEARANCEUR HAZY (A) 03/27/2022 2329   LABSPEC 1.008 03/27/2022 2329   PHURINE 6.0 03/27/2022 2329   GLUCOSEU NEGATIVE 03/27/2022 2329   HGBUR NEGATIVE 03/27/2022 2329   BILIRUBINUR NEGATIVE 03/27/2022 2329   KETONESUR NEGATIVE 03/27/2022 2329   PROTEINUR NEGATIVE 03/27/2022 2329   UROBILINOGEN 0.2 10/29/2019 1233   NITRITE NEGATIVE 03/27/2022 2329   LEUKOCYTESUR LARGE (A) 03/27/2022 2329   Sepsis Labs: (procalcitonin:4,lacticidven:4)  ) Recent Results (from the past 240 hour(s))  Resp Panel by RT-PCR (Flu A&B, Covid) Anterior Nasal Swab     Status: None    Collection Time: 03/23/22  5:21 AM   Specimen: Anterior Nasal Swab  Result Value Ref Range Status   SARS Coronavirus 2 by RT PCR NEGATIVE NEGATIVE Final    Comment: (NOTE) SARS-CoV-2 target nucleic acids are NOT DETECTED.  The SARS-CoV-2 RNA is generally detectable in upper respiratory specimens during the acute phase of infection. The lowest concentration of SARS-CoV-2 viral copies this assay can detect is 138 copies/mL. A negative result does not preclude SARS-Cov-2 infection and should not be used as the sole basis for treatment or other patient management decisions. A negative result may occur with  improper specimen collection/handling, submission of specimen other than nasopharyngeal swab, presence of viral mutation(s) within the areas targeted by this assay, and inadequate number of viral copies(<138 copies/mL). A negative result must be combined with clinical observations, patient history, and epidemiological information. The expected result is Negative.  Fact Sheet for Patients:  BloggerCourse.com  Fact Sheet for Healthcare Providers:  SeriousBroker.it  This test is no t yet approved or cleared by the [place] FDA and  has been authorized for detection and/or diagnosis  of SARS-CoV-2 by FDA under an Emergency Use Authorization (EUA). This EUA will remain  in effect (meaning this test can be used) for the duration of the COVID-19 declaration under Section 564(b)(1) of the Act, 21 U.S.C.section 360bbb-3(b)(1), unless the authorization is terminated  or revoked sooner.       Influenza A by PCR NEGATIVE NEGATIVE Final   Influenza B by PCR NEGATIVE NEGATIVE Final    Comment: (NOTE) The Xpert Xpress SARS-CoV-2/FLU/RSV plus assay is intended as an aid in the diagnosis of influenza from Nasopharyngeal swab specimens and should not be used as a sole basis for treatment. Nasal washings and aspirates are unacceptable for  Xpert Xpress SARS-CoV-2/FLU/RSV testing.  Fact Sheet for Patients: BloggerCourse.com  Fact Sheet for Healthcare Providers: SeriousBroker.it  This test is not yet approved or cleared by the [place] FDA and has been authorized for detection and/or diagnosis of SARS-CoV-2 by FDA under an Emergency Use Authorization (EUA). This EUA will remain in effect (meaning this test can be used) for the duration of the COVID-19 declaration under Section 564(b)(1) of the Act, 21 U.S.C. section 360bbb-3(b)(1), unless the authorization is terminated or revoked.  Performed at [facility], [address], [place], [place] [address]   Blood culture (routine x 2)     Status: Abnormal   Collection Time: 03/23/22  5:35 AM   Specimen: BLOOD  Result Value Ref Range Status   Specimen Description BLOOD RIGHT ANTECUBITAL  Final   Special Requests   Final    BOTTLES DRAWN AEROBIC AND ANAEROBIC Blood Culture results may not be optimal due to an excessive volume of blood received in culture bottles   Culture  Setup Time   Final    GRAM NEGATIVE RODS ANAEROBIC BOTTLE ONLY CRITICAL RESULT CALLED TO, READ BACK BY AND VERIFIED WITH: RN Dorian Furnace 1304 [id number] FCP Performed at [facility], [address], [place], [place] [address]    Culture ESCHERICHIA COLI (A)  Final   Report Status 03/29/2022 FINAL  Final   Organism ID, Bacteria ESCHERICHIA COLI  Final      Susceptibility   Escherichia coli - MIC*    AMPICILLIN <=2 SENSITIVE Sensitive     CEFAZOLIN <=4 SENSITIVE Sensitive     CEFEPIME <=0.12 SENSITIVE Sensitive     CEFTAZIDIME <=1 SENSITIVE Sensitive     CEFTRIAXONE <=0.25 SENSITIVE Sensitive     CIPROFLOXACIN <=0.25 SENSITIVE Sensitive     GENTAMICIN <=1 SENSITIVE Sensitive     IMIPENEM <=0.25 SENSITIVE Sensitive     TRIMETH/SULFA <=20 SENSITIVE Sensitive     AMPICILLIN/SULBACTAM <=2 SENSITIVE Sensitive     PIP/TAZO <=4 SENSITIVE  Sensitive     * ESCHERICHIA COLI  Blood Culture ID Panel (Reflexed)     Status: Abnormal   Collection Time: 03/23/22  5:35 AM  Result Value Ref Range Status   Enterococcus faecalis NOT DETECTED NOT DETECTED Final   Enterococcus Faecium NOT DETECTED NOT DETECTED Final   Listeria monocytogenes NOT DETECTED NOT DETECTED Final   Staphylococcus species NOT DETECTED NOT DETECTED Final   Staphylococcus aureus (BCID) NOT DETECTED NOT DETECTED Final   Staphylococcus epidermidis NOT DETECTED NOT DETECTED Final   Staphylococcus lugdunensis NOT DETECTED NOT DETECTED Final   Streptococcus species NOT DETECTED NOT DETECTED Final   Streptococcus agalactiae NOT DETECTED NOT DETECTED Final   Streptococcus pneumoniae NOT DETECTED NOT DETECTED Final   Streptococcus pyogenes NOT DETECTED NOT DETECTED Final   A.calcoaceticus-baumannii NOT DETECTED NOT DETECTED Final  Bacteroides fragilis NOT DETECTED NOT DETECTED Final   Enterobacterales DETECTED (A) NOT DETECTED Final    Comment: Enterobacterales represent a large order of gram negative bacteria, not a single organism. CRITICAL RESULT CALLED TO, READ BACK BY AND VERIFIED WITH: RN SOPHIE G 1304 [id number] FCP    Enterobacter cloacae complex NOT DETECTED NOT DETECTED Final   Escherichia coli DETECTED (A) NOT DETECTED Final    Comment: CRITICAL RESULT CALLED TO, READ BACK BY AND VERIFIED WITH: RN SOPHIE G 1304 [id number] FCP    Klebsiella aerogenes NOT DETECTED NOT DETECTED Final   Klebsiella oxytoca NOT DETECTED NOT DETECTED Final   Klebsiella pneumoniae NOT DETECTED NOT DETECTED Final   Proteus species NOT DETECTED NOT DETECTED Final   Salmonella species NOT DETECTED NOT DETECTED Final   Serratia marcescens NOT DETECTED NOT DETECTED Final   Haemophilus influenzae NOT DETECTED NOT DETECTED Final   Neisseria meningitidis NOT DETECTED NOT DETECTED Final   Pseudomonas aeruginosa NOT DETECTED NOT DETECTED Final   Stenotrophomonas maltophilia NOT DETECTED NOT  DETECTED Final   Candida albicans NOT DETECTED NOT DETECTED Final   Candida auris NOT DETECTED NOT DETECTED Final   Candida glabrata NOT DETECTED NOT DETECTED Final   Candida krusei NOT DETECTED NOT DETECTED Final   Candida parapsilosis NOT DETECTED NOT DETECTED Final   Candida tropicalis NOT DETECTED NOT DETECTED Final   Cryptococcus neoformans/gattii NOT DETECTED NOT DETECTED Final   CTX-M ESBL NOT DETECTED NOT DETECTED Final   Carbapenem resistance IMP NOT DETECTED NOT DETECTED Final   Carbapenem resistance KPC NOT DETECTED NOT DETECTED Final   Carbapenem resistance NDM NOT DETECTED NOT DETECTED Final   Carbapenem resist OXA 48 LIKE NOT DETECTED NOT DETECTED Final   Carbapenem resistance VIM NOT DETECTED NOT DETECTED Final    Comment: Performed at [facility], [address], [place], [place] [address]  Blood culture (routine x 2)     Status: None   Collection Time: 03/23/22  5:36 AM   Specimen: BLOOD  Result Value Ref Range Status   Specimen Description BLOOD LEFT ANTECUBITAL  Final   Special Requests   Final    BOTTLES DRAWN AEROBIC AND ANAEROBIC Blood Culture adequate volume   Culture   Final    NO GROWTH 5 DAYS Performed at [facility], [address], [place], [place] [address]    Report Status 03/28/2022 FINAL  Final  Blood culture (routine x 2)     Status: None (Preliminary result)   Collection Time: 03/27/22  4:15 PM   Specimen: BLOOD  Result Value Ref Range Status   Specimen Description   Final    BLOOD RIGHT ANTECUBITAL Performed at [facility], [address] [place], [place] [address]    Special Requests   Final    BOTTLES DRAWN AEROBIC AND ANAEROBIC Blood Culture results may not be optimal due to an excessive volume of blood received in culture bottles Performed at [facility], [address], [place], [place] [address]    Culture   Final    NO GROWTH 3 DAYS Performed at [facility], [address], [place], [place] [address]    Report Status PENDING  Incomplete  Blood culture (routine x 2)     Status: None (Preliminary result)   Collection Time: 03/27/22  5:24 PM   Specimen: BLOOD  Result Value Ref Range Status   Specimen Description   Final    BLOOD SITE NOT  SPECIFIED Performed at [facility], [address] [place], [place] [address]    Special Requests   Final    BOTTLES DRAWN AEROBIC AND ANAEROBIC Blood Culture adequate volume Performed at [facility], [address] [place], [place] [address]    Culture   Final    NO GROWTH 3 DAYS Performed at [facility], [address] [place], [place] [address]    Report Status PENDING  Incomplete  MRSA Next Gen by PCR, Nasal     Status: None   Collection Time: 03/27/22 11:29 PM   Specimen: Nasal Swab  Result Value Ref Range Status   MRSA by PCR Next Gen NOT DETECTED NOT DETECTED Final    Comment: (NOTE) The GeneXpert MRSA Assay (FDA approved for NASAL specimens only), is one component of a comprehensive MRSA colonization surveillance program. It is not intended to diagnose MRSA infection nor to guide or monitor treatment for MRSA infections. Test performance is not FDA approved in patients less than 549 years old. Performed at [facility], [address] [place], [place] [address]       Studies: No results found.  Scheduled Meds:  cefadroxil  1,000 mg Oral BID   gabapentin  300 mg Oral TID   pantoprazole  40 mg Oral QHS   senna-docusate  2 tablet Oral QHS    Continuous Infusions:     LOS: 3 days     Darlin Droparole N Kassandra Meriweather, MD [facility] Pager [phone]  If 7PM-7AM, please contact night-coverage [email or web address] Password [email or web address] 03/30/2022, 4:57 PM

## 2022-03-30 NOTE — Consult Note (Signed)
[facility]       Reason for Consult:pyelonephritis    Referring Physician: Dr. Margo AyeHall   Principal Problem:   Bacteremia Active Problems:   GERD (gastroesophageal reflux disease)   Acute pyelonephritis   Asthma    cefadroxil  1,000 mg Oral BID   gabapentin  300 mg Oral TID   pantoprazole  40 mg Oral QHS   senna-docusate  2 tablet Oral QHS    Recommendations: Cefadroxil 7 more days   Assessment: Bridget Stevens has pyelonephritis based on fever, bacteremia with E coli and CT findings.  Pan-sensitive so treatment as above.  WBC wnl.  Bridget Stevens may continue to experience fever though overall fever curve trending down.  OK from ID standpoint for discharge.    Call with questions.   Antibiotics: Ceftriaxone 3 days  HPI: Bridget Stevens is a 51 y.o. female with a history of gastric bypass initially presented to the ED with fever and chills then returned to the ED with positive blood culture for E coli.  Started on IV ceftriaxone.  Fever curve declining.  Normal WBC.  Tolerating po intake.  Fatigued.    Review of Systems:  Gastrointestinal: negative for nausea and diarrhea All other systems reviewed and are negative    Past Medical History:  Diagnosis Date   Anxiety    Arthritis    Asthma    GERD (gastroesophageal reflux disease)    Headache    Hepatomegaly    Hx of migraines    Hyperlipidemia    Ovarian cyst    Thyroid disease     Social History   Tobacco Use   Smoking status: Never   Smokeless tobacco: Never  Vaping Use   Vaping Use: Never used  Substance Use Topics   Alcohol use: Not Currently   Drug use: No    Family History  Problem Relation Age of Onset   Diabetes Father    Hypertension Father    Hyperlipidemia Father    Thyroid disease Father    Asthma Father    Cancer Mother    Hypertension Mother    Thyroid disease Mother    Cancer Sister        CERVICAL   Thyroid disease Sister    Asthma Sister    Asthma Brother    Breast  cancer Maternal Grandmother        in her 3280s    Allergies  Allergen Reactions   Shellfish Allergy Anaphylaxis   Peanut-Containing Drug Products Swelling    SWELLING REACTION UNSPECIFIED    Sulfa Antibiotics Hives   Advil [Ibuprofen]     Due to gastric surgery   Percocet [Oxycodone-Acetaminophen] Other (See Comments)    PT STATES THAT Bridget Stevens HAS HEADACHES WITH THIS MED    Physical Exam: Constitutional: in no apparent distress  Vitals:   03/30/22 0543 03/30/22 1508  BP:  112/66  Pulse:  74  Resp:  16  Temp: 99.5 F (37.5 C) 98.8 F (37.1 C)  SpO2:  100%   EYES: anicteric Respiratory: normal respiratory effort Musculoskeletal: no edema  Lab Results  Component Value Date   WBC 10.4 03/30/2022   HGB 12.1 03/30/2022   HCT 36.8 03/30/2022   MCV 98.1 03/30/2022   PLT 523 (H) 03/30/2022    Lab Results  Component Value Date   CREATININE 0.74 03/30/2022   BUN 14 03/30/2022   NA 141 03/30/2022   K 4.2 03/30/2022   CL 100 03/30/2022  CO2 31 03/30/2022    Lab Results  Component Value Date   ALT 66 (H) 03/30/2022   AST 61 (H) 03/30/2022   ALKPHOS 196 (H) 03/30/2022     Microbiology: Recent Results (from the past 240 hour(s))  Resp Panel by RT-PCR (Flu A&B, Covid) Anterior Nasal Swab     Status: None   Collection Time: 03/23/22  5:21 AM   Specimen: Anterior Nasal Swab  Result Value Ref Range Status   SARS Coronavirus 2 by RT PCR NEGATIVE NEGATIVE Final    Comment: (NOTE) SARS-CoV-2 target nucleic acids are NOT DETECTED.  The SARS-CoV-2 RNA is generally detectable in upper respiratory specimens during the acute phase of infection. The lowest concentration of SARS-CoV-2 viral copies this assay can detect is 138 copies/mL. A negative result does not preclude SARS-Cov-2 infection and should not be used as the sole basis for treatment or other patient management decisions. A negative result may occur with  improper specimen collection/handling, submission of specimen  other than nasopharyngeal swab, presence of viral mutation(s) within the areas targeted by this assay, and inadequate number of viral copies(<138 copies/mL). A negative result must be combined with clinical observations, patient history, and epidemiological information. The expected result is Negative.  Fact Sheet for Patients:  BloggerCourse.com  Fact Sheet for Healthcare Providers:  SeriousBroker.it  This test is no t yet approved or cleared by the [place] FDA and  has been authorized for detection and/or diagnosis of SARS-CoV-2 by FDA under an Emergency Use Authorization (EUA). This EUA will remain  in effect (meaning this test can be used) for the duration of the COVID-19 declaration under Section 564(b)(1) of the Act, 21 U.S.C.section 360bbb-3(b)(1), unless the authorization is terminated  or revoked sooner.       Influenza A by PCR NEGATIVE NEGATIVE Final   Influenza B by PCR NEGATIVE NEGATIVE Final    Comment: (NOTE) The Xpert Xpress SARS-CoV-2/FLU/RSV plus assay is intended as an aid in the diagnosis of influenza from Nasopharyngeal swab specimens and should not be used as a sole basis for treatment. Nasal washings and aspirates are unacceptable for Xpert Xpress SARS-CoV-2/FLU/RSV testing.  Fact Sheet for Patients: BloggerCourse.com  Fact Sheet for Healthcare Providers: SeriousBroker.it  This test is not yet approved or cleared by the [place] FDA and has been authorized for detection and/or diagnosis of SARS-CoV-2 by FDA under an Emergency Use Authorization (EUA). This EUA will remain in effect (meaning this test can be used) for the duration of the COVID-19 declaration under Section 564(b)(1) of the Act, 21 U.S.C. section 360bbb-3(b)(1), unless the authorization is terminated or revoked.  Performed at [facility], [address], [place],  [place] [address]   Blood culture (routine x 2)     Status: Abnormal   Collection Time: 03/23/22  5:35 AM   Specimen: BLOOD  Result Value Ref Range Status   Specimen Description BLOOD RIGHT ANTECUBITAL  Final   Special Requests   Final    BOTTLES DRAWN AEROBIC AND ANAEROBIC Blood Culture results may not be optimal due to an excessive volume of blood received in culture bottles   Culture  Setup Time   Final    GRAM NEGATIVE RODS ANAEROBIC BOTTLE ONLY CRITICAL RESULT CALLED TO, READ BACK BY AND VERIFIED WITH: RN Dorian Furnace 1304 [id number] FCP Performed at [facility], [address], [place], [place] [address]    Culture ESCHERICHIA COLI (A)  Final   Report Status 03/29/2022 FINAL  Final   Organism ID, Bacteria ESCHERICHIA COLI  Final      Susceptibility   Escherichia coli - MIC*    AMPICILLIN <=2 SENSITIVE Sensitive     CEFAZOLIN <=4 SENSITIVE Sensitive     CEFEPIME <=0.12 SENSITIVE Sensitive     CEFTAZIDIME <=1 SENSITIVE Sensitive     CEFTRIAXONE <=0.25 SENSITIVE Sensitive     CIPROFLOXACIN <=0.25 SENSITIVE Sensitive     GENTAMICIN <=1 SENSITIVE Sensitive     IMIPENEM <=0.25 SENSITIVE Sensitive     TRIMETH/SULFA <=20 SENSITIVE Sensitive     AMPICILLIN/SULBACTAM <=2 SENSITIVE Sensitive     PIP/TAZO <=4 SENSITIVE Sensitive     * ESCHERICHIA COLI  Blood Culture ID Panel (Reflexed)     Status: Abnormal   Collection Time: 03/23/22  5:35 AM  Result Value Ref Range Status   Enterococcus faecalis NOT DETECTED NOT DETECTED Final   Enterococcus Faecium NOT DETECTED NOT DETECTED Final   Listeria monocytogenes NOT DETECTED NOT DETECTED Final   Staphylococcus species NOT DETECTED NOT DETECTED Final   Staphylococcus aureus (BCID) NOT DETECTED NOT DETECTED Final   Staphylococcus epidermidis NOT DETECTED NOT DETECTED Final   Staphylococcus lugdunensis NOT DETECTED NOT DETECTED Final   Streptococcus species NOT DETECTED NOT DETECTED Final   Streptococcus agalactiae NOT DETECTED NOT DETECTED Final    Streptococcus pneumoniae NOT DETECTED NOT DETECTED Final   Streptococcus pyogenes NOT DETECTED NOT DETECTED Final   A.calcoaceticus-baumannii NOT DETECTED NOT DETECTED Final   Bacteroides fragilis NOT DETECTED NOT DETECTED Final   Enterobacterales DETECTED (A) NOT DETECTED Final    Comment: Enterobacterales represent a large order of gram negative bacteria, not a single organism. CRITICAL RESULT CALLED TO, READ BACK BY AND VERIFIED WITH: RN SOPHIE G 1304 [id number] FCP    Enterobacter cloacae complex NOT DETECTED NOT DETECTED Final   Escherichia coli DETECTED (A) NOT DETECTED Final    Comment: CRITICAL RESULT CALLED TO, READ BACK BY AND VERIFIED WITH: RN SOPHIE G 1304 [id number] FCP    Klebsiella aerogenes NOT DETECTED NOT DETECTED Final   Klebsiella oxytoca NOT DETECTED NOT DETECTED Final   Klebsiella pneumoniae NOT DETECTED NOT DETECTED Final   Proteus species NOT DETECTED NOT DETECTED Final   Salmonella species NOT DETECTED NOT DETECTED Final   Serratia marcescens NOT DETECTED NOT DETECTED Final   Haemophilus influenzae NOT DETECTED NOT DETECTED Final   Neisseria meningitidis NOT DETECTED NOT DETECTED Final   Pseudomonas aeruginosa NOT DETECTED NOT DETECTED Final   Stenotrophomonas maltophilia NOT DETECTED NOT DETECTED Final   Candida albicans NOT DETECTED NOT DETECTED Final   Candida auris NOT DETECTED NOT DETECTED Final   Candida glabrata NOT DETECTED NOT DETECTED Final   Candida krusei NOT DETECTED NOT DETECTED Final   Candida parapsilosis NOT DETECTED NOT DETECTED Final   Candida tropicalis NOT DETECTED NOT DETECTED Final   Cryptococcus neoformans/gattii NOT DETECTED NOT DETECTED Final   CTX-M ESBL NOT DETECTED NOT DETECTED Final   Carbapenem resistance IMP NOT DETECTED NOT DETECTED Final   Carbapenem resistance KPC NOT DETECTED NOT DETECTED Final   Carbapenem resistance NDM NOT DETECTED NOT DETECTED Final   Carbapenem resist OXA 48 LIKE NOT DETECTED NOT DETECTED Final    Carbapenem resistance VIM NOT DETECTED NOT DETECTED Final    Comment: Performed at [facility], [address], [place], [place] [address]  Blood culture (routine x 2)     Status: None   Collection Time: 03/23/22  5:36 AM   Specimen: BLOOD  Result  Value Ref Range Status   Specimen Description BLOOD LEFT ANTECUBITAL  Final   Special Requests   Final    BOTTLES DRAWN AEROBIC AND ANAEROBIC Blood Culture adequate volume   Culture   Final    NO GROWTH 5 DAYS Performed at [facility], [address], [place], [place] [address]    Report Status 03/28/2022 FINAL  Final  Blood culture (routine x 2)     Status: None ([facility]   Collection Time: 03/27/22  4:15 PM   Specimen: BLOOD  Result Value Ref Range Status   Specimen Description   Final    BLOOD RIGHT ANTECUBITAL Performed at [facility], [address] [place], [place] [address]    Special Requests   Final    BOTTLES DRAWN AEROBIC AND ANAEROBIC Blood Culture results may not be optimal due to an excessive volume of blood received in culture bottles Performed at [facility], [address] [place], [place] [address]    Culture   Final    NO GROWTH 3 DAYS Performed at [facility], [address] [place], [place] [address]    Report Status PENDING  Incomplete  Blood culture (routine x 2)     Status: None (Preliminary result)   Collection Time: 03/27/22  5:24 PM   Specimen: BLOOD  Result Value Ref Range Status   Specimen Description   Final    BLOOD SITE NOT SPECIFIED Performed at [facility], [address], [place], [place] [address]    Special Requests   Final    BOTTLES DRAWN AEROBIC AND ANAEROBIC Blood Culture adequate volume Performed at [facility], [address] [place], [place] [address]    Culture   Final    NO GROWTH 3 DAYS Performed at [facility], [address], [place], [place] [address]    Report Status  PENDING  Incomplete  MRSA Next Gen by PCR, Nasal     Status: None   Collection Time: 03/27/22 11:29 PM   Specimen: Nasal Swab  Result Value Ref Range Status   MRSA by PCR Next Gen NOT DETECTED NOT DETECTED Final    Comment: (NOTE) The GeneXpert MRSA Assay (FDA approved for NASAL specimens only), is one component of a comprehensive MRSA colonization surveillance program. It is not intended to diagnose MRSA infection nor to guide or monitor treatment for MRSA infections. Test performance is not FDA approved in patients less than 59 years old. Performed at [facility], [address], [place], [place] [address]     Gardiner Barefoot, MD [facility] [email or web address] 03/30/2022, 3:16 PM

## 2022-03-31 DIAGNOSIS — N12 Tubulo-interstitial nephritis, not specified as acute or chronic: Secondary | ICD-10-CM | POA: Diagnosis not present

## 2022-03-31 DIAGNOSIS — R7881 Bacteremia: Secondary | ICD-10-CM | POA: Diagnosis not present

## 2022-03-31 DIAGNOSIS — N10 Acute tubulo-interstitial nephritis: Secondary | ICD-10-CM | POA: Diagnosis not present

## 2022-03-31 MED ORDER — CEFADROXIL 500 MG PO CAPS: 1000.0000 mg | ORAL_CAPSULE | Freq: Two times a day (BID) | ORAL | 0 refills | Status: AC

## 2022-03-31 NOTE — Hospital Course (Signed)
51 y.o. female with medical history significant of C5-C6, L4-L5 S1 spinal surgeries, GERD, HLD, gastric bypass tubal ligation who presented with positive blood cultures, persistent fatigue, sweats, acute left-sided pyelonephritis seen on CT scan.  No acute musculoskeletal findings on CT scan.  Blood cultures obtained on 03/23/2022 grew E. coli, pansensitive.  Fever and leukocytosis have resolved.  However the patient is skeptical about going home.  ID consulted

## 2022-03-31 NOTE — Discharge Summary (Signed)
Physician Discharge Summary   Patient: Bridget Stevens MRN: [id number] DOB: Jun 27, 1971  Admit date:     03/27/2022  Discharge date: 03/31/22  Discharge Physician: Rickey Barbara   PCP: Lewanda Rife, PA   Recommendations at discharge:    Follow up with PCP in 1-2 weeks  Discharge Diagnoses: Principal Problem:   Bacteremia Active Problems:   GERD (gastroesophageal reflux disease)   Acute pyelonephritis   Asthma  Resolved Problems:   * No resolved hospital problems. *  Hospital Course: 51 y.o. female with medical history significant of C5-C6, L4-L5 S1 spinal surgeries, GERD, HLD, gastric bypass tubal ligation who presented with positive blood cultures, persistent fatigue, sweats, acute left-sided pyelonephritis seen on CT scan.  No acute musculoskeletal findings on CT scan.  Blood cultures obtained on 03/23/2022 grew E. coli, pansensitive.  Fever and leukocytosis have resolved.  However the patient is skeptical about going home.  ID consulted   Assessment and Plan: No notes have been filed under this hospital service. Service: Hospitalist E. coli bacteremia, pansensitive, POA Blood cultures obtained on 03/23/2022. Likely from urinary source, acute left pyelonephritis seen on CT scan. Blood cultures obtained on 03/27/2022 negative to date. Seen by Dr. Luciana Axe, ID, recommended additional 7 days of cefadroxil after completion of 3 days of Rocephin.  Okay to go home.   Acute left pyelonephritis, seen on CT scan Received 3 days of Rocephin. Complete total 10 days of antibiotics with additional 7 days of cefadroxil   GERD Continue p.o. PPI, Protonix 40 mg daily   Chronic back pain/history of cervical spine/lumbar spine/S1 spinal surgery No acute findings seen on contrast CT scan abdomen and pelvis The patient had a recent cervical spine MRI without contrast on 02/13/2022 without acute findings.   Resolved acute blood loss anemia, likely erroneous Presented with hemoglobin of 12,  down-trended to 10.8 on 03/28/2022 No overt bleeding        Consultants: ID Procedures performed:   Disposition: Home Diet recommendation:  Regular diet DISCHARGE MEDICATION: Allergies as of 03/31/2022       Reactions   Shellfish Allergy Anaphylaxis   Peanut-containing Drug Products Swelling   SWELLING REACTION UNSPECIFIED    Sulfa Antibiotics Hives   Advil [ibuprofen]    Due to gastric surgery   Percocet [oxycodone-acetaminophen] Other (See Comments)   PT STATES THAT SHE HAS HEADACHES WITH THIS MED        Medication List     STOP taking these medications    cefpodoxime 200 MG tablet Commonly known as: VANTIN   cephALEXin 500 MG capsule Commonly known as: KEFLEX       TAKE these medications    acetaminophen 500 MG tablet Commonly known as: TYLENOL Take 1,000 mg by mouth every 6 (six) hours as needed for moderate pain.   albuterol 108 (90 Base) MCG/ACT inhaler Commonly known as: VENTOLIN HFA Inhale 2 puffs into the lungs every 4 (four) hours as needed for wheezing or shortness of breath.   Butalbital-Acetaminophen 50-300 MG Tabs Take 1 tablet by mouth every 6 (six) hours as needed (migraines).   calcium elemental as carbonate 400 MG chewable tablet Commonly known as: BARIATRIC TUMS ULTRA Chew 1,000-2,000 mg by mouth 3 (three) times daily as needed for heartburn (acid reflux/indigestion.).   cefadroxil 500 MG capsule Commonly known as: DURICEF Take 2 capsules (1,000 mg total) by mouth 2 (two) times daily for 6 days.   cycloSPORINE 0.05 % ophthalmic emulsion Commonly known as: RESTASIS Place 1 drop into both  eyes 2 (two) times daily as needed (dry/irritated eyes.).   EPINEPHrine 0.3 mg/0.3 mL Soaj injection Commonly known as: EPI-PEN Inject 0.3 mg into the muscle as needed for anaphylaxis.   famotidine 40 MG tablet Commonly known as: PEPCID Take 40 mg by mouth at bedtime.   Flarex 0.1 % ophthalmic suspension Generic drug: fluorometholone Apply 1  drop to eye 2 (two) times daily as needed (itching eyes).   gabapentin 300 MG capsule Commonly known as: NEURONTIN Take 300 mg by mouth in the morning, at noon, and at bedtime.   methocarbamol 500 MG tablet Commonly known as: ROBAXIN Take 500 mg by mouth in the morning and at bedtime.   multivitamin animal shapes (with Ca/FA) with C & FA chewable tablet Chew 1 tablet by mouth 2 (two) times daily.   omeprazole 40 MG capsule Commonly known as: PRILOSEC Take 40 mg by mouth daily.   Pfizer COVID-19 Vac Bivalent injection Generic drug: COVID-19 mRNA bivalent vaccine Proofreader) Inject into the muscle.   polyethylene glycol 17 g packet Commonly known as: MIRALAX / GLYCOLAX Take 17 g by mouth daily as needed (constipation.).   potassium chloride SA 20 MEQ tablet Commonly known as: KLOR-CON M Take 1 tablet (20 mEq total) by mouth daily.   QC CALCIUM 600 +D3 PO Take 1 tablet by mouth daily.   Vitamin D 50 MCG (2000 UT) Caps Take 2,000 Units by mouth every evening.        Follow-up Information     [place], [place], [place] Follow up in 2 week(s).   Specialty: Physician Assistant Why: Hospital follow up Contact information: [address] [place] [address] [phone]                Discharge Exam: Filed Weights   03/27/22 1536  Weight: 60.8 kg   General exam: Awake, laying in bed, in nad Respiratory system: Normal respiratory effort, no wheezing Cardiovascular system: regular rate, s1, s2 Gastrointestinal system: Soft, nondistended, positive BS Central nervous system: CN2-12 grossly intact, strength intact Extremities: Perfused, no clubbing Skin: Normal skin turgor, no notable skin lesions seen Psychiatry: Mood normal // no visual hallucinations   Condition at discharge: fair  The results of significant diagnostics from this hospitalization (including imaging, microbiology, ancillary and laboratory) are listed below for reference.   Imaging Studies: CT  Abdomen Pelvis W Contrast  Result Date: 03/23/2022 CLINICAL DATA:  Abdominal pain.  Fever. EXAM: CT ABDOMEN AND PELVIS WITH CONTRAST TECHNIQUE: Multidetector CT imaging of the abdomen and pelvis was performed using the standard protocol following bolus administration of intravenous contrast. RADIATION DOSE REDUCTION: This exam was performed according to the departmental dose-optimization program which includes automated exposure control, adjustment of the mA and/or kV according to patient size and/or use of iterative reconstruction technique. CONTRAST:  OMNIPAQUE IOHEXOL 300 MG/ML  SOLN COMPARISON:  08/28/2021 FINDINGS: Lower chest: No acute abnormality. Hepatobiliary: No focal liver abnormality. Gallbladder appears normal. Fusiform dilatation of the CBD measures up to 9 mm. Pancreas: Unremarkable. No pancreatic ductal dilatation or surrounding inflammatory changes. Spleen: Normal in size without focal abnormality. Adrenals/Urinary Tract: Normal adrenal glands. Normal appearance of the right kidney without signs of mass, nephrolithiasis or hydronephrosis. Asymmetric edema with striated nephrograms is noted involving the left kidney compatible with acute pyelonephritis. No mass, nephrolithiasis or hydronephrosis identified. Urinary bladder is unremarkable. Stomach/Bowel: Postoperative changes from prior gastric bypass surgery. The appendix is visualized and appears normal. No bowel wall thickening, inflammation, or distension. Vascular/Lymphatic: Aortic atherosclerosis. No  aneurysm. No signs of abdominopelvic adenopathy. Reproductive: Uterus and bilateral adnexa are unremarkable. Other: No significant free fluid or fluid collections. No signs of pneumoperitoneum. Musculoskeletal: No acute findings. Status post posterior hardware fixation and interbody fusion from L4 through S1. IMPRESSION: 1. Findings compatible with acute left-sided pyelonephritis. 2. Fusiform dilatation of the CBD measures up to 9 mm. No  obstructing stone or mass noted. 3. Aortic Atherosclerosis (ICD10-I70.0). Electronically Signed   By: Signa Kell M.D.   On: 03/23/2022 06:36   DG Chest 2 View  Result Date: 03/23/2022 CLINICAL DATA:  Cough with weakness and fever EXAM: CHEST - 2 VIEW COMPARISON:  11/27/2021 FINDINGS: Artifact from EKG leads. Normal heart size and mediastinal contours. No acute infiltrate or edema. No effusion or pneumothorax. No acute osseous findings. Bulky degenerative endplate spurring. IMPRESSION: Negative for pneumonia. Electronically Signed   By: Tiburcio Pea M.D.   On: 03/23/2022 06:17    Microbiology: Results for orders placed or performed during the hospital encounter of 03/27/22  Blood culture (routine x 2)     Status: None (Preliminary result)   Collection Time: 03/27/22  4:15 PM   Specimen: BLOOD  Result Value Ref Range Status   Specimen Description   Final    BLOOD RIGHT ANTECUBITAL Performed at [facility], [address], [place], [place] [address]    Special Requests   Final    BOTTLES DRAWN AEROBIC AND ANAEROBIC Blood Culture results may not be optimal due to an excessive volume of blood received in culture bottles Performed at [facility], [address] [place], [place] [address]    Culture   Final    NO GROWTH 4 DAYS Performed at [facility], [address] [place], [place] [address]    Report Status PENDING  Incomplete  Blood culture (routine x 2)     Status: None (Preliminary result)   Collection Time: 03/27/22  5:24 PM   Specimen: BLOOD  Result Value Ref Range Status   Specimen Description   Final    BLOOD SITE NOT SPECIFIED Performed at [facility], [address] [place], [place] [address]    Special Requests   Final    BOTTLES DRAWN AEROBIC AND ANAEROBIC Blood Culture adequate volume Performed at [facility], [address], [place], [place] [address]    Culture   Final    NO GROWTH 4  DAYS Performed at [facility], [address], [place], [place] [address]    Report Status PENDING  Incomplete  MRSA Next Gen by PCR, Nasal     Status: None   Collection Time: 03/27/22 11:29 PM   Specimen: Nasal Swab  Result Value Ref Range Status   MRSA by PCR Next Gen NOT DETECTED NOT DETECTED Final    Comment: (NOTE) The GeneXpert MRSA Assay (FDA approved for NASAL specimens only), is one component of a comprehensive MRSA colonization surveillance program. It is not intended to diagnose MRSA infection nor to guide or monitor treatment for MRSA infections. Test performance is not FDA approved in patients less than 79 years old. Performed at [facility], [address], [place], [place] [address]     Labs: CBC: Recent Labs  Lab 03/27/22 1614 03/28/22 0022 03/30/22 0457  WBC 9.9 8.3 10.4  NEUTROABS 7.4  --  7.6  HGB 11.5* 10.8* 12.1  HCT 35.2* 33.0* 36.8  MCV 98.6 98.2 98.1  PLT 407* 383 523*  Basic Metabolic Panel: Recent Labs  Lab 03/27/22 1614 03/28/22 0022 03/30/22 0457  NA 144 142 141  K 4.6 3.7 4.2  CL 107 107 100  CO2 30 28 31   GLUCOSE 108* 99 132*  BUN 10 8 14   CREATININE 0.68 0.69 0.74  CALCIUM 9.1 8.1* 9.0  MG 2.6* 2.3 2.3  PHOS  --   --  4.3   Liver Function Tests: Recent Labs  Lab 03/27/22 1614 03/28/22 0022 03/30/22 0457  AST 23 18 61*  ALT 24 20 66*  ALKPHOS 134* 109 196*  BILITOT 0.4 0.3 0.4  PROT 7.5 6.7 7.4  ALBUMIN 3.0* 2.7* 3.0*   CBG: No results for input(s): "GLUCAP" in the last 168 hours.  Discharge time spent: less than 30 minutes.  Signed: Rickey BarbaraStephen Kamarrion Stfort, MD [facility] 03/31/2022

## 2022-03-31 NOTE — Progress Notes (Signed)
Patient will be discharging home today. Belongings were returned.

## 2022-04-01 LAB — CULTURE, BLOOD (ROUTINE X 2)
Culture: NO GROWTH
Culture: NO GROWTH
Special Requests: ADEQUATE

## 2022-05-18 ENCOUNTER — Encounter: Payer: Self-pay | Admitting: *Deleted

## 2022-05-20 NOTE — Progress Notes (Signed)
Referring:  Levonne Lapping, NP [phone] [place],  [place] [address]  PCP: Lewanda Rife, [place]  Neurology was asked to evaluate Bridget Stevens, a 51 year old female for a chief complaint of headaches.  Our recommendations of care will be communicated by shared medical record.    CC:  headaches  History provided from self  HPI:  Medical co-morbidities: cervical spondylosis s/p C5-6 fusion, asthma, anxiety, migraines  The patient presents for evaluation of headaches which began several years ago. Headache frequency has fluctuated over time. They started to worsen 2-3 years ago. She currently has headaches daily and has been taking Fioricet every day. Also takes Tylenol.  Headaches are described as holocephalic pounding pain with associated photophobia, phonophobia, nausea, and vomiting.   Headache History: Onset: 1990s Triggers: none Aura: black spots Location: holocephalic Quality/Description: pounding Associated Symptoms:  Photophobia: yes  Phonophobia: yes  Nausea: yes Vomiting: yes Worse with activity?: yes Duration of headaches: constant  Headache days per month: 30 Headache free days per month: 0  Current Treatment: Abortive Fioricet  Preventative none  Prior Therapies                                 Imitrex 50 mg PRN - lack of efficacy Prozac  LABS: CBC    Component Value Date/Time   WBC 10.4 03/30/2022 0457   RBC 3.75 (L) 03/30/2022 0457   HGB 12.1 03/30/2022 0457   HCT 36.8 03/30/2022 0457   PLT 523 (H) 03/30/2022 0457   MCV 98.1 03/30/2022 0457   MCH 32.3 03/30/2022 0457   MCHC 32.9 03/30/2022 0457   RDW 13.0 03/30/2022 0457   LYMPHSABS 1.6 03/30/2022 0457   MONOABS 1.0 03/30/2022 0457   EOSABS 0.1 03/30/2022 0457   BASOSABS 0.0 03/30/2022 0457      Latest Ref Rng & Units 03/30/2022    4:57 AM 03/28/2022   12:22 AM 03/27/2022    4:14 PM  CMP  Glucose 70 - 99 mg/dL 546  99  270   BUN 6 - 20 mg/dL 14  8  10    Creatinine 0.44 - 1.00  mg/dL  3.50  0.93   Sodium 135 - 145 mmol/L 141  142  144   Potassium 3.5 - 5.1 mmol/L 4.2  3.7  4.6   Chloride 98 - 111 mmol/L 100  107  107   CO2 22 - 32 mmol/L 31  28  30    Calcium 8.9 - 10.3 mg/dL 9.0  8.1  9.1   Total Protein 6.5 - 8.1 g/dL 7.4  6.7  7.5   Total Bilirubin 0.3 - 1.2 mg/dL 0.4  0.3  0.4   Alkaline Phos 38 - 126 U/L 196  109  134   AST 15 - 41 U/L 61  18  23   ALT 0 - 44 U/L 66  20  24      IMAGING:  MRI C-spine 01/24/22: Good appearance at the previous ACDF level C5-6.   No change in mild degenerative spondylosis at C4-5 and C6-7 compared to the study of April 2021. Mild right foraminal narrowing at C6-7 but without likely neural compression.   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  Current Outpatient Medications on File Prior to Visit  Medication Sig Dispense Refill   acetaminophen (TYLENOL) 500 MG tablet Take  1,000 mg by mouth every 6 (six) hours as needed for moderate pain.      albuterol (PROVENTIL HFA;VENTOLIN HFA) 108 (90 Base) MCG/ACT inhaler Inhale 2 puffs into the lungs every 4 (four) hours as needed for wheezing or shortness of breath.      Butalbital-Acetaminophen 50-300 MG TABS Take 1 tablet by mouth every 6 (six) hours as needed (migraines).     CALCIUM CITRATE PO Take 600 mg by mouth daily.     calcium elemental as carbonate (BARIATRIC TUMS ULTRA) 400 MG chewable tablet Chew 1,000-2,000 mg by mouth 3 (three) times daily as needed for heartburn (acid reflux/indigestion.).     Cholecalciferol (VITAMIN D) 50 MCG (2000 UT) CAPS Take 2,000 Units by mouth every evening.      cycloSPORINE (RESTASIS) 0.05 % ophthalmic emulsion Place 1 drop into both eyes 2 (two) times daily as needed (dry/irritated eyes.).      EPINEPHrine 0.3 mg/0.3 mL IJ SOAJ injection Inject 0.3 mg into the muscle as needed for anaphylaxis.     famotidine (PEPCID) 40 MG  tablet Take 40 mg by mouth at bedtime.     FLAREX 0.1 % ophthalmic suspension Apply 1 drop to eye 2 (two) times daily as needed (itching eyes).     gabapentin (NEURONTIN) 300 MG capsule Take 300 mg by mouth in the morning, at noon, and at bedtime.     methocarbamol (ROBAXIN) 500 MG tablet Take 500 mg by mouth in the morning and at bedtime.     omeprazole (PRILOSEC) 40 MG capsule Take 40 mg by mouth daily.     Pediatric Multiple Vit-C-FA (MULTIVITAMIN ANIMAL SHAPES, WITH CA/FA,) with C & FA chewable tablet Chew 1 tablet by mouth 2 (two) times daily.     polyethylene glycol (MIRALAX / GLYCOLAX) 17 g packet Take 17 g by mouth daily as needed (constipation.).     No current facility-administered medications on file prior to visit.     Allergies: Allergies  Allergen Reactions   Shellfish Allergy Anaphylaxis   Peanut-Containing Drug Products Swelling    SWELLING REACTION UNSPECIFIED    Sulfa Antibiotics Hives   Advil [Ibuprofen]     Due to gastric surgery   Percocet [Oxycodone-Acetaminophen] Other (See Comments)    PT STATES THAT SHE HAS HEADACHES WITH THIS MED    Family History: Migraine or other headaches in the family:  sister Aneurysms in a first degree relative:  no Brain tumors in the family:  no Other neurological illness in the family:   no  Past Medical History: Past Medical History:  Diagnosis Date   Anxiety    Arthritis    Asthma    GERD (gastroesophageal reflux disease)    Headache    Hepatomegaly    Hx of migraines    Hyperlipidemia    Ovarian cyst    Thyroid disease     Past Surgical History Past Surgical History:  Procedure Laterality Date   ANTERIOR CERVICAL DECOMP/DISCECTOMY FUSION N/A 02/18/2020   Procedure: Anterior Cervical Decompression/Discectomy Fusion - Cervical five-Cervcal six;  Surgeon: Tia Alert, MD;  Location: [facility];  Service: Neurosurgery;  Laterality: N/A;   BACK SURGERY     BTL  07/30/2008   CARPAL TUNNEL RELEASE Right    CARPAL  TUNNEL RELEASE Left 09/22/2016   Procedure: CARPAL TUNNEL RELEASE, left;  Surgeon: Dairl Ponder, MD;  Location: [facility];  Service: Orthopedics;  Laterality: Left;   CESAREAN SECTION     DORSAL COMPARTMENT RELEASE Left  09/22/2016   Procedure: RELEASE DORSAL COMPARTMENT (DEQUERVAIN);  Surgeon: Charlotte Crumb, MD;  Location: [facility];  Service: Orthopedics;  Laterality: Left;   ELBOW SURGERY Right    gastric by pass     HYSTEROSCOPY W/ ENDOMETRIAL ABLATION     KNEE SURGERY Left    meniscus tear and a spur   TUBAL LIGATION     WISDOM TOOTH EXTRACTION      Social History: Social History   Tobacco Use   Smoking status: Never   Smokeless tobacco: Never  Vaping Use   Vaping Use: Never used  Substance Use Topics   Alcohol use: Not Currently   Drug use: No    ROS: Negative for fevers, chills. Positive for headaches. All other systems reviewed and negative unless stated otherwise in HPI.   Physical Exam:   Vital Signs: BP 110/66   Pulse 77   Ht 4\' 10"  (1.473 m)   Wt 141 lb (64 kg)   BMI 29.47 kg/m  GENERAL: well appearing,in no acute distress,alert SKIN:  Color, texture, turgor normal. No rashes or lesions HEAD:  Normocephalic/atraumatic. CV:  RRR RESP: Normal respiratory effort  NEUROLOGICAL: Mental Status: Alert, oriented to person, place and time,Follows commands Cranial Nerves: PERRL, visual fields intact to confrontation, extraocular movements intact, facial sensation intact, no facial droop or ptosis, hearing grossly intact, no dysarthria Motor: muscle strength 5/5 both upper and lower extremities Reflexes: 2+ throughout Sensation: intact to light touch all 4 extremities Coordination: Finger-to- nose-finger intact bilaterally Gait: normal-based   IMPRESSION: 51 year old female with a history of cervical spondylosis s/p C5-6 fusion, asthma, anxiety, migraines who presents for evaluation of worsening migraines. Will order MRI  brain as her headaches have worsened to the point where they are now constant. Her current headache pattern is consistent with chronic migraine and she likely also has a component of medication overuse headache in the setting of daily Fioricet use. Discussed preventive options. Would avoid Topamax for now given elevated LFTs on recent blood work. She does not want to start an antidepressant. Will start propranolol for migraine prevention and Maxalt for rescue. Counseled on limiting rescue medication use to 2 days per week to avoid rebound headaches.  PLAN: -MRI brain -Preventive: Start propranolol 20 mg BID -Rescue: Start Maxalt 5 mg ODT PRN -Counseled on limiting rescue medication use to 2 days per week to avoid rebound headaches   I spent a total of 33 minutes chart reviewing and counseling the patient. Headache education was done. Discussed treatment options including preventive and acute medications. Discussed medication overuse headache and to limit use of acute treatments to no more than 2 days/week or 10 days/month. Discussed medication side effects, adverse reactions and drug interactions. Written educational materials and patient instructions outlining all of the above were given.  Follow-up: 3 months   Genia Harold, MD 05/21/2022   9:51 AM

## 2022-05-21 ENCOUNTER — Telehealth: Payer: Self-pay | Admitting: Psychiatry

## 2022-05-21 ENCOUNTER — Encounter: Payer: Self-pay | Admitting: Psychiatry

## 2022-05-21 ENCOUNTER — Ambulatory Visit: Payer: Medicaid Other | Admitting: Psychiatry

## 2022-05-21 VITALS — BP 110/66 | HR 77 | Ht <= 58 in | Wt 141.0 lb

## 2022-05-21 DIAGNOSIS — G43101 Migraine with aura, not intractable, with status migrainosus: Secondary | ICD-10-CM | POA: Diagnosis not present

## 2022-05-21 DIAGNOSIS — R519 Headache, unspecified: Secondary | ICD-10-CM | POA: Diagnosis not present

## 2022-05-21 MED ORDER — LORAZEPAM 0.5 MG PO TABS: ORAL_TABLET | ORAL | 0 refills | Status: DC

## 2022-05-21 MED ORDER — PROPRANOLOL HCL 20 MG PO TABS: 20.0000 mg | ORAL_TABLET | Freq: Two times a day (BID) | ORAL | 6 refills | Status: DC

## 2022-05-21 MED ORDER — RIZATRIPTAN BENZOATE 5 MG PO TBDP: 5.0000 mg | ORAL_TABLET | ORAL | 6 refills | Status: DC | PRN

## 2022-05-21 NOTE — Patient Instructions (Addendum)
Plan: -MRI of the brain. Can take 1-2 pills of ativan 30 minutes prior to MRI -Start propranolol 20 mg twice a day for headache prevention -Start rizatriptan as needed for migraines. Take at onset of migraine and can repeat a dose in 2 hour sif headache persists. Max dose 2 pills in 24 hours. Please limit to 2 days per week to avoid rebound headaches. -Stop butalbital

## 2022-05-21 NOTE — Telephone Encounter (Signed)
Pt states she has been called by her insurance to inform her that LORazepam (ATIVAN) 0.5 MG tablet, is not covered by her insurance, please call.

## 2022-05-24 NOTE — Telephone Encounter (Signed)
Called [facility], spoke with pharmacist and asked if patient can use [facility]. She stated they applied a discount card and she picked it up on 05/22/22.

## 2022-06-02 ENCOUNTER — Telehealth: Payer: Self-pay | Admitting: Psychiatry

## 2022-06-02 NOTE — Telephone Encounter (Signed)
[employer]: [id number] exp. 06/02/22-07/24/22 sent to [facility]

## 2022-06-07 ENCOUNTER — Ambulatory Visit: Payer: Medicaid Other | Admitting: Psychiatry

## 2022-06-16 ENCOUNTER — Ambulatory Visit (HOSPITAL_COMMUNITY)
Admission: RE | Admit: 2022-06-16 | Discharge: 2022-06-16 | Disposition: A | Discharge status: Home / Self Care | Payer: Medicaid Other | Source: Ambulatory Visit | Attending: Psychiatry | Admitting: Psychiatry

## 2022-06-16 DIAGNOSIS — R519 Headache, unspecified: Secondary | ICD-10-CM | POA: Insufficient documentation

## 2022-06-16 MED ORDER — GADOBUTROL 1 MMOL/ML IV SOLN
6.5000 mL | Freq: Once | INTRAVENOUS | Status: AC | PRN
Start: 2022-06-16 — End: 2022-06-16
  Administered 2022-06-16: 6.5 mL via INTRAVENOUS

## 2022-06-17 ENCOUNTER — Telehealth: Payer: Self-pay | Admitting: Psychiatry

## 2022-06-17 NOTE — Telephone Encounter (Signed)
Spoke with patient and informed her of Dr Quentin Mulling result note in detail. Answered her questions to her stated satisfaction, she did report that for a year she has occasionally had slurred speech noticed by her family. I reviewed stroke symptoms with her and advised if any of these occur she needs to go immediately to ED reassured her the MRI did not show old or new strokes. Patient verbalized understanding, appreciation.

## 2022-06-17 NOTE — Telephone Encounter (Signed)
Pt is asking for a call with an explanation of the MRI results. 

## 2022-06-17 NOTE — Telephone Encounter (Signed)
Brain MRI is essentially normal. The report mentions "scattered FLAIR signal abnormality", which are small spots on the brain that are frequently seen in people who have migraine headaches. They generally do not cause symptoms and do not require further workup.

## 2022-06-30 ENCOUNTER — Other Ambulatory Visit: Payer: Self-pay | Admitting: Physician Assistant

## 2022-06-30 DIAGNOSIS — Z1231 Encounter for screening mammogram for malignant neoplasm of breast: Secondary | ICD-10-CM

## 2022-07-02 ENCOUNTER — Other Ambulatory Visit: Payer: Self-pay | Admitting: Neurological Surgery

## 2022-07-02 DIAGNOSIS — M5416 Radiculopathy, lumbar region: Secondary | ICD-10-CM

## 2022-07-05 ENCOUNTER — Telehealth: Payer: Self-pay | Admitting: Psychiatry

## 2022-07-05 NOTE — Telephone Encounter (Signed)
Pt states over the weekend she went to a grocery store, her vision was blurry.  When making her way to check out she was ringing with sweat, pt felt as if she was going to faint, but did not.  Pt was able to drive home and has not felt that way since(pt did not go to ED or urgent care).  Pt went to her eye Dr this morning and was told it sounds like migraine symptoms that she should report to her Neurologist.

## 2022-07-05 NOTE — Telephone Encounter (Signed)
Pt reports doing better and hasnt had any symptoms since, any suggestions ?

## 2022-07-06 NOTE — Telephone Encounter (Signed)
It's possible it was migraine-related. Did she develop a headache around that time? She is also taking propranolol which can lower her blood pressure and cause lightheadedness. For now I'd recommend making sure she stays hydrated. If the symptoms reoccur we may consider switching her to a different preventive medication

## 2022-07-18 ENCOUNTER — Ambulatory Visit
Admission: RE | Admit: 2022-07-18 | Discharge: 2022-07-18 | Disposition: A | Discharge status: Home / Self Care | Payer: Medicaid Other | Source: Ambulatory Visit | Attending: Neurological Surgery | Admitting: Neurological Surgery

## 2022-07-18 DIAGNOSIS — M5416 Radiculopathy, lumbar region: Secondary | ICD-10-CM

## 2022-08-04 ENCOUNTER — Ambulatory Visit
Admission: RE | Admit: 2022-08-04 | Discharge: 2022-08-04 | Disposition: A | Discharge status: Home / Self Care | Payer: Medicaid Other | Source: Ambulatory Visit | Attending: Physician Assistant | Admitting: Physician Assistant

## 2022-08-04 DIAGNOSIS — Z1231 Encounter for screening mammogram for malignant neoplasm of breast: Secondary | ICD-10-CM

## 2022-08-19 NOTE — Progress Notes (Signed)
Referring:  Karie Soda, [place],  [place] [address]  PCP: Karie Soda, [place]  Neurology was asked to evaluate Sherran Margolis, a 51 year old female for a chief complaint of headaches.  Our recommendations of care will be communicated by shared medical record.    CC:  headaches  History provided from self  HPI:   Consult visit 05/21/2022 Dr. Billey Gosling: Medical co-morbidities: cervical spondylosis s/p C5-6 fusion, asthma, anxiety, migraines  The patient presents for evaluation of headaches which began several years ago. Headache frequency has fluctuated over time. They started to worsen 2-3 years ago. She currently has headaches daily and has been taking Fioricet every day. Also takes Tylenol.  Headaches are described as holocephalic pounding pain with associated photophobia, phonophobia, nausea, and vomiting.    Update 08/23/2022 JM: Patient returns for 37-month migraine follow-up unaccompanied.  Completed MRI brain which was essentially normal.  She continues to experience persistent migraine headaches.  She has remained on propranolol but is currently only taking 10 mg twice daily (was initially prescribed 20 mg twice daily) due to low blood pressure, she also complains of weight gain since initiating. Will use Maxalt occasionally with some benefit but unable to use consistently at migraine onset as this medication makes her sleepy.     Headache History: Onset: 1990s Triggers: none Aura: black spots Location: holocephalic Quality/Description: pounding Associated Symptoms:  Photophobia: yes  Phonophobia: yes  Nausea: yes Vomiting: yes Worse with activity?: yes Duration of headaches: constant  Headache days per month: 30 Headache free days per month: 0  Current Treatment: Abortive Maxalt  Preventative Propranolol  Prior Therapies                                 Imitrex 50 mg PRN - lack of efficacy Rizatriptan Propranolol Prozac  LABS: CBC     Component Value Date/Time   WBC 10.4 03/30/2022 0457   RBC 3.75 (L) 03/30/2022 0457   HGB 12.1 03/30/2022 0457   HCT 36.8 03/30/2022 0457   PLT 523 (H) 03/30/2022 0457   MCV 98.1 03/30/2022 0457   MCH 32.3 03/30/2022 0457   MCHC 32.9 03/30/2022 0457   RDW 13.0 03/30/2022 0457   LYMPHSABS 1.6 03/30/2022 0457   MONOABS 1.0 03/30/2022 0457   EOSABS 0.1 03/30/2022 0457   BASOSABS 0.0 03/30/2022 0457      Latest Ref Rng & Units 03/30/2022    4:57 AM 03/28/2022   12:22 AM 03/27/2022    4:14 PM  CMP  Glucose 70 - 99 mg/dL 132  99  108   BUN 6 - 20 mg/dL 14  8  10    Creatinine 0.44 - 1.00 mg/dL 0.74  0.69  0.68   Sodium 135 - 145 mmol/L 141  142  144   Potassium 3.5 - 5.1 mmol/L 4.2  3.7  4.6   Chloride 98 - 111 mmol/L 100  107  107   CO2 22 - 32 mmol/L 31  28  30    Calcium 8.9 - 10.3 mg/dL 9.0  8.1  9.1   Total Protein 6.5 - 8.1 g/dL 7.4  6.7  7.5   Total Bilirubin 0.3 - 1.2 mg/dL 0.4  0.3  0.4   Alkaline Phos 38 - 126 U/L 196  109  134   AST 15 - 41 U/L 61  18  23   ALT 0 - 44 U/L 66  20  24      IMAGING:  MRI C-spine 01/24/22: Good appearance at the previous ACDF level C5-6.   No change in mild degenerative spondylosis at C4-5 and C6-7 compared to the study of April 2021. Mild right foraminal narrowing at C6-7 but without likely neural compression.   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  Current Outpatient Medications on File Prior to Visit  Medication Sig Dispense Refill   acetaminophen (TYLENOL) 500 MG tablet Take 1,000 mg by mouth every 6 (six) hours as needed for moderate pain.      albuterol (PROVENTIL HFA;VENTOLIN HFA) 108 (90 Base) MCG/ACT inhaler Inhale 2 puffs into the lungs every 4 (four) hours as needed for wheezing or shortness of breath.      CALCIUM CITRATE PO Take 600 mg by mouth daily.     calcium elemental as carbonate (BARIATRIC TUMS ULTRA) 400  MG chewable tablet Chew 1,000-2,000 mg by mouth 3 (three) times daily as needed for heartburn (acid reflux/indigestion.).     Cholecalciferol (VITAMIN D) 50 MCG (2000 UT) CAPS Take 2,000 Units by mouth every evening.      cycloSPORINE (RESTASIS) 0.05 % ophthalmic emulsion Place 1 drop into both eyes 2 (two) times daily as needed (dry/irritated eyes.).      EPINEPHrine 0.3 mg/0.3 mL IJ SOAJ injection Inject 0.3 mg into the muscle as needed for anaphylaxis.     famotidine (PEPCID) 40 MG tablet Take 40 mg by mouth at bedtime.     FLAREX 0.1 % ophthalmic suspension Apply 1 drop to eye 2 (two) times daily as needed (itching eyes).     gabapentin (NEURONTIN) 300 MG capsule Take 300 mg by mouth in the morning, at noon, and at bedtime.     methocarbamol (ROBAXIN) 500 MG tablet Take 500 mg by mouth in the morning and at bedtime.     omeprazole (PRILOSEC) 40 MG capsule Take 40 mg by mouth daily.     Pediatric Multiple Vit-C-FA (MULTIVITAMIN ANIMAL SHAPES, WITH CA/FA,) with C & FA chewable tablet Chew 1 tablet by mouth 2 (two) times daily.     polyethylene glycol (MIRALAX / GLYCOLAX) 17 g packet Take 17 g by mouth daily as needed (constipation.).     No current facility-administered medications on file prior to visit.     Allergies: Allergies  Allergen Reactions   Shellfish Allergy Anaphylaxis   Peanut-Containing Drug Products Swelling    SWELLING REACTION UNSPECIFIED    Sulfa Antibiotics Hives   Advil [Ibuprofen]     Due to gastric surgery   Percocet [Oxycodone-Acetaminophen] Other (See Comments)    PT STATES THAT SHE HAS HEADACHES WITH THIS MED    Family History: Migraine or other headaches in the family:  sister Aneurysms in a first degree relative:  no Brain tumors in the family:  no Other neurological illness in the family:   no  Past Medical History: Past Medical History:  Diagnosis Date   Anxiety    Arthritis    Asthma    GERD (gastroesophageal reflux disease)    Headache     Hepatomegaly    Hx of migraines    Hyperlipidemia    Ovarian cyst    Thyroid disease     Past Surgical History Past Surgical History:  Procedure Laterality Date   ANTERIOR CERVICAL DECOMP/DISCECTOMY FUSION N/A 02/18/2020   Procedure: Anterior Cervical Decompression/Discectomy Fusion - Cervical five-Cervcal six;  Surgeon: Yetta Barre,  Kermit Balo, MD;  Location: [facility] OR;  Service: Neurosurgery;  Laterality: N/A;   BACK SURGERY     BTL  07/30/2008   CARPAL TUNNEL RELEASE Right    CARPAL TUNNEL RELEASE Left 09/22/2016   Procedure: CARPAL TUNNEL RELEASE, left;  Surgeon: Dairl Ponder, MD;  Location: [facility];  Service: Orthopedics;  Laterality: Left;   CESAREAN SECTION     DORSAL COMPARTMENT RELEASE Left 09/22/2016   Procedure: RELEASE DORSAL COMPARTMENT (DEQUERVAIN);  Surgeon: Dairl Ponder, MD;  Location: [facility];  Service: Orthopedics;  Laterality: Left;   ELBOW SURGERY Right    gastric by pass     HYSTEROSCOPY W/ ENDOMETRIAL ABLATION     KNEE SURGERY Left    meniscus tear and a spur   TUBAL LIGATION     WISDOM TOOTH EXTRACTION      Social History: Social History   Tobacco Use   Smoking status: Never   Smokeless tobacco: Never  Vaping Use   Vaping Use: Never used  Substance Use Topics   Alcohol use: Not Currently   Drug use: No    ROS: Negative for fevers, chills. Positive for headaches. All other systems reviewed and negative unless stated otherwise in HPI.   Physical Exam:   Vital Signs: BP 106/64   Pulse (!) 50   Ht 4\' 10"  (1.473 m)   Wt 143 lb (64.9 kg)   BMI 29.89 kg/m  GENERAL: well appearing very pleasant middle-age Caucasian female,in no acute distress,alert SKIN:  Color, texture, turgor normal. No rashes or lesions HEAD:  Normocephalic/atraumatic. CV:  RRR RESP: Normal respiratory effort  NEUROLOGICAL: Mental Status: Alert, oriented to person, place and time,Follows commands Cranial Nerves: PERRL, visual fields intact  to confrontation, extraocular movements intact, facial sensation intact, no facial droop or ptosis, hearing grossly intact, no dysarthria Motor: muscle strength 5/5 both upper and lower extremities Reflexes: 2+ throughout Sensation: intact to light touch all 4 extremities Coordination: Finger-to- nose-finger intact bilaterally Gait: normal-based     IMPRESSION: 51 year old female with a history of cervical spondylosis s/p C5-6 fusion, asthma, anxiety, migraines who presents for evaluation of worsening migraines, initially evaluated by Dr. 40 on 05/21/2022.  MRI brain essentially normal but did show scattered FLAIR signal abnormality which can be seen with migraine headaches.  She continues to experience daily migraine headaches limited benefit on propranolol and issues with hypotension and weight gain. Would avoid Topamax for now given elevated LFTs on recent blood work.  Limited benefit with rizatriptan due to fatigue after taking.    PLAN: -Preventive: Start nortriptyline 10 mg nightly, advised to call after 3-4 weeks if no benefit or sooner if difficulty tolerating. Stop propranolol due to above noted side effects -Rescue: start naratriptan 2.5 mg as needed  -Counseled on limiting rescue medication use to 2 days per week to avoid rebound headaches    Follow-up in 3 months or call earlier if needed    I spent 26 minutes of face-to-face and non-face-to-face time with patient.  This included previsit chart review, lab review, study review, order entry, electronic health record documentation, patient education and discussion regarding migraine headaches and treatment plan and answered all other questions to patient satisfaction  07/21/2022, [facility]  [facility] [address] [place], [place]  Phone [phone] Fax [phone] Note: This document was prepared with digital dictation and possible smart phrase technology. Any  transcriptional errors that result from this process are unintentional.

## 2022-08-23 ENCOUNTER — Encounter: Payer: Self-pay | Admitting: Adult Health

## 2022-08-23 ENCOUNTER — Ambulatory Visit: Payer: Medicaid Other | Admitting: Adult Health

## 2022-08-23 ENCOUNTER — Telehealth: Payer: Self-pay | Admitting: *Deleted

## 2022-08-23 VITALS — BP 106/64 | HR 50 | Ht <= 58 in | Wt 143.0 lb

## 2022-08-23 DIAGNOSIS — G43101 Migraine with aura, not intractable, with status migrainosus: Secondary | ICD-10-CM | POA: Diagnosis not present

## 2022-08-23 MED ORDER — NARATRIPTAN HCL 2.5 MG PO TABS: 2.5000 mg | ORAL_TABLET | ORAL | 11 refills | Status: DC | PRN

## 2022-08-23 MED ORDER — NORTRIPTYLINE HCL 10 MG PO CAPS: 10.0000 mg | ORAL_CAPSULE | Freq: Every day | ORAL | 3 refills | Status: DC

## 2022-08-23 NOTE — Patient Instructions (Addendum)
Your Plan:  Stop propranolol at this time due to low blood pressure and heart rate  Start nortriptyline 10mg  nightly for headaches - please let me know after a 3-4 weeks if no benefit or sooner if difficulty tolerating   Start naratriptan - will use this at migraine onset - please let me know if you have any difficulty tolerating or is not beneficial to stop your migraine headaches     Follow up in 3 months or call earlier if needed     Thank you for coming to see [place] at [facility]. I hope we have been able to provide you high quality care today.  You may receive a patient satisfaction survey over the next few weeks. We would appreciate your feedback and comments so that we may continue to improve ourselves and the health of our patients.     Nortriptyline Capsules What is this medication? NORTRIPTYLINE (nor TRIP ti leen) treats depression. It increases the amount of serotonin and norepinephrine in the brain, substances that help regulate mood. It belongs to a group of medications called tricyclic antidepressants (TCAs). This medicine may be used for other purposes; ask your health care provider or pharmacist if you have questions. COMMON BRAND NAME(S): Aventyl, Pamelor What should I tell my care team before I take this medication? They need to know if you have any of these conditions: Bipolar disorder Brugada syndrome Difficulty passing urine Glaucoma Heart disease If you drink alcohol Liver disease Schizophrenia Seizures Suicidal thoughts, plans or attempt; a previous suicide attempt by you or a family member Thyroid disease An unusual or allergic reaction to nortriptyline, other tricyclic antidepressants, other medications, foods, dyes, or preservatives Pregnant or trying to get pregnant Breast-feeding How should I use this medication? Take this medication by mouth with a glass of water. Follow the directions on the prescription label. Take your doses  at regular intervals. Do not take it more often than directed. Do not stop taking this medication suddenly except upon the advice of your care team. Stopping this medication too quickly may cause serious side effects or your condition may worsen. A special MedGuide will be given to you by the pharmacist with each prescription and refill. Be sure to read this information carefully each time. Talk to your care team about the use of this medication in children. Special care may be needed. Overdosage: If you think you have taken too much of this medicine contact a poison control center or emergency room at once. NOTE: This medicine is only for you. Do not share this medicine with others. What if I miss a dose? If you miss a dose, take it as soon as you can. If it is almost time for your next dose, take only that dose. Do not take double or extra doses. What may interact with this medication? Do not take this medication with any of the following: Cisapride Dronedarone Linezolid MAOIs, such as Marplan, Nardil, and Parnate Methylene blue (injected into a vein) Pimozide Thioridazine This medication may also interact with the following: Alcohol Antihistamines for allergy, cough, and cold Atropine Buspirone Certain medications for bladder problems, such as oxybutynin or tolterodine Certain medications for depression, such as amitriptyline, fluoxetine, sertraline Certain medications for headaches, such as sumatriptan or rizatriptan Certain medications for Parkinson disease, such as benztropine or trihexyphenidyl Certain medications for stomach problems, such as dicyclomine or hyoscyamine Certain medications for travel sickness, such as scopolamine Chlorpropamide Cimetidine Fentanyl Ipratropium Lithium Other medications that cause heart rhythm changes,  such as dofetilide Quinidine Reserpine St. John's wort Thyroid medication Tramadol Tryptophan This list may not describe all possible  interactions. Give your health care provider a list of all the medicines, herbs, non-prescription drugs, or dietary supplements you use. Also tell them if you smoke, drink alcohol, or use illegal drugs. Some items may interact with your medicine. What should I watch for while using this medication? Tell your care team if your symptoms do not get better or if they get worse. Visit your care team for regular checks on your progress. Because it may take several weeks to see the full effects of this medication, it is important to continue your treatment as prescribed by your care team. Patients and their families should watch out for new or worsening thoughts of suicide or depression. Also watch out for sudden changes in feelings such as feeling anxious, agitated, panicky, irritable, hostile, aggressive, impulsive, severely restless, overly excited and hyperactive, or not being able to sleep. If this happens, especially at the beginning of treatment or after a change in dose, call your care team. You may get drowsy or dizzy. Do not drive, use machinery, or do anything that needs mental alertness until you know how this medication affects you. Do not stand or sit up quickly, especially if you are an older patient. This reduces the risk of dizzy or fainting spells. Alcohol may interfere with the effect of this medication. Avoid alcoholic drinks. Do not treat yourself for coughs, colds, or allergies without asking your care team for advice. Some ingredients can increase possible side effects. Your mouth may get dry. Chewing sugarless gum or sucking hard candy, and drinking plenty of water may help. Contact your care team if the problem does not go away or is severe. This medication may cause dry eyes and blurred vision. If you wear contact lenses you may feel some discomfort. Lubricating drops may help. See your eye doctor if the problem does not go away or is severe. This medication can cause constipation. Try to  have a bowel movement at least every 2 to 3 days. If you do not have a bowel movement for 3 days, call your care team. This medication can make you more sensitive to the sun. Keep out of the sun. If you cannot avoid being in the sun, wear protective clothing and use sunscreen. Do not use sun lamps or tanning beds/booths. What side effects may I notice from receiving this medication? Side effects that you should report to your care team as soon as possible: Allergic reactions--skin rash, itching, hives, swelling of the face, lips, tongue, or throat Heart rhythm changes--fast or irregular heartbeat, dizziness, feeling faint or lightheaded, chest pain, trouble breathing Irritability, confusion, fast or irregular heartbeat, muscle stiffness, twitching muscles, sweating, high fever, seizure, chills, vomiting, diarrhea, which may be signs of serotonin syndrome Seizures Sudden eye pain or change in vision such as blurry vision, seeing halos around lights, vision loss Thoughts of suicide or self-harm, worsening mood, feelings of depression Trouble passing urine Side effects that usually do not require medical attention (report to your care team if they continue or are bothersome): Change in sex drive or performance Constipation Dizziness Drowsiness Dry mouth Tremors or shaking This list may not describe all possible side effects. Call your doctor for medical advice about side effects. You may report side effects to FDA at 1-800-FDA-1088. Where should I keep my medication? Keep out of the reach of children. Store at room temperature between 15 and 30  degrees C (59 and 86 degrees F). Keep container tightly closed. Throw away any unused medication after the expiration date. NOTE: This sheet is a summary. It may not cover all possible information. If you have questions about this medicine, talk to your doctor, pharmacist, or health care provider.  2023 Elsevier/Gold Standard (2021-06-25 00:00:00)

## 2022-08-23 NOTE — Telephone Encounter (Signed)
Naratriptan [place], Key: [id number] , [id number]. Your information has been sent to [place] [employer].

## 2022-08-23 NOTE — Telephone Encounter (Signed)
Naratriptan approved 08/23/22 to 08/23/23. Approval letter faxed to pharmacy.

## 2022-10-14 ENCOUNTER — Other Ambulatory Visit: Payer: Self-pay | Admitting: Adult Health

## 2022-10-25 ENCOUNTER — Other Ambulatory Visit: Payer: Self-pay | Admitting: Nurse Practitioner

## 2022-10-25 DIAGNOSIS — M542 Cervicalgia: Secondary | ICD-10-CM

## 2022-11-03 ENCOUNTER — Ambulatory Visit
Admission: RE | Admit: 2022-11-03 | Discharge: 2022-11-03 | Disposition: A | Discharge status: Home / Self Care | Payer: Medicaid Other | Source: Ambulatory Visit | Attending: Nurse Practitioner | Admitting: Nurse Practitioner

## 2022-11-03 DIAGNOSIS — M542 Cervicalgia: Secondary | ICD-10-CM

## 2022-11-30 NOTE — Progress Notes (Unsigned)
Referring:  Karie Soda, [place],  [place] [id number]  PCP: Karie Soda, [place]  Neurology was asked to evaluate Hanvika Stanley, a 52 year old female for a chief complaint of headaches.  Our recommendations of care will be communicated by shared medical record.    CC:  headaches  History provided from self  Follow-up visit:  Prior visit: 09/02/2022   Brief HPI:   MARIADELROSARI JENSEN is a 52 y.o. female who is being followed for chronic migraine headaches.  Initially seen by Dr. Billey Gosling 05/21/2022.  MRI brain normal.  At prior visit, was started on nortriptyline for preventative and naratriptan for rescue.  Propranolol discontinued due to complaints of hypotension.   Interval history:     Update 08/23/2022 JM: Patient returns for 43-monthmigraine follow-up unaccompanied.  Completed MRI brain which was essentially normal.  She continues to experience persistent migraine headaches.  She has remained on propranolol but is currently only taking 10 mg twice daily (was initially prescribed 20 mg twice daily) due to low blood pressure, she also complains of weight gain since initiating. Will use Maxalt occasionally with some benefit but unable to use consistently at migraine onset as this medication makes her sleepy.     Headache History: Onset: 1990s Triggers: none Aura: black spots Location: holocephalic Quality/Description: pounding Associated Symptoms:  Photophobia: yes  Phonophobia: yes  Nausea: yes Vomiting: yes Worse with activity?: yes Duration of headaches: constant  Headache days per month: 30 Headache free days per month: 0  Current Treatment: Abortive Naratriptan  Preventative Nortriptyline  Prior Therapies                                 Imitrex 50 mg PRN - lack of efficacy Rizatriptan Propranolol Prozac   LABS: CBC    Component Value Date/Time   WBC 10.4 03/30/2022 0457   RBC 3.75 (L) 03/30/2022 0457   HGB 12.1 03/30/2022 0457    HCT 36.8 03/30/2022 0457   PLT 523 (H) 03/30/2022 0457   MCV 98.1 03/30/2022 0457   MCH 32.3 03/30/2022 0457   MCHC 32.9 03/30/2022 0457   RDW 13.0 03/30/2022 0457   LYMPHSABS 1.6 03/30/2022 0457   MONOABS 1.0 03/30/2022 0457   EOSABS 0.1 03/30/2022 0457   BASOSABS 0.0 03/30/2022 0457      Latest Ref Rng & Units 03/30/2022    4:57 AM 03/28/2022   12:22 AM 03/27/2022    4:14 PM  CMP  Glucose 70 - 99 mg/dL 132  99  108   BUN 6 - 20 mg/dL 14  8  10   $ Creatinine 0.44 - 1.00 mg/dL 0.74  0.69  0.68   Sodium 135 - 145 mmol/L 141  142  144   Potassium 3.5 - 5.1 mmol/L 4.2  3.7  4.6   Chloride 98 - 111 mmol/L 100  107  107   CO2 22 - 32 mmol/L 31  28  30   $ Calcium 8.9 - 10.3 mg/dL 9.0  8.1  9.1   Total Protein 6.5 - 8.1 g/dL 7.4  6.7  7.5   Total Bilirubin 0.3 - 1.2 mg/dL 0.4  0.3  0.4   Alkaline Phos 38 - 126 U/L 196  109  134   AST 15 - 41 U/L 61  18  23   ALT 0 - 44 U/L 66  20  24  IMAGING:  MRI C-spine 01/24/22: Good appearance at the previous ACDF level C5-6.   No change in mild degenerative spondylosis at C4-5 and C6-7 compared to the study of April 2021. Mild right foraminal narrowing at C6-7 but without likely neural compression.   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  Current Outpatient Medications on File Prior to Visit  Medication Sig Dispense Refill   acetaminophen (TYLENOL) 500 MG tablet Take 1,000 mg by mouth every 6 (six) hours as needed for moderate pain.      albuterol (PROVENTIL HFA;VENTOLIN HFA) 108 (90 Base) MCG/ACT inhaler Inhale 2 puffs into the lungs every 4 (four) hours as needed for wheezing or shortness of breath.      CALCIUM CITRATE PO Take 600 mg by mouth daily.     calcium elemental as carbonate (BARIATRIC TUMS ULTRA) 400 MG chewable tablet Chew 1,000-2,000 mg by mouth 3 (three) times daily as needed for heartburn (acid  reflux/indigestion.).     Cholecalciferol (VITAMIN D) 50 MCG (2000 UT) CAPS Take 2,000 Units by mouth every evening.      cycloSPORINE (RESTASIS) 0.05 % ophthalmic emulsion Place 1 drop into both eyes 2 (two) times daily as needed (dry/irritated eyes.).      EPINEPHrine 0.3 mg/0.3 mL IJ SOAJ injection Inject 0.3 mg into the muscle as needed for anaphylaxis.     famotidine (PEPCID) 40 MG tablet Take 40 mg by mouth at bedtime.     FLAREX 0.1 % ophthalmic suspension Apply 1 drop to eye 2 (two) times daily as needed (itching eyes).     gabapentin (NEURONTIN) 300 MG capsule Take 300 mg by mouth in the morning, at noon, and at bedtime.     methocarbamol (ROBAXIN) 500 MG tablet Take 500 mg by mouth in the morning and at bedtime.     naratriptan (AMERGE) 2.5 MG tablet Take 1 tablet (2.5 mg total) by mouth as needed for migraine. Take one (1) tablet at onset of headache; if returns or does not resolve, may repeat after 4 hours; do not exceed five (5) mg in 24 hours. 10 tablet 11   nortriptyline (PAMELOR) 10 MG capsule TAKE 1 CAPSULE BY MOUTH AT BEDTIME. 90 capsule 0   omeprazole (PRILOSEC) 40 MG capsule Take 40 mg by mouth daily.     Pediatric Multiple Vit-C-FA (MULTIVITAMIN ANIMAL SHAPES, WITH CA/FA,) with C & FA chewable tablet Chew 1 tablet by mouth 2 (two) times daily.     polyethylene glycol (MIRALAX / GLYCOLAX) 17 g packet Take 17 g by mouth daily as needed (constipation.).     No current facility-administered medications on file prior to visit.     Allergies: Allergies  Allergen Reactions   Shellfish Allergy Anaphylaxis   Peanut-Containing Drug Products Swelling    SWELLING REACTION UNSPECIFIED    Sulfa Antibiotics Hives   Advil [Ibuprofen]     Due to gastric surgery   Percocet [Oxycodone-Acetaminophen] Other (See Comments)    PT STATES THAT SHE HAS HEADACHES WITH THIS MED    Family History: Migraine or other headaches in the family:  sister Aneurysms in a first degree relative:   no Brain tumors in the family:  no Other neurological illness in the family:   no  Past Medical History: Past Medical History:  Diagnosis Date   Anxiety    Arthritis    Asthma    GERD (gastroesophageal reflux disease)  Headache    Hepatomegaly    Hx of migraines    Hyperlipidemia    Ovarian cyst    Thyroid disease     Past Surgical History Past Surgical History:  Procedure Laterality Date   ANTERIOR CERVICAL DECOMP/DISCECTOMY FUSION N/A 02/18/2020   Procedure: Anterior Cervical Decompression/Discectomy Fusion - Cervical five-Cervcal six;  Surgeon: Eustace Moore, MD;  Location: [facility];  Service: Neurosurgery;  Laterality: N/A;   BACK SURGERY     BTL  07/30/2008   CARPAL TUNNEL RELEASE Right    CARPAL TUNNEL RELEASE Left 09/22/2016   Procedure: CARPAL TUNNEL RELEASE, left;  Surgeon: Charlotte Crumb, MD;  Location: [facility];  Service: Orthopedics;  Laterality: Left;   CESAREAN SECTION     DORSAL COMPARTMENT RELEASE Left 09/22/2016   Procedure: RELEASE DORSAL COMPARTMENT (DEQUERVAIN);  Surgeon: Charlotte Crumb, MD;  Location: [facility];  Service: Orthopedics;  Laterality: Left;   ELBOW SURGERY Right    gastric by pass     HYSTEROSCOPY W/ ENDOMETRIAL ABLATION     KNEE SURGERY Left    meniscus tear and a spur   TUBAL LIGATION     WISDOM TOOTH EXTRACTION      Social History: Social History   Tobacco Use   Smoking status: Never   Smokeless tobacco: Never  Vaping Use   Vaping Use: Never used  Substance Use Topics   Alcohol use: Not Currently   Drug use: No    ROS: Negative for fevers, chills. Positive for headaches. All other systems reviewed and negative unless stated otherwise in HPI.   Physical Exam:   Vital Signs: There were no vitals taken for this visit. GENERAL: well appearing very pleasant middle-age Caucasian female,in no acute distress,alert SKIN:  Color, texture, turgor normal. No rashes or lesions HEAD:   Normocephalic/atraumatic. CV:  RRR RESP: Normal respiratory effort  NEUROLOGICAL: Mental Status: Alert, oriented to person, place and time,Follows commands Cranial Nerves: PERRL, visual fields intact to confrontation, extraocular movements intact, facial sensation intact, no facial droop or ptosis, hearing grossly intact, no dysarthria Motor: muscle strength 5/5 both upper and lower extremities Reflexes: 2+ throughout Sensation: intact to light touch all 4 extremities Coordination: Finger-to- nose-finger intact bilaterally Gait: normal-based     IMPRESSION: 52 year old female with a history of cervical spondylosis s/p C5-6 fusion, asthma, anxiety, migraines who presents for evaluation of worsening migraines, initially evaluated by Dr. Billey Gosling on 05/21/2022.  MRI brain essentially normal but did show scattered FLAIR signal abnormality which can be seen with migraine headaches.  She continues to experience daily migraine headaches limited benefit on propranolol and issues with hypotension and weight gain. Would avoid Topamax for now given elevated LFTs on recent blood work.  Limited benefit with rizatriptan due to fatigue after taking.    PLAN: -Preventive: Start nortriptyline 10 mg nightly, advised to call after 3-4 weeks if no benefit or sooner if difficulty tolerating. Stop propranolol due to above noted side effects -Rescue: start naratriptan 2.5 mg as needed  -Counseled on limiting rescue medication use to 2 days per week to avoid rebound headaches    Follow-up in 3 months or call earlier if needed    I spent 26 minutes of face-to-face and non-face-to-face time with patient.  This included previsit chart review, lab review, study review, order entry, electronic health record documentation, patient education and discussion regarding migraine headaches and treatment plan and answered all other questions to patient satisfaction  Frann Rider, AGNP-BC  [facility] [address] [place], [place] [address]  Phone [phone] Fax [phone] Note: This document was prepared with digital dictation and possible smart phrase technology. Any transcriptional errors that result from this process are unintentional.

## 2022-12-01 ENCOUNTER — Encounter: Payer: Self-pay | Admitting: Adult Health

## 2022-12-01 ENCOUNTER — Ambulatory Visit: Payer: Medicaid Other | Admitting: Adult Health

## 2022-12-01 VITALS — BP 106/68 | HR 75 | Ht <= 58 in | Wt 144.0 lb

## 2022-12-01 DIAGNOSIS — G43101 Migraine with aura, not intractable, with status migrainosus: Secondary | ICD-10-CM | POA: Diagnosis not present

## 2022-12-01 MED ORDER — NORTRIPTYLINE HCL 25 MG PO CAPS: 25.0000 mg | ORAL_CAPSULE | Freq: Every day | ORAL | 5 refills | Status: DC

## 2022-12-01 NOTE — Patient Instructions (Addendum)
Your Plan:  Increase nortriptyline 46m nightly for migraine prevention  Continue naratriptan as needed    Follow up in 6 months or call earlier if needed      Thank you for [facility]. I hope we have been able to provide you high quality care today.  You may receive a patient satisfaction survey over the next few weeks. We would appreciate your feedback and comments so that we may continue to improve ourselves and the health of our patients.

## 2022-12-15 ENCOUNTER — Other Ambulatory Visit: Payer: Self-pay

## 2022-12-15 HISTORY — PX: CYST EXCISION: SHX5701

## 2022-12-24 ENCOUNTER — Other Ambulatory Visit: Payer: Self-pay | Admitting: Adult Health

## 2023-01-20 ENCOUNTER — Ambulatory Visit: Payer: Medicaid Other | Admitting: Cardiology

## 2023-01-20 ENCOUNTER — Encounter: Payer: Self-pay | Admitting: Cardiology

## 2023-01-20 VITALS — BP 134/81 | HR 77 | Ht <= 58 in | Wt 145.0 lb

## 2023-01-20 DIAGNOSIS — I951 Orthostatic hypotension: Secondary | ICD-10-CM

## 2023-01-20 NOTE — Progress Notes (Signed)
ID:  Bridget Stevens, DOB 06/08/1971, MRN [id number]  PCP:  Karie Soda, PA  Cardiologist:  Rex Kras, DO, [facility] (established care 01/20/2023)  REASON FOR CONSULT: Orthostatic hypotension  REQUESTING PHYSICIAN:  Karie Soda, [place],  [place] [address]  Chief Complaint  Patient presents with   Orthostatic Hypertension   New Patient (Initial Visit)    HPI  Bridget Stevens is a 52 y.o. Caucasian female who presents to the clinic for evaluation of orthostatic hypotension at the request of [place], [place], [place]. Her past medical history and cardiovascular risk factors include: History of migraines, carpal tunnel release bilateral, neuropathy.  Patient was referred to the practice for evaluation and management of orthostatic hypotension.  She has been noticing that over the last 1 month that her blood pressures have been more labile and at times lower than usual.  When she stands up quickly she is noticing lightheaded and dizziness.  She has not had any syncopal events.  She did discuss this further with PCP who recommended increasing salt and staying hydrated.  Patient states that she has a tendency of not consuming much water and feels dehydrated at times.  No new medications or change in medical history/comorbidities.  No family history of premature coronary disease, cardiomyopathy, or sudden cardiac death.  FUNCTIONAL STATUS: No structured exercise program or daily routine.   ALLERGIES: Allergies  Allergen Reactions   Shellfish Allergy Anaphylaxis   Peanut-Containing Drug Products Swelling    SWELLING REACTION UNSPECIFIED    Sulfa Antibiotics Hives   Ibuprofen Other (See Comments)    Due to gastric surgery   Oxycodone-Acetaminophen Other (See Comments)   Sulfur Other (See Comments)   Triamcinolone Other (See Comments)   Percocet [Oxycodone-Acetaminophen] Other (See Comments)    PT STATES THAT SHE HAS HEADACHES WITH THIS MED    MEDICATION LIST  PRIOR TO VISIT: Current Meds  Medication Sig   acetaminophen (TYLENOL) 500 MG tablet Take 1,000 mg by mouth every 6 (six) hours as needed for moderate pain.    albuterol (PROVENTIL HFA;VENTOLIN HFA) 108 (90 Base) MCG/ACT inhaler Inhale 2 puffs into the lungs every 4 (four) hours as needed for wheezing or shortness of breath.    CALCIUM CITRATE PO Take 600 mg by mouth daily.   calcium elemental as carbonate (BARIATRIC TUMS ULTRA) 400 MG chewable tablet Chew 1,000-2,000 mg by mouth 3 (three) times daily as needed for heartburn (acid reflux/indigestion.).   Cholecalciferol (VITAMIN D) 50 MCG (2000 UT) CAPS Take 2,000 Units by mouth every evening.    desonide (DESOWEN) 0.05 % ointment Apply 1 Application topically 2 (two) times daily.   EPINEPHrine 0.3 mg/0.3 mL IJ SOAJ injection Inject 0.3 mg into the muscle as needed for anaphylaxis.   famotidine (PEPCID) 40 MG tablet Take 40 mg by mouth at bedtime.   FLAREX 0.1 % ophthalmic suspension Apply 1 drop to eye 2 (two) times daily as needed (itching eyes).   gabapentin (NEURONTIN) 300 MG capsule Take 300 mg by mouth in the morning, at noon, and at bedtime.   Lifitegrast (XIIDRA) 5 % SOLN Apply to eye.   LORazepam (ATIVAN) 0.5 MG tablet Take 0.5 mg by mouth as needed.   methocarbamol (ROBAXIN) 500 MG tablet Take 500 mg by mouth in the morning and at bedtime.   naratriptan (AMERGE) 2.5 MG tablet Take 1 tablet (2.5 mg total) by mouth as needed for migraine. Take one (1) tablet at onset of headache; if returns or does  not resolve, may repeat after 4 hours; do not exceed five (5) mg in 24 hours.   nortriptyline (PAMELOR) 25 MG capsule Take 1 capsule (25 mg total) by mouth at bedtime.   omeprazole (PRILOSEC) 40 MG capsule Take 40 mg by mouth daily.   Pediatric Multiple Vit-C-FA (MULTIVITAMIN ANIMAL SHAPES, WITH CA/FA,) with C & FA chewable tablet Chew 1 tablet by mouth 2 (two) times daily.     PAST MEDICAL HISTORY: Past Medical History:  Diagnosis Date    Anxiety    Arthritis    Asthma    GERD (gastroesophageal reflux disease)    Headache    Hepatomegaly    Hx of migraines    Hyperlipidemia    Ovarian cyst    Thyroid disease     PAST SURGICAL HISTORY: Past Surgical History:  Procedure Laterality Date   ANTERIOR CERVICAL DECOMP/DISCECTOMY FUSION N/A 02/18/2020   Procedure: Anterior Cervical Decompression/Discectomy Fusion - Cervical five-Cervcal six;  Surgeon: Eustace Moore, MD;  Location: [place];  Service: Neurosurgery;  Laterality: N/A;   BACK SURGERY     BTL  07/30/2008   CARPAL TUNNEL RELEASE Right    CARPAL TUNNEL RELEASE Left 09/22/2016   Procedure: CARPAL TUNNEL RELEASE, left;  Surgeon: Charlotte Crumb, MD;  Location: [place];  Service: Orthopedics;  Laterality: Left;   CESAREAN SECTION     CYST EXCISION Right 12/15/2022   DORSAL COMPARTMENT RELEASE Left 09/22/2016   Procedure: RELEASE DORSAL COMPARTMENT (DEQUERVAIN);  Surgeon: Charlotte Crumb, MD;  Location: [place] [facility];  Service: Orthopedics;  Laterality: Left;   ELBOW SURGERY Right    gastric by pass     HYSTEROSCOPY W/ ENDOMETRIAL ABLATION     KNEE SURGERY Left    meniscus tear and a spur   TUBAL LIGATION     WISDOM TOOTH EXTRACTION      FAMILY HISTORY: The patient family history includes Asthma in her brother, father, and sister; Breast cancer in her maternal grandmother; Cancer in her mother and sister; Diabetes in her father; Hyperlipidemia in her father; Hypertension in her father and mother; Thyroid disease in her father, mother, and sister.  SOCIAL HISTORY:  The patient  reports that she has never smoked. She has never used smokeless tobacco. She reports that she does not currently use alcohol. She reports that she does not use drugs.  REVIEW OF SYSTEMS: Review of Systems  Cardiovascular:  Negative for chest pain, claudication, dyspnea on exertion, irregular heartbeat, leg swelling, near-syncope, orthopnea, palpitations,  paroxysmal nocturnal dyspnea and syncope.  Respiratory:  Negative for shortness of breath.   Hematologic/Lymphatic: Negative for bleeding problem.  Musculoskeletal:  Negative for muscle cramps and myalgias.  Neurological:  Positive for dizziness and light-headedness.    PHYSICAL EXAM:    01/20/2023   12:45 PM 12/01/2022    7:52 AM 08/23/2022    8:05 AM  Vitals with BMI  Height 4\' 10"  4\' 10"  4\' 10"   Weight 145 lbs 144 lbs 143 lbs  BMI 30.31 0000000 [id number]  Systolic Q000111Q [id number]  Diastolic 81 68 64  Pulse 77 75 50   Orthostatic VS for the past 72 hrs (Last 3 readings):  Orthostatic BP Patient Position BP Location Cuff Size Orthostatic Pulse  01/20/23 1254 110/84 Standing Left Arm Normal 78  01/20/23 1253 125/81 Sitting Left Arm Normal 71  01/20/23 1247 119/76 Supine Left Arm Normal 66     Physical Exam   CARDIAC DATABASE: EKG: January 20, 2023: Sinus rhythm,  66 bpm, normal axis, without underlying ischemia injury pattern  Echocardiogram: No results found for this or any previous visit from the past 1095 days.    Stress Testing: No results found for this or any previous visit from the past 1095 days.   Heart Catheterization: None  LABORATORY DATA:    Latest Ref Rng & Units 03/30/2022    4:57 AM 03/28/2022   12:22 AM 03/27/2022    4:14 PM  CBC  WBC 4.0 - 10.5 K/uL 10.4  8.3  9.9   Hemoglobin 12.0 - 15.0 g/dL 12.1  10.8  11.5   Hematocrit 36.0 - 46.0 % 36.8  33.0  35.2   Platelets 150 - 400 K/uL 523  383  407        Latest Ref Rng & Units 03/30/2022    4:57 AM 03/28/2022   12:22 AM 03/27/2022    4:14 PM  CMP  Glucose 70 - 99 mg/dL 132  99  108   BUN 6 - 20 mg/dL 14  8  10    Creatinine 0.44 - 1.00 mg/dL 0.74  0.69  0.68   Sodium 135 - 145 mmol/L 141  142  144   Potassium 3.5 - 5.1 mmol/L 4.2  3.7  4.6   Chloride 98 - 111 mmol/L 100  107  107   CO2 22 - 32 mmol/L 31  28  30    Calcium 8.9 - 10.3 mg/dL 9.0  8.1  9.1   Total Protein 6.5 - 8.1 g/dL 7.4  6.7  7.5   Total  Bilirubin 0.3 - 1.2 mg/dL 0.4  0.3  0.4   Alkaline Phos 38 - 126 U/L 196  109  134   AST 15 - 41 U/L 61  18  23   ALT 0 - 44 U/L 66  20  24     Lipid Panel     Component Value Date/Time   CHOL 152 06/12/2021 0000   TRIG 85 06/12/2021 0000   HDL 56 06/12/2021 0000   CHOLHDL 2.7 06/12/2021 0000   LDLCALC 79 06/12/2021 0000    No components found for: "NTPROBNP" No results for input(s): "PROBNP" in the last 8760 hours. No results for input(s): "TSH" in the last 8760 hours.  BMP Recent Labs    03/27/22 1614 03/28/22 0022 03/30/22 0457  NA 144 142 141  K 4.6 3.7 4.2  CL 107 107 100  CO2 30 28 31   GLUCOSE 108* 99 132*  BUN 10 8 14   CREATININE 0.68 0.69 0.74  CALCIUM 9.1 8.1* 9.0  GFRNONAA >60 >60 >60    HEMOGLOBIN A1C No results found for: "HGBA1C", "MPG"  External Labs: Collected: 12/07/2022 provided by primary care A1c 5.5. BUN 11, creatinine 0.73. Sodium 142, potassium 4.2, chloride 101, bicarb 25. AST 29, ALT 25, alkaline phosphatase 96. Hemoglobin 13.5, hematocrit 38.5%. TSH 3.36   IMPRESSION:    ICD-10-CM   1. Orthostatic hypotension  I95.1 EKG 12-Lead    ECHOCARDIOGRAM COMPLETE       RECOMMENDATIONS: RECA VANLOAN is a 52 y.o. Caucasian female whose past medical history and cardiac risk factors include:  History of migraines, carpal tunnel release bilateral, hx of gastric bypass, neuropathy.  Orthostatic hypotension Ongoing for the last 1 month. Orthostatic vital signs negative today. Home blood pressure log reviewed-several numbers are less than 100 mmHg. Advised her to have 3 heart healthy balanced meals for nutrition support specially given history of gastric bypass and exposure to malabsorption. Patient states  that she could do better with increasing fluid/water consumption and she feels dehydrated at times. She is recommended to use compression stockings and plans to get new ones with PCP. Encouraged her to change positions slowly. Advised  her to discuss with her provider with regards to reducing dosages of medications which may also be contributory: Gabapentin 300 mg p.o. 3 times daily, nortriptyline, naratriptan, lorazepam. We discussed initiation of midodrine on prn, shared decision was to hold off for now. Given history of bilateral carpal tunnel, and labile blood pressures, will check echocardiogram with strain imaging to evaluate for amyloidosis.  If cardiac amyloidosis is concerned based on strain imaging will proceed with additional lab work.   Data Reviewed: I have independently reviewed external notes provided by the referring provider as part of this office visit.   I have independently reviewed results of labs, EKG as part of medical decision making. I have ordered the following tests:  Orders Placed This Encounter  Procedures   EKG 12-Lead   ECHOCARDIOGRAM COMPLETE    Evaluate for cardiac amyloidosis, strain imaging    Standing Status:   Future    Standing Expiration Date:   01/20/2024    Order Specific Question:   Where should this test be performed    Answer:   [facility]    Order Specific Question:   Perflutren DEFINITY (image enhancing agent) should be administered unless hypersensitivity or allergy exist    Answer:   Administer Perflutren    Order Specific Question:   Does this study need to be read by the Structural team/Level 3 readers?    Answer:   No    Order Specific Question:   Reason for exam-Echo    Answer:   Other-Full Diagnosis List    Order Specific Question:   Full ICD-10/Reason for Exam    Answer:   Hypotension, unspecified hypotension type [id number]    Order Specific Question:   Other Comments    Answer:   Evaluate for cardiac amyloidosis, strain imaging  I have made no medications changes at today's encounter as noted above.  FINAL MEDICATION LIST END OF ENCOUNTER: No orders of the defined types were placed in this encounter.   There are no discontinued medications.   Current  Outpatient Medications:    acetaminophen (TYLENOL) 500 MG tablet, Take 1,000 mg by mouth every 6 (six) hours as needed for moderate pain. , Disp: , Rfl:    albuterol (PROVENTIL HFA;VENTOLIN HFA) 108 (90 Base) MCG/ACT inhaler, Inhale 2 puffs into the lungs every 4 (four) hours as needed for wheezing or shortness of breath. , Disp: , Rfl:    CALCIUM CITRATE PO, Take 600 mg by mouth daily., Disp: , Rfl:    calcium elemental as carbonate (BARIATRIC TUMS ULTRA) 400 MG chewable tablet, Chew 1,000-2,000 mg by mouth 3 (three) times daily as needed for heartburn (acid reflux/indigestion.)., Disp: , Rfl:    Cholecalciferol (VITAMIN D) 50 MCG (2000 UT) CAPS, Take 2,000 Units by mouth every evening. , Disp: , Rfl:    desonide (DESOWEN) 0.05 % ointment, Apply 1 Application topically 2 (two) times daily., Disp: , Rfl:    EPINEPHrine 0.3 mg/0.3 mL IJ SOAJ injection, Inject 0.3 mg into the muscle as needed for anaphylaxis., Disp: , Rfl:    famotidine (PEPCID) 40 MG tablet, Take 40 mg by mouth at bedtime., Disp: , Rfl:    FLAREX 0.1 % ophthalmic suspension, Apply 1 drop to eye 2 (two) times daily as needed (itching eyes).,  Disp: , Rfl:    gabapentin (NEURONTIN) 300 MG capsule, Take 300 mg by mouth in the morning, at noon, and at bedtime., Disp: , Rfl:    Lifitegrast (XIIDRA) 5 % SOLN, Apply to eye., Disp: , Rfl:    LORazepam (ATIVAN) 0.5 MG tablet, Take 0.5 mg by mouth as needed., Disp: , Rfl:    methocarbamol (ROBAXIN) 500 MG tablet, Take 500 mg by mouth in the morning and at bedtime., Disp: , Rfl:    naratriptan (AMERGE) 2.5 MG tablet, Take 1 tablet (2.5 mg total) by mouth as needed for migraine. Take one (1) tablet at onset of headache; if returns or does not resolve, may repeat after 4 hours; do not exceed five (5) mg in 24 hours., Disp: 10 tablet, Rfl: 11   nortriptyline (PAMELOR) 25 MG capsule, Take 1 capsule (25 mg total) by mouth at bedtime., Disp: 30 capsule, Rfl: 5   omeprazole (PRILOSEC) 40 MG capsule,  Take 40 mg by mouth daily., Disp: , Rfl:    Pediatric Multiple Vit-C-FA (MULTIVITAMIN ANIMAL SHAPES, WITH CA/FA,) with C & FA chewable tablet, Chew 1 tablet by mouth 2 (two) times daily., Disp: , Rfl:   Orders Placed This Encounter  Procedures   EKG 12-Lead   ECHOCARDIOGRAM COMPLETE    There are no Patient Instructions on file for this visit.   --Continue cardiac medications as reconciled in final medication list. --Return in about 8 weeks (around 03/17/2023) for Follow up orthostatic hypotension, echo . or sooner if needed. --Continue follow-up with your primary care physician regarding the management of your other chronic comorbid conditions.  Patient's questions and concerns were addressed to her satisfaction. She voices understanding of the instructions provided during this encounter.   This note was created using a voice recognition software as a result there may be grammatical errors inadvertently enclosed that do not reflect the nature of this encounter. Every attempt is made to correct such errors.  Rex Kras, [place], [facility]  Pager:  [phone] Office: [phone]

## 2023-02-14 ENCOUNTER — Ambulatory Visit (HOSPITAL_COMMUNITY)
Admission: RE | Admit: 2023-02-14 | Discharge: 2023-02-14 | Disposition: A | Discharge status: Home / Self Care | Payer: Medicaid Other | Source: Ambulatory Visit | Attending: Cardiology | Admitting: Cardiology

## 2023-02-14 DIAGNOSIS — E785 Hyperlipidemia, unspecified: Secondary | ICD-10-CM | POA: Insufficient documentation

## 2023-02-14 DIAGNOSIS — I951 Orthostatic hypotension: Secondary | ICD-10-CM | POA: Diagnosis not present

## 2023-02-14 LAB — ECHOCARDIOGRAM COMPLETE
AR max vel: 1.61 cm2
AV Area VTI: 1.55 cm2
AV Area mean vel: 1.6 cm2
AV Mean grad: 4 mmHg
AV Peak grad: 6.7 mmHg
Ao pk vel: 1.29 m/s
Area-P 1/2: 3.6 cm2
Calc EF: 59.8 %
S' Lateral: 2.5 cm
Single Plane A2C EF: 59.5 %
Single Plane A4C EF: 57.7 %

## 2023-02-18 ENCOUNTER — Other Ambulatory Visit: Payer: Self-pay | Admitting: Adult Health

## 2023-02-23 ENCOUNTER — Encounter: Payer: Self-pay | Admitting: Cardiology

## 2023-02-24 ENCOUNTER — Ambulatory Visit (HOSPITAL_COMMUNITY)
Admission: RE | Admit: 2023-02-24 | Discharge: 2023-02-24 | Disposition: A | Discharge status: Home / Self Care | Payer: Medicaid Other | Source: Ambulatory Visit | Attending: Cardiology | Admitting: Cardiology

## 2023-02-24 DIAGNOSIS — R7881 Bacteremia: Secondary | ICD-10-CM

## 2023-02-24 DIAGNOSIS — I959 Hypotension, unspecified: Secondary | ICD-10-CM | POA: Insufficient documentation

## 2023-02-24 DIAGNOSIS — E785 Hyperlipidemia, unspecified: Secondary | ICD-10-CM | POA: Insufficient documentation

## 2023-02-24 LAB — ECHOCARDIOGRAM LIMITED

## 2023-02-24 NOTE — Progress Notes (Signed)
  Echocardiogram 2D Echocardiogram has been performed.  Delcie Roch 02/24/2023, 1:19 PM

## 2023-03-03 NOTE — Progress Notes (Signed)
LMTCB

## 2023-03-03 NOTE — Progress Notes (Signed)
Pt says bp is going high/low and gets dizzy Bp is 120s high but low 99 and that's when she feels dizzy

## 2023-03-07 ENCOUNTER — Encounter: Payer: Self-pay | Admitting: Cardiology

## 2023-03-07 ENCOUNTER — Ambulatory Visit: Payer: Medicaid Other | Admitting: Cardiology

## 2023-03-07 ENCOUNTER — Encounter: Payer: Self-pay | Admitting: Adult Health

## 2023-03-07 VITALS — BP 119/78 | HR 88 | Resp 16 | Ht <= 58 in | Wt 143.8 lb

## 2023-03-07 DIAGNOSIS — Z01810 Encounter for preprocedural cardiovascular examination: Secondary | ICD-10-CM

## 2023-03-07 DIAGNOSIS — I951 Orthostatic hypotension: Secondary | ICD-10-CM

## 2023-03-07 DIAGNOSIS — R072 Precordial pain: Secondary | ICD-10-CM

## 2023-03-07 DIAGNOSIS — K219 Gastro-esophageal reflux disease without esophagitis: Secondary | ICD-10-CM

## 2023-03-07 NOTE — Progress Notes (Signed)
ID:  Bridget Stevens, DOB [birth year], MRN [id number]  PCP:  Lewanda Rife, PA  Cardiologist:  Tessa Lerner, DO, [facility] (established care 01/20/2023)  Date: 03/07/23 Last Office Visit: 01/20/2023  Chief Complaint  Patient presents with   Follow-up    Reevaluation orthostatic hypotension and preop clearance   Chest Pain    HPI  Bridget Stevens is a 52 y.o. Caucasian female whose past medical history and cardiovascular risk factors include: Hx of gastric bypass, History of migraines, carpal tunnel release bilateral, neuropathy.  Was referred to the practice for evaluation of orthostatic hypotension.  Symptoms started approximately a month prior to consultation and she would also have labile numbers at home.  Orthostatic vital signs at the time of consultation were negative.  Other contributing factors could be polypharmacy based on her current medication list.  I had asked her to discuss with her providers to see if it could be further simplified.  She was also encouraged to increase fluid intake and to eat 3 heart healthy meals.  The question for malabsorption given her history of gastric bypass.  Given history of orthostasis and bilateral carpal tunnel surgery she was recommended to have echocardiogram with strain to look for possible amyloidosis.  Echocardiogram was performed at the hospital which notes preserved LVEF, normal diastolic function, normal strain pattern, and no significant valvular heart disease.  Since last office visit, she has intermittent episodes of lightheaded and dizziness.  Yesterday she did feel near syncopal.  She has increased her fluid intake significantly since last office visit; however, is not consuming 3 meals per day.  She does not wear compression stockings she still has to obtain them from PCPs office.  In addition, she complains of chest pain.  Located substernally, sharp, intensity 8 out of 10, lasting 10 to 15 minutes, nonradiating, not brought on by  effort related activities, self-limited, more noticeable at rest (while sitting or standing).   Patient is being considered for left knee replacement at [facility].  Date of surgery to be determined.  Provider Dr. Linna Caprice.  No family history of premature coronary disease, cardiomyopathy, or sudden cardiac death.  FUNCTIONAL STATUS: No structured exercise program or daily routine.   ALLERGIES: Allergies  Allergen Reactions   Shellfish Allergy Anaphylaxis   Peanut-Containing Drug Products Swelling    SWELLING REACTION UNSPECIFIED    Sulfa Antibiotics Hives   Ibuprofen Other (See Comments)    Due to gastric surgery   Oxycodone-Acetaminophen Other (See Comments)   Sulfur Other (See Comments)   Triamcinolone Other (See Comments)   Percocet [Oxycodone-Acetaminophen] Other (See Comments)    PT STATES THAT SHE HAS HEADACHES WITH THIS MED    MEDICATION LIST PRIOR TO VISIT: Current Meds  Medication Sig   acetaminophen (TYLENOL) 500 MG tablet Take 1,000 mg by mouth every 6 (six) hours as needed for moderate pain.    albuterol (PROVENTIL HFA;VENTOLIN HFA) 108 (90 Base) MCG/ACT inhaler Inhale 2 puffs into the lungs every 4 (four) hours as needed for wheezing or shortness of breath.    CALCIUM CITRATE PO Take 600 mg by mouth daily.   Cholecalciferol (VITAMIN D) 50 MCG (2000 UT) CAPS Take 2,000 Units by mouth every evening.    EPINEPHrine 0.3 mg/0.3 mL IJ SOAJ injection Inject 0.3 mg into the muscle as needed for anaphylaxis.   famotidine (PEPCID) 40 MG tablet Take 40 mg by mouth at bedtime.   FLAREX 0.1 % ophthalmic suspension Apply 1 drop to eye 2 (two) times daily  as needed (itching eyes).   gabapentin (NEURONTIN) 300 MG capsule Take 300 mg by mouth in the morning, at noon, and at bedtime.   Lifitegrast (XIIDRA) 5 % SOLN Apply to eye.   methocarbamol (ROBAXIN) 500 MG tablet Take 500 mg by mouth in the morning and at bedtime.   naratriptan (AMERGE) 2.5 MG tablet Take 1 tablet (2.5 mg  total) by mouth as needed for migraine. Take one (1) tablet at onset of headache; if returns or does not resolve, may repeat after 4 hours; do not exceed five (5) mg in 24 hours.   nortriptyline (PAMELOR) 25 MG capsule Take 1 capsule (25 mg total) by mouth at bedtime.   omeprazole (PRILOSEC) 40 MG capsule Take 40 mg by mouth daily.   Pediatric Multiple Vit-C-FA (MULTIVITAMIN ANIMAL SHAPES, WITH CA/FA,) with C & FA chewable tablet Chew 1 tablet by mouth 2 (two) times daily.     PAST MEDICAL HISTORY: Past Medical History:  Diagnosis Date   Anxiety    Arthritis    Asthma    GERD (gastroesophageal reflux disease)    Headache    Hepatomegaly    Hx of migraines    Hyperlipidemia    Ovarian cyst    Thyroid disease     PAST SURGICAL HISTORY: Past Surgical History:  Procedure Laterality Date   ANTERIOR CERVICAL DECOMP/DISCECTOMY FUSION N/A 02/18/2020   Procedure: Anterior Cervical Decompression/Discectomy Fusion - Cervical five-Cervcal six;  Surgeon: Tia Alert, MD;  Location: [facility] OR;  Service: Neurosurgery;  Laterality: N/A;   BACK SURGERY     BTL  07/30/2008   CARPAL TUNNEL RELEASE Right    CARPAL TUNNEL RELEASE Left 09/22/2016   Procedure: CARPAL TUNNEL RELEASE, left;  Surgeon: Dairl Ponder, MD;  Location: [facility];  Service: Orthopedics;  Laterality: Left;   CESAREAN SECTION     CYST EXCISION Right 12/15/2022   DORSAL COMPARTMENT RELEASE Left 09/22/2016   Procedure: RELEASE DORSAL COMPARTMENT (DEQUERVAIN);  Surgeon: Dairl Ponder, MD;  Location: [facility];  Service: Orthopedics;  Laterality: Left;   ELBOW SURGERY Right    gastric by pass     HYSTEROSCOPY W/ ENDOMETRIAL ABLATION     KNEE SURGERY Left    meniscus tear and a spur   TUBAL LIGATION     WISDOM TOOTH EXTRACTION      FAMILY HISTORY: The patient family history includes Asthma in her brother, father, and sister; Breast cancer in her maternal grandmother; Cancer in her mother  and sister; Diabetes in her father; Hyperlipidemia in her father; Hypertension in her father and mother; Thyroid disease in her father, mother, and sister.  SOCIAL HISTORY:  The patient  reports that she has never smoked. She has never used smokeless tobacco. She reports that she does not currently use alcohol. She reports that she does not use drugs.  REVIEW OF SYSTEMS: Review of Systems  Cardiovascular:  Positive for chest pain and near-syncope. Negative for claudication, dyspnea on exertion, irregular heartbeat, leg swelling, orthopnea, palpitations, paroxysmal nocturnal dyspnea and syncope.  Respiratory:  Negative for shortness of breath.   Hematologic/Lymphatic: Negative for bleeding problem.  Musculoskeletal:  Negative for muscle cramps and myalgias.  Neurological:  Positive for dizziness, light-headedness and paresthesias (in the legs).    PHYSICAL EXAM:    03/07/2023   10:37 AM 01/20/2023   12:45 PM 12/01/2022    7:52 AM  Vitals with BMI  Height 4\' 10"  4\' 10"  4\' 10"   Weight 143 lbs 13  oz 145 lbs 144 lbs  BMI 30.06 30.31 30.1  Systolic 119 134 161  Diastolic 78 81 68  Pulse 88 77 75   Orthostatic VS for the past 72 hrs (Last 3 readings):  Orthostatic BP Patient Position BP Location Cuff Size Orthostatic Pulse  03/07/23 1046 97/64 Standing Left Arm Normal 97  03/07/23 1045 125/74 Sitting Left Arm Normal 84  03/07/23 1044 110/68 Supine Left Arm Normal 82     Physical Exam  Constitutional: No distress.  Age appropriate, hemodynamically stable.   Neck: No JVD present.  Cardiovascular: Normal rate, regular rhythm, S1 normal, S2 normal, intact distal pulses and normal pulses. Exam reveals no gallop, no S3 and no S4.  No murmur heard. Pulmonary/Chest: Effort normal and breath sounds normal. No stridor. She has no wheezes. She has no rales.  Abdominal: Soft. Bowel sounds are normal. She exhibits no distension. There is no abdominal tenderness.  Musculoskeletal:        General:  No edema.     Cervical back: Neck supple.  Neurological: She is alert and oriented to person, place, and time. She has intact cranial nerves (2-12).  Skin: Skin is warm and moist.   CARDIAC DATABASE: EKG: January 20, 2023: Sinus rhythm, 66 bpm, normal axis, without underlying ischemia injury pattern.  02/25/2023: Sinus Rhythm, 72bpm, without underlying injury pattern, low voltage in precordial leads. No changes compared to 01/20/2023.   Echocardiogram: February 14, 2023: 1. Left ventricular ejection fraction, by estimation, is 55 to 60%. The left ventricle has normal function. The left ventricle has no regional wall motion abnormalities. Left ventricular diastolic parameters were normal. 2. Right ventricular systolic function is normal. The right ventricular size is normal. There is normal pulmonary artery systolic pressure. The estimated right ventricular systolic pressure is 20.8 mmHg. 3. The mitral valve is normal in structure. Trivial mitral valve regurgitation. 4. The aortic valve is tricuspid. There is mild thickening of the aortic valve. Aortic valve regurgitation is not visualized. Aortic valve sclerosis is present, with no evidence of aortic valve stenosis. 5. The inferior vena cava is normal in size with greater than 50% respiratory variability, suggesting right atrial pressure of 3 mmHg.  Limited echocardiogram 02/24/2023: Average global longitudinal strain -18.1% (normal limits).     Stress Testing: No results found for this or any previous visit from the past 1095 days.   Heart Catheterization: None  LABORATORY DATA:    Latest Ref Rng & Units 03/30/2022    4:57 AM 03/28/2022   12:22 AM 03/27/2022    4:14 PM  CBC  WBC 4.0 - 10.5 K/uL 10.4  8.3  9.9   Hemoglobin 12.0 - 15.0 g/dL 09.6  04.5  40.9   Hematocrit 36.0 - 46.0 % 36.8  33.0  35.2   Platelets 150 - 400 K/uL 523  383  407        Latest Ref Rng & Units 03/30/2022    4:57 AM 03/28/2022   12:22 AM 03/27/2022    4:14 PM   CMP  Glucose 70 - 99 mg/dL 811  99  914   BUN 6 - 20 mg/dL 14  8  10    Creatinine 0.44 - 1.00 mg/dL 7.82  9.56  2.13   Sodium 135 - 145 mmol/L 141  142  144   Potassium 3.5 - 5.1 mmol/L 4.2  3.7  4.6   Chloride 98 - 111 mmol/L 100  107  107   CO2 22 - 32 mmol/L 31  28  30   Calcium 8.9 - 10.3 mg/dL 9.0  8.1  9.1   Total Protein 6.5 - 8.1 g/dL 7.4  6.7  7.5   Total Bilirubin 0.3 - 1.2 mg/dL 0.4  0.3  0.4   Alkaline Phos 38 - 126 U/L 196  109  134   AST 15 - 41 U/L 61  18  23   ALT 0 - 44 U/L 66  20  24     Lipid Panel     Component Value Date/Time   CHOL 152 06/12/2021 0000   TRIG 85 06/12/2021 0000   HDL 56 06/12/2021 0000   CHOLHDL 2.7 06/12/2021 0000   LDLCALC 79 06/12/2021 0000    No components found for: "NTPROBNP" No results for input(s): "PROBNP" in the last 8760 hours. No results for input(s): "TSH" in the last 8760 hours.  BMP Recent Labs    03/27/22 1614 03/28/22 0022 03/30/22 0457  NA 144 142 141  K 4.6 3.7 4.2  CL 107 107 100  CO2 30 28 31   GLUCOSE 108* 99 132*  BUN 10 8 14   CREATININE 0.68 0.69 0.74  CALCIUM 9.1 8.1* 9.0  GFRNONAA >60 >60 >60    HEMOGLOBIN A1C No results found for: "HGBA1C", "MPG"  External Labs: Collected: 12/07/2022 provided by primary care A1c 5.5. BUN 11, creatinine 0.73. Sodium 142, potassium 4.2, chloride 101, bicarb 25. AST 29, ALT 25, alkaline phosphatase 96. Hemoglobin 13.5, hematocrit 38.5%. TSH 3.36   IMPRESSION:    ICD-10-CM   1. Orthostatic hypotension  I95.1     2. Preop cardiovascular exam  Z01.810 PCV MYOCARDIAL PERFUSION WITH LEXISCAN    3. Precordial pain  R07.2 PCV MYOCARDIAL PERFUSION WITH LEXISCAN    EKG 12-Lead    4. Gastroesophageal reflux disease, unspecified whether esophagitis present  K21.9         RECOMMENDATIONS: Bridget Stevens is a 52 y.o. Caucasian female whose past medical history and cardiac risk factors include:  History of migraines, carpal tunnel release bilateral, hx of  gastric bypass, neuropathy.  Orthostatic hypotension Ongoing since March 2023.  Orthostatic vital signs are positive today as compared to prior visit. Has increased her fluid intake but not consuming three meals a day.  Advised her to eat 3 heart healthy meals for nutrition support specially given history of gastric bypass she is at risk for malabsorption. She is not wearing compression stockings - has not gotten new ones from PCP office and does not want them from our office either.  Encouraged her to change positions slowly. Advised her to discuss with her provider with regards to reducing dosages of medications which may also be contributory: Gabapentin 300 mg p.o. 3 times daily, nortriptyline, naratriptan (prn for migraine). Has stopped lorazepam. Encouraged her to increase salt in her diet especially on days when she is experiencing symptoms of orthostasis. Given her history of bilateral carpal tunnel and other neurological comorbidities I have asked her to discuss the role of neurological component to her orthostasis.  Patient states that she will follow-up with her neurology provider  Preop cardiovascular exam:  Precordial pain: Precordial discomfort appears to be noncardiac. Functional capacity is limited. EKG is interpretable. Patient is unable to exercise due to osteoarthritis-she refuses to even attempt. Pharmacological stress test to evaluate for reversible ischemia given reduced functional capacity and precordial discomfort prior to upcoming noncardiac surgery. Further recommendations to follow In the interim, seek medical attention sooner by going to the closest ER via EMS  if the symptoms increase in intensity, frequency, duration, or has typical chest pain as discussed in the office.  Patient verbalized understanding.   FINAL MEDICATION LIST END OF ENCOUNTER: No orders of the defined types were placed in this encounter.   Medications Discontinued During This Encounter   Medication Reason   calcium elemental as carbonate (BARIATRIC TUMS ULTRA) 400 MG chewable tablet    LORazepam (ATIVAN) 0.5 MG tablet    desonide (DESOWEN) 0.05 % ointment      Current Outpatient Medications:    acetaminophen (TYLENOL) 500 MG tablet, Take 1,000 mg by mouth every 6 (six) hours as needed for moderate pain. , Disp: , Rfl:    albuterol (PROVENTIL HFA;VENTOLIN HFA) 108 (90 Base) MCG/ACT inhaler, Inhale 2 puffs into the lungs every 4 (four) hours as needed for wheezing or shortness of breath. , Disp: , Rfl:    CALCIUM CITRATE PO, Take 600 mg by mouth daily., Disp: , Rfl:    Cholecalciferol (VITAMIN D) 50 MCG (2000 UT) CAPS, Take 2,000 Units by mouth every evening. , Disp: , Rfl:    EPINEPHrine 0.3 mg/0.3 mL IJ SOAJ injection, Inject 0.3 mg into the muscle as needed for anaphylaxis., Disp: , Rfl:    famotidine (PEPCID) 40 MG tablet, Take 40 mg by mouth at bedtime., Disp: , Rfl:    FLAREX 0.1 % ophthalmic suspension, Apply 1 drop to eye 2 (two) times daily as needed (itching eyes)., Disp: , Rfl:    gabapentin (NEURONTIN) 300 MG capsule, Take 300 mg by mouth in the morning, at noon, and at bedtime., Disp: , Rfl:    Lifitegrast (XIIDRA) 5 % SOLN, Apply to eye., Disp: , Rfl:    methocarbamol (ROBAXIN) 500 MG tablet, Take 500 mg by mouth in the morning and at bedtime., Disp: , Rfl:    naratriptan (AMERGE) 2.5 MG tablet, Take 1 tablet (2.5 mg total) by mouth as needed for migraine. Take one (1) tablet at onset of headache; if returns or does not resolve, may repeat after 4 hours; do not exceed five (5) mg in 24 hours., Disp: 10 tablet, Rfl: 11   nortriptyline (PAMELOR) 25 MG capsule, Take 1 capsule (25 mg total) by mouth at bedtime., Disp: 30 capsule, Rfl: 5   omeprazole (PRILOSEC) 40 MG capsule, Take 40 mg by mouth daily., Disp: , Rfl:    Pediatric Multiple Vit-C-FA (MULTIVITAMIN ANIMAL SHAPES, WITH CA/FA,) with C & FA chewable tablet, Chew 1 tablet by mouth 2 (two) times daily., Disp: ,  Rfl:   Orders Placed This Encounter  Procedures   PCV MYOCARDIAL PERFUSION WITH LEXISCAN   EKG 12-Lead    There are no Patient Instructions on file for this visit.   --Continue cardiac medications as reconciled in final medication list. --Return in about 4 weeks (around 04/04/2023) for Follow up, Review test results. or sooner if needed. --Continue follow-up with your primary care physician regarding the management of your other chronic comorbid conditions.  Patient's questions and concerns were addressed to her satisfaction. She voices understanding of the instructions provided during this encounter.   This note was created using a voice recognition software as a result there may be grammatical errors inadvertently enclosed that do not reflect the nature of this encounter. Every attempt is made to correct such errors.  Tessa Lerner, [place], [facility]  Pager:  [phone] Office: [phone]

## 2023-03-10 ENCOUNTER — Ambulatory Visit: Payer: Medicaid Other

## 2023-03-10 DIAGNOSIS — Z01810 Encounter for preprocedural cardiovascular examination: Secondary | ICD-10-CM

## 2023-03-10 DIAGNOSIS — R072 Precordial pain: Secondary | ICD-10-CM

## 2023-03-17 ENCOUNTER — Ambulatory Visit: Payer: Medicaid Other | Admitting: Cardiology

## 2023-03-20 ENCOUNTER — Encounter: Payer: Self-pay | Admitting: Cardiology

## 2023-03-21 ENCOUNTER — Telehealth: Payer: Self-pay

## 2023-03-21 NOTE — Telephone Encounter (Signed)
Patient was informed that Dr. Odis Hollingshead has completed and sent her Pre-op clearance to surgeon. Patient verbalized understanding.

## 2023-03-22 NOTE — Progress Notes (Signed)
Called patient to inform hr about her stress test results. Patient understood.

## 2023-03-23 ENCOUNTER — Other Ambulatory Visit: Payer: Self-pay | Admitting: Adult Health

## 2023-03-28 ENCOUNTER — Other Ambulatory Visit: Payer: Self-pay | Admitting: Anesthesiology

## 2023-03-28 ENCOUNTER — Other Ambulatory Visit: Payer: Self-pay | Admitting: Adult Health

## 2023-03-28 NOTE — Telephone Encounter (Signed)
Rx refill requested. Rx was flagged for allergy. Pt has been taking medication for months now. Ok to refill?

## 2023-04-02 MED ORDER — NORTRIPTYLINE HCL 25 MG PO CAPS: 25.0000 mg | ORAL_CAPSULE | Freq: Every day | ORAL | 5 refills | Status: DC

## 2023-04-02 NOTE — Telephone Encounter (Signed)
WID question- I refilled medication for Dr Delena Bali.

## 2023-04-02 NOTE — Addendum Note (Signed)
Addended by: Melvyn Novas on: 04/02/2023 04:29 PM   Modules accepted: Orders

## 2023-04-04 ENCOUNTER — Encounter: Payer: Self-pay | Admitting: Cardiology

## 2023-04-04 ENCOUNTER — Ambulatory Visit: Payer: Medicaid Other | Admitting: Cardiology

## 2023-04-04 VITALS — BP 119/77 | HR 82 | Ht <= 58 in | Wt 143.3 lb

## 2023-04-04 DIAGNOSIS — K219 Gastro-esophageal reflux disease without esophagitis: Secondary | ICD-10-CM

## 2023-04-04 DIAGNOSIS — I951 Orthostatic hypotension: Secondary | ICD-10-CM

## 2023-04-04 DIAGNOSIS — Z01810 Encounter for preprocedural cardiovascular examination: Secondary | ICD-10-CM

## 2023-04-04 DIAGNOSIS — R55 Syncope and collapse: Secondary | ICD-10-CM

## 2023-04-04 NOTE — Progress Notes (Signed)
ID:  Bridget Stevens, DOB [birth year], MRN [id number]  PCP:  Levonne Lapping, NP  Cardiologist:  Tessa Lerner, DO, FACC (established care 01/20/2023)  Date: 04/04/23 Last Office Visit: 03/07/2023  Chief Complaint  Patient presents with   Follow-up    Reevaluation of chest pain, preop, history of orthostasis    HPI  Bridget Stevens is a 52 y.o. Caucasian female whose past medical history and cardiovascular risk factors include: Hx of gastric bypass, History of migraines, carpal tunnel release bilateral, neuropathy.  Referred to the practice for evaluation and management of orthostatic hypotension.  Since establishing care her orthostatic vital signs have at time been positive and negative.  But she has been experiencing labile blood pressure readings at home.  She was encouraged to increase fluid intake and eating 3 balanced meals, wear compression stockings, change positions slowly.  Other contributing factors likely polypharmacy based on her prior medication list and/or malabsorption given her history of gastric bypass.  Patient has not been consuming 3 balanced heart healthy meals, has not been wearing compression stockings.  Given her symptoms of orthostasis and history of carpal tunnel release bilaterally she did undergo echocardiogram with strain imaging in the past results noted below.  At last office visit she also endorsed precordial discomfort and requested a preoperative risk stratification for upcoming left knee replacement surgery with [facility] under the care of Dr. Linna Caprice.  She presents today for follow-up.  Denies anginal chest pain or heart failure symptoms.  She is scheduled for her surgery May 19, 2023.  She intermittently has episodes of dizziness and subjectively complains of stuttered speech.  She has not been evaluated by neurology yet.  FUNCTIONAL STATUS: No structured exercise program or daily routine.   ALLERGIES: Allergies  Allergen Reactions   Shellfish  Allergy Anaphylaxis   Peanut-Containing Drug Products Swelling    SWELLING REACTION UNSPECIFIED    Sulfa Antibiotics Hives   Ibuprofen Other (See Comments)    Due to gastric surgery   Oxycodone-Acetaminophen Other (See Comments)   Sulfur Other (See Comments)   Triamcinolone Other (See Comments)   Percocet [Oxycodone-Acetaminophen] Other (See Comments)    PT STATES THAT SHE HAS HEADACHES WITH THIS MED    MEDICATION LIST PRIOR TO VISIT: Current Meds  Medication Sig   acetaminophen (TYLENOL) 500 MG tablet Take 1,000 mg by mouth every 6 (six) hours as needed for moderate pain.    albuterol (PROVENTIL HFA;VENTOLIN HFA) 108 (90 Base) MCG/ACT inhaler Inhale 2 puffs into the lungs every 4 (four) hours as needed for wheezing or shortness of breath.    CALCIUM CITRATE PO Take 600 mg by mouth daily.   Cholecalciferol (VITAMIN D) 50 MCG (2000 UT) CAPS Take 2,000 Units by mouth every evening.    EPINEPHrine 0.3 mg/0.3 mL IJ SOAJ injection Inject 0.3 mg into the muscle as needed for anaphylaxis.   famotidine (PEPCID) 40 MG tablet Take 40 mg by mouth at bedtime.   FLAREX 0.1 % ophthalmic suspension Apply 1 drop to eye 2 (two) times daily as needed (itching eyes).   gabapentin (NEURONTIN) 300 MG capsule Take 300 mg by mouth in the morning, at noon, and at bedtime.   Lifitegrast (XIIDRA) 5 % SOLN Apply to eye.   methocarbamol (ROBAXIN) 500 MG tablet Take 500 mg by mouth in the morning and at bedtime.   naratriptan (AMERGE) 2.5 MG tablet Take 1 tablet (2.5 mg total) by mouth as needed for migraine. Take one (1) tablet at onset of headache;  if returns or does not resolve, may repeat after 4 hours; do not exceed five (5) mg in 24 hours.   nortriptyline (PAMELOR) 25 MG capsule Take 1 capsule (25 mg total) by mouth at bedtime.   omeprazole (PRILOSEC) 40 MG capsule Take 40 mg by mouth daily.   Pediatric Multiple Vit-C-FA (MULTIVITAMIN ANIMAL SHAPES, WITH CA/FA,) with C & FA chewable tablet Chew 1 tablet by  mouth 2 (two) times daily.     PAST MEDICAL HISTORY: Past Medical History:  Diagnosis Date   Anxiety    Arthritis    Asthma    GERD (gastroesophageal reflux disease)    Headache    Hepatomegaly    Hx of migraines    Hyperlipidemia    Ovarian cyst    Thyroid disease     PAST SURGICAL HISTORY: Past Surgical History:  Procedure Laterality Date   ANTERIOR CERVICAL DECOMP/DISCECTOMY FUSION N/A 02/18/2020   Procedure: Anterior Cervical Decompression/Discectomy Fusion - Cervical five-Cervcal six;  Surgeon: Tia Alert, MD;  Location: [facility];  Service: Neurosurgery;  Laterality: N/A;   BACK SURGERY     BTL  07/30/2008   CARPAL TUNNEL RELEASE Right    CARPAL TUNNEL RELEASE Left 09/22/2016   Procedure: CARPAL TUNNEL RELEASE, left;  Surgeon: Dairl Ponder, MD;  Location: [facility];  Service: Orthopedics;  Laterality: Left;   CESAREAN SECTION     CYST EXCISION Right 12/15/2022   DORSAL COMPARTMENT RELEASE Left 09/22/2016   Procedure: RELEASE DORSAL COMPARTMENT (DEQUERVAIN);  Surgeon: Dairl Ponder, MD;  Location: [facility];  Service: Orthopedics;  Laterality: Left;   ELBOW SURGERY Right    gastric by pass     HYSTEROSCOPY W/ ENDOMETRIAL ABLATION     KNEE SURGERY Left    meniscus tear and a spur   TUBAL LIGATION     WISDOM TOOTH EXTRACTION      FAMILY HISTORY: The patient family history includes Asthma in her brother, father, and sister; Breast cancer in her maternal grandmother; Cancer in her mother and sister; Diabetes in her father; Hyperlipidemia in her father; Hypertension in her father and mother; Thyroid disease in her father, mother, and sister.  SOCIAL HISTORY:  The patient  reports that she has never smoked. She has never used smokeless tobacco. She reports that she does not currently use alcohol. She reports that she does not use drugs.  REVIEW OF SYSTEMS: Review of Systems  Cardiovascular:  Positive for near-syncope. Negative for  chest pain, claudication, dyspnea on exertion, irregular heartbeat, leg swelling, orthopnea, palpitations, paroxysmal nocturnal dyspnea and syncope.  Respiratory:  Negative for shortness of breath.   Hematologic/Lymphatic: Negative for bleeding problem.  Musculoskeletal:  Negative for muscle cramps and myalgias.  Neurological:  Positive for paresthesias (in the legs). Negative for dizziness and light-headedness.    PHYSICAL EXAM:    04/04/2023   11:32 AM 03/07/2023   10:37 AM 01/20/2023   12:45 PM  Vitals with BMI  Height 4\' 10"  4\' 10"  4\' 10"   Weight 143 lbs 5 oz 143 lbs 13 oz 145 lbs  BMI 29.96 30.06 30.31  Systolic 119 119 962  Diastolic 77 78 81  Pulse 82 88 77    Physical Exam  Constitutional: No distress.  Age appropriate, hemodynamically stable.   Neck: No JVD present.  Cardiovascular: Normal rate, regular rhythm, S1 normal, S2 normal, intact distal pulses and normal pulses. Exam reveals no gallop, no S3 and no S4.  No murmur heard. Pulmonary/Chest: Effort normal  and breath sounds normal. No stridor. She has no wheezes. She has no rales.  Abdominal: Soft. Bowel sounds are normal. She exhibits no distension. There is no abdominal tenderness.  Musculoskeletal:        General: No edema.     Cervical back: Neck supple.  Neurological: She is alert and oriented to person, place, and time. She has intact cranial nerves (2-12).  Skin: Skin is warm and moist.   CARDIAC DATABASE: EKG: January 20, 2023: Sinus rhythm, 66 bpm, normal axis, without underlying ischemia injury pattern.  02/25/2023: Sinus Rhythm, 72bpm, without underlying injury pattern, low voltage in precordial leads. No changes compared to 01/20/2023.   Echocardiogram: February 14, 2023: 1. Left ventricular ejection fraction, by estimation, is 55 to 60%. The left ventricle has normal function. The left ventricle has no regional wall motion abnormalities. Left ventricular diastolic parameters were normal. 2. Right  ventricular systolic function is normal. The right ventricular size is normal. There is normal pulmonary artery systolic pressure. The estimated right ventricular systolic pressure is 20.8 mmHg. 3. The mitral valve is normal in structure. Trivial mitral valve regurgitation. 4. The aortic valve is tricuspid. There is mild thickening of the aortic valve. Aortic valve regurgitation is not visualized. Aortic valve sclerosis is present, with no evidence of aortic valve stenosis. 5. The inferior vena cava is normal in size with greater than 50% respiratory variability, suggesting right atrial pressure of 3 mmHg.  Limited echocardiogram 02/24/2023: Average global longitudinal strain -18.1% (normal limits).     Stress Testing: Regadenoson Nuclear stress test 03/10/2023: There is a fixed mild defect in the septal region consistent with gut uptake artifact. No ischemia.  Overall LV systolic function is normal without regional wall motion abnormalities. Stress LV EF: 61%.  Nondiagnostic ECG stress. The heart rate response was consistent with Regadenoson.  No previous exam available for comparison. Low risk.   Heart Catheterization: None  LABORATORY DATA:    Latest Ref Rng & Units 03/30/2022    4:57 AM 03/28/2022   12:22 AM 03/27/2022    4:14 PM  CBC  WBC 4.0 - 10.5 K/uL 10.4  8.3  9.9   Hemoglobin 12.0 - 15.0 g/dL 16.1  09.6  04.5   Hematocrit 36.0 - 46.0 % 36.8  33.0  35.2   Platelets 150 - 400 K/uL 523  383  407        Latest Ref Rng & Units 03/30/2022    4:57 AM 03/28/2022   12:22 AM 03/27/2022    4:14 PM  CMP  Glucose 70 - 99 mg/dL 409  99  811   BUN 6 - 20 mg/dL 14  8  10    Creatinine 0.44 - 1.00 mg/dL 9.14  7.82  9.56   Sodium 135 - 145 mmol/L 141  142  144   Potassium 3.5 - 5.1 mmol/L 4.2  3.7  4.6   Chloride 98 - 111 mmol/L 100  107  107   CO2 22 - 32 mmol/L 31  28  30    Calcium 8.9 - 10.3 mg/dL 9.0  8.1  9.1   Total Protein 6.5 - 8.1 g/dL 7.4  6.7  7.5   Total Bilirubin 0.3 - 1.2  mg/dL 0.4  0.3  0.4   Alkaline Phos 38 - 126 U/L 196  109  134   AST 15 - 41 U/L 61  18  23   ALT 0 - 44 U/L 66  20  24     Lipid  Panel     Component Value Date/Time   CHOL 152 06/12/2021 0000   TRIG 85 06/12/2021 0000   HDL 56 06/12/2021 0000   CHOLHDL 2.7 06/12/2021 0000   LDLCALC 79 06/12/2021 0000    No components found for: "NTPROBNP" No results for input(s): "PROBNP" in the last 8760 hours. No results for input(s): "TSH" in the last 8760 hours.  BMP No results for input(s): "NA", "K", "CL", "CO2", "GLUCOSE", "BUN", "CREATININE", "CALCIUM", "GFRNONAA", "GFRAA" in the last 8760 hours.   HEMOGLOBIN A1C No results found for: "HGBA1C", "MPG"  External Labs: Collected: 12/07/2022 provided by primary care A1c 5.5. BUN 11, creatinine 0.73. Sodium 142, potassium 4.2, chloride 101, bicarb 25. AST 29, ALT 25, alkaline phosphatase 96. Hemoglobin 13.5, hematocrit 38.5%. TSH 3.36   IMPRESSION:    ICD-10-CM   1. Preop cardiovascular exam  Z01.810     2. Near syncope  R55 LONG TERM MONITOR (3-14 DAYS)    3. Orthostatic hypotension  I95.1     4. Gastroesophageal reflux disease, unspecified whether esophagitis present  K21.9         RECOMMENDATIONS: WOODROW NATIVIDAD is a 52 y.o. Caucasian female whose past medical history and cardiac risk factors include:  History of migraines, carpal tunnel release bilateral, hx of gastric bypass, neuropathy.  Preop cardiovascular exam Prior EKG is nonischemic. Echo exam: Preserved LVEF, no significant valvular heart disease, see report for additional details. Stress test: Low risk study. Overall acceptable risk for upcoming noncardiac surgery. Denies precordial discomfort. She continues to experience episodes of near syncope based on symptoms recommend a 7-day Zio patch to evaluate for dysrhythmias. Would like to complete a Zio patch prior to upcoming surgery. Based on her other symptoms I would recommend that she needs at least  medicine clearance prior to upcoming noncardiac surgery and if needed neurology as well.  Both patient and daughter verbalized understanding.  Near syncope Chronic, not progressive.   Overall intensity frequency and duration has not changed. During prior visits her orthostatic vital signs have been positive but not always. Zio patch as discussed above Recommended at least 3 heart healthy meals-due to history of gastric bypass patient states she snacks more than eating meals.  She remains hesitant to wear compression stockings -especially when wearing shorts. Re emphasized the importance of keeping herself well-hydrated, adding salt to diet, changing positions slowly, and to review drug drug interactions with PCP as she is on gabapentin, nortriptyline, naratriptan.   Patient and daughter are concerned about intermittent stuttering.  I have requested that they follow-up with primary and/or neurology for further guidance.  In the interim if she has any new onset of or progressive balance issues, vision changes, facial asymmetry, weakness of arms or legs, or difficulty speaking or understanding (included but not limited to) she should go to closest ER via EMS to rule out stroke.  She should have medicine clearance plus or minus neurology clearance prior to upcoming noncardiac surgery.  My concerns and recommendations conveyed to both patient and daughter.  FINAL MEDICATION LIST END OF ENCOUNTER: No orders of the defined types were placed in this encounter.   There are no discontinued medications.    Current Outpatient Medications:    acetaminophen (TYLENOL) 500 MG tablet, Take 1,000 mg by mouth every 6 (six) hours as needed for moderate pain. , Disp: , Rfl:    albuterol (PROVENTIL HFA;VENTOLIN HFA) 108 (90 Base) MCG/ACT inhaler, Inhale 2 puffs into the lungs every 4 (four) hours as needed  for wheezing or shortness of breath. , Disp: , Rfl:    CALCIUM CITRATE PO, Take 600 mg by mouth daily., Disp:  , Rfl:    Cholecalciferol (VITAMIN D) 50 MCG (2000 UT) CAPS, Take 2,000 Units by mouth every evening. , Disp: , Rfl:    EPINEPHrine 0.3 mg/0.3 mL IJ SOAJ injection, Inject 0.3 mg into the muscle as needed for anaphylaxis., Disp: , Rfl:    famotidine (PEPCID) 40 MG tablet, Take 40 mg by mouth at bedtime., Disp: , Rfl:    FLAREX 0.1 % ophthalmic suspension, Apply 1 drop to eye 2 (two) times daily as needed (itching eyes)., Disp: , Rfl:    gabapentin (NEURONTIN) 300 MG capsule, Take 300 mg by mouth in the morning, at noon, and at bedtime., Disp: , Rfl:    Lifitegrast (XIIDRA) 5 % SOLN, Apply to eye., Disp: , Rfl:    methocarbamol (ROBAXIN) 500 MG tablet, Take 500 mg by mouth in the morning and at bedtime., Disp: , Rfl:    naratriptan (AMERGE) 2.5 MG tablet, Take 1 tablet (2.5 mg total) by mouth as needed for migraine. Take one (1) tablet at onset of headache; if returns or does not resolve, may repeat after 4 hours; do not exceed five (5) mg in 24 hours., Disp: 10 tablet, Rfl: 11   nortriptyline (PAMELOR) 25 MG capsule, Take 1 capsule (25 mg total) by mouth at bedtime., Disp: 30 capsule, Rfl: 5   omeprazole (PRILOSEC) 40 MG capsule, Take 40 mg by mouth daily., Disp: , Rfl:    Pediatric Multiple Vit-C-FA (MULTIVITAMIN ANIMAL SHAPES, WITH CA/FA,) with C & FA chewable tablet, Chew 1 tablet by mouth 2 (two) times daily., Disp: , Rfl:   Orders Placed This Encounter  Procedures   LONG TERM MONITOR (3-14 DAYS)    There are no Patient Instructions on file for this visit.   --Continue cardiac medications as reconciled in final medication list. --Return in about 3 months (around 07/05/2023) for Follow up history of orthostasis, postop.. or sooner if needed. --Continue follow-up with your primary care physician regarding the management of your other chronic comorbid conditions.  Patient's questions and concerns were addressed to her satisfaction. She voices understanding of the instructions provided during  this encounter.   This note was created using a voice recognition software as a result there may be grammatical errors inadvertently enclosed that do not reflect the nature of this encounter. Every attempt is made to correct such errors.  Tessa Lerner, [place], [facility]  Pager:  [phone] Office: [phone]

## 2023-04-06 ENCOUNTER — Ambulatory Visit: Payer: Medicaid Other

## 2023-04-06 DIAGNOSIS — R55 Syncope and collapse: Secondary | ICD-10-CM

## 2023-04-06 NOTE — Progress Notes (Signed)
Thank-you.   Fariha Goto, DO, FACC 

## 2023-04-20 ENCOUNTER — Encounter (HOSPITAL_COMMUNITY): Payer: Self-pay | Admitting: Emergency Medicine

## 2023-04-20 ENCOUNTER — Other Ambulatory Visit: Payer: Self-pay

## 2023-04-20 ENCOUNTER — Ambulatory Visit (INDEPENDENT_AMBULATORY_CARE_PROVIDER_SITE_OTHER): Payer: Medicaid Other

## 2023-04-20 ENCOUNTER — Ambulatory Visit (HOSPITAL_COMMUNITY)
Admission: EM | Admit: 2023-04-20 | Discharge: 2023-04-20 | Disposition: A | Discharge status: Home / Self Care | Payer: Medicaid Other | Attending: Emergency Medicine | Admitting: Emergency Medicine

## 2023-04-20 DIAGNOSIS — S93492A Sprain of other ligament of left ankle, initial encounter: Secondary | ICD-10-CM

## 2023-04-20 NOTE — ED Provider Notes (Signed)
[facility]    CSN: [id number] Arrival date & time: 04/20/23  0856      History   Chief Complaint Chief Complaint  Patient presents with   Ankle Pain    HPI Bridget Stevens is a 52 y.o. female.   Patient presents to clinic for left ankle pain that started on July 1.  She had a telehealth visit on this date and it was recommended for her to get ankle x-rays for further evaluation, they suspected a sprain versus potential fracture.  She denies any falls or trauma, no known injuries.  Reports it is quite painful to bear weight, she has been elevating her ankle to help with the swelling.  No previous injuries to this ankle.  Area is swollen.  She is due to have a knee replacement of her left knee soon, is unsure if this is related to her ankle pain.  She takes Tylenol, gabapentin and muscle relaxer on a daily basis.  The history is provided by the patient and medical records.  Ankle Pain   Past Medical History:  Diagnosis Date   Anxiety    Arthritis    Asthma    GERD (gastroesophageal reflux disease)    Headache    Hepatomegaly    Hx of migraines    Hyperlipidemia    Ovarian cyst    Thyroid disease     Patient Active Problem List   Diagnosis Date Noted   Bacteremia 03/27/2022   GERD (gastroesophageal reflux disease) 03/27/2022   Acute pyelonephritis 03/27/2022   Asthma 03/27/2022   Vaginal atrophy 10/09/2020   Pelvic pain in female 09/22/2020   Ovarian cyst 08/28/2020   S/P cervical spinal fusion 02/18/2020   S/P lumbar fusion 09/05/2019   S/P endometrial ablation - 04/2012 05/29/2012    Past Surgical History:  Procedure Laterality Date   ANTERIOR CERVICAL DECOMP/DISCECTOMY FUSION N/A 02/18/2020   Procedure: Anterior Cervical Decompression/Discectomy Fusion - Cervical five-Cervcal six;  Surgeon: Tia Alert, MD;  Location: [facility] OR;  Service: Neurosurgery;  Laterality: N/A;   BACK SURGERY     BTL  07/30/2008   CARPAL TUNNEL RELEASE Right    CARPAL  TUNNEL RELEASE Left 09/22/2016   Procedure: CARPAL TUNNEL RELEASE, left;  Surgeon: Dairl Ponder, MD;  Location: [facility];  Service: Orthopedics;  Laterality: Left;   CESAREAN SECTION     CYST EXCISION Right 12/15/2022   DORSAL COMPARTMENT RELEASE Left 09/22/2016   Procedure: RELEASE DORSAL COMPARTMENT (DEQUERVAIN);  Surgeon: Dairl Ponder, MD;  Location: [facility];  Service: Orthopedics;  Laterality: Left;   ELBOW SURGERY Right    gastric by pass     HYSTEROSCOPY W/ ENDOMETRIAL ABLATION     KNEE SURGERY Left    meniscus tear and a spur   TUBAL LIGATION     WISDOM TOOTH EXTRACTION      OB History     Gravida  1   Para  1   Term      Preterm      AB      Living  1      SAB      IAB      Ectopic      Multiple      Live Births               Home Medications    Prior to Admission medications   Medication Sig Start Date End Date Taking? Authorizing Provider  acetaminophen (TYLENOL)  500 MG tablet Take 1,000 mg by mouth every 6 (six) hours as needed for moderate pain.     [provider]  albuterol (PROVENTIL HFA;VENTOLIN HFA) 108 (90 Base) MCG/ACT inhaler Inhale 2 puffs into the lungs every 4 (four) hours as needed for wheezing or shortness of breath.     [provider]  CALCIUM CITRATE PO Take 600 mg by mouth daily.    [provider]  Cholecalciferol (VITAMIN D) 50 MCG (2000 UT) CAPS Take 2,000 Units by mouth every evening.     [provider]  EPINEPHrine 0.3 mg/0.3 mL IJ SOAJ injection Inject 0.3 mg into the muscle as needed for anaphylaxis. 11/21/19   [provider]  famotidine (PEPCID) 40 MG tablet Take 40 mg by mouth at bedtime. 03/14/22   [provider]  FLAREX 0.1 % ophthalmic suspension Apply 1 drop to eye 2 (two) times daily as needed (itching eyes). 02/19/22   [provider]  gabapentin (NEURONTIN) 300 MG capsule Take 300 mg by mouth in the morning, at  noon, and at bedtime.    [provider]  Lifitegrast Benay Spice) 5 % SOLN Apply to eye.    [provider]  methocarbamol (ROBAXIN) 500 MG tablet Take 500 mg by mouth in the morning and at bedtime.    [provider]  naratriptan (AMERGE) 2.5 MG tablet Take 1 tablet (2.5 mg total) by mouth as needed for migraine. Take one (1) tablet at onset of headache; if returns or does not resolve, may repeat after 4 hours; do not exceed five (5) mg in 24 hours. 08/23/22   Ihor Austin, NP  nortriptyline (PAMELOR) 25 MG capsule Take 1 capsule (25 mg total) by mouth at bedtime. 04/02/23   Dohmeier, Porfirio Mylar, MD  omeprazole (PRILOSEC) 40 MG capsule Take 40 mg by mouth daily. 02/19/22   [provider]  Pediatric Multiple Vit-C-FA (MULTIVITAMIN ANIMAL SHAPES, WITH CA/FA,) with C & FA chewable tablet Chew 1 tablet by mouth 2 (two) times daily.    [provider]    Family History Family History  Problem Relation Age of Onset   Cancer Mother    Hypertension Mother    Thyroid disease Mother    Diabetes Father    Hypertension Father    Hyperlipidemia Father    Thyroid disease Father    Asthma Father    Cancer Sister        CERVICAL   Thyroid disease Sister    Asthma Sister    Asthma Brother    Breast cancer Maternal Grandmother        in her 45s    Social History Social History   Tobacco Use   Smoking status: Never   Smokeless tobacco: Never  Vaping Use   Vaping Use: Never used  Substance Use Topics   Alcohol use: Not Currently   Drug use: No     Allergies   Shellfish allergy, Peanut-containing drug products, Sulfa antibiotics, Ibuprofen, Oxycodone-acetaminophen, Sulfur, Triamcinolone, and Percocet [oxycodone-acetaminophen]   Review of Systems Review of Systems  Musculoskeletal:  Positive for gait problem and joint swelling.     Physical Exam Triage Vital Signs ED Triage Vitals  Enc Vitals Group     BP 04/20/23 0923 104/69     Pulse Rate  04/20/23 0923 81     Resp 04/20/23 0923 18     Temp 04/20/23 0923 97.9 F (36.6 C)     Temp Source 04/20/23 0923 Oral  SpO2 04/20/23 0923 97 %     Weight --      Height --      Head Circumference --      Peak Flow --      Pain Score 04/20/23 0921 9     Pain Loc --      Pain Edu? --      Excl. in GC? --    No data found.  Updated Vital Signs BP 104/69 (BP Location: Left Arm) Comment (BP Location): large cuff  Pulse 81   Temp 97.9 F (36.6 C) (Oral)   Resp 18   SpO2 97%   Visual Acuity Right Eye Distance:   Left Eye Distance:   Bilateral Distance:    Right Eye Near:   Left Eye Near:    Bilateral Near:     Physical Exam Vitals and nursing note reviewed.  Constitutional:      Appearance: Normal appearance.  HENT:     Head: Normocephalic and atraumatic.     Right Ear: External ear normal.     Left Ear: External ear normal.     Nose: Nose normal.     Mouth/Throat:     Mouth: Mucous membranes are moist.  Eyes:     General: No scleral icterus. Cardiovascular:     Rate and Rhythm: Normal rate.     Pulses: Normal pulses.          Dorsalis pedis pulses are 2+ on the right side.       Posterior tibial pulses are 2+ on the right side.  Pulmonary:     Effort: Pulmonary effort is normal. No respiratory distress.  Musculoskeletal:        General: Swelling and tenderness present. No deformity or signs of injury. Normal range of motion.     Right lower leg: No edema.     Left lower leg: Edema present.       Feet:  Feet:     Right foot:     Skin integrity: Skin integrity normal.     Comments: Left lateral ankle with tenderness to palpation.  Diffuse swelling of her lower extremity when compared to her right.  No obvious deformity, crepitus or ecchymosis. Skin:    General: Skin is warm and dry.     Capillary Refill: Capillary refill takes less than 2 seconds.     Findings: No rash.  Neurological:     General: No focal deficit present.     Mental Status: She is  alert and oriented to person, place, and time. Mental status is at baseline.  Psychiatric:        Mood and Affect: Mood normal.        Behavior: Behavior is cooperative.      UC Treatments / Results  Labs (all labs ordered are listed, but only abnormal results are displayed) Labs Reviewed - No data to display  EKG   Radiology DG Ankle Complete Left  Result Date: 04/20/2023 CLINICAL DATA:  Left ankle pain and swelling, no known injury. EXAM: LEFT ANKLE COMPLETE - 3+ VIEW COMPARISON:  None Available. FINDINGS: There is no evidence of fracture, dislocation, or joint effusion. There is no evidence of arthropathy or other focal bone abnormality. Soft tissues are unremarkable. IMPRESSION: Negative. Electronically Signed   By: Emmaline Kluver M.D.   On: 04/20/2023 10:02    Procedures Procedures (including critical care time)  Medications Ordered in UC Medications - No data to display  Initial Impression / Assessment  and Plan / UC Course  I have reviewed the triage vital signs and the nursing notes.  Pertinent labs & imaging results that were available during my care of the patient were reviewed by me and considered in my medical decision making (see chart for details).  Vitals and triage reviewed, patient is hemodynamically stable.  Left ankle pain and swelling since July 1, no known injury.  Lateral ankle is TTP.  Range of motion intact with pain.  Patient reports she is unable to bear weight.  Imaging negative for acute fracture or dislocation.  Discussed that this most likely is an ankle sprain, RICE method and orthopedic follow-up recommended.  Patient provided with Ace wrap in clinic, declined ankle brace as she thought this might be 'too compressive.'  Plan of care, follow-up care and return precautions given, no questions at this time.     Final Clinical Impressions(s) / UC Diagnoses   Final diagnoses:  Sprain of anterior talofibular ligament of left ankle, initial encounter      Discharge Instructions      Your x-rays were negative for acute fracture or dislocation.  I suspect you have an ankle sprain.  Please rest, ice, compress and elevate your ankle.  You can take 800 mg of ibuprofen every 8 hours as needed for pain and inflammation.  If your pain persist beyond the next week, or you develop any new or concerning symptoms, please follow-up with your orthopedic.  Return to clinic for any new or urgent symptoms.      ED Prescriptions   None    PDMP not reviewed this encounter.   Keil Pickering, [place], [place] 04/20/23 1022

## 2023-04-20 NOTE — ED Triage Notes (Signed)
Patient is here today for left ankle pain.  Patient reports 3 days of ankle pain.  Swelling to lateral ankle.  Patient limps with walking. No known injury  Patient has a scheduled surgery for left knee coming August first.  Not sure if this is contributing to pain in left ankle.    Patient has soaked ankle, propped up ankle.  Patient takes tylenol, gabapentin and muscle relaxer.

## 2023-04-20 NOTE — Discharge Instructions (Addendum)
Your x-rays were negative for acute fracture or dislocation.  I suspect you have an ankle sprain.  Please rest, ice, compress and elevate your ankle.  You can take 800 mg of ibuprofen every 8 hours as needed for pain and inflammation.  If your pain persist beyond the next week, or you develop any new or concerning symptoms, please follow-up with your orthopedic.  Return to clinic for any new or urgent symptoms.

## 2023-04-27 ENCOUNTER — Telehealth: Payer: Self-pay | Admitting: Adult Health

## 2023-04-27 NOTE — Telephone Encounter (Signed)
Pt asking if her appointment can be any earlier due to her therapist informing her that she believes pt may have tremors, pt has slurred speech, and jerking.  Pt was told RN's will review request and respond

## 2023-04-27 NOTE — Telephone Encounter (Signed)
Called the patient back.  Patient is requesting to be seen sooner due to she is continue to have ongoing concerns with jerking/tremor motions, her speech is slurred and she also gets lightheaded. As we were talking, I asked her how long this has been going on and she has been having these concerns for 1-2 months. She saw a heart and vascular MD who has completed testing and they are recommending her follow up with neurology. I didn't realize that a referral was already placed but in June a referral was placed for these concerns by her pcp. Pt is not scheduled until sept and she states the speech slurring and tremors are worsening. She has not had any imaging completed. Pt is scheduled next month with Shanda Bumps but advised this is her maintenance follow up for her migraines and that because its a "new problem" she has to be evaluated by MD. Informed her that if she develops any other stroke like symptoms and educated on those, she should be evaluated through ER. Informed her I would let the referrals team know in case she can be worked in sooner with another provider in Dr Quentin Mulling absence.

## 2023-05-02 NOTE — Progress Notes (Signed)
 Sent message, via epic in basket, requesting orders in epic from surgeon.  

## 2023-05-03 ENCOUNTER — Ambulatory Visit: Payer: Self-pay | Admitting: Student

## 2023-05-04 ENCOUNTER — Ambulatory Visit: Payer: Medicaid Other | Admitting: Psychiatry

## 2023-05-04 ENCOUNTER — Encounter: Payer: Self-pay | Admitting: Psychiatry

## 2023-05-04 VITALS — BP 126/67 | HR 79 | Ht 60.0 in | Wt 147.6 lb

## 2023-05-04 DIAGNOSIS — R531 Weakness: Secondary | ICD-10-CM | POA: Diagnosis not present

## 2023-05-04 DIAGNOSIS — R251 Tremor, unspecified: Secondary | ICD-10-CM

## 2023-05-04 DIAGNOSIS — R4781 Slurred speech: Secondary | ICD-10-CM | POA: Diagnosis not present

## 2023-05-04 NOTE — Patient Instructions (Signed)
Blood work to check thyroid and B12 levels  MRI brain and cervical spine

## 2023-05-04 NOTE — Progress Notes (Signed)
[facility]  PATIENT: Bridget Stevens DOB: 09-10-1971  REFERRING CLINICIAN: Levonne Lapping, NP HISTORY FROM: self daughter REASON FOR VISIT: shakiness, slurred speech   HISTORICAL  CHIEF COMPLAINT:  Chief Complaint  Patient presents with   Consult    Pt in room 1, daughter in room. Here for consult for dizziness and jerks for over 2 months. Has seen cardiology not cardio related. Pt has a hard time holding things. Reports low blood pressure. Pt has noticed slurred speech at times, tingling in face and fingers at times. Sharp pain in left toes.     HISTORY OF PRESENT ILLNESS:  She presents today for shakiness and slurred speech which began ~3 months ago and has been progressively worsening over time. She will have episodes of shaking in both arms and legs which lasts for 5 minutes at a time. Usually occurs 1-2 times per day with no clear trigger. Does not seem to be associated with any particular activity or position. It is not associated with headaches. She has not lost consciousness. Will get lightheaded when she stands up quickly but otherwise denies dizziness. She has also noticed intermittent slurred speech and feels the left side of her face has been drooping. Occasionally feels tingling on bilateral cheeks and in her fingers. Denies any recent medication changes.  She has started to notice her legs feel like they are going to give out from under her and is getting a knee replacement soon. She is worried that she is going to fall. Denies pain or numbness.  OTHER MEDICAL CONDITIONS: orthostatic hypotension, cervical spondylosis s/p C5-6 fusion, asthma, anxiety, migraines, thyroid disease   REVIEW OF SYSTEMS: Full 14 system review of systems performed and negative with exception of: shakiness, slurred speech, tingling  ALLERGIES: Allergies  Allergen Reactions   Shellfish Allergy Anaphylaxis   Peanut-Containing Drug Products Swelling    SWELLING REACTION  UNSPECIFIED    Sulfa Antibiotics Hives   Ibuprofen Other (See Comments)    Due to gastric surgery   Oxycodone-Acetaminophen Other (See Comments)    Causes Headaches, Dizziness    Triamcinolone Other (See Comments)    Pt is unsure of reaction    Percocet [Oxycodone-Acetaminophen] Other (See Comments)    PT STATES THAT SHE HAS HEADACHES WITH THIS MED    HOME MEDICATIONS: Outpatient Medications Prior to Visit  Medication Sig Dispense Refill   acetaminophen (TYLENOL) 500 MG tablet Take 1,000 mg by mouth every 6 (six) hours as needed for moderate pain.      albuterol (PROVENTIL HFA;VENTOLIN HFA) 108 (90 Base) MCG/ACT inhaler Inhale 2 puffs into the lungs every 4 (four) hours as needed for wheezing or shortness of breath.      CALCIUM CITRATE PO Take 600 mg by mouth daily.     cetirizine (ZYRTEC) 10 MG tablet Take 10 mg by mouth daily.     Cholecalciferol (VITAMIN D) 50 MCG (2000 UT) CAPS Take 2,000 Units by mouth daily.     EPINEPHrine 0.3 mg/0.3 mL IJ SOAJ injection Inject 0.3 mg into the muscle as needed for anaphylaxis.     famotidine (PEPCID) 40 MG tablet Take 40 mg by mouth at bedtime.     FLAREX 0.1 % ophthalmic suspension Apply 1 drop to eye 2 (two) times daily as needed (itching eyes).     gabapentin (NEURONTIN) 300 MG capsule Take 300 mg by mouth 2 (two) times daily.     Lifitegrast (XIIDRA) 5 % SOLN Place 1 drop into both eyes 2 (  two) times daily as needed (dry eyes).     methocarbamol (ROBAXIN) 500 MG tablet Take 500 mg by mouth at bedtime.     naratriptan (AMERGE) 2.5 MG tablet Take 1 tablet (2.5 mg total) by mouth as needed for migraine. Take one (1) tablet at onset of headache; if returns or does not resolve, may repeat after 4 hours; do not exceed five (5) mg in 24 hours. 10 tablet 11   nortriptyline (PAMELOR) 25 MG capsule Take 1 capsule (25 mg total) by mouth at bedtime. 30 capsule 5   omeprazole (PRILOSEC) 40 MG capsule Take 40 mg by mouth daily.     Pediatric Multiple  Vit-C-FA (MULTIVITAMIN ANIMAL SHAPES, WITH CA/FA,) with C & FA chewable tablet Chew 1 tablet by mouth daily.     No facility-administered medications prior to visit.    PAST MEDICAL HISTORY: Past Medical History:  Diagnosis Date   Anxiety    Arthritis    Asthma    GERD (gastroesophageal reflux disease)    Headache    Hepatomegaly    Hx of migraines    Hyperlipidemia    Ovarian cyst    Thyroid disease     PAST SURGICAL HISTORY: Past Surgical History:  Procedure Laterality Date   ANTERIOR CERVICAL DECOMP/DISCECTOMY FUSION N/A 02/18/2020   Procedure: Anterior Cervical Decompression/Discectomy Fusion - Cervical five-Cervcal six;  Surgeon: Tia Alert, MD;  Location: [facility] OR;  Service: Neurosurgery;  Laterality: N/A;   BACK SURGERY     BTL  07/30/2008   CARPAL TUNNEL RELEASE Right    CARPAL TUNNEL RELEASE Left 09/22/2016   Procedure: CARPAL TUNNEL RELEASE, left;  Surgeon: Dairl Ponder, MD;  Location: [facility];  Service: Orthopedics;  Laterality: Left;   CESAREAN SECTION     CYST EXCISION Right 12/15/2022   DORSAL COMPARTMENT RELEASE Left 09/22/2016   Procedure: RELEASE DORSAL COMPARTMENT (DEQUERVAIN);  Surgeon: Dairl Ponder, MD;  Location: [facility];  Service: Orthopedics;  Laterality: Left;   ELBOW SURGERY Right    gastric by pass     HYSTEROSCOPY W/ ENDOMETRIAL ABLATION     KNEE SURGERY Left    meniscus tear and a spur   TUBAL LIGATION     WISDOM TOOTH EXTRACTION      FAMILY HISTORY: Family History  Problem Relation Age of Onset   Cancer Mother    Hypertension Mother    Thyroid disease Mother    Diabetes Father    Hypertension Father    Hyperlipidemia Father    Thyroid disease Father    Asthma Father    Cancer Sister        CERVICAL   Thyroid disease Sister    Asthma Sister    Asthma Brother    Breast cancer Maternal Grandmother        in her 38s    SOCIAL HISTORY: Social History   Socioeconomic History    Marital status: Divorced    Spouse name: Not on file   Number of children: 1   Years of education: Not on file   Highest education level: Not on file  Occupational History   Not on file  Tobacco Use   Smoking status: Never   Smokeless tobacco: Never  Vaping Use   Vaping status: Never Used  Substance and Sexual Activity   Alcohol use: Not Currently   Drug use: No   Sexual activity: Yes    Partners: Male    Birth control/protection: Surgical  Comment: BTL  Other Topics Concern   Not on file  Social History Narrative   Right handed   Wear glasses    Drinks coffee one cup daily, drinks 1 soda per day, drinks hot tea   Social Determinants of Health   Financial Resource Strain: Medium Risk (11/19/2022)   Received from [facility], [facility]   Overall Financial Resource Strain (CARDIA)    Difficulty of Paying Living Expenses: Somewhat hard  Food Insecurity: No Food Insecurity (11/19/2022)   Received from [facility], [facility]   Hunger Vital Sign    Worried About Running Out of Food in the Last Year: Never true    Ran Out of Food in the Last Year: Never true  Transportation Needs: No Transportation Needs (11/19/2022)   Received from [facility], [facility]   PRAPARE - Transportation    Lack of Transportation (Medical): No    Lack of Transportation (Non-Medical): No  Physical Activity: Unknown (11/19/2022)   Received from [facility], [facility]   Exercise Vital Sign    Days of Exercise per Week: 0 days    Minutes of Exercise per Session: Not on file  Stress: No Stress Concern Present (11/19/2022)   Received from [facility], [facility] - Occupational Stress Questionnaire    Feeling of Stress : Not at all  Social Connections: Socially Integrated (11/19/2022)   [facility] from [facility]   Social Network    How would you rate your social network (family, work, friends)?: Good participation with  social networks  Intimate Partner Violence: Not At Risk (11/19/2022)   Received from [facility], [facility]   HITS    Over the last 12 months how often did your partner physically hurt you?: 1    Over the last 12 months how often did your partner insult you or talk down to you?: 1    Over the last 12 months how often did your partner threaten you with physical harm?: 1    Over the last 12 months how often did your partner scream or curse at you?: 1     PHYSICAL EXAM  GENERAL EXAM/CONSTITUTIONAL: Vitals:  Vitals:   05/04/23 0845  BP: 126/67  Pulse: 79  Weight: 147 lb 9.6 oz (67 kg)  Height: 5' (1.524 m)   Body mass index is 28.83 kg/m. Wt Readings from Last 3 Encounters:  05/04/23 147 lb 9.6 oz (67 kg)  04/04/23 143 lb 4.8 oz (65 kg)  03/07/23 143 lb 12.8 oz (65.2 kg)   Patient is in no distress; well developed, nourished and groomed; neck is supple   NEUROLOGIC: MENTAL STATUS:  awake, alert, oriented to person, place and time recent and remote memory intact normal attention and concentration language fluent, comprehension intact, naming intact fund of knowledge appropriate  CRANIAL NERVE:  2nd, 3rd, 4th, 6th - pupils equal and reactive to light, visual fields full to confrontation, extraocular muscles intact, no nystagmus 5th - facial sensation symmetric 7th - mild drooping of left mouth at rest, symmetric smile 8th - hearing intact 9th - palate elevates symmetrically, uvula midline 11th - shoulder shrug symmetric 12th - tongue protrusion midline  MOTOR:  normal bulk and tone, full strength in the BUE, 4/5 LLE (limited due to knee pain). No observable tremor, no cogwheeling  SENSORY:  normal and symmetric to light touch all 4 extremities, hyperalgesia of left foot with pinprick  COORDINATION:  finger-nose-finger, fine finger  movements normal  REFLEXES:  deep tendon reflexes present and symmetric  GAIT/STATION:  Decreased stride  length     DIAGNOSTIC DATA (LABS, IMAGING, TESTING) - I reviewed patient records, labs, notes, testing and imaging myself where available.  Lab Results  Component Value Date   WBC 10.4 03/30/2022   HGB 12.1 03/30/2022   HCT 36.8 03/30/2022   MCV 98.1 03/30/2022   PLT 523 (H) 03/30/2022      Component Value Date/Time   NA 141 03/30/2022 0457   K 4.2 03/30/2022 0457   CL 100 03/30/2022 0457   CO2 31 03/30/2022 0457   GLUCOSE 132 (H) 03/30/2022 0457   BUN 14 03/30/2022 0457   CREATININE 0.74 03/30/2022 0457   CREATININE 0.60 06/12/2021 0000   CALCIUM 9.0 03/30/2022 0457   PROT 7.4 03/30/2022 0457   ALBUMIN 3.0 (L) 03/30/2022 0457   AST 61 (H) 03/30/2022 0457   ALT 66 (H) 03/30/2022 0457   ALKPHOS 196 (H) 03/30/2022 0457   BILITOT 0.4 03/30/2022 0457   GFRNONAA >60 03/30/2022 0457   GFRAA >60 07/13/2020 2043   Lab Results  Component Value Date   CHOL 152 06/12/2021   HDL 56 06/12/2021   LDLCALC 79 06/12/2021   TRIG 85 06/12/2021   CHOLHDL 2.7 06/12/2021   No results found for: "HGBA1C" No results found for: "VITAMINB12" Lab Results  Component Value Date   TSH 1.60 06/12/2021    MRI L-spine 07/18/22: 1. Interval postsurgical changes from decompression and fusion at L4-5 and L5-S1 with interval resolution of anterolisthesis at L5-S1. 2. No spinal canal or neural foraminal stenosis at any level.  MRI brain 06/16/22: 1. No acute intracranial pathology. 2. Scattered small foci of FLAIR signal abnormality in the supratentorial white matter are nonspecific but can be seen in the setting of mild chronic microvascular ischemic change or migraine headaches.  MRI C-spine 02/13/22: Good appearance at the previous ACDF level C5-6.   No change in mild degenerative spondylosis at C4-5 and C6-7 compared to the study of April 2021. Mild right foraminal narrowing at C6-7 but without likely neural compression.   Right-sided facet osteoarthritis at C3-4 and bilateral  facet osteoarthritis at C7-T1, but without joint effusions or bone edema at those locations.   ASSESSMENT AND PLAN  52 y.o. year old female with a history of orthostatic hypotension, cervical spondylosis s/p C5-6 fusion, asthma, anxiety, migraines, thyroid disease who presents for evaluation of episodes of shaking and slurred speech. Discussed how polypharmacy may contribute to shakiness, however she denies any recent medication changes to explain her new symptoms. She does appear to have a mild left facial droop at rest. No appreciable tremor or signs of parkinsonism on exam today. Suspect her gait symptoms are secondary to her baseline knee and L-spine issues. Will order MRI brain and repeat MRI C-spine. Will also check TSH and B12 levels for causes of generalized weakness. Low suspicion for seizures given generalized shaking with retained consciousness, but could consider EEG if workup is unrevealing.   1. Weakness   2. Slurred speech       PLAN: -CMP, B12, TSH -MRI Brain, C-spine -Consider vascular imaging, EEG if workup unrevealing  Orders Placed This Encounter  Procedures   MR BRAIN W WO CONTRAST   MR CERVICAL SPINE WO CONTRAST   TSH   Vitamin B12   CMP    Return in about 4 months (around 09/04/2023).    Ocie Doyne, MD 05/04/23 9:49 AM  I spent an average  of 60 minutes chart reviewing and counseling the patient, with at least 50% of the time face to face with the patient.   [facility] [address], [address] [place], [place] [address] [phone]

## 2023-05-05 LAB — COMPREHENSIVE METABOLIC PANEL
ALT: 24 IU/L (ref 0–32)
AST: 27 IU/L (ref 0–40)
Albumin: 4.6 g/dL (ref 3.8–4.9)
Alkaline Phosphatase: 113 IU/L (ref 44–121)
BUN/Creatinine Ratio: 12 (ref 9–23)
BUN: 10 mg/dL (ref 6–24)
Bilirubin Total: 0.4 mg/dL (ref 0.0–1.2)
CO2: 29 mmol/L (ref 20–29)
Calcium: 9.9 mg/dL (ref 8.7–10.2)
Chloride: 99 mmol/L (ref 96–106)
Creatinine, Ser: 0.84 mg/dL (ref 0.57–1.00)
Globulin, Total: 2.5 g/dL (ref 1.5–4.5)
Glucose: 80 mg/dL (ref 70–99)
Potassium: 4.4 mmol/L (ref 3.5–5.2)
Sodium: 144 mmol/L (ref 134–144)
Total Protein: 7.1 g/dL (ref 6.0–8.5)
eGFR: 83 mL/min/{1.73_m2} (ref >59)

## 2023-05-05 LAB — TSH: TSH: 4.96 u[IU]/mL — ABNORMAL HIGH (ref 0.450–4.500)

## 2023-05-05 LAB — VITAMIN B12: Vitamin B-12: 799 pg/mL (ref 232–1245)

## 2023-05-06 NOTE — Patient Instructions (Signed)
SURGICAL WAITING ROOM VISITATION Patients having surgery or a procedure may have no more than 2 support people in the waiting area - these visitors may rotate in the visitor waiting room.   Due to an increase in RSV and influenza rates and associated hospitalizations, children ages 71 and under may not visit patients in [facility] hospitals. If the patient needs to stay at the hospital during part of their recovery, the visitor guidelines for inpatient rooms apply.  PRE-OP VISITATION  Pre-op nurse will coordinate an appropriate time for 1 support person to accompany the patient in pre-op.  This support person may not rotate.  This visitor will be contacted when the time is appropriate for the visitor to come back in the pre-op area.  Please refer to the [facility] website for the visitor guidelines for Inpatients (after your surgery is over and you are in a regular room).  You are not required to quarantine at this time prior to your surgery. However, you must do this: Hand Hygiene often Do NOT share personal items Notify your provider if you are in close contact with someone who has COVID or you develop fever 100.4 or greater, new onset of sneezing, cough, sore throat, shortness of breath or body aches.  If you test positive for Covid or have been in contact with anyone that has tested positive in the last 10 days please notify you surgeon.    Your procedure is scheduled on:  Thursday  May 19, 2023  Report to [facility] Main Entrance: [address] entrance where the [employer] is available.   Report to admitting at:  11:00   AM  Call this number if you have any questions or problems the morning of surgery [phone]  Do not eat food or drink anything after Midnight the night prior to your surgery/procedure.  FOLLOW  ANY ADDITIONAL PRE OP INSTRUCTIONS YOU RECEIVED FROM YOUR SURGEON'S OFFICE!!!   Oral Hygiene is also important to reduce your risk of infection.         Remember - BRUSH YOUR TEETH THE MORNING OF SURGERY WITH YOUR REGULAR TOOTHPASTE  Do NOT smoke after Midnight the night before surgery.  Take ONLY these medicines the morning of surgery with A SIP OF WATER: omeprazole (Prilosec), cetirizine (Zyrtec), gabapentin. You may use your Eye drops and Albuterol inhaler if needed.  You may take Tylenol if needed for pain.                     You may not have any metal on your body including hair pins, jewelry, and body piercing  Do not wear make-up, lotions, powders, perfumes or deodorant  Do not wear nail polish including gel and S&S, artificial / acrylic nails, or any other type of covering on natural nails including finger and toenails. If you have artificial nails, gel coating, etc., that needs to be removed by a nail salon, Please have this removed prior to surgery. Not doing so may mean that your surgery could be cancelled or delayed if the Surgeon or anesthesia staff feels like they are unable to monitor you safely.   Do not shave 48 hours prior to surgery to avoid nicks in your skin which may contribute to postoperative infections.    Contacts, Hearing Aids, dentures or bridgework may not be worn into surgery. DENTURES WILL BE REMOVED PRIOR TO SURGERY PLEASE DO NOT APPLY "Poly grip" OR ADHESIVES!!!  You may bring a small overnight bag with you  on the day of surgery, only pack items that are not valuable. [facility] IS NOT RESPONSIBLE   FOR VALUABLES THAT ARE LOST OR STOLEN.   Do not bring your home medications to the hospital EXCEPT BRING YOUR ALBUTEROL INHALER. The Pharmacy will dispense medications listed on your medication list to you during your admission in the Hospital.   Please read over the following fact sheets you were given: IF YOU HAVE QUESTIONS ABOUT YOUR PRE-OP INSTRUCTIONS, PLEASE CALL [phone].          Pre-operative 5 CHG Bath Instructions   You can play a key role in reducing the risk of infection after  surgery. Your skin needs to be as free of germs as possible. You can reduce the number of germs on your skin by washing with CHG (chlorhexidine gluconate) soap before surgery. CHG is an antiseptic soap that kills germs and continues to kill germs even after washing.   DO NOT use if you have an allergy to chlorhexidine/CHG or antibacterial soaps. If your skin becomes reddened or irritated, stop using the CHG and notify one of our RNs at [phone]  Please shower with the CHG soap starting 4 days before surgery using the following schedule: START SHOWERS ON SUNDAY  May 15, 2023                                                                                                                                                                                      Please keep in mind the following:  DO NOT shave, including legs and underarms, starting the day of your first shower.   You may shave your face at any point before/day of surgery.   Place clean sheets on your bed the day you start using CHG soap. Use a clean washcloth (not used since being washed) for each shower. DO NOT sleep with pets once you start using the CHG.   CHG Shower Instructions:  If you choose to wash your hair and private area, wash first with your normal shampoo/soap.  After you use shampoo/soap, rinse your hair and body thoroughly to remove shampoo/soap residue.  Turn the water OFF and apply about 3 tablespoons (45 ml) of CHG soap to a CLEAN washcloth.  Apply CHG soap ONLY FROM YOUR NECK DOWN TO YOUR TOES (washing for 3-5 minutes)  DO NOT use CHG soap on face, private areas, open wounds, or sores.  Pay special attention to the area where your surgery is being performed.  If you are having back surgery, having someone wash your back for you may be helpful.  Wait 2 minutes after CHG soap is applied, then you may rinse  off the CHG soap.  Pat dry with a clean towel  Put on clean clothes/pajamas   If you choose to wear  lotion, please use ONLY the CHG-compatible lotions on the back of this paper.     Additional instructions for the day of surgery: DO NOT APPLY any lotions, deodorants, cologne, or perfumes.   Put on clean/comfortable clothes.  Brush your teeth.  Ask your nurse before applying any prescription medications to the skin.      CHG Compatible Lotions   Aveeno Moisturizing lotion  Cetaphil Moisturizing Cream  Cetaphil Moisturizing Lotion  Clairol Herbal Essence Moisturizing Lotion, Dry Skin  Clairol Herbal Essence Moisturizing Lotion, Extra Dry Skin  Clairol Herbal Essence Moisturizing Lotion, Normal Skin  Curel Age Defying Therapeutic Moisturizing Lotion with Alpha Hydroxy  Curel Extreme Care Body Lotion  Curel Soothing Hands Moisturizing Hand Lotion  Curel Therapeutic Moisturizing Cream, Fragrance-Free  Curel Therapeutic Moisturizing Lotion, Fragrance-Free  Curel Therapeutic Moisturizing Lotion, Original Formula  Eucerin Daily Replenishing Lotion  Eucerin Dry Skin Therapy Plus Alpha Hydroxy Crme  Eucerin Dry Skin Therapy Plus Alpha Hydroxy Lotion  Eucerin Original Crme  Eucerin Original Lotion  Eucerin Plus Crme Eucerin Plus Lotion  Eucerin TriLipid Replenishing Lotion  Keri Anti-Bacterial Hand Lotion  Keri Deep Conditioning Original Lotion Dry Skin Formula Softly Scented  Keri Deep Conditioning Original Lotion, Fragrance Free Sensitive Skin Formula  Keri Lotion Fast Absorbing Fragrance Free Sensitive Skin Formula  Keri Lotion Fast Absorbing Softly Scented Dry Skin Formula  Keri Original Lotion  Keri Skin Renewal Lotion Keri Silky Smooth Lotion  Keri Silky Smooth Sensitive Skin Lotion  Nivea Body Creamy Conditioning Oil  Nivea Body Extra Enriched Lotion  Nivea Body Original Lotion  Nivea Body Sheer Moisturizing Lotion Nivea Crme  Nivea Skin Firming Lotion  NutraDerm 30 Skin Lotion  NutraDerm Skin Lotion  NutraDerm Therapeutic Skin Cream  NutraDerm Therapeutic Skin  Lotion  ProShield Protective Hand Cream  Provon moisturizing lotion   FAILURE TO FOLLOW THESE INSTRUCTIONS MAY RESULT IN THE CANCELLATION OF YOUR SURGERY  PATIENT SIGNATURE_________________________________  NURSE SIGNATURE__________________________________  ________________________________________________________________________

## 2023-05-06 NOTE — Progress Notes (Signed)
COVID Vaccine received:  []  No [x]  Yes Date of any COVID positive Test in last 90 days:  PCP - Levonne Lapping, NP    Cardiologist - Tessa Lerner, DO  at [facility]  Cleared 03-07-2023 note Neurology-  Ocie Doyne, MD  Chest x-ray - 03-23-2022  2v  Epic EKG -  03-07-2023  Epic Stress Test - Eugenie Birks  03-10-2023  Epic ECHO - 02-24-2023 Cardiac Cath -  Zio Monitor- 04-06-2023  PCR screen: [x]  Ordered & Completed           []   No Order but Needs PROFEND           []   N/A for this surgery  Surgery Plan:  []  Ambulatory                            [x]  Outpatient in bed                            []  Admit  Anesthesia:    []  General  [x]  Spinal                           []   Choice []   MAC  Pacemaker / ICD device [x]  No []  Yes   Spinal Cord Stimulator:[x]  No []  Yes       History of Sleep Apnea? [x]  No []  Yes   CPAP used?- [x]  No []  Yes    Does the patient monitor blood sugar?          []  No []  Yes  [x]  N/A  Patient has: [x]  NO Hx DM   []  Pre-DM                 []  DM1  []   DM2 Does patient have a [employer] or Dexacom? []  No []  Yes   Fasting Blood Sugar Ranges-  Checks Blood Sugar _____ times a day  Blood Thinner / Instructions:  None Aspirin Instructions: None   ???  ERAS Protocol Ordered: []  No  []  Yes    NO ORDERS AS OF 05-06-23 PRE-SURGERY []  ENSURE  []  G2  []  No Drink Ordered  Patient is to be NPO after:   Comments: Patient was given the 5 CHG shower / bath instructions for THA / TKA / Total or Reverse Shoulder arthroplasty surgery along with 2 bottles of the CHG soap. Patient will start this on: Sunday  05-15-2023  All questions were asked and answered, Patient voiced understanding of this process.   Activity level: Patient is able / unable to climb a flight of stairs without difficulty; []  No CP  []  No SOB, but would have ___   Patient can / can not perform ADLs without assistance.   Anesthesia review: ACDF  C5-6, GERD, asthma, s/p gastric bypass,  anxiety,Hepatomegaly, migraines, orthostatic hypotension  Patient denies shortness of breath, fever, cough and chest pain at PAT appointment.  Patient verbalized understanding and agreement to the Pre-Surgical Instructions that were given to them at this PAT appointment. Patient was also educated of the need to review these PAT instructions again prior to her surgery.I reviewed the appropriate phone numbers to call if they have any and questions or concerns.

## 2023-05-09 ENCOUNTER — Encounter (HOSPITAL_COMMUNITY)
Admission: RE | Admit: 2023-05-09 | Discharge: 2023-05-09 | Disposition: A | Discharge status: Home / Self Care | Payer: Medicaid Other | Source: Ambulatory Visit | Attending: Orthopedic Surgery | Admitting: Orthopedic Surgery

## 2023-05-09 ENCOUNTER — Ambulatory Visit: Payer: Self-pay | Admitting: Student

## 2023-05-09 ENCOUNTER — Encounter (HOSPITAL_COMMUNITY): Payer: Self-pay

## 2023-05-09 ENCOUNTER — Other Ambulatory Visit: Payer: Self-pay

## 2023-05-09 VITALS — BP 106/71 | HR 76 | Temp 98.5°F | Resp 18 | Ht 60.0 in | Wt 146.0 lb

## 2023-05-09 DIAGNOSIS — R160 Hepatomegaly, not elsewhere classified: Secondary | ICD-10-CM | POA: Insufficient documentation

## 2023-05-09 DIAGNOSIS — Z01812 Encounter for preprocedural laboratory examination: Secondary | ICD-10-CM | POA: Insufficient documentation

## 2023-05-09 DIAGNOSIS — Z01818 Encounter for other preprocedural examination: Secondary | ICD-10-CM

## 2023-05-09 HISTORY — DX: Anemia, unspecified: D64.9

## 2023-05-09 LAB — CBC
HCT: 42.3 % (ref 36.0–46.0)
Hemoglobin: 13.8 g/dL (ref 12.0–15.0)
MCH: 31.4 pg (ref 26.0–34.0)
MCHC: 32.6 g/dL (ref 30.0–36.0)
MCV: 96.4 fL (ref 80.0–100.0)
Platelets: 230 10*3/uL (ref 150–400)
RBC: 4.39 MIL/uL (ref 3.87–5.11)
RDW: 12.9 % (ref 11.5–15.5)
WBC: 5.4 10*3/uL (ref 4.0–10.5)
nRBC: 0 % (ref 0.0–0.2)

## 2023-05-09 LAB — SURGICAL PCR SCREEN
MRSA, PCR: NEGATIVE
Staphylococcus aureus: NEGATIVE

## 2023-05-16 NOTE — Progress Notes (Signed)
Called patient to inform her about her monitor. Patient understood

## 2023-05-18 NOTE — Anesthesia Preprocedure Evaluation (Addendum)
Anesthesia Evaluation  Patient identified by MRN, date of birth, ID band Patient awake    Reviewed: Allergy & Precautions, NPO status , Patient's Chart, lab work & pertinent test results  Airway Mallampati: II  TM Distance: >3 FB Neck ROM: Full    Dental  (+) Teeth Intact, Dental Advisory Given   Pulmonary asthma    Pulmonary exam normal breath sounds clear to auscultation       Cardiovascular negative cardio ROS Normal cardiovascular exam Rhythm:Regular Rate:Normal     Neuro/Psych  Headaches PSYCHIATRIC DISORDERS Anxiety     L4-S1 fusion ACDF    GI/Hepatic Neg liver ROS,GERD  Medicated and Controlled,,  Endo/Other  negative endocrine ROS    Renal/GU negative Renal ROS     Musculoskeletal  (+) Arthritis ,    Abdominal   Peds  Hematology negative hematology ROS (+)   Anesthesia Other Findings   Reproductive/Obstetrics                             Anesthesia Physical Anesthesia Plan  ASA: 2  Anesthesia Plan: Spinal   Post-op Pain Management: Regional block* and Tylenol PO (pre-op)*   Induction: Intravenous  PONV Risk Score and Plan: 2 and Midazolam, TIVA, Dexamethasone and Ondansetron  Airway Management Planned: Natural Airway and Simple Face Mask  Additional Equipment:   Intra-op Plan:   Post-operative Plan:   Informed Consent: I have reviewed the patients History and Physical, chart, labs and discussed the procedure including the risks, benefits and alternatives for the proposed anesthesia with the patient or authorized representative who has indicated his/her understanding and acceptance.     Dental advisory given  Plan Discussed with: CRNA  Anesthesia Plan Comments:        Anesthesia Quick Evaluation

## 2023-05-19 ENCOUNTER — Ambulatory Visit: Payer: Self-pay | Admitting: Student

## 2023-05-19 ENCOUNTER — Other Ambulatory Visit: Payer: Self-pay

## 2023-05-19 ENCOUNTER — Encounter (HOSPITAL_COMMUNITY): Payer: Self-pay | Admitting: Orthopedic Surgery

## 2023-05-19 ENCOUNTER — Ambulatory Visit (HOSPITAL_COMMUNITY): Payer: Medicaid Other | Admitting: Anesthesiology

## 2023-05-19 ENCOUNTER — Observation Stay (HOSPITAL_COMMUNITY): Payer: Medicaid Other

## 2023-05-19 ENCOUNTER — Ambulatory Visit (HOSPITAL_BASED_OUTPATIENT_CLINIC_OR_DEPARTMENT_OTHER): Payer: Medicaid Other | Admitting: Anesthesiology

## 2023-05-19 ENCOUNTER — Encounter (HOSPITAL_COMMUNITY)
Admission: RE | Disposition: A | Discharge status: Home / Self Care | Payer: Self-pay | Source: Ambulatory Visit | Attending: Orthopedic Surgery

## 2023-05-19 ENCOUNTER — Observation Stay (HOSPITAL_COMMUNITY)
Admission: RE | Admit: 2023-05-19 | Discharge: 2023-05-21 | Disposition: A | Discharge status: Home / Self Care | Payer: Medicaid Other | Source: Ambulatory Visit | Attending: Orthopedic Surgery | Admitting: Orthopedic Surgery

## 2023-05-19 DIAGNOSIS — M1712 Unilateral primary osteoarthritis, left knee: Secondary | ICD-10-CM

## 2023-05-19 DIAGNOSIS — Z79899 Other long term (current) drug therapy: Secondary | ICD-10-CM | POA: Diagnosis not present

## 2023-05-19 DIAGNOSIS — Z96652 Presence of left artificial knee joint: Principal | ICD-10-CM

## 2023-05-19 DIAGNOSIS — J45909 Unspecified asthma, uncomplicated: Secondary | ICD-10-CM | POA: Insufficient documentation

## 2023-05-19 HISTORY — PX: KNEE ARTHROPLASTY: SHX992

## 2023-05-19 SURGERY — ARTHROPLASTY, KNEE, TOTAL, USING IMAGELESS COMPUTER-ASSISTED NAVIGATION
Anesthesia: General | Site: Knee | Laterality: Left

## 2023-05-19 MED ORDER — FENTANYL CITRATE PF 50 MCG/ML IJ SOSY
PREFILLED_SYRINGE | INTRAMUSCULAR | Status: AC
  Filled 2023-05-19: qty 1

## 2023-05-19 MED ORDER — METHOCARBAMOL 500 MG IVPB - SIMPLE MED
INTRAVENOUS | Status: AC
  Filled 2023-05-19: qty 55

## 2023-05-19 MED ORDER — ONDANSETRON HCL 4 MG PO TABS: 4.0000 mg | ORAL_TABLET | Freq: Four times a day (QID) | ORAL | Status: DC | PRN

## 2023-05-19 MED ORDER — ONDANSETRON HCL 4 MG/2ML IJ SOLN
INTRAMUSCULAR | Status: DC | PRN
  Administered 2023-05-19: 4 mg via INTRAVENOUS

## 2023-05-19 MED ORDER — ALBUTEROL SULFATE (2.5 MG/3ML) 0.083% IN NEBU: 2.5000 mg | INHALATION_SOLUTION | RESPIRATORY_TRACT | Status: DC | PRN

## 2023-05-19 MED ORDER — SODIUM CHLORIDE 0.9 % IV SOLN: INTRAVENOUS | Status: DC

## 2023-05-19 MED ORDER — ASPIRIN 81 MG PO CHEW
81.0000 mg | CHEWABLE_TABLET | Freq: Two times a day (BID) | ORAL | Status: DC
  Administered 2023-05-19 – 2023-05-21 (×4): 81 mg via ORAL
  Filled 2023-05-19 (×4): qty 1

## 2023-05-19 MED ORDER — ACETAMINOPHEN 500 MG PO TABS
1000.0000 mg | ORAL_TABLET | Freq: Once | ORAL | Status: DC
  Filled 2023-05-19: qty 2

## 2023-05-19 MED ORDER — FENTANYL CITRATE PF 50 MCG/ML IJ SOSY
PREFILLED_SYRINGE | INTRAMUSCULAR | Status: AC
  Administered 2023-05-19: 50 ug
  Filled 2023-05-19: qty 2

## 2023-05-19 MED ORDER — STERILE WATER FOR IRRIGATION IR SOLN
Status: DC | PRN
  Administered 2023-05-19: 2000 mL

## 2023-05-19 MED ORDER — ACETAMINOPHEN 325 MG PO TABS
325.0000 mg | ORAL_TABLET | Freq: Four times a day (QID) | ORAL | Status: DC | PRN
  Administered 2023-05-21: 650 mg via ORAL
  Filled 2023-05-19: qty 2

## 2023-05-19 MED ORDER — MIDAZOLAM HCL 2 MG/2ML IJ SOLN
INTRAMUSCULAR | Status: AC
  Administered 2023-05-19: 2 mg
  Filled 2023-05-19: qty 2

## 2023-05-19 MED ORDER — BUPIVACAINE-EPINEPHRINE 0.25% -1:200000 IJ SOLN
INTRAMUSCULAR | Status: AC
  Filled 2023-05-19: qty 1

## 2023-05-19 MED ORDER — EPHEDRINE SULFATE (PRESSORS) 50 MG/ML IJ SOLN
INTRAMUSCULAR | Status: DC | PRN
Start: 2023-05-19 — End: 2023-05-19
  Administered 2023-05-19 (×5): 10 mg via INTRAVENOUS

## 2023-05-19 MED ORDER — KETOROLAC TROMETHAMINE 30 MG/ML IJ SOLN
INTRAMUSCULAR | Status: DC | PRN
  Administered 2023-05-19: 30 mg

## 2023-05-19 MED ORDER — LACTATED RINGERS IV SOLN: INTRAVENOUS | Status: DC

## 2023-05-19 MED ORDER — 0.9 % SODIUM CHLORIDE (POUR BTL) OPTIME
TOPICAL | Status: DC | PRN
  Administered 2023-05-19: 1000 mL

## 2023-05-19 MED ORDER — BISACODYL 10 MG RE SUPP: 10.0000 mg | Freq: Every day | RECTAL | Status: DC | PRN

## 2023-05-19 MED ORDER — FENTANYL CITRATE PF 50 MCG/ML IJ SOSY: 50.0000 ug | PREFILLED_SYRINGE | Freq: Once | INTRAMUSCULAR | Status: AC

## 2023-05-19 MED ORDER — HYDROMORPHONE HCL 2 MG PO TABS
2.0000 mg | ORAL_TABLET | ORAL | Status: DC | PRN
  Administered 2023-05-19 – 2023-05-21 (×9): 2 mg via ORAL
  Filled 2023-05-19 (×9): qty 1

## 2023-05-19 MED ORDER — ISOPROPYL ALCOHOL 70 % SOLN
Status: DC | PRN
  Administered 2023-05-19: 1 via TOPICAL

## 2023-05-19 MED ORDER — DOCUSATE SODIUM 100 MG PO CAPS
100.0000 mg | ORAL_CAPSULE | Freq: Two times a day (BID) | ORAL | Status: DC
  Administered 2023-05-19 – 2023-05-21 (×4): 100 mg via ORAL
  Filled 2023-05-19 (×4): qty 1

## 2023-05-19 MED ORDER — BUPIVACAINE IN DEXTROSE 0.75-8.25 % IT SOLN
INTRATHECAL | Status: DC | PRN
Start: 2023-05-19 — End: 2023-05-19
  Administered 2023-05-19: 1.6 mL via INTRATHECAL

## 2023-05-19 MED ORDER — NORTRIPTYLINE HCL 25 MG PO CAPS
25.0000 mg | ORAL_CAPSULE | Freq: Every day | ORAL | Status: DC
  Administered 2023-05-19 – 2023-05-20 (×2): 25 mg via ORAL
  Filled 2023-05-19 (×2): qty 1

## 2023-05-19 MED ORDER — PROPOFOL 10 MG/ML IV BOLUS
INTRAVENOUS | Status: AC
  Filled 2023-05-19: qty 20

## 2023-05-19 MED ORDER — MIDAZOLAM HCL 2 MG/2ML IJ SOLN: 2.0000 mg | Freq: Once | INTRAMUSCULAR | Status: AC

## 2023-05-19 MED ORDER — ALBUTEROL SULFATE HFA 108 (90 BASE) MCG/ACT IN AERS: 2.0000 | INHALATION_SPRAY | RESPIRATORY_TRACT | Status: DC | PRN

## 2023-05-19 MED ORDER — POLYETHYLENE GLYCOL 3350 17 G PO PACK: 17.0000 g | PACK | Freq: Every day | ORAL | Status: DC | PRN

## 2023-05-19 MED ORDER — PROPOFOL 500 MG/50ML IV EMUL
INTRAVENOUS | Status: DC | PRN
  Administered 2023-05-19: 20 mg via INTRAVENOUS
  Administered 2023-05-19: 50 ug/kg/min via INTRAVENOUS
  Administered 2023-05-19: 20 mg via INTRAVENOUS

## 2023-05-19 MED ORDER — LIDOCAINE HCL (PF) 2 % IJ SOLN
INTRAMUSCULAR | Status: AC
  Filled 2023-05-19: qty 5

## 2023-05-19 MED ORDER — CEFAZOLIN SODIUM-DEXTROSE 2-4 GM/100ML-% IV SOLN
2.0000 g | INTRAVENOUS | Status: AC
  Administered 2023-05-19: 2 g via INTRAVENOUS
  Filled 2023-05-19: qty 100

## 2023-05-19 MED ORDER — SODIUM CHLORIDE (PF) 0.9 % IJ SOLN
INTRAMUSCULAR | Status: DC | PRN
  Administered 2023-05-19: 30 mL

## 2023-05-19 MED ORDER — PANTOPRAZOLE SODIUM 40 MG PO TBEC
40.0000 mg | DELAYED_RELEASE_TABLET | Freq: Every day | ORAL | Status: DC
  Administered 2023-05-20 – 2023-05-21 (×2): 40 mg via ORAL
  Filled 2023-05-19 (×2): qty 1

## 2023-05-19 MED ORDER — DEXAMETHASONE SODIUM PHOSPHATE 10 MG/ML IJ SOLN
INTRAMUSCULAR | Status: DC | PRN
  Administered 2023-05-19: 10 mg

## 2023-05-19 MED ORDER — PHENOL 1.4 % MT LIQD: 1.0000 | OROMUCOSAL | Status: DC | PRN

## 2023-05-19 MED ORDER — FENTANYL CITRATE (PF) 100 MCG/2ML IJ SOLN
INTRAMUSCULAR | Status: DC | PRN
  Administered 2023-05-19 (×3): 50 ug via INTRAVENOUS

## 2023-05-19 MED ORDER — BUPIVACAINE-EPINEPHRINE 0.25% -1:200000 IJ SOLN
INTRAMUSCULAR | Status: DC | PRN
  Administered 2023-05-19: 30 mL

## 2023-05-19 MED ORDER — SODIUM CHLORIDE (PF) 0.9 % IJ SOLN
INTRAMUSCULAR | Status: AC
  Filled 2023-05-19: qty 50

## 2023-05-19 MED ORDER — ONDANSETRON HCL 4 MG/2ML IJ SOLN
4.0000 mg | Freq: Four times a day (QID) | INTRAMUSCULAR | Status: DC | PRN
  Administered 2023-05-19: 4 mg via INTRAVENOUS
  Filled 2023-05-19: qty 2

## 2023-05-19 MED ORDER — PROPOFOL 10 MG/ML IV BOLUS
INTRAVENOUS | Status: DC | PRN
  Administered 2023-05-19: 130 mg via INTRAVENOUS

## 2023-05-19 MED ORDER — TRANEXAMIC ACID-NACL 1000-0.7 MG/100ML-% IV SOLN
1000.0000 mg | INTRAVENOUS | Status: AC
  Administered 2023-05-19: 1000 mg via INTRAVENOUS
  Filled 2023-05-19: qty 100

## 2023-05-19 MED ORDER — METHOCARBAMOL 500 MG PO TABS
500.0000 mg | ORAL_TABLET | Freq: Four times a day (QID) | ORAL | Status: DC | PRN
  Administered 2023-05-20 – 2023-05-21 (×4): 500 mg via ORAL
  Filled 2023-05-19 (×4): qty 1

## 2023-05-19 MED ORDER — ALUM & MAG HYDROXIDE-SIMETH 200-200-20 MG/5ML PO SUSP: 30.0000 mL | ORAL | Status: DC | PRN

## 2023-05-19 MED ORDER — ORAL CARE MOUTH RINSE: 15.0000 mL | OROMUCOSAL | Status: DC | PRN

## 2023-05-19 MED ORDER — FENTANYL CITRATE (PF) 100 MCG/2ML IJ SOLN
INTRAMUSCULAR | Status: AC
  Filled 2023-05-19: qty 2

## 2023-05-19 MED ORDER — FAMOTIDINE 20 MG PO TABS
40.0000 mg | ORAL_TABLET | Freq: Every day | ORAL | Status: DC
  Administered 2023-05-19 – 2023-05-20 (×2): 40 mg via ORAL
  Filled 2023-05-19 (×2): qty 2

## 2023-05-19 MED ORDER — LACTATED RINGERS IV SOLN: INTRAVENOUS | Status: DC | PRN

## 2023-05-19 MED ORDER — ACETAMINOPHEN 500 MG PO TABS
1000.0000 mg | ORAL_TABLET | Freq: Once | ORAL | Status: DC
Start: 2023-05-19 — End: 2023-05-19

## 2023-05-19 MED ORDER — ROPIVACAINE HCL 5 MG/ML IJ SOLN
INTRAMUSCULAR | Status: DC | PRN
  Administered 2023-05-19: 20 mL via PERINEURAL

## 2023-05-19 MED ORDER — SODIUM CHLORIDE 0.9 % IR SOLN
Status: DC | PRN
  Administered 2023-05-19: 4000 mL

## 2023-05-19 MED ORDER — CHLORHEXIDINE GLUCONATE 0.12 % MT SOLN
15.0000 mL | Freq: Once | OROMUCOSAL | Status: AC
  Administered 2023-05-19: 15 mL via OROMUCOSAL

## 2023-05-19 MED ORDER — SENNA 8.6 MG PO TABS
1.0000 | ORAL_TABLET | Freq: Two times a day (BID) | ORAL | Status: DC
  Administered 2023-05-19 – 2023-05-21 (×4): 8.6 mg via ORAL
  Filled 2023-05-19 (×4): qty 1

## 2023-05-19 MED ORDER — PHENYLEPHRINE HCL-NACL 20-0.9 MG/250ML-% IV SOLN
INTRAVENOUS | Status: DC | PRN
  Administered 2023-05-19: 25 ug/min via INTRAVENOUS

## 2023-05-19 MED ORDER — POVIDONE-IODINE 10 % EX SWAB: 2.0000 | Freq: Once | CUTANEOUS | Status: AC

## 2023-05-19 MED ORDER — CEFAZOLIN SODIUM-DEXTROSE 2-4 GM/100ML-% IV SOLN
2.0000 g | Freq: Four times a day (QID) | INTRAVENOUS | Status: AC
  Administered 2023-05-19 – 2023-05-20 (×2): 2 g via INTRAVENOUS
  Filled 2023-05-19 (×2): qty 100

## 2023-05-19 MED ORDER — HYDROMORPHONE HCL 2 MG PO TABS: 1.0000 mg | ORAL_TABLET | ORAL | Status: DC | PRN

## 2023-05-19 MED ORDER — FLUOROMETHOLONE ACETATE 0.1 % OP SUSP: 1.0000 [drp] | Freq: Two times a day (BID) | OPHTHALMIC | Status: DC | PRN

## 2023-05-19 MED ORDER — FENTANYL CITRATE PF 50 MCG/ML IJ SOSY
25.0000 ug | PREFILLED_SYRINGE | INTRAMUSCULAR | Status: DC | PRN
  Administered 2023-05-19: 50 ug via INTRAVENOUS

## 2023-05-19 MED ORDER — MENTHOL 3 MG MT LOZG: 1.0000 | LOZENGE | OROMUCOSAL | Status: DC | PRN

## 2023-05-19 MED ORDER — KETOROLAC TROMETHAMINE 30 MG/ML IJ SOLN
INTRAMUSCULAR | Status: AC
  Filled 2023-05-19: qty 1

## 2023-05-19 MED ORDER — GABAPENTIN 300 MG PO CAPS
300.0000 mg | ORAL_CAPSULE | Freq: Two times a day (BID) | ORAL | Status: DC
  Administered 2023-05-19 – 2023-05-21 (×4): 300 mg via ORAL
  Filled 2023-05-19 (×4): qty 1

## 2023-05-19 MED ORDER — METOCLOPRAMIDE HCL 5 MG/ML IJ SOLN: 5.0000 mg | Freq: Three times a day (TID) | INTRAMUSCULAR | Status: DC | PRN

## 2023-05-19 MED ORDER — METOCLOPRAMIDE HCL 5 MG PO TABS
5.0000 mg | ORAL_TABLET | Freq: Three times a day (TID) | ORAL | Status: DC | PRN
  Administered 2023-05-21: 10 mg via ORAL
  Filled 2023-05-19: qty 2

## 2023-05-19 MED ORDER — ORAL CARE MOUTH RINSE: 15.0000 mL | Freq: Once | OROMUCOSAL | Status: AC

## 2023-05-19 MED ORDER — DIPHENHYDRAMINE HCL 12.5 MG/5ML PO ELIX: 12.5000 mg | ORAL_SOLUTION | ORAL | Status: DC | PRN

## 2023-05-19 MED ORDER — LORATADINE 10 MG PO TABS
10.0000 mg | ORAL_TABLET | Freq: Every day | ORAL | Status: DC
  Administered 2023-05-20 – 2023-05-21 (×2): 10 mg via ORAL
  Filled 2023-05-19 (×2): qty 1

## 2023-05-19 MED ORDER — EPINEPHRINE 0.3 MG/0.3ML IJ SOAJ: 0.3000 mg | INTRAMUSCULAR | Status: DC | PRN

## 2023-05-19 MED ORDER — ACETAMINOPHEN 500 MG PO TABS
1000.0000 mg | ORAL_TABLET | Freq: Four times a day (QID) | ORAL | Status: AC
  Administered 2023-05-19 – 2023-05-20 (×4): 1000 mg via ORAL
  Filled 2023-05-19 (×4): qty 2

## 2023-05-19 MED ORDER — METHOCARBAMOL 500 MG IVPB - SIMPLE MED
500.0000 mg | Freq: Four times a day (QID) | INTRAVENOUS | Status: DC | PRN
  Administered 2023-05-19: 500 mg via INTRAVENOUS

## 2023-05-19 SURGICAL SUPPLY — 72 items
ADH SKN CLS APL DERMABOND .7 (GAUZE/BANDAGES/DRESSINGS) ×2
APL PRP STRL LF DISP 70% ISPRP (MISCELLANEOUS) ×2
BAG COUNTER SPONGE SURGICOUNT (BAG) IMPLANT
BAG SPEC THK2 15X12 ZIP CLS (MISCELLANEOUS) ×1
BAG SPNG CNTER NS LX DISP (BAG) ×1
BAG ZIPLOCK 12X15 (MISCELLANEOUS) IMPLANT
BATTERY INSTRU NAVIGATION (MISCELLANEOUS) ×6 IMPLANT
BLADE SAW RECIPROCATING 77.5 (BLADE) ×2 IMPLANT
BNDG CMPR 5X4 KNIT ELC UNQ LF (GAUZE/BANDAGES/DRESSINGS) ×1
BNDG CMPR 5X6 CHSV STRCH STRL (GAUZE/BANDAGES/DRESSINGS)
BNDG CMPR 6 X 5 YARDS HK CLSR (GAUZE/BANDAGES/DRESSINGS) ×1
BNDG COHESIVE 6X5 TAN ST LF (GAUZE/BANDAGES/DRESSINGS) IMPLANT
BNDG ELASTIC 4INX 5YD STR LF (GAUZE/BANDAGES/DRESSINGS) ×2 IMPLANT
BNDG ELASTIC 6INX 5YD STR LF (GAUZE/BANDAGES/DRESSINGS) ×2 IMPLANT
BTRY SRG DRVR LF (MISCELLANEOUS) ×3
CHLORAPREP W/TINT 26 (MISCELLANEOUS) ×4 IMPLANT
COMP FEM PS KNEE NRW 6 LT (Joint) ×1 IMPLANT
COMP PATELLA PEG 3 32 (Joint) ×1 IMPLANT
COMPONENT FEM PS KNEE NRW 6 LT (Joint) IMPLANT
COMPONENT PATELLA PEG 3 32 (Joint) IMPLANT
COVER SURGICAL LIGHT HANDLE (MISCELLANEOUS) ×2 IMPLANT
DERMABOND ADVANCED .7 DNX12 (GAUZE/BANDAGES/DRESSINGS) ×4 IMPLANT
DRAPE SHEET LG 3/4 BI-LAMINATE (DRAPES) ×6 IMPLANT
DRAPE U-SHAPE 47X51 STRL (DRAPES) ×2 IMPLANT
DRSG AQUACEL AG ADV 3.5X10 (GAUZE/BANDAGES/DRESSINGS) ×2 IMPLANT
ELECT BLADE TIP CTD 4 INCH (ELECTRODE) ×2 IMPLANT
ELECT REM PT RETURN 15FT ADLT (MISCELLANEOUS) ×2 IMPLANT
GAUZE SPONGE 4X4 12PLY STRL (GAUZE/BANDAGES/DRESSINGS) ×2 IMPLANT
GLOVE BIO SURGEON STRL SZ7 (GLOVE) ×2 IMPLANT
GLOVE BIO SURGEON STRL SZ8.5 (GLOVE) ×4 IMPLANT
GLOVE BIOGEL PI IND STRL 7.5 (GLOVE) ×2 IMPLANT
GLOVE BIOGEL PI IND STRL 8.5 (GLOVE) ×2 IMPLANT
GOWN SPEC L3 XXLG W/TWL (GOWN DISPOSABLE) ×2 IMPLANT
GOWN STRL REUS W/ TWL XL LVL3 (GOWN DISPOSABLE) ×2 IMPLANT
GOWN STRL REUS W/TWL XL LVL3 (GOWN DISPOSABLE) ×1
HANDPIECE INTERPULSE COAX TIP (DISPOSABLE) ×1
HOLDER FOLEY CATH W/STRAP (MISCELLANEOUS) ×2 IMPLANT
HOOD PEEL AWAY T7 (MISCELLANEOUS) ×6 IMPLANT
KIT TURNOVER KIT A (KITS) IMPLANT
LINER TIB PS CD/3-9 10 LT (Liner) IMPLANT
MARKER SKIN DUAL TIP RULER LAB (MISCELLANEOUS) ×2 IMPLANT
NDL SAFETY ECLIP 18X1.5 (MISCELLANEOUS) ×2 IMPLANT
NDL SPNL 18GX3.5 QUINCKE PK (NEEDLE) ×2 IMPLANT
NEEDLE SPNL 18GX3.5 QUINCKE PK (NEEDLE) ×1 IMPLANT
NS IRRIG 1000ML POUR BTL (IV SOLUTION) ×2 IMPLANT
PACK TOTAL KNEE CUSTOM (KITS) ×2 IMPLANT
PADDING CAST COTTON 6X4 STRL (CAST SUPPLIES) ×2 IMPLANT
PIN DRILL HDLS TROCAR 75 4PK (PIN) IMPLANT
PROTECTOR NERVE ULNAR (MISCELLANEOUS) ×2 IMPLANT
SAW OSC TIP CART 19.5X105X1.3 (SAW) ×2 IMPLANT
SCREW FEMALE HEX FIX 25X2.5 (ORTHOPEDIC DISPOSABLE SUPPLIES) IMPLANT
SEALER BIPOLAR AQUA 6.0 (INSTRUMENTS) ×2 IMPLANT
SET HNDPC FAN SPRY TIP SCT (DISPOSABLE) ×2 IMPLANT
SET PAD KNEE POSITIONER (MISCELLANEOUS) ×2 IMPLANT
SOLUTION PRONTOSAN WOUND 350ML (IRRIGATION / IRRIGATOR) IMPLANT
SPIKE FLUID TRANSFER (MISCELLANEOUS) ×4 IMPLANT
STEM TIB PS KNEE D 0D LT (Stem) IMPLANT
SUT MNCRL AB 3-0 PS2 18 (SUTURE) ×2 IMPLANT
SUT MON AB 2-0 CT1 36 (SUTURE) ×2 IMPLANT
SUT STRATAFIX PDO 1 14 VIOLET (SUTURE) ×1
SUT STRATFX PDO 1 14 VIOLET (SUTURE) ×1
SUT VIC AB 1 CTX 36 (SUTURE) ×2
SUT VIC AB 1 CTX36XBRD ANBCTR (SUTURE) ×4 IMPLANT
SUT VIC AB 2-0 CT1 27 (SUTURE) ×1
SUT VIC AB 2-0 CT1 TAPERPNT 27 (SUTURE) ×2 IMPLANT
SUTURE STRATFX PDO 1 14 VIOLET (SUTURE) ×2 IMPLANT
SYR 3ML LL SCALE MARK (SYRINGE) ×2 IMPLANT
TRAY FOLEY MTR SLVR 14FR STAT (SET/KITS/TRAYS/PACK) IMPLANT
TRAY FOLEY MTR SLVR 16FR STAT (SET/KITS/TRAYS/PACK) IMPLANT
TUBE SUCTION HIGH CAP CLEAR NV (SUCTIONS) ×2 IMPLANT
WATER STERILE IRR 1000ML POUR (IV SOLUTION) ×4 IMPLANT
WRAP KNEE MAXI GEL POST OP (GAUZE/BANDAGES/DRESSINGS) IMPLANT

## 2023-05-19 NOTE — Anesthesia Postprocedure Evaluation (Signed)
Anesthesia Post Note  Patient: Bridget Stevens  Procedure(s) Performed: COMPUTER ASSISTED TOTAL KNEE ARTHROPLASTY (Left: Knee)     Patient location during evaluation: PACU Anesthesia Type: General Level of consciousness: awake and alert Pain management: pain level controlled Vital Signs Assessment: post-procedure vital signs reviewed and stable Respiratory status: spontaneous breathing, nonlabored ventilation, respiratory function stable and patient connected to nasal cannula oxygen Cardiovascular status: blood pressure returned to baseline and stable Postop Assessment: no apparent nausea or vomiting Anesthetic complications: no  No notable events documented.  Last Vitals:  Vitals:   05/19/23 1600 05/19/23 1615  BP: (!) 115/59 118/65  Pulse: 84 86  Resp: 10 18  Temp:    SpO2: 99% 100%    Last Pain:  Vitals:   05/19/23 1615  TempSrc:   PainSc: Asleep                 Temple Sporer L Nidhi Jacome

## 2023-05-19 NOTE — Op Note (Signed)
OPERATIVE REPORT  SURGEON: Samson Frederic, MD   ASSISTANT: Clint Bolder, PA-C  PREOPERATIVE DIAGNOSIS: Primary Left knee arthritis.   POSTOPERATIVE DIAGNOSIS: Primary Left knee arthritis.   PROCEDURE: Computer assisted Left total knee arthroplasty.   IMPLANTS: Zimmer Persona PPS Cementless CR femur, size 6 Narrow. Persona 0 degree Spiked Keel OsseoTi Tibia, size D. Vivacit-E polyethelyene insert, size 10 mm, CR. OsseoTi 3-Peg patella, size 32 mm.  ANESTHESIA:  MAC, Regional, and Spinal  TOURNIQUET TIME: Not utilized.   ESTIMATED BLOOD LOSS:-100 mL    ANTIBIOTICS: 2g Ancef.  DRAINS: None.  COMPLICATIONS: None   CONDITION: PACU - hemodynamically stable.   BRIEF CLINICAL NOTE: Bridget Stevens is a 52 y.o. female with a long-standing history of Left knee arthritis. After failing conservative management, the patient was indicated for total knee arthroplasty. The risks, benefits, and alternatives to the procedure were explained, and the patient elected to proceed.  PROCEDURE IN DETAIL: Adductor canal block was obtained in the pre-op holding area. Once inside the operative room, spinal anesthesia was obtained, and a foley catheter was inserted. The patient was then positioned and the lower extremity was prepped and draped in the normal sterile surgical fashion.  A time-out was called verifying side and site of surgery. The patient received IV antibiotics within 60 minutes of beginning the procedure. A tourniquet was not utilized.   An anterior approach to the knee was performed utilizing a midvastus arthrotomy. A medial release was performed and the patellar fat pad was excised. Stryker imageless navigation was used to cut the distal femur perpendicular to the mechanical axis. A freehand patellar resection was performed, and the patella was sized an prepared with 3 lug holes.  Nagivation was used to make a neutral proximal tibia resection, taking 4 mm of bone from the less affected medial  side with 3 degrees of slope. The menisci were excised. A spacer block was placed, and the alignment and balance in extension were confirmed.   The distal femur was sized using the 3-degree external rotation guide referencing the posterior femoral cortex. The appropriate 4-in-1 cutting block was pinned into place. Rotation was checked using Whiteside's line, the epicondylar axis, and then confirmed with a spacer block in flexion. The remaining femoral cuts were performed, taking care to protect the MCL.  The tibia was sized and the trial tray was pinned into place. The remaining trail components were inserted. The knee was stable to varus and valgus stress through a full range of motion. The patella tracked centrally, and the PCL was well balanced. The trial components were removed, and the proximal tibial surface was prepared. Final components were impacted into place. The knee was tested for a final time and found to be well balanced.   The wound was copiously irrigated with Prontosan solution and normal saline using pulse lavage.  Marcaine solution was injected into the periarticular soft tissue.  The wound was closed in layers using #1 Vicryl and Stratafix for the fascia, 2-0 Vicryl for the subcutaneous fat, 2-0 Monocryl for the deep dermal layer, 3-0 running Monocryl subcuticular Stitch, and 4-0 Monocryl stay sutures at both ends of the wound. Dermabond was applied to the skin.  Once the glue was fully dried, an Aquacell Ag and compressive dressing were applied.  The patient was transported to the recovery room in stable condition.  Sponge, needle, and instrument counts were correct at the end of the case x2.  The patient tolerated the procedure well and there were no known  complications.  The aquamantis was utilized for this case to help facilitate better hemostasis as patient was felt to be at increased risk of bleeding because of complex case requiring increased OR time and/or exposure.  -minimally  invasive approach.  A oscillating saw tip was utilized for this case to prevent damage to the soft tissue structures such as muscles, ligaments and tendons, and to ensure accurate bone cuts. This patient was at increased risk for above structures due to  minimally invasive approach.  Please note that a surgical assistant was a medical necessity for this procedure in order to perform it in a safe and expeditious manner. Surgical assistant was necessary to retract the ligaments and vital neurovascular structures to prevent injury to them and also necessary for proper positioning of the limb to allow for anatomic placement of the prosthesis.

## 2023-05-19 NOTE — Plan of Care (Signed)
  Problem: Education: Goal: Knowledge of the prescribed therapeutic regimen will improve Outcome: Progressing   Problem: Pain Management: Goal: Pain level will decrease with appropriate interventions Outcome: Progressing   Problem: Nutrition: Goal: Adequate nutrition will be maintained Outcome: Progressing   

## 2023-05-19 NOTE — Anesthesia Procedure Notes (Addendum)
Anesthesia Regional Block: Adductor canal block   Pre-Anesthetic Checklist: , timeout performed,  Correct Patient, Correct Site, Correct Laterality,  Correct Procedure, Correct Position, site marked,  Risks and benefits discussed,  Surgical consent,  Pre-op evaluation,  At surgeon's request and post-op pain management  Laterality: Left  Prep: chloraprep       Needles:  Injection technique: Single-shot  Needle Type: Echogenic Needle     Needle Length: 9cm  Needle Gauge: 21     Additional Needles:   Procedures:,,,, ultrasound used (permanent image in chart),,    Narrative:  Start time: 05/19/2023 11:59 AM End time: 05/19/2023 12:06 PM Injection made incrementally with aspirations every 5 mL.  Performed by: Personally  Anesthesiologist: Collene Schlichter, MD  Additional Notes: No pain on injection. No increased resistance to injection. Injection made in 5cc increments.  Good needle visualization.  Patient tolerated procedure well.

## 2023-05-19 NOTE — Transfer of Care (Signed)
Immediate Anesthesia Transfer of Care Note  Patient: Bridget Stevens  Procedure(s) Performed: COMPUTER ASSISTED TOTAL KNEE ARTHROPLASTY (Left: Knee)  Patient Location: PACU  Anesthesia Type:GA combined with regional for post-op pain  Level of Consciousness: alert   Airway & Oxygen Therapy: Patient Spontanous Breathing and Patient connected to nasal cannula oxygen  Post-op Assessment: Report given to RN and Post -op Vital signs reviewed and stable  Post vital signs: Reviewed and stable  Last Vitals:  Vitals Value Taken Time  BP 118/73 05/19/23 1538  Temp    Pulse 79 05/19/23 1539  Resp 12 05/19/23 1539  SpO2 100 % 05/19/23 1539  Vitals shown include unfiled device data.  Last Pain:  Vitals:   05/19/23 1220  TempSrc:   PainSc: 0-No pain         Complications: No notable events documented.

## 2023-05-19 NOTE — Anesthesia Procedure Notes (Signed)
Spinal  Patient location during procedure: OR Start time: 05/19/2023 12:52 PM End time: 05/19/2023 12:55 PM Reason for block: surgical anesthesia Staffing Performed: anesthesiologist  Anesthesiologist: Collene Schlichter, MD Performed by: Collene Schlichter, MD Authorized by: Collene Schlichter, MD   Preanesthetic Checklist Completed: patient identified, IV checked, risks and benefits discussed, surgical consent, monitors and equipment checked, pre-op evaluation and timeout performed Spinal Block Patient position: sitting Prep: DuraPrep and site prepped and draped Patient monitoring: continuous pulse ox and blood pressure Approach: midline Location: L3-4 Injection technique: single-shot Needle Needle type: Pencan  Needle gauge: 24 G Assessment Events: CSF return Additional Notes Functioning IV was confirmed and monitors were applied. Sterile prep and drape, including hand hygiene, mask and sterile gloves were used. The patient was positioned and the spine was prepped. The skin was anesthetized with lidocaine.  Free flow of clear CSF was obtained prior to injecting local anesthetic into the CSF.  The spinal needle aspirated freely following injection.  The needle was carefully withdrawn.  The patient tolerated the procedure well. Consent was obtained prior to procedure with all questions answered and concerns addressed. Risks including but not limited to bleeding, infection, nerve damage, paralysis, failed block, inadequate analgesia, allergic reaction, high spinal, itching and headache were discussed and the patient wished to proceed.   Bridget Aran, MD

## 2023-05-19 NOTE — Interval H&P Note (Signed)
History and Physical Interval Note:  05/19/2023 10:21 AM  Bridget Stevens  has presented today for surgery, with the diagnosis of Left knee osteoarthritis.  The various methods of treatment have been discussed with the patient and family. After consideration of risks, benefits and other options for treatment, the patient has consented to  Procedure(s) with comments: COMPUTER ASSISTED TOTAL KNEE ARTHROPLASTY (Left) - 160 as a surgical intervention.  The patient's history has been reviewed, patient examined, no change in status, stable for surgery.  I have reviewed the patient's chart and labs.  Questions were answered to the patient's satisfaction.     Iline Oven Cache Bills

## 2023-05-19 NOTE — Discharge Instructions (Signed)
 Dr. Brian Swinteck Total Joint Specialist [facility] [address] [place], [place] [address] [phone]  TOTAL KNEE REPLACEMENT POSTOPERATIVE DIRECTIONS    Knee Rehabilitation, Guidelines Following Surgery  Results after knee surgery are often greatly improved when you follow the exercise, range of motion and muscle strengthening exercises prescribed by your doctor. Safety measures are also important to protect the knee from further injury. Any time any of these exercises cause you to have increased pain or swelling in your knee joint, decrease the amount until you are comfortable again and slowly increase them. If you have problems or questions, call your caregiver or physical therapist for advice.   WEIGHT BEARING Weight bearing as tolerated with assist device (walker, cane, etc) as directed, use it as long as suggested by your surgeon or therapist, typically at least 4-6 weeks.  HOME CARE INSTRUCTIONS  Remove items at home which could result in a fall. This includes throw rugs or furniture in walking pathways.  Continue medications as instructed at time of discharge. You may have some home medications which will be placed on hold until you complete the course of blood thinner medication.  You may start showering once you are discharged home but do not submerge the incision under water. Just pat the incision dry and apply a dry gauze dressing on daily. Walk with walker as instructed.  You may resume a sexual relationship in one month or when given the OK by your doctor.  Use walker as long as suggested by your caregivers. Avoid periods of inactivity such as sitting longer than an hour when not asleep. This helps prevent blood clots.  You may put full weight on your legs and walk as much as is comfortable.  You may return to work once you are cleared by your doctor.  Do not drive a car for 6 weeks or until released by you surgeon.  Do not drive while  taking narcotics.  Wear the elastic stockings for three weeks following surgery during the day but you may remove then at night. Make sure you keep all of your appointments after your operation with all of your doctors and caregivers. You should call the office at the above phone number and make an appointment for approximately two weeks after the date of your surgery. Do not remove your surgical dressing. The dressing is waterproof; you may take showers in 3 days, but do not take tub baths or submerge the dressing. Please pick up a stool softener and laxative for home use as long as you are requiring pain medications. ICE to the affected knee every three hours for 30 minutes at a time and then as needed for pain and swelling.  Continue to use ice on the knee for pain and swelling from surgery. You may notice swelling that will progress down to the foot and ankle.  This is normal after surgery.  Elevate the leg when you are not up walking on it.   It is important for you to complete the blood thinner medication as prescribed by your doctor. Continue to use the breathing machine which will help keep your temperature down.  It is common for your temperature to cycle up and down following surgery, especially at night when you are not up moving around and exerting yourself.  The breathing machine keeps your lungs expanded and your temperature down.  RANGE OF MOTION AND STRENGTHENING EXERCISES  Rehabilitation of the knee is important following a knee injury or an   operation. After just a few days of immobilization, the muscles of the thigh which control the knee become weakened and shrink (atrophy). Knee exercises are designed to build up the tone and strength of the thigh muscles and to improve knee motion. Often times heat used for twenty to thirty minutes before working out will loosen up your tissues and help with improving the range of motion but do not use heat for the first two weeks following surgery.  These exercises can be done on a training (exercise) mat, on the floor, on a table or on a bed. Use what ever works the best and is most comfortable for you Knee exercises include:  Leg Lifts - While your knee is still immobilized in a splint or cast, you can do straight leg raises. Lift the leg to 60 degrees, hold for 3 sec, and slowly lower the leg. Repeat 10-20 times 2-3 times daily. Perform this exercise against resistance later as your knee gets better.  Quad and Hamstring Sets - Tighten up the muscle on the front of the thigh (Quad) and hold for 5-10 sec. Repeat this 10-20 times hourly. Hamstring sets are done by pushing the foot backward against an object and holding for 5-10 sec. Repeat as with quad sets.  A rehabilitation program following serious knee injuries can speed recovery and prevent re-injury in the future due to weakened muscles. Contact your doctor or a physical therapist for more information on knee rehabilitation.   POST-OPERATIVE OPIOID TAPER INSTRUCTIONS: It is important to wean off of your opioid medication as soon as possible. If you do not need pain medication after your surgery it is ok to stop day one. Opioids include: Codeine, Hydrocodone(Norco, Vicodin), Oxycodone(Percocet, oxycontin) and hydromorphone amongst others.  Long term and even short term use of opiods can cause: Increased pain response Dependence Constipation Depression Respiratory depression And more.  Withdrawal symptoms can include Flu like symptoms Nausea, vomiting And more Techniques to manage these symptoms Hydrate well Eat regular healthy meals Stay active Use relaxation techniques(deep breathing, meditating, yoga) Do Not substitute Alcohol to help with tapering If you have been on opioids for less than two weeks and do not have pain than it is ok to stop all together.  Plan to wean off of opioids This plan should start within one week post op of your joint replacement. Maintain the same  interval or time between taking each dose and first decrease the dose.  Cut the total daily intake of opioids by one tablet each day Next start to increase the time between doses. The last dose that should be eliminated is the evening dose.    SKILLED REHAB INSTRUCTIONS: If the patient is transferred to a skilled rehab facility following release from the hospital, a list of the current medications will be sent to the facility for the patient to continue.  When discharged from the skilled rehab facility, please have the facility set up the patient's Home Health Physical Therapy prior to being released. Also, the skilled facility will be responsible for providing the patient with their medications at time of release from the facility to include their pain medication, the muscle relaxants, and their blood thinner medication. If the patient is still at the rehab facility at time of the two week follow up appointment, the skilled rehab facility will also need to assist the patient in arranging follow up appointment in our office and any transportation needs.  MAKE SURE YOU:  Understand these instructions.  Will watch   your condition.  Will get help right away if you are not doing well or get worse.    Pick up stool softner and laxative for home use following surgery while on pain medications. Do NOT remove your dressing. You may shower.  Do not take tub baths or submerge incision under water. May shower starting three days after surgery. Please use a clean towel to pat the incision dry following showers. Continue to use ice for pain and swelling after surgery. Do not use any lotions or creams on the incision until instructed by your surgeon.  

## 2023-05-19 NOTE — Anesthesia Procedure Notes (Signed)
Procedure Name: Intubation Date/Time: 05/19/2023 1:54 PM  Performed by: Orest Dikes, CRNAPre-anesthesia Checklist: Patient identified, Emergency Drugs available, Suction available and Patient being monitored Patient Re-evaluated:Patient Re-evaluated prior to induction Oxygen Delivery Method: Circle system utilized Preoxygenation: Pre-oxygenation with 100% oxygen Induction Type: IV induction Ventilation: Mask ventilation without difficulty LMA: LMA inserted LMA Size: 4.0 Tube type: Oral Number of attempts: 1 Placement Confirmation: positive ETCO2 and breath sounds checked- equal and bilateral Tube secured with: Tape Dental Injury: Teeth and Oropharynx as per pre-operative assessment

## 2023-05-19 NOTE — H&P (View-Only) (Signed)
TOTAL KNEE ADMISSION H&P  Patient is being admitted for left total knee arthroplasty.  Subjective:  Chief Complaint:left knee pain.  HPI: Bridget Stevens, 52 y.o. female, has a history of pain and functional disability in the left knee due to arthritis and has failed non-surgical conservative treatments for greater than 12 weeks to includeNSAID's and/or analgesics, corticosteriod injections, viscosupplementation injections, flexibility and strengthening excercises, use of assistive devices, and activity modification.  Onset of symptoms was gradual, starting 10 years ago with rapidlly worsening course since that time. The patient noted prior procedures on the knee to include  arthroscopy on the left knee(s).  Patient currently rates pain in the left knee(s) at 10 out of 10 with activity. Patient has night pain, worsening of pain with activity and weight bearing, pain that interferes with activities of daily living, pain with passive range of motion, crepitus, and joint swelling.  Patient has evidence of subchondral cysts, subchondral sclerosis, periarticular osteophytes, and joint space narrowing by imaging studies. There is no active infection.  Patient Active Problem List   Diagnosis Date Noted   Bacteremia 03/27/2022   GERD (gastroesophageal reflux disease) 03/27/2022   Acute pyelonephritis 03/27/2022   Asthma 03/27/2022   Vaginal atrophy 10/09/2020   Pelvic pain in female 09/22/2020   Ovarian cyst 08/28/2020   S/P cervical spinal fusion 02/18/2020   S/P lumbar fusion 09/05/2019   S/P endometrial ablation - 04/2012 05/29/2012   Past Medical History:  Diagnosis Date   Anemia    Anxiety    Arthritis    Asthma    GERD (gastroesophageal reflux disease)    Headache    Hepatomegaly    Hx of migraines    Hyperlipidemia    Ovarian cyst    Thyroid disease     Past Surgical History:  Procedure Laterality Date   ANTERIOR CERVICAL DECOMP/DISCECTOMY FUSION N/A 02/18/2020   Procedure:  Anterior Cervical Decompression/Discectomy Fusion - Cervical five-Cervcal six;  Surgeon: Tia Alert, MD;  Location: [facility] OR;  Service: Neurosurgery;  Laterality: N/A;   BACK SURGERY     BTL  07/30/2008   CARPAL TUNNEL RELEASE Right    CARPAL TUNNEL RELEASE Left 09/22/2016   Procedure: CARPAL TUNNEL RELEASE, left;  Surgeon: Dairl Ponder, MD;  Location: [facility];  Service: Orthopedics;  Laterality: Left;   CESAREAN SECTION     CYST EXCISION Right 12/15/2022   DORSAL COMPARTMENT RELEASE Left 09/22/2016   Procedure: RELEASE DORSAL COMPARTMENT (DEQUERVAIN);  Surgeon: Dairl Ponder, MD;  Location: [facility];  Service: Orthopedics;  Laterality: Left;   ELBOW SURGERY Right    gastric by pass     HYSTEROSCOPY W/ ENDOMETRIAL ABLATION     KNEE SURGERY Left    meniscus tear and a spur   TUBAL LIGATION     WISDOM TOOTH EXTRACTION      Current Outpatient Medications  Medication Sig Dispense Refill Last Dose   acetaminophen (TYLENOL) 500 MG tablet Take 1,000 mg by mouth every 6 (six) hours as needed for moderate pain.       albuterol (PROVENTIL HFA;VENTOLIN HFA) 108 (90 Base) MCG/ACT inhaler Inhale 2 puffs into the lungs every 4 (four) hours as needed for wheezing or shortness of breath.       CALCIUM CITRATE PO Take 600 mg by mouth daily.      cetirizine (ZYRTEC) 10 MG tablet Take 10 mg by mouth daily.      Cholecalciferol (VITAMIN D) 50 MCG (2000 UT) CAPS Take  2,000 Units by mouth daily.      EPINEPHrine 0.3 mg/0.3 mL IJ SOAJ injection Inject 0.3 mg into the muscle as needed for anaphylaxis.      famotidine (PEPCID) 40 MG tablet Take 40 mg by mouth at bedtime.      FLAREX 0.1 % ophthalmic suspension Apply 1 drop to eye 2 (two) times daily as needed (itching eyes).      gabapentin (NEURONTIN) 300 MG capsule Take 300 mg by mouth 2 (two) times daily.      Lifitegrast (XIIDRA) 5 % SOLN Place 1 drop into both eyes 2 (two) times daily as needed (dry eyes).       methocarbamol (ROBAXIN) 500 MG tablet Take 500 mg by mouth at bedtime.      naratriptan (AMERGE) 2.5 MG tablet Take 1 tablet (2.5 mg total) by mouth as needed for migraine. Take one (1) tablet at onset of headache; if returns or does not resolve, may repeat after 4 hours; do not exceed five (5) mg in 24 hours. 10 tablet 11    nortriptyline (PAMELOR) 25 MG capsule Take 1 capsule (25 mg total) by mouth at bedtime. 30 capsule 5    omeprazole (PRILOSEC) 40 MG capsule Take 40 mg by mouth daily.      Pediatric Multiple Vit-C-FA (MULTIVITAMIN ANIMAL SHAPES, WITH CA/FA,) with C & FA chewable tablet Chew 1 tablet by mouth daily.      No current facility-administered medications for this visit.   Allergies  Allergen Reactions   Shellfish Allergy Anaphylaxis   Peanut-Containing Drug Products Swelling    SWELLING REACTION UNSPECIFIED    Sulfa Antibiotics Hives   Ibuprofen Other (See Comments)    Due to gastric surgery   Oxycodone-Acetaminophen Other (See Comments)    Causes Headaches, Dizziness    Triamcinolone Other (See Comments)    Pt is unsure of reaction    Percocet [Oxycodone-Acetaminophen] Other (See Comments)    PT STATES THAT SHE HAS HEADACHES WITH THIS MED    Social History   Tobacco Use   Smoking status: Never   Smokeless tobacco: Never  Substance Use Topics   Alcohol use: Not Currently    Family History  Problem Relation Age of Onset   Cancer Mother    Hypertension Mother    Thyroid disease Mother    Diabetes Father    Hypertension Father    Hyperlipidemia Father    Thyroid disease Father    Asthma Father    Cancer Sister        CERVICAL   Thyroid disease Sister    Asthma Sister    Asthma Brother    Breast cancer Maternal Grandmother        in her 74s     Review of Systems  Musculoskeletal:  Positive for arthralgias, gait problem and joint swelling.    Objective:  Physical Exam Constitutional:      Appearance: Normal appearance.  HENT:     Head:  Normocephalic and atraumatic.     Nose: Nose normal.     Mouth/Throat:     Mouth: Mucous membranes are moist.     Pharynx: Oropharynx is clear.  Eyes:     Conjunctiva/sclera: Conjunctivae normal.  Cardiovascular:     Rate and Rhythm: Normal rate and regular rhythm.     Pulses: Normal pulses.     Heart sounds: Normal heart sounds.  Pulmonary:     Effort: Pulmonary effort is normal.     Breath sounds: Normal breath  sounds.  Abdominal:     General: Abdomen is flat.     Palpations: Abdomen is soft.  Genitourinary:    Comments: deferred Musculoskeletal:     Cervical back: Normal range of motion and neck supple.     Comments: Examination of the left knee reveals healed arthroscopy portals. She has swelling, trace effusion. No warmth or erythema. Mild valgus deformity. Tenderness to palpation medial joint line, lateral joint line, peripatellar retinacular tissues with a positive grind sign. Her range of motion is 24 to 100 degrees without any ligamentous instability. No extensor lag. Painless range of motion of the hip  Distally, there is no focal motor or sensory deficit. She has palpable pedal pulses.  Skin:    General: Skin is warm and dry.     Capillary Refill: Capillary refill takes less than 2 seconds.  Neurological:     General: No focal deficit present.     Mental Status: She is alert and oriented to person, place, and time.  Psychiatric:        Mood and Affect: Mood normal.        Behavior: Behavior normal.        Thought Content: Thought content normal.        Judgment: Judgment normal.     Vital signs in last 24 hours: @VSRANGES @  Labs:   Estimated body mass index is 28.51 kg/m as calculated from the following:   Height as of 05/09/23: 5' (1.524 m).   Weight as of 05/09/23: 66.2 kg.   Imaging Review Plain radiographs demonstrate severe degenerative joint disease of the left knee(s). The overall alignment ismild valgus. The bone quality appears to be adequate for  age and reported activity level.      Assessment/Plan:  End stage arthritis, left knee   The patient history, physical examination, clinical judgment of the provider and imaging studies are consistent with end stage degenerative joint disease of the left knee(s) and total knee arthroplasty is deemed medically necessary. The treatment options including medical management, injection therapy arthroscopy and arthroplasty were discussed at length. The risks and benefits of total knee arthroplasty were presented and reviewed. The risks due to aseptic loosening, infection, stiffness, patella tracking problems, thromboembolic complications and other imponderables were discussed. The patient acknowledged the explanation, agreed to proceed with the plan and consent was signed. Patient is being admitted for inpatient treatment for surgery, pain control, PT, OT, prophylactic antibiotics, VTE prophylaxis, progressive ambulation and ADL's and discharge planning. The patient is planning to be discharged home with OOPT after an overnight stay.   Therapy Plans: outpatient therapy. PT at [facility] friendly today and need scheduled for 05/23/23.  Disposition: Home with Okey Regal, significant other. * Planned DVT Prophylaxis: aspirin 81mg  BID DME needed: walker.  PCP: Cleared.  Cardiology: Cleared.  TXA: IV Allergies:  - Oxycodone - headaches maybe dizziness.  - Sulfa antibiotics - hives - Shellfish - anaphylaxis.  - Peanuts - swelling.  - NSAIDs - gastric bypass.  Anesthesia Concerns: None.  BMI: 28.5 Last HgbA1c: 5.5 Other:* - Issues with orthostatic hypotension.  - History of gastric bypass. No NSAIDs.  - History of left knee arthroscopy.  - Oxycodone vs ?? due to tolerance, zofran. Has methocarbamol.  - 05/09/23: Hgb 13.8, no BMP.     Patient's anticipated LOS is less than 2 midnights, meeting these requirements: - Younger than 1 - Lives within 1 hour of care - Has a competent adult at home to recover  with post-op recover -  NO history of  - Chronic pain requiring opiods  - Diabetes  - Coronary Artery Disease  - Heart failure  - Heart attack  - Stroke  - DVT/VTE  - Cardiac arrhythmia  - Respiratory Failure/COPD  - Renal failure  - Anemia  - Advanced Liver disease

## 2023-05-19 NOTE — H&P (Signed)
TOTAL KNEE ADMISSION H&P  Patient is being admitted for left total knee arthroplasty.  Subjective:  Chief Complaint:left knee pain.  HPI: Bridget Stevens, 52 y.o. female, has a history of pain and functional disability in the left knee due to arthritis and has failed non-surgical conservative treatments for greater than 12 weeks to includeNSAID's and/or analgesics, corticosteriod injections, viscosupplementation injections, flexibility and strengthening excercises, use of assistive devices, and activity modification.  Onset of symptoms was gradual, starting 10 years ago with rapidlly worsening course since that time. The patient noted prior procedures on the knee to include  arthroscopy on the left knee(s).  Patient currently rates pain in the left knee(s) at 10 out of 10 with activity. Patient has night pain, worsening of pain with activity and weight bearing, pain that interferes with activities of daily living, pain with passive range of motion, crepitus, and joint swelling.  Patient has evidence of subchondral cysts, subchondral sclerosis, periarticular osteophytes, and joint space narrowing by imaging studies. There is no active infection.  Patient Active Problem List   Diagnosis Date Noted   Bacteremia 03/27/2022   GERD (gastroesophageal reflux disease) 03/27/2022   Acute pyelonephritis 03/27/2022   Asthma 03/27/2022   Vaginal atrophy 10/09/2020   Pelvic pain in female 09/22/2020   Ovarian cyst 08/28/2020   S/P cervical spinal fusion 02/18/2020   S/P lumbar fusion 09/05/2019   S/P endometrial ablation - 04/2012 05/29/2012   Past Medical History:  Diagnosis Date   Anemia    Anxiety    Arthritis    Asthma    GERD (gastroesophageal reflux disease)    Headache    Hepatomegaly    Hx of migraines    Hyperlipidemia    Ovarian cyst    Thyroid disease     Past Surgical History:  Procedure Laterality Date   ANTERIOR CERVICAL DECOMP/DISCECTOMY FUSION N/A 02/18/2020   Procedure:  Anterior Cervical Decompression/Discectomy Fusion - Cervical five-Cervcal six;  Surgeon: Tia Alert, MD;  Location: [facility] OR;  Service: Neurosurgery;  Laterality: N/A;   BACK SURGERY     BTL  07/30/2008   CARPAL TUNNEL RELEASE Right    CARPAL TUNNEL RELEASE Left 09/22/2016   Procedure: CARPAL TUNNEL RELEASE, left;  Surgeon: Dairl Ponder, MD;  Location: [facility];  Service: Orthopedics;  Laterality: Left;   CESAREAN SECTION     CYST EXCISION Right 12/15/2022   DORSAL COMPARTMENT RELEASE Left 09/22/2016   Procedure: RELEASE DORSAL COMPARTMENT (DEQUERVAIN);  Surgeon: Dairl Ponder, MD;  Location: [facility];  Service: Orthopedics;  Laterality: Left;   ELBOW SURGERY Right    gastric by pass     HYSTEROSCOPY W/ ENDOMETRIAL ABLATION     KNEE SURGERY Left    meniscus tear and a spur   TUBAL LIGATION     WISDOM TOOTH EXTRACTION      Current Outpatient Medications  Medication Sig Dispense Refill Last Dose   acetaminophen (TYLENOL) 500 MG tablet Take 1,000 mg by mouth every 6 (six) hours as needed for moderate pain.       albuterol (PROVENTIL HFA;VENTOLIN HFA) 108 (90 Base) MCG/ACT inhaler Inhale 2 puffs into the lungs every 4 (four) hours as needed for wheezing or shortness of breath.       CALCIUM CITRATE PO Take 600 mg by mouth daily.      cetirizine (ZYRTEC) 10 MG tablet Take 10 mg by mouth daily.      Cholecalciferol (VITAMIN D) 50 MCG (2000 UT) CAPS Take  2,000 Units by mouth daily.      EPINEPHrine 0.3 mg/0.3 mL IJ SOAJ injection Inject 0.3 mg into the muscle as needed for anaphylaxis.      famotidine (PEPCID) 40 MG tablet Take 40 mg by mouth at bedtime.      FLAREX 0.1 % ophthalmic suspension Apply 1 drop to eye 2 (two) times daily as needed (itching eyes).      gabapentin (NEURONTIN) 300 MG capsule Take 300 mg by mouth 2 (two) times daily.      Lifitegrast (XIIDRA) 5 % SOLN Place 1 drop into both eyes 2 (two) times daily as needed (dry eyes).       methocarbamol (ROBAXIN) 500 MG tablet Take 500 mg by mouth at bedtime.      naratriptan (AMERGE) 2.5 MG tablet Take 1 tablet (2.5 mg total) by mouth as needed for migraine. Take one (1) tablet at onset of headache; if returns or does not resolve, may repeat after 4 hours; do not exceed five (5) mg in 24 hours. 10 tablet 11    nortriptyline (PAMELOR) 25 MG capsule Take 1 capsule (25 mg total) by mouth at bedtime. 30 capsule 5    omeprazole (PRILOSEC) 40 MG capsule Take 40 mg by mouth daily.      Pediatric Multiple Vit-C-FA (MULTIVITAMIN ANIMAL SHAPES, WITH CA/FA,) with C & FA chewable tablet Chew 1 tablet by mouth daily.      No current facility-administered medications for this visit.   Allergies  Allergen Reactions   Shellfish Allergy Anaphylaxis   Peanut-Containing Drug Products Swelling    SWELLING REACTION UNSPECIFIED    Sulfa Antibiotics Hives   Ibuprofen Other (See Comments)    Due to gastric surgery   Oxycodone-Acetaminophen Other (See Comments)    Causes Headaches, Dizziness    Triamcinolone Other (See Comments)    Pt is unsure of reaction    Percocet [Oxycodone-Acetaminophen] Other (See Comments)    PT STATES THAT SHE HAS HEADACHES WITH THIS MED    Social History   Tobacco Use   Smoking status: Never   Smokeless tobacco: Never  Substance Use Topics   Alcohol use: Not Currently    Family History  Problem Relation Age of Onset   Cancer Mother    Hypertension Mother    Thyroid disease Mother    Diabetes Father    Hypertension Father    Hyperlipidemia Father    Thyroid disease Father    Asthma Father    Cancer Sister        CERVICAL   Thyroid disease Sister    Asthma Sister    Asthma Brother    Breast cancer Maternal Grandmother        in her 74s     Review of Systems  Musculoskeletal:  Positive for arthralgias, gait problem and joint swelling.    Objective:  Physical Exam Constitutional:      Appearance: Normal appearance.  HENT:     Head:  Normocephalic and atraumatic.     Nose: Nose normal.     Mouth/Throat:     Mouth: Mucous membranes are moist.     Pharynx: Oropharynx is clear.  Eyes:     Conjunctiva/sclera: Conjunctivae normal.  Cardiovascular:     Rate and Rhythm: Normal rate and regular rhythm.     Pulses: Normal pulses.     Heart sounds: Normal heart sounds.  Pulmonary:     Effort: Pulmonary effort is normal.     Breath sounds: Normal breath  sounds.  Abdominal:     General: Abdomen is flat.     Palpations: Abdomen is soft.  Genitourinary:    Comments: deferred Musculoskeletal:     Cervical back: Normal range of motion and neck supple.     Comments: Examination of the left knee reveals healed arthroscopy portals. She has swelling, trace effusion. No warmth or erythema. Mild valgus deformity. Tenderness to palpation medial joint line, lateral joint line, peripatellar retinacular tissues with a positive grind sign. Her range of motion is 24 to 100 degrees without any ligamentous instability. No extensor lag. Painless range of motion of the hip  Distally, there is no focal motor or sensory deficit. She has palpable pedal pulses.  Skin:    General: Skin is warm and dry.     Capillary Refill: Capillary refill takes less than 2 seconds.  Neurological:     General: No focal deficit present.     Mental Status: She is alert and oriented to person, place, and time.  Psychiatric:        Mood and Affect: Mood normal.        Behavior: Behavior normal.        Thought Content: Thought content normal.        Judgment: Judgment normal.     Vital signs in last 24 hours: @VSRANGES @  Labs:   Estimated body mass index is 28.51 kg/m as calculated from the following:   Height as of 05/09/23: 5' (1.524 m).   Weight as of 05/09/23: 66.2 kg.   Imaging Review Plain radiographs demonstrate severe degenerative joint disease of the left knee(s). The overall alignment ismild valgus. The bone quality appears to be adequate for  age and reported activity level.      Assessment/Plan:  End stage arthritis, left knee   The patient history, physical examination, clinical judgment of the provider and imaging studies are consistent with end stage degenerative joint disease of the left knee(s) and total knee arthroplasty is deemed medically necessary. The treatment options including medical management, injection therapy arthroscopy and arthroplasty were discussed at length. The risks and benefits of total knee arthroplasty were presented and reviewed. The risks due to aseptic loosening, infection, stiffness, patella tracking problems, thromboembolic complications and other imponderables were discussed. The patient acknowledged the explanation, agreed to proceed with the plan and consent was signed. Patient is being admitted for inpatient treatment for surgery, pain control, PT, OT, prophylactic antibiotics, VTE prophylaxis, progressive ambulation and ADL's and discharge planning. The patient is planning to be discharged home with OOPT after an overnight stay.   Therapy Plans: outpatient therapy. PT at [facility] friendly today and need scheduled for 05/23/23.  Disposition: Home with Okey Regal, significant other. * Planned DVT Prophylaxis: aspirin 81mg  BID DME needed: walker.  PCP: Cleared.  Cardiology: Cleared.  TXA: IV Allergies:  - Oxycodone - headaches maybe dizziness.  - Sulfa antibiotics - hives - Shellfish - anaphylaxis.  - Peanuts - swelling.  - NSAIDs - gastric bypass.  Anesthesia Concerns: None.  BMI: 28.5 Last HgbA1c: 5.5 Other:* - Issues with orthostatic hypotension.  - History of gastric bypass. No NSAIDs.  - History of left knee arthroscopy.  - Oxycodone vs ?? due to tolerance, zofran. Has methocarbamol.  - 05/09/23: Hgb 13.8, no BMP.     Patient's anticipated LOS is less than 2 midnights, meeting these requirements: - Younger than 1 - Lives within 1 hour of care - Has a competent adult at home to recover  with post-op recover -  NO history of  - Chronic pain requiring opiods  - Diabetes  - Coronary Artery Disease  - Heart failure  - Heart attack  - Stroke  - DVT/VTE  - Cardiac arrhythmia  - Respiratory Failure/COPD  - Renal failure  - Anemia  - Advanced Liver disease

## 2023-05-20 ENCOUNTER — Encounter (HOSPITAL_COMMUNITY): Payer: Self-pay | Admitting: Orthopedic Surgery

## 2023-05-20 ENCOUNTER — Observation Stay (HOSPITAL_COMMUNITY): Payer: Medicaid Other

## 2023-05-20 DIAGNOSIS — M1712 Unilateral primary osteoarthritis, left knee: Secondary | ICD-10-CM | POA: Diagnosis not present

## 2023-05-20 MED ORDER — ACETAMINOPHEN 500 MG PO TABS: 1000.0000 mg | ORAL_TABLET | Freq: Three times a day (TID) | ORAL | 0 refills | Status: AC | PRN

## 2023-05-20 MED ORDER — SENNA 8.6 MG PO TABS: 2.0000 | ORAL_TABLET | Freq: Every day | ORAL | 0 refills | Status: AC

## 2023-05-20 MED ORDER — POLYETHYLENE GLYCOL 3350 17 G PO PACK: 17.0000 g | PACK | Freq: Every day | ORAL | 0 refills | Status: AC | PRN

## 2023-05-20 MED ORDER — ASPIRIN 81 MG PO CHEW: 81.0000 mg | CHEWABLE_TABLET | Freq: Two times a day (BID) | ORAL | 0 refills | Status: AC

## 2023-05-20 MED ORDER — METHOCARBAMOL 500 MG PO TABS: 500.0000 mg | ORAL_TABLET | Freq: Four times a day (QID) | ORAL | 0 refills | Status: AC | PRN

## 2023-05-20 MED ORDER — DOCUSATE SODIUM 100 MG PO CAPS: 100.0000 mg | ORAL_CAPSULE | Freq: Two times a day (BID) | ORAL | 0 refills | Status: AC

## 2023-05-20 MED ORDER — ONDANSETRON HCL 4 MG PO TABS: 4.0000 mg | ORAL_TABLET | Freq: Three times a day (TID) | ORAL | 0 refills | Status: AC | PRN

## 2023-05-20 MED ORDER — HYDROMORPHONE HCL 2 MG PO TABS: 1.0000 mg | ORAL_TABLET | ORAL | 0 refills | Status: AC | PRN

## 2023-05-20 NOTE — TOC Transition Note (Signed)
Transition of Care [facility]) - CM/SW Discharge Note   Patient Details  Name: Bridget Stevens JON MRN: [id number] Date of Birth: Sep 30, 1971  [facility]) CM/SW Contact:  Amada Jupiter, LCSW Phone Number: 05/20/2023, 10:02 AM   Clinical Narrative:     Met with pt and confirming need for youth RW - no DME agency preference - order placed with [employer] for delivery to room.  OPPT already arranged with [facility].  No further TOC needs.  Final next level of care: OP Rehab Barriers to Discharge: No Barriers Identified   Patient Goals and CMS Choice      Discharge Placement                         Discharge Plan and Services Additional resources added to the After Visit Summary for                  DME Arranged: Walker rolling ((youth)) DME Agency: [employer] Date DME Agency Contacted: 05/20/23 Time DME Agency Contacted: 1002 Representative spoke with at DME Agency: Loraine Leriche            Social Determinants of Health (SDOH) Interventions SDOH Screenings   Food Insecurity: No Food Insecurity (05/19/2023)  Housing: Low Risk  (05/19/2023)  Transportation Needs: No Transportation Needs (05/19/2023)  Utilities: Not At Risk (05/19/2023)  Financial Resource Strain: Medium Risk (11/19/2022)   Received from [facility], [facility]  Physical Activity: Unknown (11/19/2022)   Received from [facility], [facility]  Social Connections: Socially Integrated (11/19/2022)   Received from [facility], [facility]  Stress: No Stress Concern Present (11/19/2022)   Received from [facility], [facility]  Tobacco Use: Low Risk  (05/19/2023)     Readmission Risk Interventions     No data to display

## 2023-05-20 NOTE — Progress Notes (Signed)
Physical Therapy Treatment Patient Details Name: Bridget Stevens MRN: [id number] DOB: 11-Feb-1971 Today's Date: 05/20/2023   History of Present Illness Pt s/p L TKR and with hx of multiple back surgery    PT Comments  Pt cooperative but pain limited. Pt ambulated limited distance in hall and up to bathroom for toileting and hand hygiene.  Sitting EOB, pt leaned over and stated "I felt a pop in my knee and it hurt"  RN aware.    If plan is discharge home, recommend the following: A little help with walking and/or transfers;A little help with bathing/dressing/bathroom;Assistance with cooking/housework;Assist for transportation;Help with stairs or ramp for entrance   Can travel by private vehicle        Equipment Recommendations  Rolling walker (2 wheels)    Recommendations for Other Services       Precautions / Restrictions Precautions Precautions: Knee;Fall Restrictions Weight Bearing Restrictions: No LLE Weight Bearing: Weight bearing as tolerated     Mobility  Bed Mobility Overal bed mobility: Needs Assistance Bed Mobility: Sit to Supine     Supine to sit: Min assist Sit to supine: Min assist   General bed mobility comments: increased time with min assist for L LE management    Transfers Overall transfer level: Needs assistance Equipment used: Rolling walker (2 wheels) Transfers: Sit to/from Stand Sit to Stand: Min guard           General transfer comment: Steady assist with cues for LE management and use of UEs to self assist    Ambulation/Gait Ambulation/Gait assistance: Min assist, Min guard Gait Distance (Feet): 44 Feet (and 15' twice to/from bathroom) Assistive device: Rolling walker (2 wheels) Gait Pattern/deviations: Step-to pattern, Decreased step length - right, Decreased step length - left, Shuffle, Trunk flexed Gait velocity: decr     General Gait Details: cues for sequence, posture and position from RW; distance ltd by pain   Stairs              Wheelchair Mobility     Tilt Bed    Modified Rankin (Stroke Patients Only)       Balance Overall balance assessment: Needs assistance Sitting-balance support: No upper extremity supported, Feet supported Sitting balance-Leahy Scale: Good     Standing balance support: Single extremity supported Standing balance-Leahy Scale: Poor                              Cognition Arousal/Alertness: Awake/alert Behavior During Therapy: WFL for tasks assessed/performed Overall Cognitive Status: Within Functional Limits for tasks assessed                                          Exercises Total Joint Exercises Ankle Circles/Pumps: AROM, Both, 15 reps, Supine Quad Sets: AROM, Both, 10 reps, Supine Heel Slides: AAROM, Left, 15 reps, Supine Straight Leg Raises: AAROM, Left, 10 reps, Supine    General Comments        Pertinent Vitals/Pain Pain Assessment Pain Assessment: 0-10 Pain Score: 9  Pain Location: L knee Pain Descriptors / Indicators: Aching, Grimacing, Sore Pain Intervention(s): Limited activity within patient's tolerance, Monitored during session, Premedicated before session, Ice applied    Home Living Family/patient expects to be discharged to:: Private residence Living Arrangements: Children;Spouse/significant other Available Help at Discharge: Family Type of Home: House Home Access: Ramped entrance  Home Layout: One level Home Equipment: Toilet riser      Prior Function            PT Goals (current goals can now be found in the care plan section) Acute Rehab PT Goals Patient Stated Goal: regain IND PT Goal Formulation: With patient Time For Goal Achievement: 05/27/23 Potential to Achieve Goals: Good Progress towards PT goals: Progressing toward goals    Frequency    7X/week      PT Plan Current plan remains appropriate    Co-evaluation              AM-PAC PT "6 Clicks" Mobility   Outcome  Measure  Help needed turning from your back to your side while in a flat bed without using bedrails?: A Little Help needed moving from lying on your back to sitting on the side of a flat bed without using bedrails?: A Little Help needed moving to and from a bed to a chair (including a wheelchair)?: A Little Help needed standing up from a chair using your arms (e.g., wheelchair or bedside chair)?: A Little Help needed to walk in hospital room?: A Little Help needed climbing 3-5 steps with a railing? : A Lot 6 Click Score: 17    End of Session Equipment Utilized During Treatment: Gait belt Activity Tolerance: Patient tolerated treatment well;Patient limited by pain Patient left: in bed;with call bell/phone within reach;with bed alarm set Nurse Communication: Mobility status PT Visit Diagnosis: Unsteadiness on feet (R26.81);Difficulty in walking, not elsewhere classified (R26.2)     Time: 1610-9604 PT Time Calculation (min) (ACUTE ONLY): 31 min  Charges:    $Gait Training: 8-22 mins $Therapeutic Exercise: 8-22 mins $Therapeutic Activity: 8-22 mins PT General Charges $$ ACUTE PT VISIT: 1 Visit                     Mauro Kaufmann PT Acute Rehabilitation Services Pager [phone] Office [phone]    , 05/20/2023, 3:36 PM

## 2023-05-20 NOTE — Evaluation (Signed)
Physical Therapy Evaluation Patient Details Name: Bridget Stevens MRN: [id number] DOB: 05-Apr-1971 Today's Date: 05/20/2023  History of Present Illness  Pt s/p L TKR and with hx of multiple back surgery  Clinical Impression  Pt s/p L TKR and presents with decreased L LE strength/ROM and post op pain limiting functional mobility.  Pt should progress to dc home with family assist and reports follow up OP PT but unsure of date.      If plan is discharge home, recommend the following: A little help with walking and/or transfers;A little help with bathing/dressing/bathroom;Assistance with cooking/housework;Assist for transportation;Help with stairs or ramp for entrance   Can travel by private vehicle        Equipment Recommendations Rolling walker (2 wheels) (youth level please)  Recommendations for Other Services       Functional Status Assessment Patient has had a recent decline in their functional status and demonstrates the ability to make significant improvements in function in a reasonable and predictable amount of time.     Precautions / Restrictions Precautions Precautions: Knee;Fall Restrictions Weight Bearing Restrictions: No LLE Weight Bearing: Weight bearing as tolerated      Mobility  Bed Mobility Overal bed mobility: Needs Assistance Bed Mobility: Supine to Sit     Supine to sit: Min assist     General bed mobility comments: increased time with min assist for L LE management    Transfers Overall transfer level: Needs assistance Equipment used: Rolling walker (2 wheels) Transfers: Sit to/from Stand Sit to Stand: Min guard           General transfer comment: Steady assist with cues for LE management and use of UEs to self assist    Ambulation/Gait Ambulation/Gait assistance: Min assist, Min guard Gait Distance (Feet): 42 Feet Assistive device: Rolling walker (2 wheels) Gait Pattern/deviations: Step-to pattern, Decreased step length - right, Decreased  step length - left, Shuffle, Trunk flexed Gait velocity: decr     General Gait Details: cues for sequence, posture and position from RW; distance ltd by pain  Stairs            Wheelchair Mobility     Tilt Bed    Modified Rankin (Stroke Patients Only)       Balance Overall balance assessment: Needs assistance Sitting-balance support: No upper extremity supported, Feet supported Sitting balance-Leahy Scale: Good     Standing balance support: Bilateral upper extremity supported Standing balance-Leahy Scale: Poor                               Pertinent Vitals/Pain Pain Assessment Pain Assessment: 0-10 Pain Score: 8  Pain Location: L knee Pain Descriptors / Indicators: Aching, Grimacing, Sore Pain Intervention(s): Monitored during session, Limited activity within patient's tolerance, Premedicated before session, Ice applied    Home Living Family/patient expects to be discharged to:: Private residence Living Arrangements: Children;Spouse/significant other Available Help at Discharge: Family Type of Home: House Home Access: Ramped entrance       Home Layout: One level Home Equipment: Toilet riser      Prior Function Prior Level of Function : Independent/Modified Independent                     Hand Dominance   Dominant Hand: Right    Extremity/Trunk Assessment   Upper Extremity Assessment Upper Extremity Assessment: Overall WFL for tasks assessed    Lower Extremity Assessment Lower Extremity Assessment:  LLE deficits/detail LLE Deficits / Details: AAROM at knee -5 - 40 - pain limited    Cervical / Trunk Assessment Cervical / Trunk Assessment: Normal  Communication   Communication: No difficulties  Cognition Arousal/Alertness: Awake/alert Behavior During Therapy: WFL for tasks assessed/performed Overall Cognitive Status: Within Functional Limits for tasks assessed                                           General Comments      Exercises Total Joint Exercises Ankle Circles/Pumps: AROM, Both, 15 reps, Supine Quad Sets: AROM, Both, 10 reps, Supine Heel Slides: AAROM, Left, 15 reps, Supine Straight Leg Raises: AAROM, Left, 10 reps, Supine   Assessment/Plan    PT Assessment Patient needs continued PT services  PT Problem List Decreased strength;Decreased range of motion;Decreased activity tolerance;Decreased balance;Decreased knowledge of use of DME;Pain       PT Treatment Interventions DME instruction;Gait training;Stair training;Functional mobility training;Therapeutic activities;Therapeutic exercise;Patient/family education    PT Goals (Current goals can be found in the Care Plan section)  Acute Rehab PT Goals Patient Stated Goal: regain IND PT Goal Formulation: With patient Time For Goal Achievement: 05/27/23 Potential to Achieve Goals: Good    Frequency 7X/week     Co-evaluation               AM-PAC PT "6 Clicks" Mobility  Outcome Measure Help needed turning from your back to your side while in a flat bed without using bedrails?: A Little Help needed moving from lying on your back to sitting on the side of a flat bed without using bedrails?: A Little Help needed moving to and from a bed to a chair (including a wheelchair)?: A Little Help needed standing up from a chair using your arms (e.g., wheelchair or bedside chair)?: A Little Help needed to walk in hospital room?: A Little Help needed climbing 3-5 steps with a railing? : A Lot 6 Click Score: 17    End of Session Equipment Utilized During Treatment: Gait belt Activity Tolerance: Patient tolerated treatment well;Patient limited by pain Patient left: in chair;with call bell/phone within reach;with chair alarm set Nurse Communication: Mobility status PT Visit Diagnosis: Unsteadiness on feet (R26.81);Difficulty in walking, not elsewhere classified (R26.2)    Time: 1610-9604 PT Time Calculation (min) (ACUTE  ONLY): 31 min   Charges:   PT Evaluation $PT Eval Low Complexity: 1 Low PT Treatments $Therapeutic Exercise: 8-22 mins PT General Charges $$ ACUTE PT VISIT: 1 Visit         Mauro Kaufmann PT Acute Rehabilitation Services Pager [phone] Office [phone]   , 05/20/2023, 2:21 PM

## 2023-05-20 NOTE — Plan of Care (Signed)
  Problem: Education: Goal: Knowledge of the prescribed therapeutic regimen will improve Outcome: Progressing   Problem: Activity: Goal: Range of joint motion will improve Outcome: Progressing   Problem: Pain Management: Goal: Pain level will decrease with appropriate interventions Outcome: Progressing   Problem: Nutrition: Goal: Adequate nutrition will be maintained Outcome: Progressing

## 2023-05-20 NOTE — Progress Notes (Signed)
    Subjective:  Patient reports pain as mild to moderate.  Denies N/V/CP/SOB/Abd pain. She denies any tingling or numbness in LE bilaterally.   Objective:   VITALS:   Vitals:   05/20/23 0132 05/20/23 0600 05/20/23 0945 05/20/23 1047  BP: 117/62 (!) 106/56 (!) 115/57 113/65  Pulse: (!) 102 84 73 82  Resp: 16 15  16   Temp: 98.2 F (36.8 C) 97.8 F (36.6 C)  98 F (36.7 C)  TempSrc:    Oral  SpO2: 99% 100%  100%  Weight:      Height:        Patient is sitting up in bed. NAD.  Neurologically intact ABD soft Neurovascular intact Sensation intact distally Intact pulses distally Dorsiflexion/Plantar flexion intact Incision: dressing C/D/I No cellulitis present Compartment soft   Lab Results  Component Value Date   WBC 11.5 (H) 05/20/2023   HGB 11.0 (L) 05/20/2023   HCT 32.5 (L) 05/20/2023   MCV 95.0 05/20/2023   PLT 197 05/20/2023   BMET    Component Value Date/Time   NA 139 05/20/2023 0343   NA 144 05/04/2023 0940   K 4.2 05/20/2023 0343   CL 103 05/20/2023 0343   CO2 26 05/20/2023 0343   GLUCOSE 159 (H) 05/20/2023 0343   BUN 11 05/20/2023 0343   BUN 10 05/04/2023 0940   CREATININE 0.76 05/20/2023 0343   CREATININE 0.60 06/12/2021 0000   CALCIUM 9.0 05/20/2023 0343   EGFR 83 05/04/2023 0940   GFRNONAA >60 05/20/2023 0343     Assessment/Plan: 1 Day Post-Op   Principal Problem:   Osteoarthritis of left knee Active Problems:   S/P total knee arthroplasty, left   WBAT with walker DVT ppx: Aspirin, SCDs, TEDS PO pain control PT/OT Dispo:D/c home with OPPT once cleared with PT and has voided.    Clois Dupes, PA-C 05/20/2023, 1:46 PM   [facility]  [place] [address], [address], [place], [place] [address] Phone: [phone] [email or web address] Facebook  [employer]

## 2023-05-20 NOTE — Plan of Care (Signed)

## 2023-05-21 DIAGNOSIS — M1712 Unilateral primary osteoarthritis, left knee: Secondary | ICD-10-CM | POA: Diagnosis not present

## 2023-05-21 NOTE — Progress Notes (Signed)
   Subjective: 2 Days Post-Op Procedure(s) (LRB): COMPUTER ASSISTED TOTAL KNEE ARTHROPLASTY (Left)  Pt still c/o moderate pain in the knee and leg today Therapy as been difficult Denies any new symptoms/issues overnight Otherwise doing fair Patient reports pain as moderate.  Objective:   VITALS:   Vitals:   05/20/23 2047 05/21/23 0515  BP: 130/64 (!) 127/56  Pulse: 93 91  Resp: 18 17  Temp: 98.5 F (36.9 C) 98.3 F (36.8 C)  SpO2: 100% 100%    Left knee dressing intact Nv intact distally No rashes or edema Guarded rom No signs of infection or dvt   LABS Recent Labs    05/20/23 0343 05/21/23 0413  HGB 11.0* 10.6*  HCT 32.5* 32.0*  WBC 11.5* 9.5  PLT 197 165    Recent Labs    05/20/23 0343 05/21/23 0413  NA 139 138  K 4.2 3.8  BUN 11 12  CREATININE 0.76 0.67  GLUCOSE 159* 108*     Assessment/Plan: 2 Days Post-Op Procedure(s) (LRB): COMPUTER ASSISTED TOTAL KNEE ARTHROPLASTY (Left) Continue Pt/OT Pain management If passes physical therapy she may d/c home today, call me for discharge order Pulmonary toilet Will continue to monitor her progress     Alphonsa Overall PA-C, MPAS [facility] is now [facility] [address], [address], [place], [place] [address] Phone: [phone] [email or web address] Facebook  [employer]

## 2023-05-21 NOTE — Progress Notes (Signed)
Physical Therapy Treatment Patient Details Name: Bridget Stevens MRN: [id number] DOB: [birth year] Today's Date: 05/21/2023   History of Present Illness Pt s/p L TKR and with hx of multiple back surgery    PT Comments  Pt continues cooperative but progress with mobility limited by ongoing pain issues.  With ambulation, pt initially unable to place L foot on floor and hopping to compensate but with cues, able to normalize gait but with continued limited WB on L.    If plan is discharge home, recommend the following: A little help with walking and/or transfers;A little help with bathing/dressing/bathroom;Assistance with cooking/housework;Assist for transportation;Help with stairs or ramp for entrance   Can travel by private vehicle        Equipment Recommendations  Rolling walker (2 wheels)    Recommendations for Other Services       Precautions / Restrictions Precautions Precautions: Knee;Fall Restrictions Weight Bearing Restrictions: No LLE Weight Bearing: Weight bearing as tolerated     Mobility  Bed Mobility Overal bed mobility: Needs Assistance Bed Mobility: Supine to Sit     Supine to sit: Min guard     General bed mobility comments: for safety with L LE    Transfers Overall transfer level: Needs assistance Equipment used: Rolling walker (2 wheels) Transfers: Sit to/from Stand Sit to Stand: Min guard           General transfer comment: Steady assist with cues for LE management and use of UEs to self assist    Ambulation/Gait Ambulation/Gait assistance: Min guard Gait Distance (Feet): 54 Feet (and 10' into bathroom) Assistive device: Rolling walker (2 wheels) Gait Pattern/deviations: Step-to pattern, Decreased step length - right, Decreased step length - left, Shuffle, Trunk flexed Gait velocity: decr     General Gait Details: cues for sequence, posture and position from RW; distance ltd by pain   Stairs             Wheelchair Mobility      Tilt Bed    Modified Rankin (Stroke Patients Only)       Balance Overall balance assessment: Needs assistance Sitting-balance support: No upper extremity supported, Feet supported Sitting balance-Leahy Scale: Good     Standing balance support: Single extremity supported Standing balance-Leahy Scale: Poor                              Cognition Arousal/Alertness: Awake/alert Behavior During Therapy: WFL for tasks assessed/performed Overall Cognitive Status: Within Functional Limits for tasks assessed                                          Exercises Total Joint Exercises Ankle Circles/Pumps: AROM, Both, 15 reps, Supine Quad Sets: AROM, Both, 10 reps, Supine Heel Slides: AAROM, Left, 15 reps, Supine Straight Leg Raises: AAROM, Left, 10 reps, Supine    General Comments        Pertinent Vitals/Pain Pain Assessment Pain Assessment: 0-10 Pain Score: 8  Pain Location: L knee Pain Descriptors / Indicators: Aching, Grimacing, Sore Pain Intervention(s): Limited activity within patient's tolerance, Monitored during session, Premedicated before session    Home Living                          Prior Function  PT Goals (current goals can now be found in the care plan section) Acute Rehab PT Goals Patient Stated Goal: regain IND PT Goal Formulation: With patient Time For Goal Achievement: 05/27/23 Potential to Achieve Goals: Good Progress towards PT goals: Progressing toward goals    Frequency    7X/week      PT Plan Current plan remains appropriate    Co-evaluation              AM-PAC PT "6 Clicks" Mobility   Outcome Measure  Help needed turning from your back to your side while in a flat bed without using bedrails?: A Little Help needed moving from lying on your back to sitting on the side of a flat bed without using bedrails?: A Little Help needed moving to and from a bed to a chair (including a  wheelchair)?: A Little Help needed standing up from a chair using your arms (e.g., wheelchair or bedside chair)?: A Little Help needed to walk in hospital room?: A Little Help needed climbing 3-5 steps with a railing? : A Lot 6 Click Score: 17    End of Session Equipment Utilized During Treatment: Gait belt Activity Tolerance: Patient limited by pain Patient left: Other (comment) (in bathroom, CNA aware) Nurse Communication: Mobility status PT Visit Diagnosis: Unsteadiness on feet (R26.81);Difficulty in walking, not elsewhere classified (R26.2)     Time: 4782-9562 PT Time Calculation (min) (ACUTE ONLY): 33 min  Charges:    $Gait Training: 8-22 mins $Therapeutic Exercise: 8-22 mins PT General Charges $$ ACUTE PT VISIT: 1 Visit                     Mauro Kaufmann PT Acute Rehabilitation Services Pager [phone] Office [phone]    , 05/21/2023, 12:41 PM

## 2023-05-21 NOTE — Progress Notes (Signed)
Provided discharge education/instructions, all questions and concerns addressed. Pt not in any distress, discharged home with all of her belongings and RW accompanied by her daughter.

## 2023-05-21 NOTE — Plan of Care (Signed)

## 2023-05-21 NOTE — Discharge Summary (Signed)
In most cases prophylactic antibiotics for Dental procdeures after total joint surgery are not necessary.  Exceptions are as follows:  1. History of prior total joint infection  2. Severely immunocompromised (Organ Transplant, cancer chemotherapy, Rheumatoid biologic meds such as Humera)  3. Poorly controlled diabetes (A1C &gt; 8.0, blood glucose over 200)  If you have one of these conditions, contact your surgeon for an antibiotic prescription, prior to your dental procedure. Orthopedic Discharge Summary        Physician Discharge Summary  Patient ID: Bridget Stevens MRN: [id number] DOB/AGE: 1970/11/25 52 y.o.  Admit date: 05/19/2023 Discharge date: 05/21/2023   Procedures:  Procedure(s) (LRB): COMPUTER ASSISTED TOTAL KNEE ARTHROPLASTY (Left)  Attending Physician:  Dr. Linna Caprice  Admission Diagnoses:   left knee end stage ostearthritis  Discharge Diagnoses:  left knee end stage osteoarthritis   Past Medical History:  Diagnosis Date   Anemia    Anxiety    Arthritis    Asthma    GERD (gastroesophageal reflux disease)    Headache    Hepatomegaly    Hx of migraines    Hyperlipidemia    Ovarian cyst    Thyroid disease     PCP: Levonne Lapping, NP   Discharged Condition: good  Hospital Course:  Patient underwent the above stated procedure on 05/19/2023. Patient tolerated the procedure well and brought to the recovery room in good condition and subsequently to the floor. Patient had an uncomplicated hospital course and was stable for discharge.   Disposition: Discharge disposition: 01-Home or Self Care      with follow up in 2 weeks    Follow-up Information     Clois Dupes, PA-C. Schedule an appointment as soon as possible for a visit in 2 week(s).   Specialty: Orthopedic Surgery Why: For suture removal, For wound re-check Contact information: [address] [place] [address] [phone]                 Dental  Antibiotics:  In most cases prophylactic antibiotics for Dental procdeures after total joint surgery are not necessary.  Exceptions are as follows:  1. History of prior total joint infection  2. Severely immunocompromised (Organ Transplant, cancer chemotherapy, Rheumatoid biologic meds such as Humera)  3. Poorly controlled diabetes (A1C &gt; 8.0, blood glucose over 200)  If you have one of these conditions, contact your surgeon for an antibiotic prescription, prior to your dental procedure.  Discharge Instructions     Call MD / Call 911   Complete by: As directed    If you experience chest pain or shortness of breath, CALL 911 and be transported to the hospital emergency room.  If you develope a fever above 101 F, pus (white drainage) or increased drainage or redness at the wound, or calf pain, call your surgeon's office.   Call MD / Call 911   Complete by: As directed    If you experience chest pain or shortness of breath, CALL 911 and be transported to the hospital emergency room.  If you develope a fever above 101 F, pus (white drainage) or increased drainage or redness at the wound, or calf pain, call your surgeon's office.   Change dressing   Complete by: As directed    Do not remove your dressing.   Constipation Prevention   Complete by: As directed    Drink plenty of fluids.  Prune juice may be helpful.  You may use a stool softener, such as Colace (  over the counter) 100 mg twice a day.  Use MiraLax (over the counter) for constipation as needed.   Constipation Prevention   Complete by: As directed    Drink plenty of fluids.  Prune juice may be helpful.  You may use a stool softener, such as Colace (over the counter) 100 mg twice a day.  Use MiraLax (over the counter) for constipation as needed.   Diet - low sodium heart healthy   Complete by: As directed    Diet - low sodium heart healthy   Complete by: As directed    Discharge instructions   Complete by: As directed     Elevate toes above nose. Use cryotherapy as needed for pain and swelling.   Do not put a pillow under the knee. Place it under the heel.   Complete by: As directed    Driving restrictions   Complete by: As directed    No driving for 6 weeks   Increase activity slowly as tolerated   Complete by: As directed    Increase activity slowly as tolerated   Complete by: As directed    Lifting restrictions   Complete by: As directed    No lifting for 6 weeks   Post-operative opioid taper instructions:   Complete by: As directed    POST-OPERATIVE OPIOID TAPER INSTRUCTIONS: It is important to wean off of your opioid medication as soon as possible. If you do not need pain medication after your surgery it is ok to stop day one. Opioids include: Codeine, Hydrocodone(Norco, Vicodin), Oxycodone(Percocet, oxycontin) and hydromorphone amongst others.  Long term and even short term use of opiods can cause: Increased pain response Dependence Constipation Depression Respiratory depression And more.  Withdrawal symptoms can include Flu like symptoms Nausea, vomiting And more Techniques to manage these symptoms Hydrate well Eat regular healthy meals Stay active Use relaxation techniques(deep breathing, meditating, yoga) Do Not substitute Alcohol to help with tapering If you have been on opioids for less than two weeks and do not have pain than it is ok to stop all together.  Plan to wean off of opioids This plan should start within one week post op of your joint replacement. Maintain the same interval or time between taking each dose and first decrease the dose.  Cut the total daily intake of opioids by one tablet each day Next start to increase the time between doses. The last dose that should be eliminated is the evening dose.      Post-operative opioid taper instructions:   Complete by: As directed    POST-OPERATIVE OPIOID TAPER INSTRUCTIONS: It is important to wean off of your opioid  medication as soon as possible. If you do not need pain medication after your surgery it is ok to stop day one. Opioids include: Codeine, Hydrocodone(Norco, Vicodin), Oxycodone(Percocet, oxycontin) and hydromorphone amongst others.  Long term and even short term use of opiods can cause: Increased pain response Dependence Constipation Depression Respiratory depression And more.  Withdrawal symptoms can include Flu like symptoms Nausea, vomiting And more Techniques to manage these symptoms Hydrate well Eat regular healthy meals Stay active Use relaxation techniques(deep breathing, meditating, yoga) Do Not substitute Alcohol to help with tapering If you have been on opioids for less than two weeks and do not have pain than it is ok to stop all together.  Plan to wean off of opioids This plan should start within one week post op of your joint replacement. Maintain the same interval or time  between taking each dose and first decrease the dose.  Cut the total daily intake of opioids by one tablet each day Next start to increase the time between doses. The last dose that should be eliminated is the evening dose.      TED hose   Complete by: As directed    Use stockings (TED hose) for 2 weeks on both leg(s).  You may remove them at night for sleeping.   Weight bearing as tolerated   Complete by: As directed        Allergies as of 05/21/2023       Reactions   Shellfish Allergy Anaphylaxis   Peanut-containing Drug Products Swelling   SWELLING REACTION UNSPECIFIED    Sulfa Antibiotics Hives   Ibuprofen Other (See Comments)   Due to gastric surgery   Oxycodone-acetaminophen Other (See Comments)   Causes Headaches, Dizziness    Triamcinolone Other (See Comments)   Pt is unsure of reaction    Percocet [oxycodone-acetaminophen] Other (See Comments)   PT STATES THAT SHE HAS HEADACHES WITH THIS MED        Medication List     TAKE these medications    acetaminophen 500 MG  tablet Commonly known as: TYLENOL Take 2 tablets (1,000 mg total) by mouth every 8 (eight) hours as needed for moderate pain, fever or headache. What changed:  when to take this reasons to take this   albuterol 108 (90 Base) MCG/ACT inhaler Commonly known as: VENTOLIN HFA Inhale 2 puffs into the lungs every 4 (four) hours as needed for wheezing or shortness of breath.   aspirin 81 MG chewable tablet Commonly known as: Aspirin Childrens Chew 1 tablet (81 mg total) by mouth 2 (two) times daily with a meal.   CALCIUM CITRATE PO Take 600 mg by mouth daily.   cetirizine 10 MG tablet Commonly known as: ZYRTEC Take 10 mg by mouth daily.   docusate sodium 100 MG capsule Commonly known as: Colace Take 1 capsule (100 mg total) by mouth 2 (two) times daily.   EPINEPHrine 0.3 mg/0.3 mL Soaj injection Commonly known as: EPI-PEN Inject 0.3 mg into the muscle as needed for anaphylaxis.   famotidine 40 MG tablet Commonly known as: PEPCID Take 40 mg by mouth at bedtime.   Flarex 0.1 % ophthalmic suspension Generic drug: fluorometholone Apply 1 drop to eye 2 (two) times daily as needed (itching eyes).   gabapentin 300 MG capsule Commonly known as: NEURONTIN Take 300 mg by mouth 2 (two) times daily.   HYDROmorphone 2 MG tablet Commonly known as: Dilaudid Take 0.5 tablets (1 mg total) by mouth every 4 (four) hours as needed for up to 7 days for severe pain.   methocarbamol 500 MG tablet Commonly known as: ROBAXIN Take 500 mg by mouth at bedtime. What changed: Another medication with the same name was added. Make sure you understand how and when to take each.   methocarbamol 500 MG tablet Commonly known as: ROBAXIN Take 1 tablet (500 mg total) by mouth every 6 (six) hours as needed. What changed: You were already taking a medication with the same name, and this prescription was added. Make sure you understand how and when to take each.   multivitamin animal shapes (with Ca/FA) with  C & FA chewable tablet Chew 1 tablet by mouth daily.   naratriptan 2.5 MG tablet Commonly known as: AMERGE Take 1 tablet (2.5 mg total) by mouth as needed for migraine. Take one (1) tablet at onset  of headache; if returns or does not resolve, may repeat after 4 hours; do not exceed five (5) mg in 24 hours.   nortriptyline 25 MG capsule Commonly known as: PAMELOR Take 1 capsule (25 mg total) by mouth at bedtime.   omeprazole 40 MG capsule Commonly known as: PRILOSEC Take 40 mg by mouth daily.   ondansetron 4 MG tablet Commonly known as: Zofran Take 1 tablet (4 mg total) by mouth every 8 (eight) hours as needed for nausea or vomiting.   polyethylene glycol 17 g packet Commonly known as: MiraLax Take 17 g by mouth daily as needed for mild constipation or moderate constipation.   senna 8.6 MG Tabs tablet Commonly known as: SENOKOT Take 2 tablets (17.2 mg total) by mouth at bedtime for 15 days.   Vitamin D 50 MCG (2000 UT) Caps Take 2,000 Units by mouth daily.   Xiidra 5 % Soln Generic drug: Lifitegrast Place 1 drop into both eyes 2 (two) times daily as needed (dry eyes).               Discharge Care Instructions  (From admission, onward)           Start     Ordered   05/20/23 0000  Weight bearing as tolerated        05/20/23 1401   05/20/23 0000  Change dressing       Comments: Do not remove your dressing.   05/20/23 1401              Signed: Thea Gist 05/21/2023, 2:49 PM  [facility] is now [facility] [address], [address], [place], [place] [address] Phone: [phone] Facebook  Instagram  [employer]

## 2023-05-21 NOTE — Progress Notes (Signed)
Physical Therapy Treatment Patient Details Name: Bridget Stevens MRN: [id number] DOB: 04/24/1971 Today's Date: 05/21/2023   History of Present Illness Pt s/p L TKR and with hx of multiple back surgery    PT Comments  Pt reports pain marginally better and demonstrates ability to ambulate increased distance sufficient to get into home.  Written HEP provided and reviewed.  Pt eager to dc home this date.    If plan is discharge home, recommend the following: A little help with walking and/or transfers;A little help with bathing/dressing/bathroom;Assistance with cooking/housework;Assist for transportation;Help with stairs or ramp for entrance   Can travel by private vehicle        Equipment Recommendations  Rolling walker (2 wheels)    Recommendations for Other Services       Precautions / Restrictions Precautions Precautions: Knee;Fall Restrictions Weight Bearing Restrictions: No LLE Weight Bearing: Weight bearing as tolerated     Mobility  Bed Mobility Overal bed mobility: Needs Assistance Bed Mobility: Supine to Sit     Supine to sit: Min guard Sit to supine: Min assist   General bed mobility comments: for safety with L LE    Transfers Overall transfer level: Needs assistance Equipment used: Rolling walker (2 wheels) Transfers: Sit to/from Stand Sit to Stand: Min guard, Supervision           General transfer comment: min cues for use of UEs to self assist    Ambulation/Gait Ambulation/Gait assistance: Min guard, Supervision Gait Distance (Feet): 100 Feet Assistive device: Rolling walker (2 wheels) Gait Pattern/deviations: Step-to pattern, Decreased step length - right, Decreased step length - left, Shuffle, Trunk flexed Gait velocity: decr     General Gait Details: cues for sequence, posture and position from RW; distance ltd by pain; pt initially hopping but progressed to more normalized gait   Stairs             Wheelchair Mobility     Tilt  Bed    Modified Rankin (Stroke Patients Only)       Balance Overall balance assessment: Needs assistance Sitting-balance support: No upper extremity supported, Feet supported Sitting balance-Leahy Scale: Good     Standing balance support: Single extremity supported Standing balance-Leahy Scale: Poor                              Cognition Arousal/Alertness: Awake/alert Behavior During Therapy: WFL for tasks assessed/performed Overall Cognitive Status: Within Functional Limits for tasks assessed                                          Exercises Total Joint Exercises Ankle Circles/Pumps: AROM, Both, 15 reps, Supine Quad Sets: AROM, Both, 10 reps, Supine Heel Slides: AAROM, Left, 15 reps, Supine Straight Leg Raises: AAROM, Left, 10 reps, Supine    General Comments        Pertinent Vitals/Pain Pain Assessment Pain Assessment: 0-10 Pain Score: 6  Pain Location: L knee Pain Descriptors / Indicators: Aching, Grimacing, Sore Pain Intervention(s): Limited activity within patient's tolerance, Monitored during session, Premedicated before session, Ice applied    Home Living                          Prior Function            PT Goals (current goals  can now be found in the care plan section) Acute Rehab PT Goals Patient Stated Goal: regain IND PT Goal Formulation: With patient Time For Goal Achievement: 05/27/23 Potential to Achieve Goals: Good Progress towards PT goals: Progressing toward goals    Frequency    7X/week      PT Plan Current plan remains appropriate    Co-evaluation              AM-PAC PT "6 Clicks" Mobility   Outcome Measure  Help needed turning from your back to your side while in a flat bed without using bedrails?: A Little Help needed moving from lying on your back to sitting on the side of a flat bed without using bedrails?: A Little Help needed moving to and from a bed to a chair (including  a wheelchair)?: A Little Help needed standing up from a chair using your arms (e.g., wheelchair or bedside chair)?: A Little Help needed to walk in hospital room?: A Little Help needed climbing 3-5 steps with a railing? : A Lot 6 Click Score: 17    End of Session Equipment Utilized During Treatment: Gait belt Activity Tolerance: Patient tolerated treatment well Patient left: in chair Nurse Communication: Mobility status PT Visit Diagnosis: Unsteadiness on feet (R26.81);Difficulty in walking, not elsewhere classified (R26.2)     Time: 1914-7829 PT Time Calculation (min) (ACUTE ONLY): 33 min  Charges:    $Gait Training: 8-22 mins PT General Charges $$ ACUTE PT VISIT: 1 Visit                     Mauro Kaufmann PT Acute Rehabilitation Services Pager [phone] Office [phone]    , 05/21/2023, 5:11 PM

## 2023-06-02 ENCOUNTER — Ambulatory Visit: Payer: Medicaid Other | Admitting: Adult Health

## 2023-06-21 ENCOUNTER — Institutional Professional Consult (permissible substitution): Payer: Medicaid Other | Admitting: Psychiatry

## 2023-06-22 ENCOUNTER — Ambulatory Visit
Admission: RE | Admit: 2023-06-22 | Discharge: 2023-06-22 | Disposition: A | Discharge status: Home / Self Care | Payer: Medicaid Other | Source: Ambulatory Visit | Attending: Psychiatry | Admitting: Psychiatry

## 2023-06-22 DIAGNOSIS — R4781 Slurred speech: Secondary | ICD-10-CM

## 2023-06-22 DIAGNOSIS — R531 Weakness: Secondary | ICD-10-CM

## 2023-06-22 MED ORDER — GADOPICLENOL 0.5 MMOL/ML IV SOLN
7.0000 mL | Freq: Once | INTRAVENOUS | Status: AC | PRN
  Administered 2023-06-22: 7 mL via INTRAVENOUS

## 2023-06-23 ENCOUNTER — Other Ambulatory Visit: Payer: Self-pay | Admitting: Psychiatry

## 2023-06-23 DIAGNOSIS — R42 Dizziness and giddiness: Secondary | ICD-10-CM

## 2023-06-24 ENCOUNTER — Telehealth: Payer: Self-pay | Admitting: Psychiatry

## 2023-06-24 NOTE — Telephone Encounter (Signed)
[facility] [place]: [id number] exp. 06/24/23-08/22/23 sent to GI [phone]

## 2023-06-28 ENCOUNTER — Encounter: Payer: Self-pay | Admitting: Cardiology

## 2023-06-28 ENCOUNTER — Ambulatory Visit: Payer: Medicaid Other | Admitting: Cardiology

## 2023-06-28 VITALS — BP 114/62 | HR 77 | Resp 16 | Ht 60.0 in | Wt 144.0 lb

## 2023-06-28 DIAGNOSIS — I951 Orthostatic hypotension: Secondary | ICD-10-CM

## 2023-06-28 DIAGNOSIS — R55 Syncope and collapse: Secondary | ICD-10-CM

## 2023-06-28 NOTE — Progress Notes (Signed)
ID:  Vaughan Sine, DOB [birth year], MRN [id number]  PCP:  Levonne Lapping, NP  Cardiologist:  Tessa Lerner, DO, [facility] (established care 01/20/2023)  Date: 06/28/23 Last Office Visit: 04/04/2023  Chief Complaint  Patient presents with   history of orthostasis   Post-op Follow-up    HPI  Bridget Stevens is a 52 y.o. Caucasian female whose past medical history and cardiovascular risk factors include: Hx of gastric bypass, History of migraines, carpal tunnel release bilateral, neuropathy.  Referred to the practice for evaluation and management of orthostatic hypotension.  Since establishing care her orthostatic vital signs have at times been positive as well as  negative.  But she has been experiencing labile blood pressure readings at home.  She was encouraged to increase fluid intake and eating 3 balanced meals, wear compression stockings, change positions slowly.  Other contributing factors likely polypharmacy based on her prior medication list and/or malabsorption given her history of gastric bypass.    Since last office visit she has undergone knee replacement and has not had any postoperative complications.  No episodes of syncope.  She is also establish care with neurology.  Workup is ongoing according to the patient.  FUNCTIONAL STATUS: No structured exercise program or daily routine.   ALLERGIES: Allergies  Allergen Reactions   Shellfish Allergy Anaphylaxis   Peanut-Containing Drug Products Swelling    SWELLING REACTION UNSPECIFIED    Sulfa Antibiotics Hives   Ibuprofen Other (See Comments)    Due to gastric surgery   Oxycodone-Acetaminophen Other (See Comments)    Causes Headaches, Dizziness    Triamcinolone Other (See Comments)    Pt is unsure of reaction    Percocet [Oxycodone-Acetaminophen] Other (See Comments)    PT STATES THAT SHE HAS HEADACHES WITH THIS MED    MEDICATION LIST PRIOR TO VISIT: Current Meds  Medication Sig   acetaminophen (TYLENOL) 500 MG tablet  Take 2 tablets (1,000 mg total) by mouth every 8 (eight) hours as needed for moderate pain, fever or headache.   albuterol (PROVENTIL HFA;VENTOLIN HFA) 108 (90 Base) MCG/ACT inhaler Inhale 2 puffs into the lungs every 4 (four) hours as needed for wheezing or shortness of breath.    aspirin (ASPIRIN CHILDRENS) 81 MG chewable tablet Chew 1 tablet (81 mg total) by mouth 2 (two) times daily with a meal.   CALCIUM CITRATE PO Take 600 mg by mouth daily.   cetirizine (ZYRTEC) 10 MG tablet Take 10 mg by mouth daily.   Cholecalciferol (VITAMIN D) 50 MCG (2000 UT) CAPS Take 2,000 Units by mouth daily.   EPINEPHrine 0.3 mg/0.3 mL IJ SOAJ injection Inject 0.3 mg into the muscle as needed for anaphylaxis.   famotidine (PEPCID) 40 MG tablet Take 40 mg by mouth at bedtime.   FLAREX 0.1 % ophthalmic suspension Apply 1 drop to eye 2 (two) times daily as needed (itching eyes).   gabapentin (NEURONTIN) 300 MG capsule Take 300 mg by mouth 2 (two) times daily.   Lifitegrast (XIIDRA) 5 % SOLN Place 1 drop into both eyes 2 (two) times daily as needed (dry eyes).   methocarbamol (ROBAXIN) 500 MG tablet Take 1 tablet (500 mg total) by mouth every 6 (six) hours as needed.   naratriptan (AMERGE) 2.5 MG tablet Take 1 tablet (2.5 mg total) by mouth as needed for migraine. Take one (1) tablet at onset of headache; if returns or does not resolve, may repeat after 4 hours; do not exceed five (5) mg in 24 hours.  nortriptyline (PAMELOR) 25 MG capsule Take 1 capsule (25 mg total) by mouth at bedtime.   omeprazole (PRILOSEC) 40 MG capsule Take 40 mg by mouth daily.   ondansetron (ZOFRAN) 4 MG tablet Take 1 tablet (4 mg total) by mouth every 8 (eight) hours as needed for nausea or vomiting.   Pediatric Multiple Vit-C-FA (MULTIVITAMIN ANIMAL SHAPES, WITH CA/FA,) with C & FA chewable tablet Chew 1 tablet by mouth daily.     PAST MEDICAL HISTORY: Past Medical History:  Diagnosis Date   Anemia    Anxiety    Arthritis    Asthma     GERD (gastroesophageal reflux disease)    Headache    Hepatomegaly    Hx of migraines    Hyperlipidemia    Ovarian cyst    Thyroid disease     PAST SURGICAL HISTORY: Past Surgical History:  Procedure Laterality Date   ANTERIOR CERVICAL DECOMP/DISCECTOMY FUSION N/A 02/18/2020   Procedure: Anterior Cervical Decompression/Discectomy Fusion - Cervical five-Cervcal six;  Surgeon: Tia Alert, MD;  Location: [facility] OR;  Service: Neurosurgery;  Laterality: N/A;   BACK SURGERY     BTL  07/30/2008   CARPAL TUNNEL RELEASE Right    CARPAL TUNNEL RELEASE Left 09/22/2016   Procedure: CARPAL TUNNEL RELEASE, left;  Surgeon: Dairl Ponder, MD;  Location: [facility];  Service: Orthopedics;  Laterality: Left;   CESAREAN SECTION     CYST EXCISION Right 12/15/2022   DORSAL COMPARTMENT RELEASE Left 09/22/2016   Procedure: RELEASE DORSAL COMPARTMENT (DEQUERVAIN);  Surgeon: Dairl Ponder, MD;  Location: [facility];  Service: Orthopedics;  Laterality: Left;   ELBOW SURGERY Right    gastric by pass     HYSTEROSCOPY W/ ENDOMETRIAL ABLATION     KNEE ARTHROPLASTY Left 05/19/2023   Procedure: COMPUTER ASSISTED TOTAL KNEE ARTHROPLASTY;  Surgeon: Samson Frederic, MD;  Location: [facility] ORS;  Service: Orthopedics;  Laterality: Left;  160   KNEE SURGERY Left    meniscus tear and a spur   TUBAL LIGATION     WISDOM TOOTH EXTRACTION      FAMILY HISTORY: The patient family history includes Asthma in her brother, father, and sister; Breast cancer in her maternal grandmother; Cancer in her mother and sister; Diabetes in her father; Hyperlipidemia in her father; Hypertension in her father and mother; Thyroid disease in her father, mother, and sister.  SOCIAL HISTORY:  The patient  reports that she has never smoked. She has never used smokeless tobacco. She reports that she does not currently use alcohol. She reports that she does not use drugs.  REVIEW OF SYSTEMS: Review of Systems   Cardiovascular:  Positive for near-syncope. Negative for chest pain, claudication, dyspnea on exertion, irregular heartbeat, leg swelling, orthopnea, palpitations, paroxysmal nocturnal dyspnea and syncope.  Respiratory:  Negative for shortness of breath.   Hematologic/Lymphatic: Negative for bleeding problem.  Musculoskeletal:  Negative for muscle cramps and myalgias.  Neurological:  Positive for paresthesias (in the legs). Negative for dizziness and light-headedness.    PHYSICAL EXAM:    06/28/2023   10:26 AM 05/21/2023    1:06 PM 05/21/2023    5:15 AM  Vitals with BMI  Height 5\' 0"     Weight 144 lbs    BMI 28.12    Systolic 114 116 578  Diastolic 62 61 56  Pulse 77 109 91    Physical Exam  Constitutional: No distress.  Age appropriate, hemodynamically stable.   Neck: No JVD present.  Cardiovascular: Normal  rate, regular rhythm, S1 normal, S2 normal, intact distal pulses and normal pulses. Exam reveals no gallop, no S3 and no S4.  No murmur heard. Pulmonary/Chest: Effort normal and breath sounds normal. No stridor. She has no wheezes. She has no rales.  Abdominal: Soft. Bowel sounds are normal. She exhibits no distension. There is no abdominal tenderness.  Musculoskeletal:        General: No edema.     Cervical back: Neck supple.  Neurological: She is alert and oriented to person, place, and time. She has intact cranial nerves (2-12).  Skin: Skin is warm and moist.   CARDIAC DATABASE: EKG: January 20, 2023: Sinus rhythm, 66 bpm, normal axis, without underlying ischemia injury pattern.  02/25/2023: Sinus Rhythm, 72bpm, without underlying injury pattern, low voltage in precordial leads. No changes compared to 01/20/2023.   Echocardiogram: February 14, 2023: 1. Left ventricular ejection fraction, by estimation, is 55 to 60%. The left ventricle has normal function. The left ventricle has no regional wall motion abnormalities. Left ventricular diastolic parameters were normal. 2. Right  ventricular systolic function is normal. The right ventricular size is normal. There is normal pulmonary artery systolic pressure. The estimated right ventricular systolic pressure is 20.8 mmHg. 3. The mitral valve is normal in structure. Trivial mitral valve regurgitation. 4. The aortic valve is tricuspid. There is mild thickening of the aortic valve. Aortic valve regurgitation is not visualized. Aortic valve sclerosis is present, with no evidence of aortic valve stenosis. 5. The inferior vena cava is normal in size with greater than 50% respiratory variability, suggesting right atrial pressure of 3 mmHg.  Limited echocardiogram 02/24/2023: Average global longitudinal strain -18.1% (normal limits).      Stress Testing: Regadenoson Nuclear stress test 03/10/2023: There is a fixed mild defect in the septal region consistent with gut uptake artifact. No ischemia.  Overall LV systolic function is normal without regional wall motion abnormalities. Stress LV EF: 61%.  Nondiagnostic ECG stress. The heart rate response was consistent with Regadenoson.  No previous exam available for comparison. Low risk.   Cardiac monitor (Zio Patch): April 06, 2023-April 13, 2023 Dominant rhythm sinus, followed by tachycardia (burden 13%). Heart rate 50-200 bpm. Avg HR 85 bpm. No atrial fibrillation, ventricular tachycardia, high grade AV block, pauses (3 seconds or longer). Total ventricular ectopic burden <1%. Total supraventricular ectopic burden <1%. Couple brief episodes of PSVT (asymptomatic).  Patient triggered events: 23. Underlying rhythm sinus without ectopy.   Heart Catheterization: None  LABORATORY DATA:    Latest Ref Rng & Units 05/21/2023    4:13 AM 05/20/2023    3:43 AM 05/09/2023    9:30 AM  CBC  WBC 4.0 - 10.5 K/uL 9.5  11.5  5.4   Hemoglobin 12.0 - 15.0 g/dL 69.6  29.5  28.4   Hematocrit 36.0 - 46.0 % 32.0  32.5  42.3   Platelets 150 - 400 K/uL 165  197  230        Latest Ref Rng &  Units 05/21/2023    4:13 AM 05/20/2023    3:43 AM 05/04/2023    9:40 AM  CMP  Glucose 70 - 99 mg/dL 132  440  80   BUN 6 - 20 mg/dL 12  11  10    Creatinine 0.44 - 1.00 mg/dL 1.02  7.25  3.66   Sodium 135 - 145 mmol/L 138  139  144   Potassium 3.5 - 5.1 mmol/L 3.8  4.2  4.4   Chloride 98 -  111 mmol/L 103  103  99   CO2 22 - 32 mmol/L 27  26  29    Calcium 8.9 - 10.3 mg/dL 8.5  9.0  9.9   Total Protein 6.0 - 8.5 g/dL   7.1   Total Bilirubin 0.0 - 1.2 mg/dL   0.4   Alkaline Phos 44 - 121 IU/L   113   AST 0 - 40 IU/L   27   ALT 0 - 32 IU/L   24     Lipid Panel     Component Value Date/Time   CHOL 152 06/12/2021 0000   TRIG 85 06/12/2021 0000   HDL 56 06/12/2021 0000   CHOLHDL 2.7 06/12/2021 0000   LDLCALC 79 06/12/2021 0000    No components found for: "NTPROBNP" No results for input(s): "PROBNP" in the last 8760 hours. Recent Labs    05/04/23 0940  TSH 4.960*    BMP Recent Labs    05/04/23 0940 05/20/23 0343 05/21/23 0413  NA 144 139 138  K 4.4 4.2 3.8  CL 99 103 103  CO2 29 26 27   GLUCOSE 80 159* 108*  BUN 10 11 12   CREATININE 0.84 0.76 0.67  CALCIUM 9.9 9.0 8.5*  GFRNONAA  --  >60 >60     HEMOGLOBIN A1C No results found for: "HGBA1C", "MPG"  External Labs: Collected: 12/07/2022 provided by primary care A1c 5.5. BUN 11, creatinine 0.73. Sodium 142, potassium 4.2, chloride 101, bicarb 25. AST 29, ALT 25, alkaline phosphatase 96. Hemoglobin 13.5, hematocrit 38.5%. TSH 3.36   IMPRESSION:    ICD-10-CM   1. Near syncope  R55     2. Orthostatic hypotension  I95.1          RECOMMENDATIONS: Bridget Stevens is a 52 y.o. Caucasian female whose past medical history and cardiac risk factors include:  History of migraines, carpal tunnel release bilateral, hx of gastric bypass, neuropathy.  Patient has had a known history of near syncope/orthostasis.  During prior office visit her orthostatic vital signs have been positive at times but not always.  She  has been recommended to have at least 3 heart healthy meals, the importance of wearing compression stockings especially on the days when she is more symptomatic, keeping herself hydrated, changing positions slowly, and considering the role of polypharmacy (in the past on gabapentin, nortriptyline, naratriptan).  Since last office visit she has establish care with neurology and the workup is still pending per patient.  She has undergone appropriate cardiovascular workup and thereafter was provided preoperative risk stratification and she has successfully undergone knee replacement since last office visit.  For now no new additional cardiovascular testing or recommendations.  I will see her back on annual basis sooner if needed.  Patient is also advised that if she is doing well from a cardiac standpoint she can be seen on as-needed basis.  Patient is agreeable with the plan of care.  FINAL MEDICATION LIST END OF ENCOUNTER: No orders of the defined types were placed in this encounter.   Medications Discontinued During This Encounter  Medication Reason   methocarbamol (ROBAXIN) 500 MG tablet       Current Outpatient Medications:    acetaminophen (TYLENOL) 500 MG tablet, Take 2 tablets (1,000 mg total) by mouth every 8 (eight) hours as needed for moderate pain, fever or headache., Disp: 30 tablet, Rfl: 0   albuterol (PROVENTIL HFA;VENTOLIN HFA) 108 (90 Base) MCG/ACT inhaler, Inhale 2 puffs into the lungs every 4 (four) hours as  needed for wheezing or shortness of breath. , Disp: , Rfl:    aspirin (ASPIRIN CHILDRENS) 81 MG chewable tablet, Chew 1 tablet (81 mg total) by mouth 2 (two) times daily with a meal., Disp: 90 tablet, Rfl: 0   CALCIUM CITRATE PO, Take 600 mg by mouth daily., Disp: , Rfl:    cetirizine (ZYRTEC) 10 MG tablet, Take 10 mg by mouth daily., Disp: , Rfl:    Cholecalciferol (VITAMIN D) 50 MCG (2000 UT) CAPS, Take 2,000 Units by mouth daily., Disp: , Rfl:    EPINEPHrine 0.3  mg/0.3 mL IJ SOAJ injection, Inject 0.3 mg into the muscle as needed for anaphylaxis., Disp: , Rfl:    famotidine (PEPCID) 40 MG tablet, Take 40 mg by mouth at bedtime., Disp: , Rfl:    FLAREX 0.1 % ophthalmic suspension, Apply 1 drop to eye 2 (two) times daily as needed (itching eyes)., Disp: , Rfl:    gabapentin (NEURONTIN) 300 MG capsule, Take 300 mg by mouth 2 (two) times daily., Disp: , Rfl:    Lifitegrast (XIIDRA) 5 % SOLN, Place 1 drop into both eyes 2 (two) times daily as needed (dry eyes)., Disp: , Rfl:    methocarbamol (ROBAXIN) 500 MG tablet, Take 1 tablet (500 mg total) by mouth every 6 (six) hours as needed., Disp: 20 tablet, Rfl: 0   naratriptan (AMERGE) 2.5 MG tablet, Take 1 tablet (2.5 mg total) by mouth as needed for migraine. Take one (1) tablet at onset of headache; if returns or does not resolve, may repeat after 4 hours; do not exceed five (5) mg in 24 hours., Disp: 10 tablet, Rfl: 11   nortriptyline (PAMELOR) 25 MG capsule, Take 1 capsule (25 mg total) by mouth at bedtime., Disp: 30 capsule, Rfl: 5   omeprazole (PRILOSEC) 40 MG capsule, Take 40 mg by mouth daily., Disp: , Rfl:    ondansetron (ZOFRAN) 4 MG tablet, Take 1 tablet (4 mg total) by mouth every 8 (eight) hours as needed for nausea or vomiting., Disp: 30 tablet, Rfl: 0   Pediatric Multiple Vit-C-FA (MULTIVITAMIN ANIMAL SHAPES, WITH CA/FA,) with C & FA chewable tablet, Chew 1 tablet by mouth daily., Disp: , Rfl:   No orders of the defined types were placed in this encounter.   There are no Patient Instructions on file for this visit.   --Continue cardiac medications as reconciled in final medication list. --Return in about 1 year (around 06/27/2024) for Annual follow up visit history of orthostasis and near syncope.. or sooner if needed. --Continue follow-up with your primary care physician regarding the management of your other chronic comorbid conditions.  Patient's questions and concerns were addressed to her  satisfaction. She voices understanding of the instructions provided during this encounter.   This note was created using a voice recognition software as a result there may be grammatical errors inadvertently enclosed that do not reflect the nature of this encounter. Every attempt is made to correct such errors.  Tessa Lerner, [place], [facility]  Pager:  [phone] Office: [phone]

## 2023-07-01 ENCOUNTER — Other Ambulatory Visit: Payer: Self-pay | Admitting: Physician Assistant

## 2023-07-01 DIAGNOSIS — Z1231 Encounter for screening mammogram for malignant neoplasm of breast: Secondary | ICD-10-CM

## 2023-07-07 ENCOUNTER — Ambulatory Visit: Payer: Medicaid Other | Admitting: Cardiology

## 2023-07-07 ENCOUNTER — Inpatient Hospital Stay: Admission: RE | Admit: 2023-07-07 | Payer: Medicaid Other | Source: Ambulatory Visit

## 2023-07-08 ENCOUNTER — Other Ambulatory Visit: Payer: Self-pay | Admitting: Psychiatry

## 2023-07-08 ENCOUNTER — Ambulatory Visit
Admission: RE | Admit: 2023-07-08 | Discharge: 2023-07-08 | Disposition: A | Discharge status: Home / Self Care | Payer: Medicaid Other | Source: Ambulatory Visit | Attending: Psychiatry | Admitting: Psychiatry

## 2023-07-08 DIAGNOSIS — R42 Dizziness and giddiness: Secondary | ICD-10-CM

## 2023-07-08 MED ORDER — IOPAMIDOL (ISOVUE-370) INJECTION 76%
75.0000 mL | Freq: Once | INTRAVENOUS | Status: AC | PRN
  Administered 2023-07-08: 75 mL via INTRAVENOUS

## 2023-07-13 ENCOUNTER — Other Ambulatory Visit: Payer: Medicaid Other

## 2023-07-25 ENCOUNTER — Telehealth: Payer: Self-pay | Admitting: Psychiatry

## 2023-07-25 NOTE — Telephone Encounter (Signed)
Bridget Stevens, can you see if we can get copy of results for work in to review? Thank you   Reviewed chart. CT completed on 07/08/23. Does not appear results available yet.

## 2023-07-25 NOTE — Telephone Encounter (Signed)
Aime, RN Care manager with [facility] is asking pt be called with results to CT scan

## 2023-07-26 NOTE — Telephone Encounter (Signed)
Pt is asking if she can either be called with results to CT scan or if they can be resubmitted to my chart so she is able to view them.

## 2023-07-26 NOTE — Telephone Encounter (Signed)
Stanton Kidney- can you follow back up? No report received yet

## 2023-07-27 NOTE — Telephone Encounter (Signed)
Received result. Sent to Dr. Teresa Coombs to review since he is work in this afternoon

## 2023-07-27 NOTE — Telephone Encounter (Signed)
Pt informed with Ct scan results, documented on result note.

## 2023-07-27 NOTE — Progress Notes (Signed)
Please call and inform patient that recent CTA Head and Neck was normal, no evidence of stenosis.

## 2023-07-28 ENCOUNTER — Encounter: Payer: Self-pay | Admitting: Adult Health

## 2023-07-28 NOTE — Telephone Encounter (Signed)
No additional recommendation. Patient will need to be evaluated.

## 2023-08-08 ENCOUNTER — Ambulatory Visit
Admission: RE | Admit: 2023-08-08 | Discharge: 2023-08-08 | Disposition: A | Discharge status: Home / Self Care | Payer: Medicaid Other | Source: Ambulatory Visit | Attending: Physician Assistant | Admitting: Physician Assistant

## 2023-08-08 DIAGNOSIS — Z1231 Encounter for screening mammogram for malignant neoplasm of breast: Secondary | ICD-10-CM

## 2023-08-22 ENCOUNTER — Other Ambulatory Visit: Payer: Self-pay | Admitting: Registered Nurse

## 2023-08-22 DIAGNOSIS — Z780 Asymptomatic menopausal state: Secondary | ICD-10-CM

## 2023-08-31 ENCOUNTER — Ambulatory Visit: Payer: Medicaid Other | Admitting: Adult Health

## 2023-08-31 ENCOUNTER — Encounter: Payer: Self-pay | Admitting: Adult Health

## 2023-08-31 VITALS — BP 102/69 | HR 79 | Ht 60.0 in | Wt 148.0 lb

## 2023-08-31 DIAGNOSIS — R251 Tremor, unspecified: Secondary | ICD-10-CM

## 2023-08-31 DIAGNOSIS — R55 Syncope and collapse: Secondary | ICD-10-CM

## 2023-08-31 DIAGNOSIS — G43101 Migraine with aura, not intractable, with status migrainosus: Secondary | ICD-10-CM | POA: Diagnosis not present

## 2023-08-31 DIAGNOSIS — R42 Dizziness and giddiness: Secondary | ICD-10-CM

## 2023-08-31 DIAGNOSIS — R4781 Slurred speech: Secondary | ICD-10-CM

## 2023-08-31 DIAGNOSIS — R531 Weakness: Secondary | ICD-10-CM | POA: Diagnosis not present

## 2023-08-31 NOTE — Patient Instructions (Addendum)
Your Plan:  Decrease nortriptyline back to 10mg  nightly to see if this helps with your current symptoms, if symptoms improve but headaches worsen, please let me know and we can discuss other treatment options at that time. If no improvement of symptoms and migraines worsen, can return back to 25mg  tablets   Continue naratriptan as needed for migraine rescue  Follow up with your primary doctor regarding thyroid levels  If symptoms persist, can consider doing physical for balance/walking and speech therapy to help with your slurred speech    Follow up in 6 months or call earlier if needed     Thank you for coming to see [place] at [facility]. I hope we have been able to provide you high quality care today.  You may receive a patient satisfaction survey over the next few weeks. We would appreciate your feedback and comments so that we may continue to improve ourselves and the health of our patients.

## 2023-08-31 NOTE — Progress Notes (Signed)
Primary neurologist: Dr. Delena Bali   CC:  headaches Chief Complaint  Patient presents with   Follow-up    Patient in room #3 and alone. Patient states she still having migraine at least 4 a month that last for more than four hours.     History provided from self and daughter  Follow-up visit:  Prior visit: 05/04/2023 with Dr. Delena Bali   Brief HPI:   Bridget Stevens is a 52 y.o. female who is being followed for chronic migraine headaches.  Initially seen by Dr. Delena Bali 05/21/2022.  MRI brain normal.  At prior visit we Dr. Delena Bali, patient complained of 21-month onset of dizziness and slurred speech progressively worsening and episodes of bilateral arm and leg shaking typically lasting for 5 minutes, occurring 1-2 times per day.  Recommended further evaluation with MRI brain and C-spine and lab work.  MRI brain showed small vessel disease but otherwise unremarkable for contributing findings MRI C-spine postoperative vision changes of anterior cervical fusion of C5-6 and C6-7 and only mild degenerative changes at C4-5 and C7-T1 but without significant compression CTA head/neck unremarkable TSH elevated possibly contributing to fatigue and weakness, advised follow-up with PCP B12 and CMP WNL   Interval history:  Migraines overall stable currently having about 4-5 migraines per month, will resolve after naratriptan and laying down.  She continues on nortriptyline 25 mg nightly.  Her main concern today is in regards to continued dizziness with imbalance, slurred speech and intermittent bilateral arm and leg shaking.  She is frustrated regarding continued symptoms without clear cause.  No clear trigger for dizziness or extremity shaking.  Can occur while standing, walking and occasionally when sitting.  Extremity shaking occurs about every other day, duration can vary.  Does not lose consciousness and not associated with any other symptoms.  Denies any worsening of symptoms.   She did undergo  left knee replacement surgery back in August, still has some pain, completed therapies but continues HEP.  Also underwent right hand trigger finger release of 3 fingers, hand still wrapped, doing therapies currently.     Headache days per month: 4   Current Treatment: Abortive Naratriptan  Preventative Nortriptyline  Prior Therapies                                 Imitrex 50 mg PRN - lack of efficacy Rizatriptan -lack of efficacy Propranolol -hypotension Prozac Gabapentin  Nortriptyline    LABS: CBC    Component Value Date/Time   WBC 9.5 05/21/2023 0413   RBC 3.33 (L) 05/21/2023 0413   HGB 10.6 (L) 05/21/2023 0413   HCT 32.0 (L) 05/21/2023 0413   PLT 165 05/21/2023 0413   MCV 96.1 05/21/2023 0413   MCH 31.8 05/21/2023 0413   MCHC 33.1 05/21/2023 0413   RDW 13.2 05/21/2023 0413   LYMPHSABS 1.6 03/30/2022 0457   MONOABS 1.0 03/30/2022 0457   EOSABS 0.1 03/30/2022 0457   BASOSABS 0.0 03/30/2022 0457      Latest Ref Rng & Units 05/21/2023    4:13 AM 05/20/2023    3:43 AM 05/04/2023    9:40 AM  CMP  Glucose 70 - 99 mg/dL 161  096  80   BUN 6 - 20 mg/dL 12  11  10    Creatinine 0.44 - 1.00 mg/dL 0.45  4.09  8.11   Sodium 135 - 145 mmol/L 138  139  144   Potassium  3.5 - 5.1 mmol/L 3.8  4.2  4.4   Chloride 98 - 111 mmol/L 103  103  99   CO2 22 - 32 mmol/L 27  26  29    Calcium 8.9 - 10.3 mg/dL 8.5  9.0  9.9   Total Protein 6.0 - 8.5 g/dL   7.1   Total Bilirubin 0.0 - 1.2 mg/dL   0.4   Alkaline Phos 44 - 121 IU/L   113   AST 0 - 40 IU/L   27   ALT 0 - 32 IU/L   24       Current Outpatient Medications on File Prior to Visit  Medication Sig Dispense Refill   acetaminophen (TYLENOL) 500 MG tablet Take 2 tablets (1,000 mg total) by mouth every 8 (eight) hours as needed for moderate pain, fever or headache. 30 tablet 0   albuterol (PROVENTIL HFA;VENTOLIN HFA) 108 (90 Base) MCG/ACT inhaler Inhale 2 puffs into the lungs every 4 (four) hours as needed for wheezing or  shortness of breath.      CALCIUM CITRATE PO Take 600 mg by mouth daily.     cetirizine (ZYRTEC) 10 MG tablet Take 10 mg by mouth daily.     Cholecalciferol (VITAMIN D) 50 MCG (2000 UT) CAPS Take 2,000 Units by mouth daily.     EPINEPHrine 0.3 mg/0.3 mL IJ SOAJ injection Inject 0.3 mg into the muscle as needed for anaphylaxis.     famotidine (PEPCID) 40 MG tablet Take 40 mg by mouth at bedtime.     FLAREX 0.1 % ophthalmic suspension Apply 1 drop to eye 2 (two) times daily as needed (itching eyes).     gabapentin (NEURONTIN) 300 MG capsule Take 300 mg by mouth 2 (two) times daily.     Lifitegrast (XIIDRA) 5 % SOLN Place 1 drop into both eyes 2 (two) times daily as needed (dry eyes).     methocarbamol (ROBAXIN) 500 MG tablet Take 1 tablet (500 mg total) by mouth every 6 (six) hours as needed. 20 tablet 0   naratriptan (AMERGE) 2.5 MG tablet Take 1 tablet (2.5 mg total) by mouth as needed for migraine. Take one (1) tablet at onset of headache; if returns or does not resolve, may repeat after 4 hours; do not exceed five (5) mg in 24 hours. 10 tablet 11   nortriptyline (PAMELOR) 25 MG capsule Take 1 capsule (25 mg total) by mouth at bedtime. 30 capsule 5   omeprazole (PRILOSEC) 40 MG capsule Take 40 mg by mouth daily.     ondansetron (ZOFRAN) 4 MG tablet Take 1 tablet (4 mg total) by mouth every 8 (eight) hours as needed for nausea or vomiting. 30 tablet 0   Pediatric Multiple Vit-C-FA (MULTIVITAMIN ANIMAL SHAPES, WITH CA/FA,) with C & FA chewable tablet Chew 1 tablet by mouth daily.     SYMBICORT 80-4.5 MCG/ACT inhaler Inhale 1 puff into the lungs 2 (two) times daily.     No current facility-administered medications on file prior to visit.     Allergies: Allergies  Allergen Reactions   Shellfish Allergy Anaphylaxis   Peanut-Containing Drug Products Swelling    SWELLING REACTION UNSPECIFIED    Sulfa Antibiotics Hives   Ibuprofen Other (See Comments)    Due to gastric surgery    Oxycodone-Acetaminophen Other (See Comments)    Causes Headaches, Dizziness    Triamcinolone Other (See Comments)    Pt is unsure of reaction    Percocet [Oxycodone-Acetaminophen] Other (See Comments)  PT STATES THAT SHE HAS HEADACHES WITH THIS MED    Family History: Migraine or other headaches in the family:  sister Aneurysms in a first degree relative:  no Brain tumors in the family:  no Other neurological illness in the family:   no  Past Medical History: Past Medical History:  Diagnosis Date   Anemia    Anxiety    Arthritis    Asthma    GERD (gastroesophageal reflux disease)    Headache    Hepatomegaly    Hx of migraines    Hyperlipidemia    Ovarian cyst    Thyroid disease     Past Surgical History Past Surgical History:  Procedure Laterality Date   ABDOMINAL HYSTERECTOMY     ANTERIOR CERVICAL DECOMP/DISCECTOMY FUSION N/A 02/18/2020   Procedure: Anterior Cervical Decompression/Discectomy Fusion - Cervical five-Cervcal six;  Surgeon: Tia Alert, MD;  Location: [facility];  Service: Neurosurgery;  Laterality: N/A;   BACK SURGERY     BTL  07/30/2008   CARPAL TUNNEL RELEASE Right    CARPAL TUNNEL RELEASE Left 09/22/2016   Procedure: CARPAL TUNNEL RELEASE, left;  Surgeon: Dairl Ponder, MD;  Location: [facility];  Service: Orthopedics;  Laterality: Left;   CESAREAN SECTION     CYST EXCISION Right 12/15/2022   DORSAL COMPARTMENT RELEASE Left 09/22/2016   Procedure: RELEASE DORSAL COMPARTMENT (DEQUERVAIN);  Surgeon: Dairl Ponder, MD;  Location: [facility];  Service: Orthopedics;  Laterality: Left;   ELBOW SURGERY Right    gastric by pass     HYSTEROSCOPY W/ ENDOMETRIAL ABLATION     KNEE ARTHROPLASTY Left 05/19/2023   Procedure: COMPUTER ASSISTED TOTAL KNEE ARTHROPLASTY;  Surgeon: Samson Frederic, MD;  Location: [facility];  Service: Orthopedics;  Laterality: Left;  160   KNEE SURGERY Left    meniscus tear and a spur   TUBAL LIGATION      WISDOM TOOTH EXTRACTION      Social History: Social History   Tobacco Use   Smoking status: Never   Smokeless tobacco: Never  Vaping Use   Vaping status: Never Used  Substance Use Topics   Alcohol use: Not Currently   Drug use: No    ROS: Negative for fevers, chills. Positive for headaches. All other systems reviewed and negative unless stated otherwise in HPI.   Physical Exam:   Vital Signs: BP 102/69 (BP Location: Left Arm, Patient Position: Sitting, Cuff Size: Small)   Pulse 79   Ht 5' (1.524 m)   Wt 148 lb (67.1 kg)   LMP 05/03/2012   BMI 28.90 kg/m  GENERAL: well appearing pleasant middle-age Caucasian female,in no acute distress,alert SKIN:  Color, texture, turgor normal. No rashes or lesions HEAD:  Normocephalic/atraumatic. CV:  RRR RESP: Normal respiratory effort  NEUROLOGICAL: Mental Status: Alert, oriented to person, place and time,Follows commands.  Unable to appreciate aphasia or dysarthria. Cranial Nerves: PERRL, visual fields intact to confrontation, extraocular movements intact, facial sensation intact, no facial droop or ptosis, hearing grossly intact Motor: muscle strength 5/5 although limited LLE testing due to recent knee replacement and unable to test right hand grip strength and dexterity due to recent trigger finger release surgery.  No evidence of action or resting tremor in upper or lower extremities, no bradykinesia or rigidity Reflexes: 2+ throughout Sensation: intact to light touch all 4 extremities Coordination: Finger-to- nose-finger intact bilaterally Gait: normal-based     IMPRESSION: 52 year old female with a history of cervical spondylosis s/p C5-6  fusion, asthma, anxiety, migraines who presents for follow-up of migraine headaches.  She was previously seen by Dr. Delena Bali in July for 13-month onset of intermittent upper and lower extremity shaking, generalized weakness, slurred speech, imbalance, and dizziness.  Extensive workup  including MRI brain, C-spine and CTA head/neck unremarkable.  Lab work unremarkable except slightly elevated TSH.  Migraines initially improved on nortriptyline although still 12/month, dosage increased back in February to 25mg  nightly, currently having about 4-5 per month. Question possible side effect of nortriptyline as multiple symptom onset about 2 months after dosage adjustment.    PLAN: -Preventive: Recommend decreasing nortriptyline to 10 mg nightly. If symptoms persist but migraines worsen after about 2-3 weeks, would recommend returning back to 25mg  nightly. If symptoms improve but migraines worsen, advised to call to discuss other treatment options such as CRGP -Rescue: Continue naratriptan 2.5 mg as needed  -Counseled on limiting rescue medication use to 2 days per week to avoid rebound headaches -No further workup indicated from our standpoint for multitude of symptoms as noted above, she was encouraged to follow-up with PCP regarding elevated TSH as this may be contributing some.  Discussed evaluation with PT and SLP but declines interest at this time. Question underlying psych component contributing, consider BH evaluation but will defer to PCP    Follow-up in 6 months or call earlier if needed    I spent 40 minutes of face-to-face and non-face-to-face time with patient.  This included previsit chart review, lab review, study review, electronic health record documentation, patient education and discussion regarding migraine headaches and treatment plan and answered all other questions to patient satisfaction  Ihor Austin, [facility]  [facility] [address] [place], [place] [address]  Phone [phone] Fax [phone] Note: This document was prepared with digital dictation and possible smart phrase technology. Any transcriptional errors that result from this process are unintentional.

## 2023-09-09 ENCOUNTER — Other Ambulatory Visit: Payer: Self-pay | Admitting: Adult Health

## 2023-09-12 ENCOUNTER — Ambulatory Visit: Payer: Medicaid Other | Admitting: Adult Health

## 2023-09-12 NOTE — Telephone Encounter (Signed)
Rx refilled per last office visit note, "-Rescue: Continue naratriptan 2.5 mg as needed"

## 2023-09-21 ENCOUNTER — Ambulatory Visit: Payer: Medicaid Other | Admitting: Adult Health

## 2023-11-02 ENCOUNTER — Emergency Department (HOSPITAL_COMMUNITY): Payer: Medicaid Other

## 2023-11-02 ENCOUNTER — Emergency Department (HOSPITAL_COMMUNITY)
Admission: EM | Admit: 2023-11-02 | Discharge: 2023-11-02 | Discharge status: Against Medical Advice | Payer: Medicaid Other | Attending: Emergency Medicine | Admitting: Emergency Medicine

## 2023-11-02 ENCOUNTER — Other Ambulatory Visit: Payer: Self-pay

## 2023-11-02 ENCOUNTER — Encounter (HOSPITAL_COMMUNITY): Payer: Self-pay

## 2023-11-02 DIAGNOSIS — R0600 Dyspnea, unspecified: Secondary | ICD-10-CM | POA: Insufficient documentation

## 2023-11-02 DIAGNOSIS — R079 Chest pain, unspecified: Secondary | ICD-10-CM | POA: Insufficient documentation

## 2023-11-02 DIAGNOSIS — Z5321 Procedure and treatment not carried out due to patient leaving prior to being seen by health care provider: Secondary | ICD-10-CM | POA: Insufficient documentation

## 2023-11-02 LAB — BASIC METABOLIC PANEL
Anion gap: 10 (ref 5–15)
BUN: 10 mg/dL (ref 6–20)
CO2: 28 mmol/L (ref 22–32)
Calcium: 9.2 mg/dL (ref 8.9–10.3)
Chloride: 104 mmol/L (ref 98–111)
Creatinine, Ser: 0.65 mg/dL (ref 0.44–1.00)
GFR, Estimated: >60 mL/min (ref >60)
Glucose, Bld: 108 mg/dL — ABNORMAL HIGH (ref 70–99)
Potassium: 3.9 mmol/L (ref 3.5–5.1)
Sodium: 142 mmol/L (ref 135–145)

## 2023-11-02 LAB — CBC WITH DIFFERENTIAL/PLATELET
Abs Immature Granulocytes: 0.01 10*3/uL (ref 0.00–0.07)
Basophils Absolute: 0 10*3/uL (ref 0.0–0.1)
Basophils Relative: 1 %
Eosinophils Absolute: 0.1 10*3/uL (ref 0.0–0.5)
Eosinophils Relative: 1 %
HCT: 39.5 % (ref 36.0–46.0)
Hemoglobin: 12.6 g/dL (ref 12.0–15.0)
Immature Granulocytes: 0 %
Lymphocytes Relative: 31 %
Lymphs Abs: 1.9 10*3/uL (ref 0.7–4.0)
MCH: 30.1 pg (ref 26.0–34.0)
MCHC: 31.9 g/dL (ref 30.0–36.0)
MCV: 94.5 fL (ref 80.0–100.0)
Monocytes Absolute: 0.5 10*3/uL (ref 0.1–1.0)
Monocytes Relative: 9 %
Neutro Abs: 3.5 10*3/uL (ref 1.7–7.7)
Neutrophils Relative %: 58 %
Platelets: 203 10*3/uL (ref 150–400)
RBC: 4.18 MIL/uL (ref 3.87–5.11)
RDW: 14 % (ref 11.5–15.5)
WBC: 6 10*3/uL (ref 4.0–10.5)
nRBC: 0 % (ref 0.0–0.2)

## 2023-11-02 LAB — TROPONIN I (HIGH SENSITIVITY): Troponin I (High Sensitivity): 3 ng/L (ref <18)

## 2023-11-02 NOTE — ED Triage Notes (Addendum)
 C/o left sided headache radiating into neck x 1 week. Sensitivity to light and sound.  Pt reports fall 1 week ago and hit left side of fall. Muscle relaxer and ibuprofen  w/o relief Denies blood thinner usage.  Hx of migraines.

## 2023-11-02 NOTE — ED Provider Triage Note (Cosign Needed)
 Emergency Medicine Provider Triage Evaluation Note  Bridget Stevens , a 53 y.o. female  was evaluated in triage.  Pt complains of a 2 day Hx of  R sided chest pain radiates to neck and subacute headache for 1 week. States that it is exacerbated w/ deep inspirations. 1 week Hx of a fall, hit on metal cup. Tdap given at incident. Used Symbicort and albuterol  today. Ambulate w/out difficulty.   No Hx of DVT, MI.  Hx of migraines.  Review of Systems  Positive: Dyspnea Negative: Fever, cough,  congestion, weakness, vision changes, abdominal pain, n/v/d, back pain, dysuria, constipation, LE edema.   Physical Exam  BP 132/78   Pulse 96   Temp 98.4 F (36.9 C) (Oral)   Resp 18   Ht 5' (1.524 m)   Wt 67 kg   LMP 05/03/2012   SpO2 99%   BMI 28.85 kg/m  Gen:   Awake, no distress   Resp:  Normal effort  MSK:   Moves extremities without difficulty  Other:    Medical Decision Making  Medically screening exam initiated at 12:33 PM.  Appropriate orders placed.  Bridget Stevens was informed that the remainder of the evaluation will be completed by another provider, this initial triage assessment does not replace that evaluation, and the importance of remaining in the ED until their evaluation is complete.     Hayes Lipps, [place] 11/02/23 1241

## 2023-12-14 ENCOUNTER — Other Ambulatory Visit: Payer: Self-pay | Admitting: Neurology

## 2023-12-14 NOTE — Telephone Encounter (Signed)
 Last seen on 08/31/23 Follow up scheduled on 03/22/24

## 2023-12-30 ENCOUNTER — Other Ambulatory Visit: Payer: Self-pay | Admitting: Student

## 2023-12-30 DIAGNOSIS — M542 Cervicalgia: Secondary | ICD-10-CM

## 2024-01-13 ENCOUNTER — Encounter: Payer: Self-pay | Admitting: Adult Health

## 2024-01-21 ENCOUNTER — Ambulatory Visit
Admission: RE | Admit: 2024-01-21 | Discharge: 2024-01-21 | Disposition: A | Discharge status: Home / Self Care | Source: Ambulatory Visit | Attending: Student | Admitting: Student

## 2024-01-21 DIAGNOSIS — M542 Cervicalgia: Secondary | ICD-10-CM

## 2024-02-01 ENCOUNTER — Encounter (HOSPITAL_COMMUNITY): Payer: Self-pay

## 2024-02-01 ENCOUNTER — Other Ambulatory Visit: Payer: Self-pay

## 2024-02-01 ENCOUNTER — Emergency Department (HOSPITAL_COMMUNITY)

## 2024-02-01 ENCOUNTER — Emergency Department (HOSPITAL_COMMUNITY)
Admission: EM | Admit: 2024-02-01 | Discharge: 2024-02-01 | Disposition: A | Discharge status: Home / Self Care | Attending: Emergency Medicine | Admitting: Emergency Medicine

## 2024-02-01 DIAGNOSIS — R1031 Right lower quadrant pain: Secondary | ICD-10-CM | POA: Diagnosis present

## 2024-02-01 DIAGNOSIS — J45909 Unspecified asthma, uncomplicated: Secondary | ICD-10-CM | POA: Insufficient documentation

## 2024-02-01 DIAGNOSIS — Z91010 Allergy to peanuts: Secondary | ICD-10-CM | POA: Diagnosis not present

## 2024-02-01 LAB — COMPREHENSIVE METABOLIC PANEL WITH GFR
ALT: 24 U/L (ref 0–44)
AST: 28 U/L (ref 15–41)
Albumin: 4.1 g/dL (ref 3.5–5.0)
Alkaline Phosphatase: 82 U/L (ref 38–126)
Anion gap: 9 (ref 5–15)
BUN: 7 mg/dL (ref 6–20)
CO2: 31 mmol/L (ref 22–32)
Calcium: 9.3 mg/dL (ref 8.9–10.3)
Chloride: 102 mmol/L (ref 98–111)
Creatinine, Ser: 0.69 mg/dL (ref 0.44–1.00)
GFR, Estimated: >60 mL/min (ref >60)
Glucose, Bld: 92 mg/dL (ref 70–99)
Potassium: 3.5 mmol/L (ref 3.5–5.1)
Sodium: 142 mmol/L (ref 135–145)
Total Bilirubin: 0.5 mg/dL (ref 0.0–1.2)
Total Protein: 7.2 g/dL (ref 6.5–8.1)

## 2024-02-01 LAB — URINALYSIS, ROUTINE W REFLEX MICROSCOPIC
Bilirubin Urine: NEGATIVE
Glucose, UA: NEGATIVE mg/dL
Hgb urine dipstick: NEGATIVE
Ketones, ur: NEGATIVE mg/dL
Leukocytes,Ua: NEGATIVE
Nitrite: NEGATIVE
Protein, ur: NEGATIVE mg/dL
Specific Gravity, Urine: 1.003 — ABNORMAL LOW (ref 1.005–1.030)
pH: 7 (ref 5.0–8.0)

## 2024-02-01 LAB — CBC
HCT: 41.5 % (ref 36.0–46.0)
Hemoglobin: 13.5 g/dL (ref 12.0–15.0)
MCH: 31.3 pg (ref 26.0–34.0)
MCHC: 32.5 g/dL (ref 30.0–36.0)
MCV: 96.3 fL (ref 80.0–100.0)
Platelets: 235 10*3/uL (ref 150–400)
RBC: 4.31 MIL/uL (ref 3.87–5.11)
RDW: 13.2 % (ref 11.5–15.5)
WBC: 6.5 10*3/uL (ref 4.0–10.5)
nRBC: 0 % (ref 0.0–0.2)

## 2024-02-01 LAB — LIPASE, BLOOD: Lipase: 42 U/L (ref 11–51)

## 2024-02-01 MED ORDER — IOHEXOL 300 MG/ML  SOLN
100.0000 mL | Freq: Once | INTRAMUSCULAR | Status: AC | PRN
  Administered 2024-02-01: 100 mL via INTRAVENOUS

## 2024-02-01 MED ORDER — ONDANSETRON HCL 4 MG/2ML IJ SOLN
4.0000 mg | Freq: Once | INTRAMUSCULAR | Status: AC
  Administered 2024-02-01: 4 mg via INTRAVENOUS
  Filled 2024-02-01: qty 2

## 2024-02-01 MED ORDER — MORPHINE SULFATE (PF) 4 MG/ML IV SOLN
4.0000 mg | Freq: Once | INTRAVENOUS | Status: AC
  Administered 2024-02-01: 4 mg via INTRAVENOUS
  Filled 2024-02-01: qty 1

## 2024-02-01 NOTE — ED Triage Notes (Signed)
 RLQ abdominal pain that started this evening with 1 episode of vomiting. No diarrhea.

## 2024-02-01 NOTE — ED Notes (Signed)
 Patient transported to CT

## 2024-02-01 NOTE — ED Notes (Signed)
 AVS provided by edp was reviewed with pt. Pt verbalized understanding with no additional questions at this time. Pt to go home with daughter at bedside

## 2024-02-01 NOTE — ED Provider Notes (Signed)
 [place] [facility]MENT AT [facility] Provider Note   CSN: [id number] Arrival date & time: 02/01/24  1817     History  Chief Complaint  Patient presents with   Abdominal Pain    Bridget MASELLI is a 53 y.o. female with extensive past medical history as listed below presents with abdominal pain that started earlier today.  Patient localizes her pain to the right lower abdomen.  Reports a history of hysterectomy and gastric bypass.  1 episode of emesis today.  No diarrhea.  No urinary or vaginal symptoms.   Abdominal Pain  Past Medical History:  Diagnosis Date   Anemia    Anxiety    Arthritis    Asthma    GERD (gastroesophageal reflux disease)    Headache    Hepatomegaly    Hx of migraines    Hyperlipidemia    Ovarian cyst    Thyroid  disease    Past Surgical History:  Procedure Laterality Date   ABDOMINAL HYSTERECTOMY     ANTERIOR CERVICAL DECOMP/DISCECTOMY FUSION N/A 02/18/2020   Procedure: Anterior Cervical Decompression/Discectomy Fusion - Cervical five-Cervcal six;  Surgeon: Isadora Mar, MD;  Location: [facility] OR;  Service: Neurosurgery;  Laterality: N/A;   BACK SURGERY     BTL  07/30/2008   CARPAL TUNNEL RELEASE Right    CARPAL TUNNEL RELEASE Left 09/22/2016   Procedure: CARPAL TUNNEL RELEASE, left;  Surgeon: Florida Hurter, MD;  Location: [facility];  Service: Orthopedics;  Laterality: Left;   CESAREAN SECTION     CYST EXCISION Right 12/15/2022   DORSAL COMPARTMENT RELEASE Left 09/22/2016   Procedure: RELEASE DORSAL COMPARTMENT (DEQUERVAIN);  Surgeon: Florida Hurter, MD;  Location:  SURGERY CENTER;  Service: Orthopedics;  Laterality: Left;   ELBOW SURGERY Right    gastric by pass     HYSTEROSCOPY W/ ENDOMETRIAL ABLATION     KNEE ARTHROPLASTY Left 05/19/2023   Procedure: COMPUTER ASSISTED TOTAL KNEE ARTHROPLASTY;  Surgeon: Adonica Hoose, MD;  Location: [facility] ORS;  Service: Orthopedics;  Laterality: Left;  160   KNEE  SURGERY Left    meniscus tear and a spur   TUBAL LIGATION     WISDOM TOOTH EXTRACTION         Home Medications Prior to Admission medications   Medication Sig Start Date End Date Taking? Authorizing Provider  acetaminophen  (TYLENOL ) 500 MG tablet Take 2 tablets (1,000 mg total) by mouth every 8 (eight) hours as needed for moderate pain, fever or headache. 05/20/23   Harman Lightning, PA-C  albuterol  (PROVENTIL  HFA;VENTOLIN  HFA) 108 (90 Base) MCG/ACT inhaler Inhale 2 puffs into the lungs every 4 (four) hours as needed for wheezing or shortness of breath.     [provider]  CALCIUM CITRATE PO Take 600 mg by mouth daily.    [provider]  cetirizine (ZYRTEC) 10 MG tablet Take 10 mg by mouth daily. 04/01/23   [provider]  Cholecalciferol (VITAMIN D ) 50 MCG (2000 UT) CAPS Take 2,000 Units by mouth daily.    [provider]  EPINEPHrine  0.3 mg/0.3 mL IJ SOAJ injection Inject 0.3 mg into the muscle as needed for anaphylaxis. 11/21/19   [provider]  famotidine  (PEPCID ) 40 MG tablet Take 40 mg by mouth at bedtime. 03/14/22   [provider]  FLAREX  0.1 % ophthalmic suspension Apply 1 drop to eye 2 (two) times daily as needed (itching eyes). 02/19/22   [provider]  gabapentin  (NEURONTIN ) 300 MG  capsule Take 300 mg by mouth 2 (two) times daily.    [provider]  Lifitegrast Clarke Crouch) 5 % SOLN Place 1 drop into both eyes 2 (two) times daily as needed (dry eyes).    [provider]  methocarbamol  (ROBAXIN ) 500 MG tablet Take 1 tablet (500 mg total) by mouth every 6 (six) hours as needed. 05/20/23   Hill, Philippa Bray, PA-C  naratriptan  (AMERGE) 2.5 MG tablet TAKE 1 TABLET (2.5 MG TOTAL) BY MOUTH AS NEEDED FOR MIGRAINE. TAKE ONE (1) TABLET AT ONSET OF HEADACHE IF RETURNS OR DOES NOT RESOLVE, MAY REPEAT AFTER 4 HOURS DO NOT EXCEED FIVE (5) MG IN 24 HOURS. 09/12/23   Johny Nap, NP  nortriptyline  (PAMELOR ) 25 MG capsule TAKE 1  CAPSULE BY MOUTH AT BEDTIME. 12/14/23   Johny Nap, NP  omeprazole (PRILOSEC) 40 MG capsule Take 40 mg by mouth daily. 02/19/22   [provider]  ondansetron  (ZOFRAN ) 4 MG tablet Take 1 tablet (4 mg total) by mouth every 8 (eight) hours as needed for nausea or vomiting. 05/20/23 05/19/24  Harman Lightning, PA-C  Pediatric Multiple Vit-C-FA (MULTIVITAMIN ANIMAL SHAPES, WITH CA/FA,) with C & FA chewable tablet Chew 1 tablet by mouth daily.    [provider]  SYMBICORT 80-4.5 MCG/ACT inhaler Inhale 1 puff into the lungs 2 (two) times daily. 08/22/23   [provider]      Allergies    Peanut-containing drug products, Shellfish allergy, Sulfa antibiotics, Ibuprofen , Oxycodone , Oxycodone -acetaminophen , Triamcinolone, and Percocet [oxycodone -acetaminophen ]    Review of Systems   Review of Systems  Gastrointestinal:  Positive for abdominal pain.    Physical Exam Updated Vital Signs BP 131/77 (BP Location: Right Arm)   Pulse 73   Temp 98.1 F (36.7 C) (Oral)   Resp 16   Ht 5' (1.524 m)   Wt 69.4 kg   LMP 05/03/2012   SpO2 100%   BMI 29.88 kg/m  Physical Exam Vitals and nursing note reviewed.  Constitutional:      General: She is not in acute distress.    Appearance: She is well-developed.  HENT:     Head: Normocephalic and atraumatic.  Eyes:     Conjunctiva/sclera: Conjunctivae normal.  Cardiovascular:     Rate and Rhythm: Normal rate and regular rhythm.     Heart sounds: No murmur heard. Pulmonary:     Effort: Pulmonary effort is normal. No respiratory distress.     Breath sounds: Normal breath sounds.  Abdominal:     Palpations: Abdomen is soft.     Tenderness: There is abdominal tenderness.     Comments: Right lower quadrant abdominal tenderness, with rebound, +rovsing, mild right upper quadrant tenderness  Musculoskeletal:        General: No swelling.     Cervical back: Neck supple.  Skin:    General: Skin is warm and dry.     Capillary Refill:  Capillary refill takes less than 2 seconds.  Neurological:     Mental Status: She is alert.  Psychiatric:        Mood and Affect: Mood normal.     ED Results / Procedures / Treatments   Labs (all labs ordered are listed, but only abnormal results are displayed) Labs Reviewed  URINALYSIS, ROUTINE W REFLEX MICROSCOPIC - Abnormal; Notable for the following components:      Result Value   Color, Urine STRAW (*)    Specific Gravity, Urine 1.003 (*)    All other components within  normal limits  LIPASE, BLOOD  COMPREHENSIVE METABOLIC PANEL WITH GFR  CBC    EKG None  Radiology CT ABDOMEN PELVIS W CONTRAST Result Date: 02/01/2024 CLINICAL DATA:  Right lower quadrant pain for several hours, initial encounter EXAM: CT ABDOMEN AND PELVIS WITH CONTRAST TECHNIQUE: Multidetector CT imaging of the abdomen and pelvis was performed using the standard protocol following bolus administration of intravenous contrast. RADIATION DOSE REDUCTION: This exam was performed according to the departmental dose-optimization program which includes automated exposure control, adjustment of the mA and/or kV according to patient size and/or use of iterative reconstruction technique. CONTRAST:  OMNIPAQUE  IOHEXOL  300 MG/ML  SOLN COMPARISON:  03/23/2022 FINDINGS: Lower chest: No acute abnormality. Hepatobiliary: No focal liver abnormality is seen. No gallstones, gallbladder wall thickening, or biliary dilatation. Pancreas: Unremarkable. No pancreatic ductal dilatation or surrounding inflammatory changes. Spleen: Normal in size without focal abnormality. Adrenals/Urinary Tract: Adrenal glands are within normal limits. Kidneys demonstrate a normal enhancement pattern. The right kidney is somewhat obscured by considerable scatter artifact from dense colonic contrast. No obstructive changes are seen. The bladder is well distended. Stomach/Bowel: No obstructive or inflammatory changes of the colon are noted. High density  barium is noted throughout the colon causing considerable artifact. Correlate clinically as to the origin of the contrast. Small bowel is within normal limits. Postsurgical changes in the stomach are noted consistent with prior bypass. Vascular/Lymphatic: Aortic atherosclerosis. No enlarged abdominal or pelvic lymph nodes. Reproductive: Uterus and bilateral adnexa are unremarkable. Other: No abdominal wall hernia or abnormality. No abdominopelvic ascites. Musculoskeletal: Postsurgical changes are noted in the lumbar spine. No acute bony abnormality is noted IMPRESSION: Dense contrast throughout the colon. Correlate clinically as to the origin of the contrast. No acute abnormality is identified to correspond with the given clinical history. Electronically Signed   By: Violeta Grey M.D.   On: 02/01/2024 21:44    Procedures Procedures    Medications Ordered in ED Medications  morphine  (PF) 4 MG/ML injection 4 mg (4 mg Intravenous Given 02/01/24 2059)  ondansetron  (ZOFRAN ) injection 4 mg (4 mg Intravenous Given 02/01/24 2059)  iohexol  (OMNIPAQUE ) 300 MG/ML solution 100 mL (100 mLs Intravenous Contrast Given 02/01/24 2121)    ED Course/ Medical Decision Making/ A&P                                 Medical Decision Making Amount and/or Complexity of Data Reviewed Labs: ordered. Radiology: ordered.  Risk Prescription drug management.   This patient presents to the ED with chief complaint(s) of abdominal.  The complaint involves an extensive differential diagnosis and also carries with it a high risk of complications and morbidity.   pertinent past medical history as listed in HPI  The differential diagnosis includes  gastroenteritis, colitis, small bowel obstruction, appendicitis, cholecystitis, hepatobiliary pathology, gastritis, PUD, ACS, aortic dissection, diverticulosis/diverticulitis, pancreatitis, nephrolithiasis, UTI, pyelonephritis   Additional history obtained: Additional history  obtained from family Records reviewed Care Everywhere/External Records  Initial Assessment:   Hemodynamically stable, afebrile, nontoxic-appearing patient presenting with abdominal pain that started earlier today.  On exam patient has exquisite tenderness to right lower quadrant, positive Rovsing.  No urinary or vaginal symptoms.  Does report a history of hysterectomy and gastric bypass.  Lab work so far is without any acute findings.  Given exquisite tenderness will obtain CT imaging.  Independent ECG interpretation:  none  Independent labs interpretation:  The following labs were independently  interpreted:  CBC and CMP unremarkable, lipase within normal limits, UA without significant abnormality  Independent visualization and interpretation of imaging: I independently visualized the following imaging with scope of interpretation limited to determining acute life threatening conditions related to emergency care: CT abd pelvis, which revealed no acute abnormality visualized and severely limited scan  Large amount of contrast noted in the scan, patient had a KUB earlier today.  Discussed with patient that although there are no acute findings noted in the CT, scan was limited due to the [place] contrast.  Her vitals have remained stable, labs are without acute findings.  Patient is ready to go home.  Very strict return precautions.  Discharged home with close PCP follow-up.  Treatment and Reassessment: Patient given morphine  4 mg and Zofran  4 mg following first assessment  Consultations obtained:   none  Disposition:   Patient be discharged home. The patient has been appropriately medically screened and/or stabilized in the ED. I have low suspicion for any other emergent medical condition which would require further screening, evaluation or treatment in the ED or require inpatient management. At time of discharge the patient is hemodynamically stable and in no acute distress. I have discussed  work-up results and diagnosis with patient and answered all questions. Patient is agreeable with discharge plan. We discussed strict return precautions for returning to the emergency department and they verbalized understanding.     Social Determinants of Health:   none  This note was dictated with voice recognition software.  Despite best efforts at proofreading, errors may have occurred which can change the documentation meaning.          Final Clinical Impression(s) / ED Diagnoses Final diagnoses:  Right lower quadrant abdominal pain    Rx / DC Orders ED Discharge Orders     None         Felicie Horning, PA-C 02/01/24 2217    Sallyanne Creamer, DO 02/05/24 1335

## 2024-02-01 NOTE — ED Notes (Signed)
 Pt called husband to pick her up since she was informed she could not drive after 2 doses of morphine.

## 2024-02-01 NOTE — Discharge Instructions (Signed)
 Your evaluated in the emergency room for abdominal pain.  Your lab work and imaging did not show any significant abnormality.  You may alternate Tylenol and ibuprofen as needed for pain.  If you experience any new or worsening symptoms including fevers, chills, persistent vomiting and worsening pain please return to the emergency room.

## 2024-03-15 ENCOUNTER — Telehealth: Payer: Self-pay | Admitting: Gastroenterology

## 2024-03-15 NOTE — Telephone Encounter (Signed)
 Good morning Dr. Karene Oto  Doc of Day AM   We received a call from this patient wishing to schedule an appointment for GERD. Patient was last seen with [facility] in 01/2024. She states she would now like a provider in [place] and not in [place]. Records from office visits are in Care Everywhere for you to review. Would you please advise on scheduling?  Thank you.

## 2024-03-21 NOTE — Progress Notes (Unsigned)
 Primary neurologist: Dr. Billy Bue   CC:  headaches No chief complaint on file.    History provided from self and daughter  Follow-up visit:  Prior visit: 08/31/2023   Brief HPI:   Bridget Stevens is a 53 y.o. female who is being followed for chronic migraine headaches.  Initially seen by Dr. Billy Bue 05/21/2022.  MRI brain normal. C/o onset of shakiness and slurred speech around 01/2023 and episodes of bilateral arm and leg shaking with extensive workup largely unremarkable including MRI brain, CTA head/neck and MRI C-spine except elevated TSH.   At prior visit, decreased nortriptyline  to 10 mg nightly to see if persistent symptoms including dizziness, slurred speech, bilateral arm and leg shaking improved as symptoms started shortly after increasing dosage.  She continued on naratriptan  for migraine rescue.      Interval history:  Migraines overall stable currently having about 4-5 migraines per month, will resolve after naratriptan  and laying down.  She continues on nortriptyline  25 mg nightly.  Her main concern today is in regards to continued dizziness with imbalance, slurred speech and intermittent bilateral arm and leg shaking.  She is frustrated regarding continued symptoms without clear cause.  No clear trigger for dizziness or extremity shaking.  Can occur while standing, walking and occasionally when sitting.  Extremity shaking occurs about every other day, duration can vary.  Does not lose consciousness and not associated with any other symptoms.  Denies any worsening of symptoms.   She did undergo left knee replacement surgery back in August, still has some pain, completed therapies but continues HEP.  Also underwent right hand trigger finger release of 3 fingers, hand still wrapped, doing therapies currently.     Headache days per month: 4   Current Treatment: Abortive Naratriptan   Preventative Nortriptyline   Prior Therapies                                  Imitrex 50 mg PRN - lack of efficacy Rizatriptan  -lack of efficacy Propranolol  -hypotension Prozac Gabapentin   Nortriptyline     LABS: CBC    Component Value Date/Time   WBC 6.5 02/01/2024 1856   RBC 4.31 02/01/2024 1856   HGB 13.5 02/01/2024 1856   HCT 41.5 02/01/2024 1856   PLT 235 02/01/2024 1856   MCV 96.3 02/01/2024 1856   MCH 31.3 02/01/2024 1856   MCHC 32.5 02/01/2024 1856   RDW 13.2 02/01/2024 1856   LYMPHSABS 1.9 11/02/2023 1250   MONOABS 0.5 11/02/2023 1250   EOSABS 0.1 11/02/2023 1250   BASOSABS 0.0 11/02/2023 1250      Latest Ref Rng & Units 02/01/2024    6:56 PM 11/02/2023   12:50 PM 05/21/2023    4:13 AM  CMP  Glucose 70 - 99 mg/dL 92  161  096   BUN 6 - 20 mg/dL 7  10  12    Creatinine 0.44 - 1.00 mg/dL 0.45  4.09  8.11   Sodium 135 - 145 mmol/L 142  142  138   Potassium 3.5 - 5.1 mmol/L 3.5  3.9  3.8   Chloride 98 - 111 mmol/L 102  104  103   CO2 22 - 32 mmol/L 31  28  27    Calcium 8.9 - 10.3 mg/dL 9.3  9.2  8.5   Total Protein 6.5 - 8.1 g/dL 7.2     Total Bilirubin 0.0 - 1.2 mg/dL 0.5  Alkaline Phos 38 - 126 U/L 82     AST 15 - 41 U/L 28     ALT 0 - 44 U/L 24         Current Outpatient Medications on File Prior to Visit  Medication Sig Dispense Refill   acetaminophen  (TYLENOL ) 500 MG tablet Take 2 tablets (1,000 mg total) by mouth every 8 (eight) hours as needed for moderate pain, fever or headache. 30 tablet 0   albuterol  (PROVENTIL  HFA;VENTOLIN  HFA) 108 (90 Stevens) MCG/ACT inhaler Inhale 2 puffs into the lungs every 4 (four) hours as needed for wheezing or shortness of breath.      CALCIUM CITRATE PO Take 600 mg by mouth daily.     cetirizine (ZYRTEC) 10 MG tablet Take 10 mg by mouth daily.     Cholecalciferol (VITAMIN D ) 50 MCG (2000 UT) CAPS Take 2,000 Units by mouth daily.     EPINEPHrine  0.3 mg/0.3 mL IJ SOAJ injection Inject 0.3 mg into the muscle as needed for anaphylaxis.     famotidine  (PEPCID ) 40 MG tablet Take 40 mg by mouth at  bedtime.     FLAREX  0.1 % ophthalmic suspension Apply 1 drop to eye 2 (two) times daily as needed (itching eyes).     gabapentin  (NEURONTIN ) 300 MG capsule Take 300 mg by mouth 2 (two) times daily.     Lifitegrast (XIIDRA) 5 % SOLN Place 1 drop into both eyes 2 (two) times daily as needed (dry eyes).     methocarbamol  (ROBAXIN ) 500 MG tablet Take 1 tablet (500 mg total) by mouth every 6 (six) hours as needed. 20 tablet 0   naratriptan  (AMERGE) 2.5 MG tablet TAKE 1 TABLET (2.5 MG TOTAL) BY MOUTH AS NEEDED FOR MIGRAINE. TAKE ONE (1) TABLET AT ONSET OF HEADACHE IF RETURNS OR DOES NOT RESOLVE, MAY REPEAT AFTER 4 HOURS DO NOT EXCEED FIVE (5) MG IN 24 HOURS. 10 tablet 11   nortriptyline  (PAMELOR ) 25 MG capsule TAKE 1 CAPSULE BY MOUTH AT BEDTIME. 90 capsule 1   omeprazole (PRILOSEC) 40 MG capsule Take 40 mg by mouth daily.     ondansetron  (ZOFRAN ) 4 MG tablet Take 1 tablet (4 mg total) by mouth every 8 (eight) hours as needed for nausea or vomiting. 30 tablet 0   Pediatric Multiple Vit-C-FA (MULTIVITAMIN ANIMAL SHAPES, WITH CA/FA,) with C & FA chewable tablet Chew 1 tablet by mouth daily.     SYMBICORT 80-4.5 MCG/ACT inhaler Inhale 1 puff into the lungs 2 (two) times daily.     No current facility-administered medications on file prior to visit.     Allergies: Allergies  Allergen Reactions   Peanut-Containing Drug Products Swelling and Anaphylaxis    SWELLING REACTION UNSPECIFIED   Shellfish Allergy Anaphylaxis   Sulfa Antibiotics Hives   Ibuprofen  Other (See Comments)    Due to gastric surgery   Oxycodone  Other (See Comments)   Oxycodone -Acetaminophen  Other (See Comments)    Causes Headaches, Dizziness    Triamcinolone Other (See Comments)    Pt is unsure of reaction    Percocet [Oxycodone -Acetaminophen ] Other (See Comments)    PT STATES THAT SHE HAS HEADACHES WITH THIS MED    Family History: Migraine or other headaches in the family:  sister Aneurysms in a first degree relative:   no Brain tumors in the family:  no Other neurological illness in the family:   no  Past Medical History: Past Medical History:  Diagnosis Date   Anemia    Anxiety  Arthritis    Asthma    GERD (gastroesophageal reflux disease)    Headache    Hepatomegaly    Hx of migraines    Hyperlipidemia    Ovarian cyst    Thyroid  disease     Past Surgical History Past Surgical History:  Procedure Laterality Date   ABDOMINAL HYSTERECTOMY     ANTERIOR CERVICAL DECOMP/DISCECTOMY FUSION N/A 02/18/2020   Procedure: Anterior Cervical Decompression/Discectomy Fusion - Cervical five-Cervcal six;  Surgeon: Isadora Mar, MD;  Location: [facility];  Service: Neurosurgery;  Laterality: N/A;   BACK SURGERY     BTL  07/30/2008   CARPAL TUNNEL RELEASE Right    CARPAL TUNNEL RELEASE Left 09/22/2016   Procedure: CARPAL TUNNEL RELEASE, left;  Surgeon: Florida Hurter, MD;  Location: [facility];  Service: Orthopedics;  Laterality: Left;   CESAREAN SECTION     CYST EXCISION Right 12/15/2022   DORSAL COMPARTMENT RELEASE Left 09/22/2016   Procedure: RELEASE DORSAL COMPARTMENT (DEQUERVAIN);  Surgeon: Florida Hurter, MD;  Location:  SURGERY CENTER;  Service: Orthopedics;  Laterality: Left;   ELBOW SURGERY Right    gastric by pass     HYSTEROSCOPY W/ ENDOMETRIAL ABLATION     KNEE ARTHROPLASTY Left 05/19/2023   Procedure: COMPUTER ASSISTED TOTAL KNEE ARTHROPLASTY;  Surgeon: Adonica Hoose, MD;  Location: [facility];  Service: Orthopedics;  Laterality: Left;  160   KNEE SURGERY Left    meniscus tear and a spur   TUBAL LIGATION     WISDOM TOOTH EXTRACTION      Social History: Social History   Tobacco Use   Smoking status: Never   Smokeless tobacco: Never  Vaping Use   Vaping status: Never Used  Substance Use Topics   Alcohol  use: Not Currently   Drug use: No    ROS: Negative for fevers, chills. Positive for headaches. All other systems reviewed and negative unless stated  otherwise in HPI.   Physical Exam:   Vital Signs: LMP 05/03/2012  GENERAL: well appearing pleasant middle-age Caucasian female,in no acute distress,alert SKIN:  Color, texture, turgor normal. No rashes or lesions HEAD:  Normocephalic/atraumatic. CV:  RRR RESP: Normal respiratory effort  NEUROLOGICAL: Mental Status: Alert, oriented to person, place and time,Follows commands.  Unable to appreciate aphasia or dysarthria. Cranial Nerves: PERRL, visual fields intact to confrontation, extraocular movements intact, facial sensation intact, no facial droop or ptosis, hearing grossly intact Motor: muscle strength 5/5 although limited LLE testing due to recent knee replacement and unable to test right hand grip strength and dexterity due to recent trigger finger release surgery.  No evidence of action or resting tremor in upper or lower extremities, no bradykinesia or rigidity Reflexes: 2+ throughout Sensation: intact to light touch all 4 extremities Coordination: Finger-to- nose-finger intact bilaterally Gait: normal-based     IMPRESSION: 53 year old female with a history of cervical spondylosis s/p C5-6 fusion, asthma, anxiety, migraines who presents for follow-up of migraine headaches.  She was previously seen by Dr. Billy Bue in July for 70-month onset of intermittent upper and lower extremity shaking, generalized weakness, slurred speech, imbalance, and dizziness.  Extensive workup including MRI brain, C-spine and CTA head/neck unremarkable.  Lab work unremarkable except slightly elevated TSH.  Migraines initially improved on nortriptyline  although still 12/month, dosage increased back in February to 25mg  nightly, currently having about 4-5 per month. Question possible side effect of nortriptyline  as multiple symptom onset about 2 months after dosage adjustment.    PLAN:  1.  Migraines -Preventive: Recommend decreasing nortriptyline  to 10 mg nightly. If symptoms persist but migraines worsen  after about 2-3 weeks, would recommend returning back to 25mg  nightly. If symptoms improve but migraines worsen, advised to call to discuss other treatment options such as CRGP -Rescue: Continue naratriptan  2.5 mg as needed  -Counseled on limiting rescue medication use to 2 days per week to avoid rebound headaches   2. Bilateral arm/leg shakiness 3. Slurred speech 4. Dizziness - No clear cause for symptoms - Did not improve after lowering nortriptyline  dosage  -MRI brain showed small vessel disease but otherwise unremarkable for contributing findings -MRI C-spine postoperative vision changes of anterior cervical fusion of C5-6 and C6-7 and only mild degenerative changes at C4-5 and C7-T1 but without significant compression -CTA head/neck unremarkable -TSH elevated possibly contributing to fatigue and weakness, advised follow-up with PCP -B12 and CMP WNL -No further workup indicated from our standpoint for multitude of symptoms as noted above, she was encouraged to follow-up with PCP regarding elevated TSH as this may be contributing some.  Discussed evaluation with PT and SLP but declines interest at this time. Question underlying psych component contributing, consider BH evaluation but will defer to PCP    Follow-up in 6 months or call earlier if needed    I spent 40 minutes of face-to-face and non-face-to-face time with patient.  This included previsit chart review, lab review, study review, electronic health record documentation, patient education and discussion regarding migraine headaches and treatment plan and answered all other questions to patient satisfaction  Bridget Stevens, [facility]  [facility] [address] [place], [place] [address]  Phone [phone] Fax [phone] Note: This document was prepared with digital dictation and possible smart phrase technology. Any transcriptional errors that result from this process are unintentional.

## 2024-03-22 ENCOUNTER — Encounter: Payer: Self-pay | Admitting: Adult Health

## 2024-03-22 ENCOUNTER — Ambulatory Visit: Payer: Medicaid Other | Admitting: Adult Health

## 2024-03-22 VITALS — BP 116/80 | HR 78 | Ht 60.0 in | Wt 157.0 lb

## 2024-03-22 DIAGNOSIS — R4781 Slurred speech: Secondary | ICD-10-CM | POA: Diagnosis not present

## 2024-03-22 DIAGNOSIS — R42 Dizziness and giddiness: Secondary | ICD-10-CM | POA: Diagnosis not present

## 2024-03-22 DIAGNOSIS — G43101 Migraine with aura, not intractable, with status migrainosus: Secondary | ICD-10-CM

## 2024-03-22 DIAGNOSIS — R259 Unspecified abnormal involuntary movements: Secondary | ICD-10-CM | POA: Diagnosis not present

## 2024-03-22 DIAGNOSIS — R55 Syncope and collapse: Secondary | ICD-10-CM

## 2024-03-22 MED ORDER — NORTRIPTYLINE HCL 25 MG PO CAPS: 25.0000 mg | ORAL_CAPSULE | Freq: Every day | ORAL | 3 refills | Status: DC

## 2024-03-22 MED ORDER — NARATRIPTAN HCL 2.5 MG PO TABS: 2.5000 mg | ORAL_TABLET | ORAL | 11 refills | Status: DC | PRN

## 2024-03-22 NOTE — Patient Instructions (Addendum)
 Your Plan:  Continue nortriptyline  25mg  nightly for migraine headaches  Continue naratriptan  as needed      Will be scheduled with one of our doctors at their next available for further evaluation of slurred speech, dizziness and arm/leg shaking       Thank you for coming to see us  at [facility]. I hope we have been able to provide you high quality care today.  You may receive a patient satisfaction survey over the next few weeks. We would appreciate your feedback and comments so that we may continue to improve ourselves and the health of our patients.

## 2024-03-23 NOTE — Telephone Encounter (Signed)
Called patient and left detailed message to call back and schedule. 

## 2024-04-05 ENCOUNTER — Other Ambulatory Visit: Payer: Medicaid Other

## 2024-04-18 ENCOUNTER — Ambulatory Visit (HOSPITAL_BASED_OUTPATIENT_CLINIC_OR_DEPARTMENT_OTHER)
Admission: RE | Admit: 2024-04-18 | Discharge: 2024-04-18 | Disposition: A | Discharge status: Home / Self Care | Source: Ambulatory Visit | Attending: Registered Nurse | Admitting: Registered Nurse

## 2024-04-18 DIAGNOSIS — Z780 Asymptomatic menopausal state: Secondary | ICD-10-CM | POA: Insufficient documentation

## 2024-05-02 ENCOUNTER — Encounter: Payer: Self-pay | Admitting: Cardiology

## 2024-05-02 NOTE — Telephone Encounter (Signed)
No answer , did not leave a message.

## 2024-05-16 ENCOUNTER — Other Ambulatory Visit: Payer: Self-pay | Admitting: Student

## 2024-05-16 DIAGNOSIS — M5416 Radiculopathy, lumbar region: Secondary | ICD-10-CM

## 2024-05-24 ENCOUNTER — Ambulatory Visit
Admission: RE | Admit: 2024-05-24 | Discharge: 2024-05-24 | Disposition: A | Discharge status: Home / Self Care | Source: Ambulatory Visit | Attending: Student | Admitting: Student

## 2024-05-24 DIAGNOSIS — M5416 Radiculopathy, lumbar region: Secondary | ICD-10-CM

## 2024-06-04 ENCOUNTER — Encounter: Payer: Self-pay | Admitting: Neurology

## 2024-06-04 ENCOUNTER — Ambulatory Visit: Admitting: Neurology

## 2024-06-04 VITALS — BP 124/78 | Resp 14 | Ht 60.0 in | Wt 166.0 lb

## 2024-06-04 DIAGNOSIS — G43709 Chronic migraine without aura, not intractable, without status migrainosus: Secondary | ICD-10-CM | POA: Insufficient documentation

## 2024-06-04 DIAGNOSIS — R259 Unspecified abnormal involuntary movements: Secondary | ICD-10-CM | POA: Insufficient documentation

## 2024-06-04 DIAGNOSIS — R269 Unspecified abnormalities of gait and mobility: Secondary | ICD-10-CM | POA: Insufficient documentation

## 2024-06-04 MED ORDER — ONDANSETRON 4 MG PO TBDP: 4.0000 mg | ORAL_TABLET | Freq: Three times a day (TID) | ORAL | 6 refills | Status: AC | PRN

## 2024-06-04 MED ORDER — NORTRIPTYLINE HCL 25 MG PO CAPS: 50.0000 mg | ORAL_CAPSULE | Freq: Every day | ORAL | 3 refills | Status: AC

## 2024-06-04 NOTE — Progress Notes (Signed)
 ASSESSMENT AND PLAN  Bridget Stevens is a 53 y.o. female   Chronic migraine headache  Did respond to nortriptyline  low-dose 25 mg, with titrating to 50 mg every night,  Did well with Amerge as needed, may combine Zofran , Tylenol  as needed for prolonged severe headaches Recent onset of uncontrollable upper extremity movement, gait abnormality,  Already had extensive evaluation over the past few months,  Including MRI of the brain with without contrast, mild small vessel disease in September 2024,  Normal CT angiogram head and neck  MRI of cervical spine in September 2024, evidence of anterior cervical fusion C5-6, C6-7, with no significant canal foraminal narrowing  MRI of the lumbar spine, posterior operative change of L4-S1 PLIF, no significant canal foraminal stenosis,  Already under the care of neurosurgeon/pain management, is receiving epidural injection, physical therapy,  She is very frustrated about her symptoms, under extreme stress, taking care of her 45 years old daughter, would like to  figure out what is going on with my body, EEG    If EEG normal, highly suspicious for constellation of symptoms related to her extreme stress,     DIAGNOSTIC DATA (LABS, IMAGING, TESTING) - I reviewed patient records, labs, notes, testing and imaging myself where available.   MEDICAL HISTORY:  Bridget Stevens is a 53 year old female, alone at visit, seen by Dr.Chima and Harlene in the past for chronic migraine, primary care is [facility]  Tann, Samandra,     History is obtained from the patient and review of electronic medical records. I personally reviewed pertinent available imaging films in PACS.   PMHx of  Asthma Chronic Migraine,  Hx of gastric bypass in 2020, lost weight 70 Lb initially, now gain back 30 Lbs. Hx of cervical decompression in May 2021, presenting to neck pain, shoulder pain Lumbar decompression presenting with low back pain, radiating pain to  legs.  She has been on disability since 2014, prior to that, she worked at a production line, had cervical, lumbar decompression surgery, left knee replacement, bilateral carpal tunnel release surgery, have chronic joints pain, reported excessive stress at home, she lost the custody of her daughter in 2022, was able to get her back in 2024, she lives with her daughter and her roommate, has a lot of stress taking care of her 6 years old teenager,  She has difficulty sleeping sometimes, chronic migraine headache for many years, often preceded by visual changes, since starting nortriptyline  25 mg every night, it had helped her sleep better, helped her migraine, now has 6-7 typical migraine a month, Amerge as needed was helpful,  She complains of recent worsening uncontrollable bilateral hands movement, dropped things, no loss of consciousness  She continued to be under the care of [facility], pain management, had extensive imaging study, receiving epidural injection occasionally, also has physical therapy   MRI of the brain with without contrast, mild small vessel disease in September 2024, Normal CT angiogram head and neck   MRI of cervical spine in September 2024, evidence of anterior cervical fusion C5-6, C6-7, with no significant canal foraminal narrowing   MRI of the lumbar spine, posterior operative change of L4-S1 PLIF, no significant canal foraminal stenosis,   She is very frustrated about her symptoms, under extreme stress, taking care of her 69 years old daughter, would like to  figure out what is going on with my body    PHYSICAL EXAM:   Vitals:   06/04/24 0900  BP: 124/78  Resp: 14  Weight: 166 lb (75.3 kg)  Height: 5' (1.524 m)   Body mass index is 32.42 kg/m.  PHYSICAL EXAMNIATION:  Gen: NAD, conversant, well nourised, well groomed                     Cardiovascular: Regular rate rhythm, no peripheral edema, warm, nontender. Eyes: Conjunctivae clear  without exudates or hemorrhage Neck: Supple, no carotid bruits. Pulmonary: Clear to auscultation bilaterally   NEUROLOGICAL EXAM:  MENTAL STATUS: Speech/cognition: Anxious looking middle-age female, awake, alert, oriented to history taking and casual conversation CRANIAL NERVES: CN II: Visual fields are full to confrontation. Pupils are round equal and briskly reactive to light. CN III, IV, VI: extraocular movement are normal. No ptosis. CN V: Facial sensation is intact to light touch CN VII: Face is symmetric with normal eye closure  CN VIII: Hearing is normal to causal conversation. CN IX, X: Phonation is normal. CN XI: Head turning and shoulder shrug are intact  MOTOR: Normal strength, well-healed left knee replacement scar,  REFLEXES: Reflexes are 2+ and symmetric at the biceps, triceps, knees, and ankles. Plantar responses are extensor bilaterally.  SENSORY: Intact to light touch, pinprick and vibratory sensation are intact in fingers and toes.  COORDINATION: There is no trunk or limb dysmetria noted.  GAIT/STANCE: Push-up, mildly antalgic  REVIEW OF SYSTEMS:  Full 14 system review of systems performed and notable only for as above All other review of systems were negative.   ALLERGIES: Allergies  Allergen Reactions   Hydrocodone      Other Reaction(s): dizziness   Peanut-Containing Drug Products Anaphylaxis, Swelling and Other (See Comments)    SWELLING REACTION UNSPECIFIED  peanut allergenic extract   Shellfish Allergy Anaphylaxis   Sulfa Antibiotics Hives   Wheat Extract Other (See Comments)    wheat gluten extract   Ibuprofen  Other (See Comments)    Due to gastric surgery   Oxycodone  Other (See Comments)   Oxycodone -Acetaminophen  Other (See Comments)    Causes Headaches, Dizziness  acetaminophen  / oxycodone    Triamcinolone Other (See Comments)    Pt is unsure of reaction    Penicillins     Other Reaction(s): dizziness   Percocet  [Oxycodone -Acetaminophen ] Other (See Comments)    PT STATES THAT SHE HAS HEADACHES WITH THIS MED    HOME MEDICATIONS: Current Outpatient Medications  Medication Sig Dispense Refill   acetaminophen  (TYLENOL ) 500 MG tablet Take 2 tablets (1,000 mg total) by mouth every 8 (eight) hours as needed for moderate pain, fever or headache. 30 tablet 0   albuterol  (PROVENTIL  HFA;VENTOLIN  HFA) 108 (90 Base) MCG/ACT inhaler Inhale 2 puffs into the lungs every 4 (four) hours as needed for wheezing or shortness of breath.      CALCIUM CITRATE PO Take 600 mg by mouth daily.     cetirizine (ZYRTEC) 10 MG tablet Take 10 mg by mouth daily.     Cholecalciferol (VITAMIN D ) 50 MCG (2000 UT) CAPS Take 2,000 Units by mouth daily.     EPINEPHrine  0.3 mg/0.3 mL IJ SOAJ injection Inject 0.3 mg into the muscle as needed for anaphylaxis.     famotidine  (PEPCID ) 40 MG tablet Take 40 mg by mouth at bedtime.     FLAREX  0.1 % ophthalmic suspension Apply 1 drop to eye 2 (two) times daily as needed (itching eyes).     gabapentin  (NEURONTIN ) 300 MG capsule Take 300 mg by mouth 2 (two) times daily.     Lifitegrast (  XIIDRA) 5 % SOLN Place 1 drop into both eyes as needed (dry eyes).     methocarbamol  (ROBAXIN ) 500 MG tablet Take 1 tablet (500 mg total) by mouth every 6 (six) hours as needed. 20 tablet 0   naratriptan  (AMERGE) 2.5 MG tablet Take 1 tablet (2.5 mg total) by mouth as needed for migraine. Take one (1) tablet at onset of headache; if returns or does not resolve, may repeat after 4 hours; do not exceed five (5) mg in 24 hours. 10 tablet 11   nortriptyline  (PAMELOR ) 25 MG capsule Take 1 capsule (25 mg total) by mouth at bedtime. 90 capsule 3   omeprazole (PRILOSEC) 40 MG capsule Take 40 mg by mouth daily.     Pediatric Multiple Vit-C-FA (MULTIVITAMIN ANIMAL SHAPES, WITH CA/FA,) with C & FA chewable tablet Chew 1 tablet by mouth daily.     SYMBICORT 80-4.5 MCG/ACT inhaler Inhale 1 puff into the lungs 2 (two) times daily.      No current facility-administered medications for this visit.    PAST MEDICAL HISTORY: Past Medical History:  Diagnosis Date   Anemia    Anxiety    Arthritis    Asthma    GERD (gastroesophageal reflux disease)    Headache    Hepatomegaly    Hx of migraines    Hyperlipidemia    Ovarian cyst    Thyroid  disease     PAST SURGICAL HISTORY: Past Surgical History:  Procedure Laterality Date   ABDOMINAL HYSTERECTOMY     ANTERIOR CERVICAL DECOMP/DISCECTOMY FUSION N/A 02/18/2020   Procedure: Anterior Cervical Decompression/Discectomy Fusion - Cervical five-Cervcal six;  Surgeon: Joshua Alm RAMAN, MD;  Location: [facility];  Service: Neurosurgery;  Laterality: N/A;   BACK SURGERY     BTL  07/30/2008   CARPAL TUNNEL RELEASE Right    CARPAL TUNNEL RELEASE Left 09/22/2016   Procedure: CARPAL TUNNEL RELEASE, left;  Surgeon: Donnice Robinsons, MD;  Location: [facility];  Service: Orthopedics;  Laterality: Left;   CESAREAN SECTION     CYST EXCISION Right 12/15/2022   DORSAL COMPARTMENT RELEASE Left 09/22/2016   Procedure: RELEASE DORSAL COMPARTMENT (DEQUERVAIN);  Surgeon: Donnice Robinsons, MD;  Location: [facility];  Service: Orthopedics;  Laterality: Left;   ELBOW SURGERY Right    gastric by pass     HYSTEROSCOPY W/ ENDOMETRIAL ABLATION     KNEE ARTHROPLASTY Left 05/19/2023   Procedure: COMPUTER ASSISTED TOTAL KNEE ARTHROPLASTY;  Surgeon: Fidel Rogue, MD;  Location: [facility];  Service: Orthopedics;  Laterality: Left;  160   KNEE SURGERY Left    meniscus tear and a spur   TUBAL LIGATION     WISDOM TOOTH EXTRACTION      FAMILY HISTORY: Family History  Problem Relation Age of Onset   Cancer Mother    Hypertension Mother    Thyroid  disease Mother    Diabetes Father    Hypertension Father    Hyperlipidemia Father    Thyroid  disease Father    Asthma Father    Cancer Sister        CERVICAL   Thyroid  disease Sister    Asthma Sister    Asthma Brother     Breast cancer Maternal Grandmother        in her 21s    SOCIAL HISTORY: Social History   Socioeconomic History   Marital status: Divorced    Spouse name: Not on file   Number of children: 1   Years of education: Not  on file   Highest education level: Not on file  Occupational History   Not on file  Tobacco Use   Smoking status: Never   Smokeless tobacco: Never  Vaping Use   Vaping status: Never Used  Substance and Sexual Activity   Alcohol  use: Not Currently   Drug use: No   Sexual activity: Yes    Partners: Male    Birth control/protection: Surgical    Comment: BTL  Other Topics Concern   Not on file  Social History Narrative   Right handed   Wear glasses    Drinks coffee one cup daily, drinks 1 soda per day, drinks hot tea   Social Drivers of Health   Financial Resource Strain: Low Risk  (01/28/2024)   Received from [facility]   Overall Financial Resource Strain (CARDIA)    Difficulty of Paying Living Expenses: Not hard at all  Food Insecurity: No Food Insecurity (01/28/2024)   Received from [facility]   Hunger Vital Sign    Within the past 12 months, you worried that your food would run out before you got the money to buy more.: Never true    Within the past 12 months, the food you bought just didn't last and you didn't have money to get more.: Never true  Transportation Needs: No Transportation Needs (01/28/2024)   Received from [facility] - Transportation    Lack of Transportation (Medical): No    Lack of Transportation (Non-Medical): No  Physical Activity: Unknown (01/28/2024)   Received from [facility]   Exercise Vital Sign    On average, how many days per week do you engage in moderate to strenuous exercise (like a brisk walk)?: 0 days    Minutes of Exercise per Session: Not on file  Stress: No Stress Concern Present (01/28/2024)   Received from [facility] - Occupational Stress  Questionnaire    Feeling of Stress : Not at all  Social Connections: Patient Declined (01/28/2024)   Received from [facility]   Social Network    How would you rate your social network (family, work, friends)?: Patient declined  Intimate Partner Violence: Not At Risk (01/28/2024)   Received from [facility]   HITS    Over the last 12 months how often did your partner physically hurt you?: Never    Over the last 12 months how often did your partner insult you or talk down to you?: Never    Over the last 12 months how often did your partner threaten you with physical harm?: Never    Over the last 12 months how often did your partner scream or curse at you?: Never      Modena Callander, M.D. Ph.D.  [facility] [address], [address] [place], [place] [address] Ph: [phone] Fax: [phone]  CC:  Macdonald Ping, NP [address] [place],  [place] [address]  Tann, Samandra, NP

## 2024-06-06 ENCOUNTER — Encounter: Payer: Self-pay | Admitting: Gastroenterology

## 2024-06-06 ENCOUNTER — Ambulatory Visit: Admitting: Gastroenterology

## 2024-06-06 VITALS — BP 112/80 | HR 87 | Ht 60.0 in | Wt 164.2 lb

## 2024-06-06 DIAGNOSIS — M940 Chondrocostal junction syndrome [Tietze]: Secondary | ICD-10-CM | POA: Diagnosis not present

## 2024-06-06 DIAGNOSIS — Z9884 Bariatric surgery status: Secondary | ICD-10-CM

## 2024-06-06 DIAGNOSIS — K449 Diaphragmatic hernia without obstruction or gangrene: Secondary | ICD-10-CM | POA: Diagnosis not present

## 2024-06-06 DIAGNOSIS — Z860100 Personal history of colon polyps, unspecified: Secondary | ICD-10-CM

## 2024-06-06 DIAGNOSIS — K219 Gastro-esophageal reflux disease without esophagitis: Secondary | ICD-10-CM | POA: Diagnosis not present

## 2024-06-06 NOTE — Patient Instructions (Signed)
 Follow up as needed.  Thank you for entrusting me with your care and for choosing [facility], Dr. Sandor Flatter  _______________________________________________________  If your blood pressure at your visit was 140/90 or greater, please contact your primary care physician to follow up on this.  _______________________________________________________  If you are age 55 or older, your body mass index should be between 23-30. Your Body mass index is 32.07 kg/m. If this is out of the aforementioned range listed, please consider follow up with your Primary Care Provider.  If you are age 49 or younger, your body mass index should be between 19-25. Your Body mass index is 32.07 kg/m. If this is out of the aformentioned range listed, please consider follow up with your Primary Care Provider.   ________________________________________________________  The [facility] providers would like to encourage you to use MYCHART to communicate with providers for non-urgent requests or questions.  Due to long hold times on the telephone, sending your provider a message by [facility] may be a faster and more efficient way to get a response.  Please allow 48 business hours for a response.  Please remember that this is for non-urgent requests.  _______________________________________________________  [facility] is using a team-based approach to care.  Your team is made up of your doctor and two to three APPS. Our APPS (Nurse Practitioners and Physician Assistants) work with your physician to ensure care continuity for you. They are fully qualified to address your health concerns and develop a treatment plan. They communicate directly with your gastroenterologist to care for you. Seeing the Advanced Practice Practitioners on your physician's team can help you by facilitating care more promptly, often allowing for earlier appointments, access to diagnostic testing, procedures, and other specialty  referrals.

## 2024-06-06 NOTE — Progress Notes (Signed)
 Chief Complaint: GERD   Referring Provider:     Tann, Samandra, NP   HPI:     Bridget Stevens is a 53 y.o. female with history as below referred to the Gastroenterology Clinic for evaluation of reflux symptoms, abdominal pain. Mostly here to establish care with a new GI practice due to proximity.   History of Roux-en-Y gastric bypass in 2020.   Reflux currently well controlled with Prilosec 40 mg AQM and Pepcid  40 mg at bedtime.   Previously followed [facility], last seen 01/31/2024 for continued evaluation of reflux symptoms.  Has been treated with Prilosec 20 mg twice daily and recommended increasing to 40 mg in the morning and Pepcid  in the afternoon.  Was also c/o epigastric pain, right and left upper quadrant discomfort, alternating bowel habits.  Ordered upper GI series and ultrasound. - 06/2019: Colonoscopy: 5 mm sessile serrated adenoma.  Recommended repeat in 7 years - 11/2023: EGD: Small hiatal hernia, GE junction at 35 cm, small gastric pouch with wide open gastrojejunostomy - 02/01/2024: UGI series: Unremarkable.  Widely patent GE junction, postsurgical changes with widely patent anastomosis - 02/01/2024: ER evaluation for abdominal pain.  CT Abdo/pelvis: Dense contrast throughout the colon.  No obstructive or inflammatory changes in the GI tract.  Normal CBC, CMP, lipase - 02/05/2024: RUQ US : Mildly dilated extrahepatic biliary tree with CBD 1 cm, unchanged from prior CT.  No choledocholithiasis.  Fhx notable for Mother with Esophageal CA.   She is otherwise without any specific complaints or issues today.  Does have chronic xiphoid and sternal pain and tenderness to palpation.  Unrelated to p.o. intake.  Pain is along inferior sternum, xiphoid, and costochondral margins.   Past Medical History:  Diagnosis Date   Anemia    Anxiety    Arthritis    Asthma    GERD (gastroesophageal reflux disease)    Headache    Hepatomegaly    Hx of migraines     Hyperlipidemia    Ovarian cyst    Thyroid  disease      Past Surgical History:  Procedure Laterality Date   ABDOMINAL HYSTERECTOMY     ANTERIOR CERVICAL DECOMP/DISCECTOMY FUSION N/A 02/18/2020   Procedure: Anterior Cervical Decompression/Discectomy Fusion - Cervical five-Cervcal six;  Surgeon: Joshua Alm GORMAN, MD;  Location: [facility] OR;  Service: Neurosurgery;  Laterality: N/A;   BACK SURGERY     BTL  07/30/2008   CARPAL TUNNEL RELEASE Right    CARPAL TUNNEL RELEASE Left 09/22/2016   Procedure: CARPAL TUNNEL RELEASE, left;  Surgeon: Donnice Robinsons, MD;  Location: [facility];  Service: Orthopedics;  Laterality: Left;   CESAREAN SECTION     CYST EXCISION Right 12/15/2022   DORSAL COMPARTMENT RELEASE Left 09/22/2016   Procedure: RELEASE DORSAL COMPARTMENT (DEQUERVAIN);  Surgeon: Donnice Robinsons, MD;  Location: [facility];  Service: Orthopedics;  Laterality: Left;   ELBOW SURGERY Right    gastric by pass     HYSTEROSCOPY W/ ENDOMETRIAL ABLATION     KNEE ARTHROPLASTY Left 05/19/2023   Procedure: COMPUTER ASSISTED TOTAL KNEE ARTHROPLASTY;  Surgeon: Fidel Rogue, MD;  Location: [facility];  Service: Orthopedics;  Laterality: Left;  160   KNEE SURGERY Left    meniscus tear and a spur   TUBAL LIGATION     WISDOM TOOTH EXTRACTION     Family History  Problem Relation Age of Onset   Cancer Mother  Hypertension Mother    Thyroid  disease Mother    Diabetes Father    Hypertension Father    Hyperlipidemia Father    Thyroid  disease Father    Asthma Father    Cancer Sister        CERVICAL   Thyroid  disease Sister    Asthma Sister    Asthma Brother    Breast cancer Maternal Grandmother        in her 70s   Social History   Tobacco Use   Smoking status: Never   Smokeless tobacco: Never  Vaping Use   Vaping status: Never Used  Substance Use Topics   Alcohol  use: Not Currently   Drug use: No   Current Outpatient Medications  Medication Sig Dispense  Refill   acetaminophen  (TYLENOL ) 500 MG tablet Take 2 tablets (1,000 mg total) by mouth every 8 (eight) hours as needed for moderate pain, fever or headache. 30 tablet 0   albuterol  (PROVENTIL  HFA;VENTOLIN  HFA) 108 (90 Base) MCG/ACT inhaler Inhale 2 puffs into the lungs every 4 (four) hours as needed for wheezing or shortness of breath.      CALCIUM CITRATE PO Take 600 mg by mouth daily.     cetirizine (ZYRTEC) 10 MG tablet Take 10 mg by mouth daily.     Cholecalciferol (VITAMIN D ) 50 MCG (2000 UT) CAPS Take 2,000 Units by mouth daily.     EPINEPHrine  0.3 mg/0.3 mL IJ SOAJ injection Inject 0.3 mg into the muscle as needed for anaphylaxis.     famotidine  (PEPCID ) 40 MG tablet Take 40 mg by mouth at bedtime.     FLAREX  0.1 % ophthalmic suspension Apply 1 drop to eye 2 (two) times daily as needed (itching eyes).     gabapentin  (NEURONTIN ) 300 MG capsule Take 300 mg by mouth 2 (two) times daily.     Lifitegrast (XIIDRA) 5 % SOLN Place 1 drop into both eyes as needed (dry eyes).     methocarbamol  (ROBAXIN ) 500 MG tablet Take 1 tablet (500 mg total) by mouth every 6 (six) hours as needed. 20 tablet 0   naratriptan  (AMERGE) 2.5 MG tablet Take 1 tablet (2.5 mg total) by mouth as needed for migraine. Take one (1) tablet at onset of headache; if returns or does not resolve, may repeat after 4 hours; do not exceed five (5) mg in 24 hours. 10 tablet 11   nortriptyline  (PAMELOR ) 25 MG capsule Take 2 capsules (50 mg total) by mouth at bedtime. 180 capsule 3   omeprazole (PRILOSEC) 40 MG capsule Take 40 mg by mouth daily.     ondansetron  (ZOFRAN -ODT) 4 MG disintegrating tablet Take 1 tablet (4 mg total) by mouth every 8 (eight) hours as needed. 20 tablet 6   Pediatric Multiple Vit-C-FA (MULTIVITAMIN ANIMAL SHAPES, WITH CA/FA,) with C & FA chewable tablet Chew 1 tablet by mouth daily.     SYMBICORT 80-4.5 MCG/ACT inhaler Inhale 1 puff into the lungs 2 (two) times daily.     No current facility-administered  medications for this visit.   Allergies  Allergen Reactions   Hydrocodone      Other Reaction(s): dizziness   Peanut-Containing Drug Products Anaphylaxis, Swelling and Other (See Comments)    SWELLING REACTION UNSPECIFIED  peanut allergenic extract   Shellfish Allergy Anaphylaxis   Sulfa Antibiotics Hives   Wheat Extract Other (See Comments)    wheat gluten extract   Ibuprofen  Other (See Comments)    Due to gastric surgery   Oxycodone  Other (See  Comments)   Oxycodone -Acetaminophen  Other (See Comments)    Causes Headaches, Dizziness  acetaminophen  / oxycodone    Triamcinolone Other (See Comments)    Pt is unsure of reaction    Penicillins     Other Reaction(s): dizziness   Percocet [Oxycodone -Acetaminophen ] Other (See Comments)    PT STATES THAT SHE HAS HEADACHES WITH THIS MED     Review of Systems: All systems reviewed and negative except where noted in HPI.     Physical Exam:    Wt Readings from Last 3 Encounters:  06/06/24 164 lb 3.2 oz (74.5 kg)  06/04/24 166 lb (75.3 kg)  03/22/24 157 lb (71.2 kg)    BP 112/80   Pulse 87   Ht 5' (1.524 m)   Wt 164 lb 3.2 oz (74.5 kg)   LMP 05/03/2012   BMI 32.07 kg/m  Constitutional:  Pleasant, in no acute distress. Psychiatric: Normal mood and affect. Behavior is normal. Cardiovascular: Normal rate, regular rhythm. No edema Pulmonary/chest: Effort normal and breath sounds normal. No wheezing, rales or rhonchi. Abdominal: Soft, nondistended, nontender. Bowel sounds active throughout. There are no masses palpable. No hepatomegaly. Neurological: Alert and oriented to person place and time. Skin: Skin is warm and dry. No rashes noted.   ASSESSMENT AND PLAN;   1) GERD 2) Small hiatal hernia 3) History of Roux-en-Y gastric bypass Underwent EGD earlier this year which was notable for small hiatal hernia, normal Z-line at 35 cm, with widely patent gastrojejunostomy.  Subsequent UGI series was unremarkable with widely patent  GE junction and anastomosis.  Reflux otherwise well-controlled with current therapy.  No role for repeat endoscopy or other advanced testing such as pH/impedance, Bravo, etc. at this juncture. - Continue Prilosec 40 mg daily and Pepcid  40 mg at bedtime. - Continue antireflux lifestyle/dietary modifications  4) Costochondritis - Discussed diagnosis with patient today.  Recommend conservative management and can follow-up with PCP  5) History of colon polyps - Repeat colonoscopy in 2027 per previous Endoscopist recommendations for ongoing surveillance   RTC prn   Sandor LULLA Flatter, DO, FACG  06/06/2024, 11:19 AM   Tann, Samandra, NP

## 2024-06-12 ENCOUNTER — Ambulatory Visit: Admitting: Neurology

## 2024-06-12 DIAGNOSIS — G43709 Chronic migraine without aura, not intractable, without status migrainosus: Secondary | ICD-10-CM

## 2024-06-12 DIAGNOSIS — R269 Unspecified abnormalities of gait and mobility: Secondary | ICD-10-CM

## 2024-06-12 DIAGNOSIS — R259 Unspecified abnormal involuntary movements: Secondary | ICD-10-CM | POA: Diagnosis not present

## 2024-06-12 NOTE — Procedures (Unsigned)
   HISTORY:  TECHNIQUE:  This is a routine 16 channel EEG recording with one channel devoted to a limited EKG recording.  It was performed during wakefulness, drowsiness and asleep.  Hyperventilation and photic stimulation were performed as activating procedures.  There are minimum muscle and movement artifact noted.  Upon maximum arousal, posterior dominant waking rhythm consistent of rhythmic alpha range activity. Activities are symmetric over the bilateral posterior derivations and attenuated with eye opening.  Photic stimulation did not alter the tracing.  Hyperventilation produced mild/moderate buildup with higher amplitude and the slower activities noted.  During EEG recording, patient developed drowsiness and entered sleep, sleep EEG demonstrated architecture, there were frontal centrally dominant vertex waves and symmetric sleep spindles noted.  During EEG recording, there was no epileptiform discharge noted.  EKG demonstrate normal sinus rhythm.  CONCLUSION: This is a  normal awake EEG.  There is no electrodiagnostic evidence of epileptiform discharge.  Daine Gunther, M.D. Ph.D.  [facility] [address] [place], [place] [address] Phone: [phone] Fax:      [phone]

## 2024-06-13 ENCOUNTER — Ambulatory Visit: Admitting: Cardiology

## 2024-06-15 ENCOUNTER — Telehealth: Payer: Self-pay | Admitting: Neurology

## 2024-06-15 ENCOUNTER — Ambulatory Visit: Payer: Medicaid Other | Admitting: Cardiology

## 2024-06-15 DIAGNOSIS — R259 Unspecified abnormal involuntary movements: Secondary | ICD-10-CM

## 2024-06-15 DIAGNOSIS — G43709 Chronic migraine without aura, not intractable, without status migrainosus: Secondary | ICD-10-CM

## 2024-06-15 NOTE — Telephone Encounter (Signed)
 Please call patient, EEG showed no significant abnormality, but there was some sharp contoured synchronized activity, I discussed with epileptologist Dr. Gregg, suggesting 24 hours video EEG monitoring  Orders Placed This Encounter  Procedures   AMBULATORY EEG

## 2024-06-19 ENCOUNTER — Ambulatory Visit: Payer: Self-pay | Admitting: Neurology

## 2024-06-19 NOTE — Telephone Encounter (Signed)
 Pt aware and agreeable to 24 hour eeg. Form placed for md signature

## 2024-06-20 ENCOUNTER — Telehealth: Payer: Self-pay | Admitting: Gastroenterology

## 2024-06-20 DIAGNOSIS — Z8379 Family history of other diseases of the digestive system: Secondary | ICD-10-CM

## 2024-06-20 NOTE — Telephone Encounter (Signed)
 Inbound call from patient stating her daughter was diagnosed with Celiac Disease and advised by daughters pediatric GI provider that family members should be tested as well. Patient is requesting a call back to discuss further. Please advise, thank you.

## 2024-06-21 ENCOUNTER — Telehealth: Payer: Self-pay

## 2024-06-21 NOTE — Telephone Encounter (Signed)
 Emailed the eeg form to [facility] scheduling team:

## 2024-06-22 ENCOUNTER — Other Ambulatory Visit

## 2024-06-22 DIAGNOSIS — Z8379 Family history of other diseases of the digestive system: Secondary | ICD-10-CM

## 2024-06-23 LAB — TISSUE TRANSGLUTAMINASE ABS,IGG,IGA
(tTG) Ab, IgA: <1 U/mL
(tTG) Ab, IgG: <1 U/mL

## 2024-06-23 LAB — IGA: Immunoglobulin A: 159 mg/dL (ref 47–310)

## 2024-06-25 ENCOUNTER — Ambulatory Visit: Payer: Self-pay | Admitting: Gastroenterology

## 2024-06-26 DIAGNOSIS — G43709 Chronic migraine without aura, not intractable, without status migrainosus: Secondary | ICD-10-CM | POA: Diagnosis not present

## 2024-06-26 DIAGNOSIS — R259 Unspecified abnormal involuntary movements: Secondary | ICD-10-CM

## 2024-06-26 DIAGNOSIS — R4701 Aphasia: Secondary | ICD-10-CM | POA: Diagnosis not present

## 2024-06-27 ENCOUNTER — Other Ambulatory Visit: Payer: Self-pay | Admitting: Registered Nurse

## 2024-06-27 ENCOUNTER — Ambulatory Visit: Attending: Cardiovascular Disease | Admitting: Cardiology

## 2024-06-27 ENCOUNTER — Encounter: Payer: Self-pay | Admitting: Cardiology

## 2024-06-27 VITALS — BP 112/72 | HR 80 | Resp 16 | Ht 60.0 in | Wt 167.2 lb

## 2024-06-27 DIAGNOSIS — I951 Orthostatic hypotension: Secondary | ICD-10-CM | POA: Insufficient documentation

## 2024-06-27 DIAGNOSIS — R55 Syncope and collapse: Secondary | ICD-10-CM | POA: Diagnosis present

## 2024-06-27 DIAGNOSIS — Z1231 Encounter for screening mammogram for malignant neoplasm of breast: Secondary | ICD-10-CM

## 2024-06-27 NOTE — Progress Notes (Signed)
 Cardiology Office Note:  .   Date:  06/27/2024  ID:  Elveria GORMAN Daring, DOB 02/27/1971, MRN [id number] PCP:  Macdonald Ping, NP  Former Cardiology Providers: None [facility] Providers Cardiologist:  Madonna Large, DO , [facility] (established care 01/20/2023) Electrophysiologist:  None  Click to update primary MD,subspecialty MD or APP then REFRESH:1}    Chief Complaint  Patient presents with   Near Syncope   Orthostasis   Follow-up    History of Present Illness: .   GWENDLYON ZUMBRO is a 53 y.o. Caucasian female whose past medical history and cardiovascular risk factors includes: Hx of gastric bypass, History of migraines, carpal tunnel release bilateral, neuropathy.   Patient was referred to the practice for orthostatic hypotension/labile hypertension in the past.  She has been orthostatic at times but not always.  She is encouraged to increase her fluids, eat her 3 heart healthy meals, compression stockings, and change her positions slowly.  She was also advised in the past to avoid polypharmacy and the fact that she may also have a component of malabsorption given her history of gastric bypass.  Patient presents today for 1 year follow-up visit.  Over the last 1 year patient denies anginal chest pain or heart failure symptoms. Office blood pressures are very well-controlled. On occasion she does have episodes of feeling lightheaded and dizzy, but quickly resolves with increasing hydration and consumption of salty foods. No near-syncope or syncopal events since last office encounter  Review of Systems: .   Review of Systems  Cardiovascular:  Negative for chest pain, claudication, irregular heartbeat, leg swelling, near-syncope, orthopnea, palpitations, paroxysmal nocturnal dyspnea and syncope.  Respiratory:  Negative for shortness of breath.   Hematologic/Lymphatic: Negative for bleeding problem.    Studies Reviewed:   EKG: EKG Interpretation Date/Time:  Wednesday June 27 2024 13:49:20 EDT Ventricular Rate:  78 PR Interval:  170 QRS Duration:  90 QT Interval:  378 QTC Calculation: 430 R Axis:   32  Text Interpretation: Normal sinus rhythm Normal ECG When compared with ECG of 02-Nov-2023 12:46, PREVIOUS ECG IS PRESENT Confirmed by Large Madonna ([id number]) on 06/27/2024 2:02:08 PM  Echocardiogram: February 14, 2023: 1. Left ventricular ejection fraction, by estimation, is 55 to 60%. The left ventricle has normal function. The left ventricle has no regional wall motion abnormalities. Left ventricular diastolic parameters were normal. 2. Right ventricular systolic function is normal. The right ventricular size is normal. There is normal pulmonary artery systolic pressure. The estimated right ventricular systolic pressure is 20.8 mmHg. 3. The mitral valve is normal in structure. Trivial mitral valve regurgitation. 4. The aortic valve is tricuspid. There is mild thickening of the aortic valve. Aortic valve regurgitation is not visualized. Aortic valve sclerosis is present, with no evidence of aortic valve stenosis. 5. The inferior vena cava is normal in size with greater than 50% respiratory variability, suggesting right atrial pressure of 3 mmHg.  Limited echocardiogram 02/24/2023: Average global longitudinal strain -18.1% (normal limits).     Stress Testing: Regadenoson  Nuclear stress test 03/10/2023: There is a fixed mild defect in the septal region consistent with gut uptake artifact. No ischemia.  Overall LV systolic function is normal without regional wall motion abnormalities. Stress LV EF: 61%.  Nondiagnostic ECG stress. The heart rate response was consistent with Regadenoson .  No previous exam available for comparison. Low risk.    Cardiac monitor (Zio Patch): April 06, 2023-April 13, 2023 Dominant rhythm sinus, followed by tachycardia (burden 13%). Heart rate 50-200  bpm. Avg HR 85 bpm. No atrial fibrillation, ventricular tachycardia, high grade AV block,  pauses (3 seconds or longer). Total ventricular ectopic burden <1%. Total supraventricular ectopic burden <1%. Couple brief episodes of PSVT (asymptomatic).  Patient triggered events: 23. Underlying rhythm sinus without ectopy.   RADIOLOGY: NA  Risk Assessment/Calculations:   NA   Labs:       Latest Ref Rng & Units 02/01/2024    6:56 PM 11/02/2023   12:50 PM 05/21/2023    4:13 AM  CBC  WBC 4.0 - 10.5 K/uL 6.5  6.0  9.5   Hemoglobin 12.0 - 15.0 g/dL 86.4  87.3  89.3   Hematocrit 36.0 - 46.0 % 41.5  39.5  32.0   Platelets 150 - 400 K/uL 235  203  165        Latest Ref Rng & Units 02/01/2024    6:56 PM 11/02/2023   12:50 PM 05/21/2023    4:13 AM  BMP  Glucose 70 - 99 mg/dL 92  891  891   BUN 6 - 20 mg/dL 7  10  12    Creatinine 0.44 - 1.00 mg/dL 9.30  9.34  9.32   Sodium 135 - 145 mmol/L 142  142  138   Potassium 3.5 - 5.1 mmol/L 3.5  3.9  3.8   Chloride 98 - 111 mmol/L 102  104  103   CO2 22 - 32 mmol/L 31  28  27    Calcium 8.9 - 10.3 mg/dL 9.3  9.2  8.5       Latest Ref Rng & Units 02/01/2024    6:56 PM 11/02/2023   12:50 PM 05/21/2023    4:13 AM  CMP  Glucose 70 - 99 mg/dL 92  891  891   BUN 6 - 20 mg/dL 7  10  12    Creatinine 0.44 - 1.00 mg/dL 9.30  9.34  9.32   Sodium 135 - 145 mmol/L 142  142  138   Potassium 3.5 - 5.1 mmol/L 3.5  3.9  3.8   Chloride 98 - 111 mmol/L 102  104  103   CO2 22 - 32 mmol/L 31  28  27    Calcium 8.9 - 10.3 mg/dL 9.3  9.2  8.5   Total Protein 6.5 - 8.1 g/dL 7.2     Total Bilirubin 0.0 - 1.2 mg/dL 0.5     Alkaline Phos 38 - 126 U/L 82     AST 15 - 41 U/L 28     ALT 0 - 44 U/L 24       Lab Results  Component Value Date   CHOL 152 06/12/2021   HDL 56 06/12/2021   LDLCALC 79 06/12/2021   TRIG 85 06/12/2021   CHOLHDL 2.7 06/12/2021   No results for input(s): LIPOA in the last 8760 hours. No components found for: NTPROBNP No results for input(s): PROBNP in the last 8760 hours. No results for input(s): TSH in the last 8760  hours.  Physical Exam:    Today's Vitals   06/27/24 1346  BP: 112/72  Pulse: 80  Resp: 16  SpO2: 96%  Weight: 167 lb 3.2 oz (75.8 kg)  Height: 5' (1.524 m)   Body mass index is 32.65 kg/m. Wt Readings from Last 3 Encounters:  06/27/24 167 lb 3.2 oz (75.8 kg)  06/06/24 164 lb 3.2 oz (74.5 kg)  06/04/24 166 lb (75.3 kg)    Orthostatic vital signs: Supine 109/76, heart rate of 72. Sitting 105/71,  heart rate of 78. Standing 103/69 heart rate of 80  Physical Exam  Constitutional: No distress.  hemodynamically stable  Neck: No JVD present.  Cardiovascular: Normal rate, regular rhythm, S1 normal and S2 normal. Exam reveals no gallop, no S3 and no S4.  No murmur heard. Pulmonary/Chest: Effort normal and breath sounds normal. No stridor. She has no wheezes. She has no rales.  Musculoskeletal:        General: No edema.     Cervical back: Neck supple.  Skin: Skin is warm.     Impression & Recommendation(s):  Impression:   ICD-10-CM   1. Near syncope  R55 EKG 12-Lead    2. Orthostatic hypotension  I95.1        Recommendation(s):  As mentioned above patient was referred to the practice given her history of orthostatic hypotension and labile blood pressures.  Patient has had appropriate workup as outlined above.  She presents today for 1 year follow-up visit.  Clinically she denies any near syncopal or syncopal event since last office encounter.    Labs independently reviewed, multiple laboratory indices have improved since last office encounter.  Orthostatic vital signs negative.  Recommended to continue to consume 3 heart healthy meals per day, liberalize fluid as tolerated, compression stockings on as needed basis.  She is very well aware of changing positions slowly.  No additional cardiovascular testing is warranted at this time.  I will refer her back to her PCP for longitudinal care.  She is more than welcome to see me back if her cardiovascular need arises.   Patient is agreeable with the plan of care.  Orders Placed:  Orders Placed This Encounter  Procedures   EKG 12-Lead     Final Medication List:   No orders of the defined types were placed in this encounter.   There are no discontinued medications.   Current Outpatient Medications:    acetaminophen  (TYLENOL ) 500 MG tablet, Take 2 tablets (1,000 mg total) by mouth every 8 (eight) hours as needed for moderate pain, fever or headache., Disp: 30 tablet, Rfl: 0   albuterol  (PROVENTIL  HFA;VENTOLIN  HFA) 108 (90 Base) MCG/ACT inhaler, Inhale 2 puffs into the lungs every 4 (four) hours as needed for wheezing or shortness of breath. , Disp: , Rfl:    CALCIUM CITRATE PO, Take 600 mg by mouth daily., Disp: , Rfl:    cetirizine (ZYRTEC) 10 MG tablet, Take 10 mg by mouth daily., Disp: , Rfl:    Cholecalciferol (VITAMIN D ) 50 MCG (2000 UT) CAPS, Take 2,000 Units by mouth daily., Disp: , Rfl:    EPINEPHrine  0.3 mg/0.3 mL IJ SOAJ injection, Inject 0.3 mg into the muscle as needed for anaphylaxis., Disp: , Rfl:    famotidine  (PEPCID ) 40 MG tablet, Take 40 mg by mouth at bedtime., Disp: , Rfl:    FLAREX  0.1 % ophthalmic suspension, Apply 1 drop to eye 2 (two) times daily as needed (itching eyes)., Disp: , Rfl:    gabapentin  (NEURONTIN ) 300 MG capsule, Take 300 mg by mouth 2 (two) times daily., Disp: , Rfl:    Lifitegrast (XIIDRA) 5 % SOLN, Place 1 drop into both eyes as needed (dry eyes)., Disp: , Rfl:    methocarbamol  (ROBAXIN ) 500 MG tablet, Take 1 tablet (500 mg total) by mouth every 6 (six) hours as needed., Disp: 20 tablet, Rfl: 0   naratriptan  (AMERGE) 2.5 MG tablet, Take 1 tablet (2.5 mg total) by mouth as needed for migraine. Take one (1) tablet at  onset of headache; if returns or does not resolve, may repeat after 4 hours; do not exceed five (5) mg in 24 hours., Disp: 10 tablet, Rfl: 11   nortriptyline  (PAMELOR ) 25 MG capsule, Take 2 capsules (50 mg total) by mouth at bedtime., Disp: 180 capsule,  Rfl: 3   omeprazole (PRILOSEC) 40 MG capsule, Take 40 mg by mouth daily., Disp: , Rfl:    ondansetron  (ZOFRAN -ODT) 4 MG disintegrating tablet, Take 1 tablet (4 mg total) by mouth every 8 (eight) hours as needed., Disp: 20 tablet, Rfl: 6   Pediatric Multiple Vit-C-FA (MULTIVITAMIN ANIMAL SHAPES, WITH CA/FA,) with C & FA chewable tablet, Chew 1 tablet by mouth daily., Disp: , Rfl:    SYMBICORT 80-4.5 MCG/ACT inhaler, Inhale 1 puff into the lungs 2 (two) times daily., Disp: , Rfl:   Consent:   NA  Disposition:   As needed basis  Her questions and concerns were addressed to her satisfaction. She voices understanding of the recommendations provided during this encounter.    Signed, Madonna Michele HAS, [facility]  A Division of [facility] [address], [place], [place] [address]  06/27/2024 6:20 PM

## 2024-06-27 NOTE — Patient Instructions (Signed)
 Medication Instructions:  Continue same medications  Lab Work: None ordered  Testing/Procedures: None ordered  Follow-Up: At [facility], you and your health needs are our priority.  As part of our continuing mission to provide you with exceptional heart care, our providers are all part of one team.  This team includes your primary Cardiologist (physician) and Advanced Practice Providers or APPs (Physician Assistants and Nurse Practitioners) who all work together to provide you with the care you need, when you need it.  Your next appointment:  As Needed    Provider:  Dr.Tolia   We recommend signing up for the patient portal called MyChart.  Sign up information is provided on this After Visit Summary.  MyChart is used to connect with patients for Virtual Visits (Telemedicine).  Patients are able to view lab/test results, encounter notes, upcoming appointments, etc.  Non-urgent messages can be sent to your provider as well.   To learn more about what you can do with MyChart, go to [email or web address].

## 2024-07-05 ENCOUNTER — Encounter (INDEPENDENT_AMBULATORY_CARE_PROVIDER_SITE_OTHER): Payer: Self-pay | Admitting: Neurology

## 2024-07-05 DIAGNOSIS — R259 Unspecified abnormal involuntary movements: Secondary | ICD-10-CM

## 2024-07-05 DIAGNOSIS — G43709 Chronic migraine without aura, not intractable, without status migrainosus: Secondary | ICD-10-CM

## 2024-07-05 NOTE — Procedures (Signed)
 Clinical History:   This is a 53 y/o F who presents with recent onset of uncontrollable upper extremity movement, gait abnormality.   INTERMITTENT MONITORING with VIDEO TECHNICAL SUMMARY:  This AVEEG was performed using equipment provided by [employer] utilizing Bluetooth ( Trackit ) amplifiers with continuous EEGT attended video collection using encrypted remote transmission via [employer] secured cellular tower network with data rates for each AVEEG performed. This is a Therapist, music AVEEG, obtained, according to the 10-20 international electrode placement system, reformatted digitally into referential and bipolar montages. Data was acquired with a minimum of 21 bipolar connections and sampled at a minimum rate of 250 cycles per second per channel, maximum rate of 450 cycles per second per channel and two channels for EKG. The entire VEEG study was recorded through cable and or radio telemetry for subsequent analysis. Specified epochs of the AVEEG data were identified at the direction of the subject by the depression of a push button by the patient. Each patients event file included data acquired two minutes prior to the push button activation and continuing until two minutes afterwards. AVEEG files were reviewed on [employer] server, Licensed Software provided by [employer] with a digital high frequency filter set at 70 Hz and a low frequency filter set at 1 Hz with a paper speed of 32mm/s resulting in 10 seconds per digital page. This entire AVEEG was reviewed by the EEG Technologist. Random time samples, random sleep samples, clips, patient initiated push button files with included patient daily diary logs, EEG Technologist pruned data was reviewed and verified for accuracy and validity by the governing reading neurologist in full details. This AEEGV was fully compliant with all requirements for CPT 97500 for setup, patient education, take down and administered by an EEG  technologist.   Long-Term EEG with Video was monitored intermittently by a qualified EEG technologist for the entirety of the recording; quality check-ins were performed at a minimum of every two hours, checking and documenting real-time data and video to assure the integrity and quality of the recording (e.g., camera position, electrode integrity and impedance), and identify the need for maintenance. For intermittent monitoring, an EEG Technologist monitored no more than 12 patients concurrently. Diagnostic video was captured at least 80% of the time during the recording.   PATIENT EVENTS:  A button press or notation was not made during this study.   TECHNOLOGIST EVENTS:  No clear epileptiform activity was detected by the reviewing neurodiagnostic technologist during the recording for further evaluation.   TIME SAMPLES:  10-minutes of every two hours recorded are reviewed as random time samples.   SLEEP SAMPLES:  5-minutes of every 24 hour recorded sleep cycle are reviewed as random sleep samples.   AWAKE:  At maximal level of alertness, the posterior dominant background activity was continuous, reactive, low voltage rhythm of 9-10 Hz. This was symmetric, well-modulated, and attenuated with eye opening. Diffuse, symmetric, frontocentral beta range activity was present.   SLEEP:  N1 Sleep (Stage 1) was observed and characterized by the disappearance of alpha rhythm and the appearance of vertex activity.   N2 Sleep (Stage 2) was observed and characterized by vertex waves, K-complexes, and sleep spindles.  N3 (Stage 3) sleep was observed and characterized by high amplitude Delta activity of 20%.   REM sleep was observed.   EKG:  There were no arrhythmias or abnormalities noted during this recording.   Impression:  Normal EEG: awake and asleep   Clinical Correlation:  This  is a normal 24 hours ambulatory EEG tracing. No focal abnormalities or epileptiform discharges were seen. There  were no electrographic seizures noted. No events were captured during the recording. Please note a normal EEG does not exclude the diagnosis of epilepsy.   Yanira Tolsma, MD [facility]

## 2024-07-05 NOTE — Telephone Encounter (Signed)
 Report completed and it was normal.

## 2024-07-09 ENCOUNTER — Ambulatory Visit: Payer: Self-pay | Admitting: Neurology

## 2024-07-09 ENCOUNTER — Encounter: Payer: Self-pay | Admitting: Adult Health

## 2024-07-22 NOTE — Telephone Encounter (Signed)
 I returned phone call from patient to answering service after hours on 07/20/2024.  She stated that she was having increased tremulousness and fear that she is going to fall.  She was also having headaches and felt shaky.  She denied any vertigo, nausea, double vision or focal extremity weakness or numbness.  She has had similar symptoms in the past and had an extensive evaluation including an ambulatory EEG which was normal.  I advised the patient to rest and take it easy.  If symptoms did not get better or got worse she was advised to go to the ER.  She was advised to call the office next week to give us  an update as to how she was doing.  She voiced understanding

## 2024-07-23 NOTE — Telephone Encounter (Signed)
 Unclear cause of symptoms with extensive evaluation, was evaluated by Dr. Onita after I previously saw patient. [facility] as symptoms have persisted.

## 2024-07-23 NOTE — Telephone Encounter (Signed)
 Called to patient to follow up on on call made to Dr. Rosemarie. Patient reports that she fell again this morning and hit her on the night stand. I advised that anytime there is a head strike we recommend being evaluated and patient states she has also had multiple falls over the weekend. She denies having a migraine, but does report slurred speech/difficulty talking, off balance, weakness and tremors which all her baseline. I did advise if she felt different from her baseline to go to the ER . She is concerned about epilepsy, parkinsons or stroke. I advised on ER, and PCP for referral to be evaluated for other dx. Ambulatory EEG normal

## 2024-07-31 ENCOUNTER — Encounter: Payer: Self-pay | Admitting: Adult Health

## 2024-07-31 ENCOUNTER — Telehealth: Payer: Self-pay | Admitting: Adult Health

## 2024-07-31 DIAGNOSIS — R4781 Slurred speech: Secondary | ICD-10-CM

## 2024-07-31 DIAGNOSIS — R269 Unspecified abnormalities of gait and mobility: Secondary | ICD-10-CM

## 2024-07-31 DIAGNOSIS — R259 Unspecified abnormal involuntary movements: Secondary | ICD-10-CM

## 2024-07-31 NOTE — Telephone Encounter (Signed)
 Referral for neurology fax to [facility]. Phone: [phone], Fax: [phone]

## 2024-08-08 ENCOUNTER — Ambulatory Visit
Admission: RE | Admit: 2024-08-08 | Discharge: 2024-08-08 | Disposition: A | Source: Ambulatory Visit | Attending: Registered Nurse | Admitting: Registered Nurse

## 2024-08-08 DIAGNOSIS — Z1231 Encounter for screening mammogram for malignant neoplasm of breast: Secondary | ICD-10-CM

## 2024-08-20 NOTE — Telephone Encounter (Addendum)
 Patient said [facility] informed not accepting new patients at this time. Patient please include the reason for the referral on the referral because [facility] also said they could not find the reason for the neurology referral. Could you create a referral to [facility]?

## 2024-08-20 NOTE — Telephone Encounter (Signed)
 Renewed referral requesting evaluation by [facility]. Patient needs to be seen locally due to transportation.  Diagnostic codes on previously placed referral including involuntary movements, slurred speech and gait abnormality.  On prior visit referral comments, noted patient having upper extremity abnormal movements (jerking/shaking) of unclear  etiology after extensive evaluation without clear cause. Also added to updated referral comments lower extremity abnormal movements, slurred speech and gait abnormality.

## 2024-08-20 NOTE — Addendum Note (Signed)
 Addended by: WHITFIELD RAISIN L on: 08/20/2024 04:10 PM   Modules accepted: Orders

## 2024-08-21 NOTE — Telephone Encounter (Signed)
 Agree [facility] refer

## 2024-08-23 ENCOUNTER — Other Ambulatory Visit: Payer: Self-pay | Admitting: *Deleted

## 2024-08-23 DIAGNOSIS — R259 Unspecified abnormal involuntary movements: Secondary | ICD-10-CM

## 2024-08-23 DIAGNOSIS — R4781 Slurred speech: Secondary | ICD-10-CM

## 2024-08-23 DIAGNOSIS — R269 Unspecified abnormalities of gait and mobility: Secondary | ICD-10-CM

## 2024-08-23 NOTE — Telephone Encounter (Signed)
 Contacted patient, patient requested referral referral for neurology fax to [facility]. Phone: [phone], Fax: [phone]

## 2024-08-25 ENCOUNTER — Emergency Department (HOSPITAL_COMMUNITY)

## 2024-08-25 ENCOUNTER — Emergency Department (HOSPITAL_COMMUNITY)
Admission: EM | Admit: 2024-08-25 | Discharge: 2024-08-25 | Disposition: A | Discharge status: Home / Self Care | Attending: Emergency Medicine | Admitting: Emergency Medicine

## 2024-08-25 ENCOUNTER — Other Ambulatory Visit: Payer: Self-pay

## 2024-08-25 ENCOUNTER — Encounter (HOSPITAL_COMMUNITY): Payer: Self-pay | Admitting: *Deleted

## 2024-08-25 DIAGNOSIS — Y900 Blood alcohol level of less than 20 mg/100 ml: Secondary | ICD-10-CM | POA: Diagnosis not present

## 2024-08-25 DIAGNOSIS — M25552 Pain in left hip: Secondary | ICD-10-CM | POA: Diagnosis present

## 2024-08-25 DIAGNOSIS — R4781 Slurred speech: Secondary | ICD-10-CM | POA: Diagnosis not present

## 2024-08-25 DIAGNOSIS — W19XXXA Unspecified fall, initial encounter: Secondary | ICD-10-CM | POA: Insufficient documentation

## 2024-08-25 DIAGNOSIS — Z91010 Allergy to peanuts: Secondary | ICD-10-CM | POA: Insufficient documentation

## 2024-08-25 LAB — COMPREHENSIVE METABOLIC PANEL WITH GFR
ALT: 22 U/L (ref 0–44)
AST: 26 U/L (ref 15–41)
Albumin: 4.1 g/dL (ref 3.5–5.0)
Alkaline Phosphatase: 105 U/L (ref 38–126)
Anion gap: 8 (ref 5–15)
BUN: 8 mg/dL (ref 6–20)
CO2: 31 mmol/L (ref 22–32)
Calcium: 9.6 mg/dL (ref 8.9–10.3)
Chloride: 104 mmol/L (ref 98–111)
Creatinine, Ser: 0.81 mg/dL (ref 0.44–1.00)
GFR, Estimated: >60 mL/min (ref >60)
Glucose, Bld: 92 mg/dL (ref 70–99)
Potassium: 4.1 mmol/L (ref 3.5–5.1)
Sodium: 143 mmol/L (ref 135–145)
Total Bilirubin: 0.4 mg/dL (ref 0.0–1.2)
Total Protein: 7 g/dL (ref 6.5–8.1)

## 2024-08-25 LAB — URINALYSIS, ROUTINE W REFLEX MICROSCOPIC
Bacteria, UA: NONE SEEN
Bilirubin Urine: NEGATIVE
Glucose, UA: NEGATIVE mg/dL
Hgb urine dipstick: NEGATIVE
Ketones, ur: NEGATIVE mg/dL
Leukocytes,Ua: NEGATIVE
Nitrite: NEGATIVE
Protein, ur: NEGATIVE mg/dL
Specific Gravity, Urine: 1.001 — ABNORMAL LOW (ref 1.005–1.030)
pH: 7 (ref 5.0–8.0)

## 2024-08-25 LAB — CBC WITH DIFFERENTIAL/PLATELET
Abs Immature Granulocytes: 0.01 K/uL (ref 0.00–0.07)
Basophils Absolute: 0 K/uL (ref 0.0–0.1)
Basophils Relative: 1 %
Eosinophils Absolute: 0.1 K/uL (ref 0.0–0.5)
Eosinophils Relative: 2 %
HCT: 40.4 % (ref 36.0–46.0)
Hemoglobin: 12.9 g/dL (ref 12.0–15.0)
Immature Granulocytes: 0 %
Lymphocytes Relative: 30 %
Lymphs Abs: 1.9 K/uL (ref 0.7–4.0)
MCH: 31.2 pg (ref 26.0–34.0)
MCHC: 31.9 g/dL (ref 30.0–36.0)
MCV: 97.8 fL (ref 80.0–100.0)
Monocytes Absolute: 0.6 K/uL (ref 0.1–1.0)
Monocytes Relative: 10 %
Neutro Abs: 3.6 K/uL (ref 1.7–7.7)
Neutrophils Relative %: 57 %
Platelets: 232 K/uL (ref 150–400)
RBC: 4.13 MIL/uL (ref 3.87–5.11)
RDW: 13.2 % (ref 11.5–15.5)
WBC: 6.3 K/uL (ref 4.0–10.5)
nRBC: 0 % (ref 0.0–0.2)

## 2024-08-25 LAB — URINE DRUG SCREEN
Amphetamines: NEGATIVE
Barbiturates: NEGATIVE
Benzodiazepines: NEGATIVE
Cocaine: NEGATIVE
Fentanyl: NEGATIVE
Methadone Scn, Ur: NEGATIVE
Opiates: NEGATIVE
Tetrahydrocannabinol: NEGATIVE

## 2024-08-25 LAB — ETHANOL: Alcohol, Ethyl (B): <15 mg/dL (ref <15)

## 2024-08-25 MED ORDER — LORAZEPAM 0.5 MG PO TABS
0.5000 mg | ORAL_TABLET | Freq: Once | ORAL | Status: AC
  Administered 2024-08-25: 0.5 mg via ORAL
  Filled 2024-08-25: qty 1

## 2024-08-25 MED ORDER — LORAZEPAM 0.5 MG PO TABS
0.5000 mg | ORAL_TABLET | Freq: Once | ORAL | Status: DC
  Filled 2024-08-25: qty 1

## 2024-08-25 MED ORDER — GADOBUTROL 1 MMOL/ML IV SOLN
7.5000 mL | Freq: Once | INTRAVENOUS | Status: AC | PRN
  Administered 2024-08-25: 7.5 mL via INTRAVENOUS

## 2024-08-25 NOTE — ED Triage Notes (Signed)
 BIB daughter from home for frequent falls over last month. Last fall earlier this week. Also describes recent slurring of speech. Seen at today at virtual PC visit and instructed to come to ED. Verbalizes sx ongoing for > 1 month. Denies pain, sob, NVD, syncope or fever. Speech clear at this time. Denies injuries with falls. Denies blood thinners. Reports multiple bruises. Alert, NAD, calm, interactive.

## 2024-08-25 NOTE — ED Provider Notes (Signed)
  Accepted handoff at shift change from Bridget Stevens. Please see prior provider note for more detail.   Briefly: Patient is 53 y.o. presenting with multiple complaints. Patient is accompanied by her daughter who provides collateral information. Patient reports that for the past 2 to 3 months that she has had issues with her coordination and balance resulting in at least 5 falls, with the most recent fall being sometime this week with most of the impact occurring to her left hip. She also notes intermittent slurred speech, reports that this has been ongoing for the same length of time. She contacted her PCP today for a virtual visit and was instructed to come to the emergency department due to concern for stroke. She experiences intermittent uncontrollable shaking of her upper extremities as well. No history of stroke/TIA or other neurological conditions, no personal history of malignancy. History of headaches but no worse than her baseline. Denies vision changes.  Plan:  - dispo pending lab workup, imaging, and probable neurology consultation. - UA not concerning for infection.  CBC without leukocytosis or anemia.  CMP unremarkable.  EtOH within normal limits.  UDS negative. - MRI brain and hip x-ray are without acute abnormalities. - Consulted with neurologist on-call Dr. Lindzen who was able to review the patient's prior imaging/lab workup and imaging from today.  He recommends MRI thoracic spine, vitamin B1, vitamin E, and serum copper.  Vitamin/copper panels do not resolve for many days the patient can follow-up with her outpatient neurologist for these lab results if her thoracic MRI is reassuring today. - Thoracic MRI without acute abnormalities that would explain patient's symptoms.  Vitamin/copper labs pending. - Shared all results with patient.  Answered all questions.  Educated patient that she will need to follow-up with her neurologist pending the rest of the lab results.  Patient to also  follow-up with PCP.  Patient agreeable to plan. - Patient afebrile with stable vitals.  Provided with return precautions.  Discharged in good condition.      RISR  EDTHIS    Bridget Stevens Bridget Stevens 08/25/24 1849    Bridget Stevens, Bridget Stevens PARAS, MD 08/25/24 [id number]

## 2024-08-25 NOTE — Discharge Instructions (Signed)
 As discussed, you will need to have close follow-up with your outpatient neurologist.  Seek emergency care if experiencing any new or worsening symptoms.

## 2024-08-25 NOTE — ED Provider Notes (Signed)
 [place] EMERGENCY DEPARTMENT AT [facility] Provider Note   CSN: [id number] Arrival date & time: 08/25/24  1332     Patient presents with: Bridget Stevens is a 53 y.o. female.   53 year old female presenting with multiple complaints.  Patient is accompanied by her daughter who provides collateral information.  Patient reports that for the past 2 to 3 months that she has had issues with her coordination and balance resulting in at least 5 falls, with the most recent fall being sometime this week with most of the impact occurring to her left hip.  She also notes intermittent slurred speech, reports that this has been ongoing for the same length of time.  She contacted her PCP today for a virtual visit and was instructed to come to the emergency department due to concern for stroke. She experiences intermittent uncontrollable shaking of her upper extremities as well. No history of stroke/TIA or other neurological conditions, no personal history of malignancy.  History of headaches but no worse than her baseline.  Denies vision changes.   Fall       Prior to Admission medications   Medication Sig Start Date End Date Taking? Authorizing Provider  acetaminophen  (TYLENOL ) 500 MG tablet Take 2 tablets (1,000 mg total) by mouth every 8 (eight) hours as needed for moderate pain, fever or headache. 05/20/23   Hill, Valery GORMAN, PA-C  albuterol  (PROVENTIL  HFA;VENTOLIN  HFA) 108 (90 Base) MCG/ACT inhaler Inhale 2 puffs into the lungs every 4 (four) hours as needed for wheezing or shortness of breath.     [provider]  CALCIUM CITRATE PO Take 600 mg by mouth daily.    [provider]  cetirizine (ZYRTEC) 10 MG tablet Take 10 mg by mouth daily. 04/01/23   [provider]  Cholecalciferol (VITAMIN D ) 50 MCG (2000 UT) CAPS Take 2,000 Units by mouth daily.    [provider]  EPINEPHrine  0.3 mg/0.3 mL IJ SOAJ injection Inject 0.3 mg into the muscle as  needed for anaphylaxis. 11/21/19   [provider]  famotidine  (PEPCID ) 40 MG tablet Take 40 mg by mouth at bedtime. 03/14/22   [provider]  FLAREX  0.1 % ophthalmic suspension Apply 1 drop to eye 2 (two) times daily as needed (itching eyes). 02/19/22   [provider]  gabapentin  (NEURONTIN ) 300 MG capsule Take 300 mg by mouth 2 (two) times daily.    [provider]  Lifitegrast CARON) 5 % SOLN Place 1 drop into both eyes as needed (dry eyes).    [provider]  methocarbamol  (ROBAXIN ) 500 MG tablet Take 1 tablet (500 mg total) by mouth every 6 (six) hours as needed. 05/20/23   Hill, Valery GORMAN, PA-C  naratriptan  (AMERGE) 2.5 MG tablet Take 1 tablet (2.5 mg total) by mouth as needed for migraine. Take one (1) tablet at onset of headache; if returns or does not resolve, may repeat after 4 hours; do not exceed five (5) mg in 24 hours. 03/22/24   Whitfield Raisin, NP  nortriptyline  (PAMELOR ) 25 MG capsule Take 2 capsules (50 mg total) by mouth at bedtime. 06/04/24   Onita Duos, MD  omeprazole (PRILOSEC) 40 MG capsule Take 40 mg by mouth daily. 02/19/22   [provider]  ondansetron  (ZOFRAN -ODT) 4 MG disintegrating tablet Take 1 tablet (4 mg total) by mouth every 8 (eight) hours as needed. 06/04/24   Onita Duos, MD  Pediatric Multiple Vit-C-FA (MULTIVITAMIN ANIMAL SHAPES, WITH CA/FA,) with  C & FA chewable tablet Chew 1 tablet by mouth daily.    [provider]  SYMBICORT 80-4.5 MCG/ACT inhaler Inhale 1 puff into the lungs 2 (two) times daily. 08/22/23   [provider]    Allergies: Hydrocodone , Peanut-containing drug products, Shellfish allergy, Sulfa antibiotics, Wheat extract, Ibuprofen , Oxycodone , Oxycodone -acetaminophen , Triamcinolone, Penicillins, and Percocet [oxycodone -acetaminophen ]    Review of Systems  Updated Vital Signs  Vitals:   08/25/24 1335 08/25/24 1341  BP: (!) 152/98   Pulse: 84   Resp: 16   Temp: 97.9 F (36.6 C)    TempSrc: Oral   SpO2: 100%   Weight:  75.8 kg  Height:  5' (1.524 m)     Physical Exam Vitals and nursing note reviewed.  HENT:     Head: Normocephalic.  Eyes:     Extraocular Movements: Extraocular movements intact.  Cardiovascular:     Rate and Rhythm: Normal rate and regular rhythm.     Heart sounds: Normal heart sounds.  Pulmonary:     Effort: Pulmonary effort is normal.     Breath sounds: Normal breath sounds.  Musculoskeletal:     Cervical back: Normal range of motion.  Skin:    General: Skin is warm and dry.  Neurological:     Mental Status: She is alert and oriented to person, place, and time.     Sensory: No sensory deficit.     Comments: Mild slurring of speech with left sided mouth droop, unable to demonstrate eyebrow raise Normal cerebellar testing without ataxia, including finger to nose. Unable to assess gait as patient states she cannot walk secondary to balance issues. 5/5 strength against resistance of bilateral lower extremities Diminished grip strength bilaterally with 4/5 strength against resistance of bilateral upper extremities No appreciable sensory deficits of bilateral lower extremities, notes diminished sensation to fingers 3-5 of R hand     (all labs ordered are listed, but only abnormal results are displayed) Labs Reviewed  CBC WITH DIFFERENTIAL/PLATELET  COMPREHENSIVE METABOLIC PANEL WITH GFR  ETHANOL  URINE DRUG SCREEN  URINALYSIS, ROUTINE W REFLEX MICROSCOPIC    EKG: None  Radiology: No results found.   Procedures   Medications Ordered in the ED  LORazepam  (ATIVAN ) tablet 0.5 mg (0.5 mg Oral Given 08/25/24 1453)                                    Medical Decision Making This patient presents to the ED for concern of multiple complaints, this involves an extensive number of treatment options, and is a complaint that carries with it a high risk of complications and morbidity.  The differential diagnosis includes stroke/TIA,  myasthenia gravis, multiple sclerosis, intracranial mass, Parkinson's disease   Co morbidities that complicate the patient evaluation  History of migraines and previous L-spine/C-spine fusion surgeries    Additional history obtained:  Additional history obtained from record review External records from outside source obtained and reviewed including prior neurology notes   Lab Tests:  I Ordered, and personally interpreted labs.  The pertinent results include: CBC within normal limits. Remainder of labs pending at shift change.   Imaging Studies ordered:  I ordered imaging studies including MRI brain without contrast  I independently visualized and interpreted imaging which showed  - MRI: pending at shift change - L hip XR: pending at shift change I agree with the radiologist interpretation   Cardiac Monitoring: / EKG:  The patient was maintained on a cardiac monitor.  I personally viewed and interpreted the cardiac monitored which showed an underlying rhythm of: NSR   Problem List / ED Course / Critical interventions / Medication management   I ordered medication including ativan   for anxiety   Reevaluation of the patient after these medicines showed that the patient improved I have reviewed the patients home medicines and have made adjustments as needed   Test / Admission - Considered:  Physical exam notable as above, patient exhibiting a left sided mouth droop with difficulty demonstrating other facial expressions, like eyebrow raise.  She also has diminished strength to bilateral upper extremities and complains of some numbness to her right 3rd through 5th digits.  No obvious ataxia on exam however I was unable to assess her gait as she tells me she cannot walk due to balance and coordination issues. Followed by [facility] neurology.  Had a normal EEG in September with [facility] neurology. Had CTA of head/neck as well as brain MRI in September 2024 which were largely  unremarkable. Last neuro note per Dr. Onita reports that her symptoms may be secondary to extreme stress given largely unremarkable work-up.  Will repeat MRI brain given progressively worsening neurologic symptoms. Did not activate code stroke given the fact that symptoms have been ongoing for weeks-months, if not longer.   Patient handed off to oncoming EDP Bridget Mays PA-C at shift change pending results of labs and MRI/XR, see their note for assessment/plan/dispo. Patient would likely benefit from neurology consult while in the emergency department today.   Amount and/or Complexity of Data Reviewed Labs: ordered. Radiology: ordered.  Risk Prescription drug management.        Final diagnoses:  None    ED Discharge Orders     None          Glendia Rocky SAILOR, [place] 08/25/24 1504    Geraldene Glendia, MD 08/28/24 1317

## 2024-08-28 LAB — COPPER, SERUM: Copper: 116 ug/dL (ref 80–158)

## 2024-08-28 LAB — VITAMIN B1: Vitamin B1 (Thiamine): 191.2 nmol/L (ref 66.5–200.0)

## 2024-08-30 ENCOUNTER — Encounter: Payer: Self-pay | Admitting: Adult Health

## 2024-08-30 LAB — VITAMIN E
Vitamin E (Alpha Tocopherol): 13.6 mg/L (ref 7.0–25.1)
Vitamin E(Gamma Tocopherol): 0.9 mg/L (ref 0.5–5.5)

## 2024-08-30 NOTE — Telephone Encounter (Addendum)
 Dr. Onita- Pt went to [facility] ED 08/25/24. Their notes below from visit. Do you recommend anything prior to her appt with [facility]? Looks like you previously mentioned it was unclear what was causing sx and you were recommending [facility].

## 2024-09-10 ENCOUNTER — Telehealth: Payer: Self-pay | Admitting: Adult Health

## 2024-09-10 NOTE — Telephone Encounter (Signed)
 [place]  Nurse case manager called to request for Pt to be seen sooner . Pt is still having some issues and would like to discuss with   MD  Charmaine recommend to call Pt for APPT

## 2024-09-10 NOTE — Telephone Encounter (Signed)
 Per Harlene Please call and schedule patient with Dr. Onita for frequent fall concerns.

## 2024-09-10 NOTE — Telephone Encounter (Signed)
 Please advise on moving patient's appointment up from 03/13/25.  Patient has a referral to [facility], but he is still having issues with more frequent falls. Patient fell this morning and hit her back on cardboard, and almost fell in the basement the day before. She has not been taking her muscle relaxer as prescribed. She takes it twice a day only on her bad days and gabapentin  twice a day. She went to the ER last Saturday and they did not find anything abnormal.

## 2024-09-10 NOTE — Telephone Encounter (Signed)
 Will need to be seen by Dr. Onita if she wants to schedule follow up visit. We have done extensive evaluation without clear cause, hence the reason she is being referred to [facility]. She can be seen by Dr. Onita but unsure if she will have any other recommendations.

## 2024-09-11 NOTE — Telephone Encounter (Signed)
 After checking DPR, a detailed message was left asking pt to call office to make an appointment with Dr Onita re: concern for frequent falls.

## 2024-09-11 NOTE — Telephone Encounter (Signed)
 Phone room nevermind Dr.Yan sent patient a my chart message.

## 2024-09-11 NOTE — Telephone Encounter (Signed)
 Patient called to schedule appointment due to frequent falls with Dr. Onita. Dr. Onita does not have anything available, can you help? Inform patient will send to the nurse to check if she can help with scheduling an appointment and will call you back.

## 2024-09-11 NOTE — Telephone Encounter (Signed)
 I don't see anything either, please schedule a next slot and place on cancellation list.

## 2024-10-16 ENCOUNTER — Other Ambulatory Visit: Payer: Self-pay | Admitting: Adult Health

## 2025-03-13 ENCOUNTER — Ambulatory Visit: Admitting: Adult Health
